# Patient Record
Sex: Female | Born: 1950 | Race: White | Hispanic: No | Smoking: Never smoker
Health system: Southern US, Community
[De-identification: ages and names within clinical notes are randomized; demographics above are authoritative.]

## PROBLEM LIST (undated history)

## (undated) DIAGNOSIS — G4730 Sleep apnea, unspecified: Secondary | ICD-10-CM

## (undated) DIAGNOSIS — F329 Major depressive disorder, single episode, unspecified: Secondary | ICD-10-CM

## (undated) DIAGNOSIS — K5900 Constipation, unspecified: Secondary | ICD-10-CM

## (undated) DIAGNOSIS — I10 Essential (primary) hypertension: Secondary | ICD-10-CM

## (undated) DIAGNOSIS — M1990 Unspecified osteoarthritis, unspecified site: Secondary | ICD-10-CM

## (undated) DIAGNOSIS — Z860101 Personal history of adenomatous and serrated colon polyps: Secondary | ICD-10-CM

## (undated) DIAGNOSIS — F32A Depression, unspecified: Secondary | ICD-10-CM

## (undated) DIAGNOSIS — F419 Anxiety disorder, unspecified: Secondary | ICD-10-CM

## (undated) DIAGNOSIS — R569 Unspecified convulsions: Secondary | ICD-10-CM

## (undated) DIAGNOSIS — E7800 Pure hypercholesterolemia, unspecified: Secondary | ICD-10-CM

## (undated) DIAGNOSIS — Z86010 Personal history of colonic polyps: Secondary | ICD-10-CM

## (undated) DIAGNOSIS — T7840XA Allergy, unspecified, initial encounter: Secondary | ICD-10-CM

## (undated) DIAGNOSIS — I499 Cardiac arrhythmia, unspecified: Secondary | ICD-10-CM

## (undated) DIAGNOSIS — E039 Hypothyroidism, unspecified: Secondary | ICD-10-CM

## (undated) DIAGNOSIS — K829 Disease of gallbladder, unspecified: Secondary | ICD-10-CM

## (undated) DIAGNOSIS — M2550 Pain in unspecified joint: Secondary | ICD-10-CM

## (undated) DIAGNOSIS — K219 Gastro-esophageal reflux disease without esophagitis: Secondary | ICD-10-CM

## (undated) DIAGNOSIS — G4761 Periodic limb movement disorder: Secondary | ICD-10-CM

## (undated) DIAGNOSIS — R7303 Prediabetes: Secondary | ICD-10-CM

## (undated) DIAGNOSIS — E559 Vitamin D deficiency, unspecified: Secondary | ICD-10-CM

## (undated) DIAGNOSIS — E785 Hyperlipidemia, unspecified: Secondary | ICD-10-CM

## (undated) HISTORY — DX: Pain in unspecified joint: M25.50

## (undated) HISTORY — DX: Gastro-esophageal reflux disease without esophagitis: K21.9

## (undated) HISTORY — DX: Hypothyroidism, unspecified: E03.9

## (undated) HISTORY — DX: Periodic limb movement disorder: G47.61

## (undated) HISTORY — DX: Sleep apnea, unspecified: G47.30

## (undated) HISTORY — DX: Major depressive disorder, single episode, unspecified: F32.9

## (undated) HISTORY — DX: Unspecified osteoarthritis, unspecified site: M19.90

## (undated) HISTORY — DX: Personal history of colonic polyps: Z86.010

## (undated) HISTORY — DX: Disease of gallbladder, unspecified: K82.9

## (undated) HISTORY — PX: DILATION AND CURETTAGE OF UTERUS: SHX78

## (undated) HISTORY — DX: Personal history of adenomatous and serrated colon polyps: Z86.0101

## (undated) HISTORY — DX: Prediabetes: R73.03

## (undated) HISTORY — DX: Allergy, unspecified, initial encounter: T78.40XA

## (undated) HISTORY — DX: Constipation, unspecified: K59.00

## (undated) HISTORY — DX: Vitamin D deficiency, unspecified: E55.9

## (undated) HISTORY — PX: JOINT REPLACEMENT: SHX530

## (undated) HISTORY — DX: Hyperlipidemia, unspecified: E78.5

## (undated) HISTORY — PX: APPENDECTOMY: SHX54

## (undated) HISTORY — DX: Unspecified convulsions: R56.9

## (undated) HISTORY — PX: COLONOSCOPY: SHX174

## (undated) HISTORY — DX: Depression, unspecified: F32.A

## (undated) HISTORY — DX: Anxiety disorder, unspecified: F41.9

## (undated) HISTORY — PX: OSTEOTOMY PROXIMAL FEMORAL: SUR986

---

## 1976-01-08 HISTORY — PX: CHOLECYSTECTOMY: SHX55

## 1996-01-08 HISTORY — PX: JOINT REPLACEMENT: SHX530

## 1997-07-04 ENCOUNTER — Ambulatory Visit (HOSPITAL_COMMUNITY): Admission: RE | Admit: 1997-07-04 | Discharge: 1997-07-04 | Payer: Self-pay | Admitting: Orthopaedic Surgery

## 1997-12-28 ENCOUNTER — Ambulatory Visit (HOSPITAL_COMMUNITY): Admission: RE | Admit: 1997-12-28 | Discharge: 1997-12-28 | Payer: Self-pay | Admitting: Orthopedic Surgery

## 2002-08-02 ENCOUNTER — Encounter (INDEPENDENT_AMBULATORY_CARE_PROVIDER_SITE_OTHER): Payer: Self-pay

## 2002-08-02 ENCOUNTER — Ambulatory Visit (HOSPITAL_COMMUNITY): Admission: RE | Admit: 2002-08-02 | Discharge: 2002-08-02 | Payer: Self-pay | Admitting: *Deleted

## 2005-12-27 ENCOUNTER — Observation Stay (HOSPITAL_COMMUNITY): Admission: AD | Admit: 2005-12-27 | Discharge: 2005-12-28 | Payer: Self-pay | Admitting: Family Medicine

## 2005-12-27 ENCOUNTER — Ambulatory Visit: Payer: Self-pay | Admitting: Family Medicine

## 2006-07-21 ENCOUNTER — Encounter (INDEPENDENT_AMBULATORY_CARE_PROVIDER_SITE_OTHER): Payer: Self-pay | Admitting: *Deleted

## 2006-07-21 ENCOUNTER — Ambulatory Visit (HOSPITAL_COMMUNITY): Admission: RE | Admit: 2006-07-21 | Discharge: 2006-07-21 | Payer: Self-pay | Admitting: *Deleted

## 2008-05-16 ENCOUNTER — Encounter: Payer: Self-pay | Admitting: Cardiology

## 2008-06-04 ENCOUNTER — Encounter: Payer: Self-pay | Admitting: Cardiology

## 2008-07-04 ENCOUNTER — Ambulatory Visit: Payer: Self-pay | Admitting: Cardiology

## 2008-07-04 DIAGNOSIS — M7120 Synovial cyst of popliteal space [Baker], unspecified knee: Secondary | ICD-10-CM | POA: Insufficient documentation

## 2008-07-04 DIAGNOSIS — E785 Hyperlipidemia, unspecified: Secondary | ICD-10-CM | POA: Insufficient documentation

## 2008-07-04 DIAGNOSIS — I10 Essential (primary) hypertension: Secondary | ICD-10-CM | POA: Insufficient documentation

## 2008-07-04 DIAGNOSIS — R9431 Abnormal electrocardiogram [ECG] [EKG]: Secondary | ICD-10-CM | POA: Insufficient documentation

## 2009-02-14 ENCOUNTER — Encounter: Admission: RE | Admit: 2009-02-14 | Discharge: 2009-02-14 | Payer: Self-pay | Admitting: Emergency Medicine

## 2010-05-22 NOTE — Op Note (Signed)
Jennifer [name], Jennifer [name]         ACCOUNT NO.:  [id number]   MEDICAL RECORD NO.:  [id number]          PATIENT TYPE:  AMB   LOCATION:  ENDO                         FACILITY:  [facility]   PHYSICIAN:  Georgiana Spinner, M.D.    DATE OF BIRTH:  01-Nov-1950   DATE OF PROCEDURE:  07/21/2006  DATE OF DISCHARGE:                               OPERATIVE REPORT   PROCEDURE:  Colonoscopy.   ANESTHESIA:  Fentanyl 100 mcg, Versed 8 mg.   DESCRIPTION OF PROCEDURE:  With the patient mildly sedated in the left  lateral decubitus position,  the Pentax videoscopic colonoscope was  inserted in the rectum and passed under direct vision to the cecum,  identified by ileocecal valve and appendiceal orifice, both of which  were photographed from this point.  Colonoscope was slowly withdrawn  taking circumferential views of the colonic mucosa, stopping only first  at the ascending colon where a small polyp was seen, photographed and  removed using snare cautery technique setting of 20/150 blended current.  Tissue was retrieved by suctioning into the endoscope through the tissue  trap.  We next stopped the hepatic flexure and transverse colon side  where two small polyps were seen.  One was removed using snare cautery  technique.  The other with hot biopsy forceps technique with the same  previous setting.  All tissue was retrieved for pathology.  We next  stopped at 40 cm from anal verge at which point two more polyps were  seen and one was removed using snare cautery technique setting with hot  biopsy forceps technique, again with the same setting.  All tissue was  again retrieved and placed into the third specimen container.  Then we  withdrew all the way to the rectum which appeared normal.  Showed  hemorrhoids on retroflexed view.  The endoscope was straightened and  withdrawn.  The patient's vital signs, pulse oximeter remained stable.  The patient tolerated the procedure well without apparent  complications.   FINDINGS:  Two polyps at 40 cm from anal verge, two polyps at the  hepatic flexure and transverse colon side and a polyp of the ascending  colon, all removed.  Await biopsy report.  The patient will call me for  results and follow-up with me as an outpatient.           ______________________________  Georgiana Spinner, M.D.     GMO/MEDQ  D:  07/21/2006  T:  07/21/2006  Job:  [id number]   cc:   Brett Canales A. Cleta Alberts, M.D.  Fax: [phone]

## 2010-05-25 NOTE — H&P (Signed)
NAMEMACKENSI, MAHADEO         ACCOUNT NO.:  [id number]   MEDICAL RECORD NO.:  [id number]          PATIENT TYPE:  INP   LOCATION:  5708                         FACILITY:  [facility]   PHYSICIAN:  Rolm Gala, M.D.    DATE OF BIRTH:  [birth year]   DATE OF ADMISSION:  12/27/2005  DATE OF DISCHARGE:                              HISTORY & PHYSICAL   No dictation for this report.      Rolm Gala, M.D.     Bennetta Laos  D:  12/27/2005  T:  12/28/2005  Job:  [id number]

## 2010-05-25 NOTE — Op Note (Signed)
   NAME:  Jennifer [name], Jennifer [name]                   ACCOUNT NO.:  [id number]   MEDICAL RECORD NO.:  [id number]                   PATIENT TYPE:  AMB   LOCATION:  ENDO                                 FACILITY:  [facility]   PHYSICIAN:  Georgiana Spinner, M.D.                 DATE OF BIRTH:  07-Nov-1950   DATE OF PROCEDURE:  DATE OF DISCHARGE:                                 OPERATIVE REPORT   PROCEDURE:  Colonoscopy with polypectomy.   INDICATION:  Polyp.   ANESTHESIA:  Demerol 75 mg, Versed 8 mg.   DESCRIPTION OF PROCEDURE:  With the patient mildly sedated in the left  lateral decubitus position, the Olympus videoscopic colonoscope was inserted  in the rectum and passed under direct vision to the cecum identified by the  ileocecal valve and appendiceal orifice, both of which were photographed.  From this point, the colonoscope was slowly withdrawn, taking  circumferential views of the colonic mucosa, stopping to photograph an AVM  that was small in two to three folds removed from the ileocecal valve and a  second AVM was seen in the sigmoid colon and this was photographed as well.  Also, in the descending colon, there was a polyp seen, it was photographed  and it was removed using the Argon photocoagulator.  Tissue was slowly  retrieved for pathology and hemostasis was noted.  There was no bleeding  seen and the endoscope was pulled all the way back to the rectum which  appeared normal except for internal hemorrhoids.  On retroflex view, the  endoscope was straightened and withdrawn.  The patient's vital signs and  pulse oximetry remained stable and the patient tolerated the procedure well  without apparent complications.   FINDINGS:  1. 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  2. Polyp of the descending colon, removed.  3. Internal hemorrhoids.   PLAN:  1. Await pathology report and the patient will call me for the results and    follow up with me as an outpatient.  2. Avoid aspirin for two weeks.                                               Georgiana Spinner, M.D.    GMO/MEDQ  D:  08/02/2002  T:  08/02/2002  Job:  [id number]   cc:   Brett Canales A. Cleta Alberts, M.D.  [address]  [place]  [place] [address]  Fax: [phone]

## 2010-05-25 NOTE — Discharge Summary (Signed)
Jennifer [name], Jennifer [name]         ACCOUNT NO.:  [id number]   MEDICAL RECORD NO.:  [id number]          PATIENT TYPE:  INP   LOCATION:  5708                         FACILITY:  [facility]   PHYSICIAN:  Norton Blizzard, M.D.    DATE OF BIRTH:  1950-10-23   DATE OF ADMISSION:  12/27/2005  DATE OF DISCHARGE:  12/28/2005                               DISCHARGE SUMMARY   DISCHARGE DIAGNOSES:  1. Gastroenteritis.  2. Dehydration.  3. Hypocalcemia.  4. Hypercholesterolemia.  5. Depression.  6. Hypokalemia.  7. Mild anemia.   CONSULTATIONS:  None.   PROCEDURES:  None.   DISCHARGE LABS:  Rotavirus negative.  Lipase 18, potassium 3.2, sodium  137, chloride 109, bicarb 23, glucose 89, BUN 16, creatinine 0.6,  albumin 2.3, calcium 7.4, phosphorous 2.7.  White blood cell count 8,  hemoglobin 11.8, platelets 208.  MCV was 89.   HOSPITAL COURSE:  1. Nausea and vomiting likely secondary to gastroenteritis from the      chicken salad that she had eaten the night before.  She had      persistent nausea and vomiting and diarrhea on day of admit, and      was then brought to the hospital.  She did not have any bloody      stool or emesis.  She did have abdominal pain but was afebrile,      with a T-max of 100.2.  She was made n.p.o.  We advanced her diet      as tolerated to clears on discharge, and she was tolerating this      without nausea.  She was given IV fluids, 3 liters at outside      hospital, and 4 liters here and we continued IV fluids here.  She      was given Zofran and Phenergan p.r.n..  2. Dehydration secondary to #1.  She is status post 4 liters of      lactated Ringer and then was started on 200 cc per hour of this      fluid.  She was tachycardic on discharge but was tolerating p.o.      without any difficulty.  3. Hypocalcemia.  This is correct for albumin, so do not feel there is      any further workup necessary.  Would consider working up her low      albumin, however.  4.  Dyslipidemia and depression.  We held her Vytorin and Lexapro while      in the hospital due to emesis and she was able to start this on      discharge.   DISCHARGE MEDICATIONS AND INSTRUCTIONS:  1. Phenergan 25 mg p.o. q.6h. p.r.n. nausea.  2. Lexapro 40 mg p.o. daily.  3. Vytorin 40 mg p.o. daily.   Patient is to return if she cannot keep any food or liquids down at all,  if she develops new abdominal pain, fevers, or any other concerns.   DISCHARGE CONDITION:  Good, stable.   FOLLOWUP:  Patient will follow up with Dr. Juline Patch, her primary care  physician, as needed.   ISSUES FOR FOLLOWUP:  1. Hypokalemia  likely secondary to emesis but would recheck a BMP to      evaluate.  2. Anemia.  This is slightly low with a normal MCV.  Consider whether      this needs to be followed up as well.  3. Consider why her albumin is low.           ______________________________  Norton Blizzard, M.D.     SH/MEDQ  D:  12/29/2005  T:  12/30/2005  Job:  [id number]   cc:   Dr. Juline Patch.

## 2010-11-26 ENCOUNTER — Encounter (HOSPITAL_COMMUNITY): Payer: Self-pay | Admitting: *Deleted

## 2010-12-04 ENCOUNTER — Encounter (HOSPITAL_COMMUNITY): Payer: Self-pay | Admitting: Pharmacist

## 2010-12-10 ENCOUNTER — Other Ambulatory Visit: Payer: Self-pay | Admitting: Obstetrics & Gynecology

## 2010-12-17 MED ORDER — GENTAMICIN SULFATE 40 MG/ML IJ SOLN
INTRAVENOUS | Status: DC
  Filled 2010-12-17: qty 2.75

## 2010-12-17 MED ORDER — GENTAMICIN SULFATE 40 MG/ML IJ SOLN: INTRAVENOUS | Status: DC

## 2010-12-17 NOTE — H&P (Addendum)
Jennifer [name] is an 60 y.o. female. G1P1. Menopausalx 2 yrs, now with irreg bleeding and sono noted large submucosal myoma besides a few intramural myoma.  Plan to proceed with more conservative surgery with hysteroscopic myomectomy rather than hysterectomy. Endometrial bx is benign. Pap in 2010 nl, all nl in past.  Mammogram in 2010 nl, plans to repeat after current problem resolved.   Medical Hx:  Depression Dyslipidemia Obesity  Past Surgical History  Procedure Date  . Joint replacement 1998    right knee  . Cholecystectomy 1978  . Cesarean section   Appendicectomy  Family Hx:  Stomach Ca, lung ca, HTN, DM, CAD.  Social History:  reports that she has never smoked. She does not have any smokeless tobacco history on file. She reports that she does not drink alcohol or use illicit drugs.  Allergies:  Allergies  Allergen Reactions  . Triple Antibiotic Swelling    Ophthalmic only(eye swelling)  . Codeine Rash  . Penicillins Rash  . Piroxicam Rash  . Sulfonamide Derivatives Rash    No prescriptions prior to admission    Review of Systems  Constitutional: Negative for fever.  Respiratory: Negative for cough.   Cardiovascular: Negative for chest pain.  Gastrointestinal: Negative for constipation.  Neurological: Negative for headaches.  Psychiatric/Behavioral: Negative for depression.    Height 5\' 3"  (1.6 m), weight 162 lb (73.483 kg). Physical Exam   A&O x 3, no acute distress. Pleasant HEENT neg, no thyromegaly Lungs CTA bilat CV RRR, S1S2 normal Abdo soft, non tender, non acute Extr no edema/ tenderness Pelvic in office - fibroid uterus. Normal ovaries on sono.   No results found for this or any previous visit (from the past 24 hour(s)).  No results found.  Assessment/Plan: Postmenopausal bleeding, submucosal myoma. Here for hysteroscopic resection. Risks/complications including infection/ bleeding/ uterine perforation/ incomplete resection/ fluid  retention etc reviewed, patient understands and agrees.   Danila Eddie R 12/17/2010, 5:04 PM  Update on H&P-->  Pt evaluated in pre-op, H&P reviewed, no changes. Surgery plan discussed, understands and agrees.  V.Lavender Stanke, MD

## 2010-12-18 ENCOUNTER — Encounter (HOSPITAL_COMMUNITY)
Admission: RE | Disposition: A | Discharge status: Home / Self Care | Payer: Self-pay | Source: Ambulatory Visit | Attending: Obstetrics & Gynecology

## 2010-12-18 ENCOUNTER — Encounter (HOSPITAL_COMMUNITY): Payer: Self-pay | Admitting: *Deleted

## 2010-12-18 ENCOUNTER — Ambulatory Visit (HOSPITAL_COMMUNITY)
Admission: RE | Admit: 2010-12-18 | Discharge: 2010-12-18 | Disposition: A | Discharge status: Home / Self Care | Payer: BC Managed Care – PPO | Source: Ambulatory Visit | Attending: Obstetrics & Gynecology | Admitting: Obstetrics & Gynecology

## 2010-12-18 ENCOUNTER — Other Ambulatory Visit: Payer: Self-pay | Admitting: Obstetrics & Gynecology

## 2010-12-18 ENCOUNTER — Encounter (HOSPITAL_COMMUNITY): Payer: Self-pay | Admitting: Certified Registered Nurse Anesthetist

## 2010-12-18 ENCOUNTER — Ambulatory Visit (HOSPITAL_COMMUNITY): Payer: BC Managed Care – PPO | Admitting: Certified Registered Nurse Anesthetist

## 2010-12-18 DIAGNOSIS — D250 Submucous leiomyoma of uterus: Secondary | ICD-10-CM | POA: Diagnosis present

## 2010-12-18 DIAGNOSIS — N950 Postmenopausal bleeding: Secondary | ICD-10-CM | POA: Insufficient documentation

## 2010-12-18 LAB — BASIC METABOLIC PANEL
BUN: 14 mg/dL (ref 6–23)
GFR calc Af Amer: >90 mL/min (ref >90)
GFR calc non Af Amer: >90 mL/min (ref >90)
Sodium: 136 mEq/L (ref 135–145)

## 2010-12-18 LAB — CBC
HCT: 38.7 % (ref 36.0–46.0)
MCV: 88.8 fL (ref 78.0–100.0)
RBC: 4.36 MIL/uL (ref 3.87–5.11)

## 2010-12-18 SURGERY — DILATATION & CURETTAGE/HYSTEROSCOPY WITH VERSAPOINT RESECTION
Anesthesia: General | Site: Uterus | Wound class: Clean Contaminated

## 2010-12-18 MED ORDER — DEXAMETHASONE SODIUM PHOSPHATE 10 MG/ML IJ SOLN
INTRAMUSCULAR | Status: DC | PRN
  Administered 2010-12-18: 10 mg via INTRAVENOUS

## 2010-12-18 MED ORDER — PROPOFOL 10 MG/ML IV EMUL
INTRAVENOUS | Status: DC | PRN
  Administered 2010-12-18: 200 mg via INTRAVENOUS

## 2010-12-18 MED ORDER — ONDANSETRON HCL 4 MG/2ML IJ SOLN
INTRAMUSCULAR | Status: DC | PRN
  Administered 2010-12-18: 4 mg via INTRAVENOUS

## 2010-12-18 MED ORDER — EPHEDRINE SULFATE 50 MG/ML IJ SOLN
INTRAMUSCULAR | Status: DC | PRN
  Administered 2010-12-18 (×3): 10 mg via INTRAVENOUS

## 2010-12-18 MED ORDER — SODIUM CHLORIDE 0.9 % IR SOLN
Status: DC | PRN
  Administered 2010-12-18 (×4): 3000 mL

## 2010-12-18 MED ORDER — CLINDAMYCIN PHOSPHATE 600 MG/50ML IV SOLN: INTRAVENOUS | Status: DC | PRN

## 2010-12-18 MED ORDER — TRAMADOL HCL 50 MG PO TABS: ORAL_TABLET | ORAL | Status: DC

## 2010-12-18 MED ORDER — LIDOCAINE HCL 1 % IJ SOLN
INTRAMUSCULAR | Status: DC | PRN
  Administered 2010-12-18: 20 mL

## 2010-12-18 MED ORDER — KETOROLAC TROMETHAMINE 30 MG/ML IJ SOLN
INTRAMUSCULAR | Status: DC | PRN
  Administered 2010-12-18: 30 mg via INTRAVENOUS

## 2010-12-18 MED ORDER — FENTANYL CITRATE 0.05 MG/ML IJ SOLN
INTRAMUSCULAR | Status: DC | PRN
  Administered 2010-12-18: 50 ug via INTRAVENOUS

## 2010-12-18 MED ORDER — LIDOCAINE HCL (CARDIAC) 20 MG/ML IV SOLN
INTRAVENOUS | Status: DC | PRN
  Administered 2010-12-18: 100 mg via INTRAVENOUS

## 2010-12-18 MED ORDER — GENTAMICIN SULFATE 40 MG/ML IJ SOLN
INTRAVENOUS | Status: DC | PRN
  Administered 2010-12-18: 100 mL via INTRAVENOUS

## 2010-12-18 MED ORDER — LACTATED RINGERS IV SOLN
INTRAVENOUS | Status: DC
  Administered 2010-12-18 (×2): via INTRAVENOUS

## 2010-12-18 SURGICAL SUPPLY — 16 items
CANISTER SUCTION 2500CC (MISCELLANEOUS) ×11 IMPLANT
CATH ROBINSON RED A/P 16FR (CATHETERS) ×3 IMPLANT
CLOTH BEACON ORANGE TIMEOUT ST (SAFETY) ×3 IMPLANT
CONTAINER PREFILL 10% NBF 60ML (FORM) ×4 IMPLANT
ELECT REM PT RETURN 9FT ADLT (ELECTROSURGICAL) ×3
ELECTRODE REM PT RTRN 9FT ADLT (ELECTROSURGICAL) ×2 IMPLANT
ELECTRODE ROLLER VERSAPOINT (ELECTRODE) ×2 IMPLANT
ELECTRODE RT ANGLE VERSAPOINT (CUTTING LOOP) IMPLANT
GLOVE BIO SURGEON STRL SZ7 (GLOVE) ×6 IMPLANT
GLOVE BIOGEL PI IND STRL 7.0 (GLOVE) ×2 IMPLANT
GLOVE BIOGEL PI INDICATOR 7.0 (GLOVE) ×1
GOWN PREVENTION PLUS LG XLONG (DISPOSABLE) ×4 IMPLANT
GOWN STRL REIN XL XLG (GOWN DISPOSABLE) ×3 IMPLANT
PACK HYSTEROSCOPY LF (CUSTOM PROCEDURE TRAY) ×3 IMPLANT
TOWEL OR 17X24 6PK STRL BLUE (TOWEL DISPOSABLE) ×6 IMPLANT
WATER STERILE IRR 1000ML POUR (IV SOLUTION) ×3 IMPLANT

## 2010-12-18 NOTE — Transfer of Care (Signed)
Immediate Anesthesia Transfer of Care Note  Patient: Jennifer [name]  Procedure(s) Performed:  DILATATION & CURETTAGE/HYSTEROSCOPY WITH VERSAPOINT RESECTION  Patient Location: PACU  Anesthesia Type: General  Level of Consciousness: awake, alert  and oriented  Airway & Oxygen Therapy: Patient Spontanous Breathing  Post-op Assessment: Report given to PACU RN  Post vital signs: Reviewed and stable  Complications: No apparent anesthesia complications

## 2010-12-18 NOTE — Anesthesia Postprocedure Evaluation (Signed)
  Anesthesia Post-op Note  Patient: Jennifer [name]  Procedure(s) Performed:  DILATATION & CURETTAGE/HYSTEROSCOPY WITH VERSAPOINT RESECTION  Patient Location: PACU  Anesthesia Type: General  Level of Consciousness: awake, alert  and oriented  Airway and Oxygen Therapy: Patient Spontanous Breathing  Post-op Pain: none  Post-op Assessment: Post-op Vital signs reviewed, Patient's Cardiovascular Status Stable, Respiratory Function Stable, Patent Airway, No signs of Nausea or vomiting, Adequate PO intake and Pain level controlled  Post-op Vital Signs: Reviewed and stable  Complications: No apparent anesthesia complications

## 2010-12-18 NOTE — Op Note (Signed)
Preoperative diagnosis: Submucosal myoma, postmenopausal bleeding.  Postop diagnosis: as above.  Procedure: Hysteroscopic resection of submucosal myoma with Versapoint vaporizer.  Anesthesia: General Surgeon: Shea Evans, MD  Assistant:  none IV fluids: LR 1500 cc  Estimated blood loss 50 cc Saline (hysteroscopic irrigation) deficit : 515 cc   Urine output: straight catheter preop   Complications none  Condition stable  Disposition PACU  Specimen: few chips of vaporized myoma.   Procedure  Indication: Postmenopausal bleeding, fibroid uterus with large 3.5 cm submucosal myoma. Endometrial biopsy was benign in office.  Patient was counseled on risks/ complications including infection, bleeding, damage to internal organs, fluid deficit with intravascular absorption and need to terminate procedure with incomplete resection. she understood and agrees, gave informed written consent.  Patient was brought to the operating room with IV running. Time out was carried out. She received preop Gentamicin and Clindamycin. She underwent general anesthesia without complications. She was given dorsolithotomy position. Parts were prepped and draped in standard fashion. Bladder was catheterized once. Bimanual exam revealed uterus to be retroverted and 10 wks, irregular from fibroids. Speculum was placed and cervix was grasped with single-tooth tenaculum. Cervical block with 20 cc 1% plain Xylocaine given. The uterus was sounded to 10 cm. Cervical os was dilated to 27 [place] dilator. Versapoint Hysteroscope was introduced in the uterine cavity under vision, using Saline for irrigation.  Findings: Large 3.5 cm submucosal myoma noted filling the entire uterine cavity arising from right lateral/fundal aspect. Endometrium appeared normal.  Hysteroscopic vaporization of myoma was performed with Versapoint using vaporising loop in stepwise fashion. Few chips of myoma were removed and sent to pathology. Hysteroscopy at  the end revealed normal endometrial cavity and normal tubal ostii. Bleeding vessels at myoma base were cauterized with excellent hemostasis noted under low pressure.  Fluid deficit 515 cc of normal saline. All instruments were removed and cavity was emptied of chips with gentle curettage.   All counts are correct x2. No complications. Patient was made supine, reversed from anesthesia and brought to the recovery room in stable condition.  Patient will be discharged home today. Discharge meds Tramadol. Follow up in 2 weeks in office. Warning signs of infection and excessive bleeding reviewed.   V.Sharvi Mooneyhan, MD.

## 2010-12-18 NOTE — Anesthesia Preprocedure Evaluation (Signed)
Anesthesia Evaluation  Patient identified by MRN, date of birth, ID band Patient awake    Reviewed: Allergy & Precautions, H&P , NPO status , Patient's Chart, lab work & pertinent test results  Airway Mallampati: III TM Distance: >3 FB Neck ROM: full    Dental No notable dental hx. (+) Teeth Intact   Pulmonary neg pulmonary ROS,  clear to auscultation  Pulmonary exam normal       Cardiovascular hypertension, neg cardio ROS regular Normal    Neuro/Psych Negative Neurological ROS  Negative Psych ROS   GI/Hepatic negative GI ROS, Neg liver ROS,   Endo/Other  Negative Endocrine ROS  Renal/GU negative Renal ROS  Genitourinary negative   Musculoskeletal negative musculoskeletal ROS (+)   Abdominal Normal abdominal exam  (+)   Peds  Hematology negative hematology ROS (+)   Anesthesia Other Findings   Reproductive/Obstetrics negative OB ROS                           Anesthesia Physical Anesthesia Plan  ASA: II  Anesthesia Plan: General LMA and General   Post-op Pain Management:    Induction:   Airway Management Planned:   Additional Equipment:   Intra-op Plan:   Post-operative Plan:   Informed Consent: I have reviewed the patients History and Physical, chart, labs and discussed the procedure including the risks, benefits and alternatives for the proposed anesthesia with the patient or authorized representative who has indicated his/her understanding and acceptance.   Dental Advisory Given  Plan Discussed with: Anesthesiologist, CRNA and Surgeon  Anesthesia Plan Comments:         Anesthesia Quick Evaluation

## 2010-12-18 NOTE — Anesthesia Procedure Notes (Addendum)
Performed by: [employer], Akylah Hascall MARIE   Procedure Name: LMA Insertion Date/Time: 12/18/2010 10:07 AM Performed by: Roni Friberg MARIE Pre-anesthesia Checklist: Patient identified, Patient being monitored, Emergency Drugs available and Suction available Patient Re-evaluated:Patient Re-evaluated prior to inductionOxygen Delivery Method: Circle System Utilized Preoxygenation: Pre-oxygenation with 100% oxygen Intubation Type: IV induction LMA: LMA inserted LMA Size: 4.0 Number of attempts: 1 Placement Confirmation: positive ETCO2 and breath sounds checked- equal and bilateral

## 2011-06-18 ENCOUNTER — Encounter: Payer: Self-pay | Admitting: Emergency Medicine

## 2011-06-18 ENCOUNTER — Ambulatory Visit (INDEPENDENT_AMBULATORY_CARE_PROVIDER_SITE_OTHER): Payer: BC Managed Care – PPO | Admitting: Emergency Medicine

## 2011-06-18 VITALS — BP 142/85 | HR 72 | Temp 97.0°F | Resp 16 | Ht 64.0 in | Wt 172.0 lb

## 2011-06-18 DIAGNOSIS — K130 Diseases of lips: Secondary | ICD-10-CM

## 2011-06-18 DIAGNOSIS — F329 Major depressive disorder, single episode, unspecified: Secondary | ICD-10-CM

## 2011-06-18 DIAGNOSIS — F32A Depression, unspecified: Secondary | ICD-10-CM

## 2011-06-18 DIAGNOSIS — Z23 Encounter for immunization: Secondary | ICD-10-CM

## 2011-06-18 DIAGNOSIS — E785 Hyperlipidemia, unspecified: Secondary | ICD-10-CM

## 2011-06-18 DIAGNOSIS — E782 Mixed hyperlipidemia: Secondary | ICD-10-CM

## 2011-06-18 DIAGNOSIS — Z0000 Encounter for general adult medical examination without abnormal findings: Secondary | ICD-10-CM

## 2011-06-18 DIAGNOSIS — E559 Vitamin D deficiency, unspecified: Secondary | ICD-10-CM

## 2011-06-18 LAB — CBC WITH DIFFERENTIAL/PLATELET
Basophils Absolute: 0 10*3/uL (ref 0.0–0.1)
Basophils Relative: 1 % (ref 0–1)
Eosinophils Absolute: 0.1 10*3/uL (ref 0.0–0.7)
MCH: 30.5 pg (ref 26.0–34.0)
MCHC: 34.5 g/dL (ref 30.0–36.0)
Neutrophils Relative %: 69 % (ref 43–77)
Platelets: 295 10*3/uL (ref 150–400)
RDW: 13.1 % (ref 11.5–15.5)

## 2011-06-18 LAB — POCT UA - MICROSCOPIC ONLY: Crystals, Ur, HPF, POC: NEGATIVE

## 2011-06-18 LAB — COMPREHENSIVE METABOLIC PANEL
ALT: 17 U/L (ref 0–35)
Alkaline Phosphatase: 82 U/L (ref 39–117)
Glucose, Bld: 81 mg/dL (ref 70–99)
Sodium: 141 mEq/L (ref 135–145)
Total Bilirubin: 0.7 mg/dL (ref 0.3–1.2)
Total Protein: 7.3 g/dL (ref 6.0–8.3)

## 2011-06-18 LAB — LIPID PANEL: HDL: 40 mg/dL (ref >39)

## 2011-06-18 LAB — TSH: TSH: 2.894 u[IU]/mL (ref 0.350–4.500)

## 2011-06-18 LAB — POCT URINALYSIS DIPSTICK
Ketones, UA: NEGATIVE
Leukocytes, UA: NEGATIVE
Nitrite, UA: NEGATIVE
Protein, UA: NEGATIVE

## 2011-06-18 MED ORDER — BETAMETHASONE DIPROPIONATE 0.05 % EX CREA: TOPICAL_CREAM | Freq: Two times a day (BID) | CUTANEOUS | Status: DC

## 2011-06-18 MED ORDER — TETANUS-DIPHTH-ACELL PERTUSSIS 5-2.5-18.5 LF-MCG/0.5 IM SUSP: 0.5000 mL | Freq: Once | INTRAMUSCULAR | Status: DC

## 2011-06-18 NOTE — Progress Notes (Signed)
@[email or web address] @  Patient ID: Jennifer [name] MRN: [id number], DOB: Mar 14, 1950, 61 y.o. Date of Encounter: 06/18/2011, 9:53 AM  Primary Physician: No primary provider on file.  Chief Complaint: Physical (CPE)  HPI: 61 y.o. y/o female with history of noted below here for CPE.  Doing well. No issues/complaints.  LMP:  Pap: MMG: Review of Systems:  Consitutional: No fever, chills, fatigue, night sweats, lymphadenopathy, or weight changes. Eyes: No visual changes, eye redness, or discharge. ENT/Mouth: Ears: No otalgia, tinnitus, hearing loss, discharge. Nose: No congestion, rhinorrhea, sinus pain, or epistaxis. Throat: No sore throat, post nasal drip, or teeth pain. Cardiovascular: No CP, palpitations, diaphoresis, DOE, edema, orthopnea, PND. Respiratory: She has a history of exercise-induced asthma she has Dulera to take but rarely takes it. As a pro-air inhaler to use as  Gastrointestinal: No anorexia, dysphagia, reflux, pain, nausea, vomiting, hematemesis, diarrhea, constipation, BRBPR, or melena. Breast: No discharge, pain, swelling, or mass. Genitourinary: No dysuria, frequency, urgency, hematuria, incontinence, nocturia, amenorrhea, vaginal discharge, pruritis, burning, abnormal bleeding, or pain. Musculoskeletal: No decreased ROM, myalgias, stiffness, joint swelling, or weakness. Skin: Patient has a sore area on her lower lip as well as a rash beneath the panniculus.  Neurological: No headache, dizziness, syncope, seizures, tremors, memory loss, coordination problems, or paresthesias. Psychological: No anxiety, depression, hallucinations, SI/HI. Endocrine: No fatigue, polydipsia, polyphagia, polyuria, or known diabetes. All other systems were reviewed and are otherwise negative.  No past medical history on file.   Past Surgical History  Procedure Date  . Joint replacement 1998    right knee  . Cholecystectomy 1978  . Cesarean section     Home Meds:  Prior to Admission  medications   Medication Sig Start Date End Date Taking? Authorizing Provider  b complex vitamins tablet Take 1 tablet by mouth daily.     Yes Historical Provider, MD  cetirizine (ZYRTEC) 10 MG tablet Take 10 mg by mouth at bedtime.     Yes Historical Provider, MD  citalopram (CELEXA) 40 MG tablet Take 40 mg by mouth daily.     Yes Historical Provider, MD  Multiple Vitamins-Minerals (MULTIVITAMINS THER. W/MINERALS) TABS Take 1 tablet by mouth daily.     Yes Historical Provider, MD  pravastatin (PRAVACHOL) 40 MG tablet Take 80 mg by mouth at bedtime.     Yes Historical Provider, MD  cholecalciferol (VITAMIN D) 1000 UNITS tablet Take 2,000 Units by mouth daily after breakfast.      Historical Provider, MD  co-enzyme Q-10 30 MG capsule Take 30 mg by mouth at bedtime.     Historical Provider, MD  traMADol (ULTRAM) 50 MG tablet Every 6 hrs as needed. Maximum dose= 8 tablets per day 12/18/10   Robley Fries, MD    Allergies:  Allergies  Allergen Reactions  . Crestor (Rosuvastatin)   . Neomycin-Bacitracin Zn-Polymyx Swelling    Ophthalmic only(eye swelling)  . Codeine Rash  . Penicillins Rash  . Piroxicam Rash  . Sulfonamide Derivatives Rash    History   Social History  . Marital Status: Divorced    Spouse Name: N/A    Number of Children: N/A  . Years of Education: N/A   Occupational History  . Not on file.   Social History Main Topics  . Smoking status: Never Smoker   . Smokeless tobacco: Not on file  . Alcohol Use: No  . Drug Use: No  . Sexually Active: No   Other Topics Concern  . Not on file   Social History  Narrative  . No narrative on file    No family history on file.  Physical Exam  Blood pressure 142/85, pulse 72, temperature 97 F (36.1 C), temperature source Oral, resp. rate 16, height 5\' 4"  (1.626 m), weight 172 lb (78.019 kg)., Body mass index is 29.52 kg/(m^2). General: Well developed, well nourished, in no acute distress. HEENT: Normocephalic,  atraumatic. Conjunctiva pink, sclera non-icteric. Pupils 2 mm constricting to 1 mm, round, regular, and equally reactive to light and accomodation. EOMI. Internal auditory canal clear. TMs with good cone of light and without pathology. Nasal mucosa pink. Nares are without discharge. No sinus tenderness. Oral mucosa pink. Dentition normal . Pharynx without exudate.   Neck: Supple. Trachea midline. No thyromegaly. Full ROM. No lymphadenopathy. Lungs: Clear to auscultation bilaterally without wheezes, rales, or rhonchi. Breathing is of normal effort and unlabored. Cardiovascular: RRR with S1 S2. No murmurs, rubs, or gallops appreciated. Distal pulses 2+ symmetrically. No carotid or abdominal bruits.  Breast: Breast examination is normal without mass  Abdomen: Soft, non-tender, non-distended with normoactive bowel sounds. No hepatosplenomegaly or masses. No rebound/guarding. No CVA tenderness. Without hernias.  Genitourinary:     Musculoskeletal: Full range of motion and 5/5 strength throughout. Without swelling, atrophy, tenderness, crepitus, or warmth. Extremities without clubbing, cyanosis, or edema. Calves supple. Skin: Warm and moist without erythema, ecchymosis, wounds, There is a red raw area beneath the panniculus. There is also a red scaly area of right lip at the border of the mucous membrane and skin. Neuro: A+Ox3. CN II-XII grossly intact. Moves all extremities spontaneously. Full sensation throughout. Normal gait. DTR 2+ throughout upper and lower extremities. Finger to nose intact. Psych:  Responds to questions appropriately with a normal affect.        Assessment/Plan:  61 y.o. y/o female here for CPE. She's doing very well plans are to make a referral for evaluation of a lesion on her lip . We'll update her tetanus and also given a prescription for shingles vaccine.  -  Signed, Earl Lites, MD 06/18/2011 9:53 AM

## 2011-06-18 NOTE — Patient Instructions (Signed)

## 2011-06-19 ENCOUNTER — Encounter: Payer: Self-pay | Admitting: Emergency Medicine

## 2011-07-17 ENCOUNTER — Ambulatory Visit (INDEPENDENT_AMBULATORY_CARE_PROVIDER_SITE_OTHER): Payer: BC Managed Care – PPO | Admitting: Cardiology

## 2011-07-17 ENCOUNTER — Encounter: Payer: Self-pay | Admitting: Cardiology

## 2011-07-17 VITALS — BP 120/80 | HR 60 | Ht 65.0 in | Wt 174.8 lb

## 2011-07-17 DIAGNOSIS — R9431 Abnormal electrocardiogram [ECG] [EKG]: Secondary | ICD-10-CM

## 2011-07-17 DIAGNOSIS — E785 Hyperlipidemia, unspecified: Secondary | ICD-10-CM

## 2011-07-17 DIAGNOSIS — I10 Essential (primary) hypertension: Secondary | ICD-10-CM

## 2011-07-17 NOTE — Assessment & Plan Note (Signed)
She has been intolerant of multiple medications is doing well. No change in therapy is indicated.

## 2011-07-17 NOTE — Progress Notes (Signed)
   HPI The patient presents for evaluation of multiple cardiovascular risk factors.  I saw her several years ago for evaluation of atypical chest pain. She has not required cardiovascular testing in the past. She's never had stress testing catheterization with ultrasound. She does have risk factors include dyslipidemia. She was sent here for screening purposes and she does do an active exercise would like some guidance.  The patient denies any new symptoms such as chest discomfort, neck or arm discomfort. There has been no new shortness of breath, PND or orthopnea. There have been no reported palpitations, presyncope or syncope.  Allergies  Allergen Reactions  . Crestor (Rosuvastatin)   . Neomycin-Bacitracin Zn-Polymyx Swelling    Ophthalmic only(eye swelling)  . Codeine Rash  . Penicillins Rash  . Piroxicam Rash  . Sulfonamide Derivatives Rash    Current Outpatient Prescriptions  Medication Sig Dispense Refill  . b complex vitamins tablet Take 1 tablet by mouth daily.        . cetirizine (ZYRTEC) 10 MG tablet Take 10 mg by mouth at bedtime.        . cholecalciferol (VITAMIN D) 1000 UNITS tablet Take 2,000 Units by mouth daily after breakfast.        . citalopram (CELEXA) 40 MG tablet Take 40 mg by mouth daily.        Marland Kitchen co-enzyme Q-10 30 MG capsule Take 30 mg by mouth at bedtime.       . Multiple Vitamins-Minerals (MULTIVITAMINS THER. W/MINERALS) TABS Take 1 tablet by mouth daily.        . pravastatin (PRAVACHOL) 40 MG tablet Take 80 mg by mouth at bedtime.         Current Facility-Administered Medications  Medication Dose Route Frequency Provider Last Rate Last Dose  . TDaP (BOOSTRIX) injection 0.5 mL  0.5 mL Intramuscular Once Collene Gobble, MD        Past Medical History  Diagnosis Date  . Hyperlipidemia   . Osteoarthritis     Past Surgical History  Procedure Date  . Joint replacement 1998    right knee  . Cholecystectomy 1978  . Cesarean section     ROS:  As stated in  the HPI and negative for all other systems.  PHYSICAL EXAM BP 120/80  Pulse 60  Ht 5\' 5"  (1.651 m)  Wt 174 lb 12.8 oz (79.289 kg)  BMI 29.09 kg/m2 GENERAL:  Well appearing HEENT:  Pupils equal round and reactive, fundi not visualized, oral mucosa unremarkable NECK:  No jugular venous distention, waveform within normal limits, carotid upstroke brisk and symmetric, no bruits, no thyromegaly LYMPHATICS:  No cervical, inguinal adenopathy LUNGS:  Clear to auscultation bilaterally BACK:  No CVA tenderness CHEST:  Unremarkable HEART:  PMI not displaced or sustained,S1 and S2 within normal limits, no S3, no S4, no clicks, no rubs, no murmurs ABD:  Flat, positive bowel sounds normal in frequency in pitch, no bruits, no rebound, no guarding, no midline pulsatile mass, no hepatomegaly, no splenomegaly EXT:  2 plus pulses throughout, no edema, no cyanosis no clubbing SKIN:  No rashes no nodules NEURO:  Cranial nerves II through XII grossly intact, motor grossly intact throughout PSYCH:  Cognitively intact, oriented to person place and time  EKG:  Sinus rhythm, rate 60, axis within normal limits, intervals within normal limits, no acute ST-T wave changes.  ASSESSMENT AND PLAN

## 2011-07-17 NOTE — Patient Instructions (Addendum)
The current medical regimen is effective;  continue present plan and medications.  Your physician has requested that you have an exercise tolerance test. For further information please visit [email or web address]. Please also follow instruction sheet, as given.   

## 2011-07-17 NOTE — Assessment & Plan Note (Signed)
The patient's blood pressure is controlled. With her risk factors and her desire to exercise but would like to screen her with an exercise treadmill test. I will bring the patient back for a POET (Plain Old Exercise Test). This will allow me to screen for obstructive coronary disease, risk stratify and very importantly provide a prescription for exercise.

## 2011-07-25 ENCOUNTER — Ambulatory Visit (INDEPENDENT_AMBULATORY_CARE_PROVIDER_SITE_OTHER): Payer: BC Managed Care – PPO | Admitting: Cardiology

## 2011-07-25 ENCOUNTER — Encounter: Payer: Self-pay | Admitting: Cardiology

## 2011-07-25 DIAGNOSIS — R9431 Abnormal electrocardiogram [ECG] [EKG]: Secondary | ICD-10-CM

## 2011-07-25 NOTE — Progress Notes (Signed)
Exercise Treadmill Test  Pre-Exercise Testing Evaluation Rhythm: NSR with Occasional PVC's  Rate: 76   PR:  .11 QRS:  .08  QT:  .39 QTc: .44           Test  Exercise Tolerance Test Ordering MD: Angelina Sheriff, MD  Interpreting MD: Angelina Sheriff, MD  Unique Test No: 1  Treadmill:  1  Indication for ETT: Abnormal EKG  Contraindication to ETT: No   Stress Modality: exercise - treadmill  Cardiac Imaging Performed: non   Protocol: standard Bruce - maximal  Max BP:  174/64  Max MPHR (bpm):  146 85% MPR (bpm):  136  MPHR obtained (bpm):  100 % MPHR obtained:  100  Reached 85% MPHR (min:sec):  6:30 Total Exercise Time (min-sec):  7:30  Workload in METS:  9.3 Borg Scale: 15  Reason ETT Terminated:  desired heart rate attained    ST Segment Analysis At Rest: normal ST segments - no evidence of significant ST depression With Exercise: no evidence of significant ST depression  Other Information Arrhythmia:  No Angina during ETT:  absent (0) Quality of ETT:  diagnostic  ETT Interpretation:  normal - no evidence of ischemia by ST analysis  Comments: The patient had an moderate exercise tolerance.  There was no chest pain.  There was an appropriate level of dyspnea.  There were no arrhythmias, a normal heart rate response and normal BP response.  There were no ischemic ST T wave changes and a normal heart rate recovery.  Recommendations: Negative adequate ETT.  No further testing is indicated.  Based on the above I gave the patient a prescription for exercise.

## 2011-07-25 NOTE — Procedures (Signed)
Done

## 2011-07-26 ENCOUNTER — Other Ambulatory Visit: Payer: Self-pay | Admitting: Physician Assistant

## 2011-08-24 ENCOUNTER — Other Ambulatory Visit: Payer: Self-pay | Admitting: Internal Medicine

## 2011-09-18 ENCOUNTER — Encounter: Payer: Self-pay | Admitting: Emergency Medicine

## 2011-10-29 ENCOUNTER — Other Ambulatory Visit: Payer: Self-pay | Admitting: Radiology

## 2011-10-29 ENCOUNTER — Other Ambulatory Visit: Payer: Self-pay

## 2011-10-29 MED ORDER — PRAVASTATIN SODIUM 40 MG PO TABS: 40.0000 mg | ORAL_TABLET | Freq: Every day | ORAL | Status: DC

## 2011-10-29 NOTE — Telephone Encounter (Signed)
Patient requests 3 mo supply with refill, please advise,send to [facility].

## 2011-12-08 ENCOUNTER — Telehealth: Payer: Self-pay | Admitting: Physician Assistant

## 2011-12-08 NOTE — Telephone Encounter (Signed)
Pt would like a note stating that she should be going to [facility] for weight loss and cholesterol.  Did letter and gave to patient.

## 2012-02-08 ENCOUNTER — Other Ambulatory Visit: Payer: Self-pay | Admitting: Physician Assistant

## 2012-03-16 ENCOUNTER — Encounter: Payer: Self-pay | Admitting: Emergency Medicine

## 2012-03-16 ENCOUNTER — Ambulatory Visit (INDEPENDENT_AMBULATORY_CARE_PROVIDER_SITE_OTHER): Payer: BC Managed Care – PPO | Admitting: Emergency Medicine

## 2012-03-16 VITALS — BP 149/77 | HR 66 | Temp 97.3°F | Resp 16 | Ht 64.0 in | Wt 191.0 lb

## 2012-03-16 DIAGNOSIS — E785 Hyperlipidemia, unspecified: Secondary | ICD-10-CM

## 2012-03-16 DIAGNOSIS — R635 Abnormal weight gain: Secondary | ICD-10-CM

## 2012-03-16 DIAGNOSIS — R5381 Other malaise: Secondary | ICD-10-CM

## 2012-03-16 MED ORDER — ATORVASTATIN CALCIUM 20 MG PO TABS: 20.0000 mg | ORAL_TABLET | Freq: Every day | ORAL | Status: DC

## 2012-03-16 NOTE — Progress Notes (Signed)
  Subjective:    Patient ID: Jennifer [name], female    DOB: March 24, 1950, 62 y.o.   MRN: [id number]  HPI patient enters because she's been significantly fatigued at the end of the day. She is extremely tired and just wants to sit on the couch and not do anything. She did exercise for a while but has since stopped and now her weight is going back up. She also has had an intermittent twitch in her left hand none for the last 2 weeks    Review of Systems     Objective:   Physical Exam her neck is supple. Chest is clear to auscultation and percussion. Cardiac exam reveals regular rate without murmurs rubs or gallops. Extremities without edema        Assessment & Plan:  Routine labs to screen for fatigue were done today her pressure is elevated today at 160/100. She was going to take her blood pressure twice a week for the next 2 weeks and then let me know what that is. Has stopped the pravastatin and switch her to Lipitor 20 one a day. You may need to consider changing her antidepressant to something like Wellbutrin which would have less sedating effects.

## 2012-03-17 LAB — CBC WITH DIFFERENTIAL/PLATELET
HCT: 37.7 % (ref 36.0–46.0)
Hemoglobin: 12.9 g/dL (ref 12.0–15.0)
Lymphs Abs: 1 10*3/uL (ref 0.7–4.0)
Monocytes Relative: 10 % (ref 3–12)
Neutro Abs: 2.6 10*3/uL (ref 1.7–7.7)
Neutrophils Relative %: 62 % (ref 43–77)
RBC: 4.43 MIL/uL (ref 3.87–5.11)

## 2012-03-17 LAB — VITAMIN D 25 HYDROXY (VIT D DEFICIENCY, FRACTURES): Vit D, 25-Hydroxy: 35 ng/mL (ref 30–89)

## 2012-03-17 LAB — COMPREHENSIVE METABOLIC PANEL
Albumin: 4.4 g/dL (ref 3.5–5.2)
CO2: 26 mEq/L (ref 19–32)
Calcium: 9.2 mg/dL (ref 8.4–10.5)
Chloride: 105 mEq/L (ref 96–112)
Glucose, Bld: 89 mg/dL (ref 70–99)
Potassium: 4.3 mEq/L (ref 3.5–5.3)
Sodium: 140 mEq/L (ref 135–145)
Total Protein: 6.7 g/dL (ref 6.0–8.3)

## 2012-03-17 LAB — LIPID PANEL
Cholesterol: 198 mg/dL (ref 0–200)
LDL Cholesterol: 113 mg/dL — ABNORMAL HIGH (ref 0–99)
Triglycerides: 176 mg/dL — ABNORMAL HIGH (ref <150)

## 2012-03-28 ENCOUNTER — Ambulatory Visit (INDEPENDENT_AMBULATORY_CARE_PROVIDER_SITE_OTHER): Payer: BC Managed Care – PPO

## 2012-03-28 DIAGNOSIS — G4733 Obstructive sleep apnea (adult) (pediatric): Secondary | ICD-10-CM

## 2012-03-28 DIAGNOSIS — G4710 Hypersomnia, unspecified: Secondary | ICD-10-CM

## 2012-03-28 DIAGNOSIS — R0683 Snoring: Secondary | ICD-10-CM

## 2012-04-01 ENCOUNTER — Telehealth: Payer: Self-pay | Admitting: *Deleted

## 2012-04-01 NOTE — Telephone Encounter (Signed)
Called regarding referral from Dr. Cleta Alberts, asked pt to call us back in order to schedule a sleep consult appt. -sh

## 2012-04-10 ENCOUNTER — Telehealth: Payer: Self-pay | Admitting: Neurology

## 2012-04-10 NOTE — Progress Notes (Signed)
Please call pt to notify her that her sleep study has been read and that there is evidence of obstructive sleep apnea. pls schedule a new appt with me so we can go over test results and treatment plan. thx Pls copy report to Dr. Cleta Alberts. thx  s

## 2012-04-24 ENCOUNTER — Ambulatory Visit (INDEPENDENT_AMBULATORY_CARE_PROVIDER_SITE_OTHER): Payer: BC Managed Care – PPO | Admitting: Neurology

## 2012-04-24 ENCOUNTER — Encounter: Payer: Self-pay | Admitting: Neurology

## 2012-04-24 VITALS — BP 133/73 | HR 73 | Temp 98.2°F | Wt 192.0 lb

## 2012-04-24 DIAGNOSIS — G4733 Obstructive sleep apnea (adult) (pediatric): Secondary | ICD-10-CM | POA: Insufficient documentation

## 2012-04-24 DIAGNOSIS — G4761 Periodic limb movement disorder: Secondary | ICD-10-CM

## 2012-04-24 HISTORY — DX: Periodic limb movement disorder: G47.61

## 2012-04-24 NOTE — Progress Notes (Signed)
Subjective:    Patient ID: Jennifer [name] is a 62 y.o. female.  HPI Interim history:  Jennifer [name] is a very pleasant 62 year old right-handed woman with an underlying medical history of hypertension, hyperlipidemia, arthritis and depression who presents for followup consultation of her obstructive sleep apnea. She is unaccompanied today and I first met her on 03/23/2012 at the request of Dr. Cleta Alberts. She has in the interim undergone a split-night study on 03/28/2012 and I read her sleep study. I went over her test results with her in detail today:  The sleep efficiency in the baseline portion of the study was reduced at 81.3% with a latency to sleep of 12 minutes and wake after sleep onset of 20.5 minutes with severe sleep fragmentation noted. She had an elevated arousal index of 31 per hour, primarily do to apneas and hypopneas. She had a significantly increased stage I sleep, reduced percentage of stage II sleep, increased percentage of slow-wave sleep at 26.9% and absence of REM sleep prior to CPAP initiation. She had severe periodic leg movements at 53.4 per hour with an associated arousal index low at 4.2 per hour. She had intermittent mild snoring. She had a total of 1 obstructive apneas and 75 obstructive hypopneas rendering an elevated AHI of 32.2 events per hour. Her baseline oxygen saturation was noted to be only 90% and her nadir was 84%. She was then started on CPAP and titrated from 5-9 cm water pressure and had difficulty tolerating CPAP and was also noted to have CPAP emergent central apneas above the pressure of 7 cm. At that pressure her AHI was only reduced to 9.1 events per hour. She again was noted to have periodic leg movements with  an index of 20 per hour and an associated with arousal index of 1.9 per hour. There were no EKG changes. She Reports no changes in her sleep related complaints which are snoring, nonrestorative sleep and daytime somnolence. She also reports no  changes in her medical history or medications. She denies any recent fevers, chills, cough or cold symptoms.         Her Past Medical History Is Significant For: Past Medical History  Diagnosis Date  . Hyperlipidemia   . Osteoarthritis   . OSA (obstructive sleep apnea) 04/24/2012  . PLMD (periodic limb movement disorder) 04/24/2012   Her Past Surgical History Is Significant For: Past Surgical History  Procedure Laterality Date  . Joint replacement  1998    right knee  . Cholecystectomy  1978  . Cesarean section     Her Family History Is Significant For: Family History  Problem Relation Age of Onset  . Cancer Father 9  . Heart disease Mother 50    Aortic valve disease  . Atrial fibrillation Brother   . Sleep apnea Brother   . Sleep apnea Sister   . Sleep apnea Sister    Her Social History Is Significant For: History   Social History  . Marital Status: Divorced    Spouse Name: N/A    Number of Children: 1  . Years of Education: N/A   Occupational History  . [employer]    Social History Main Topics  . Smoking status: Never Smoker   . Smokeless tobacco: None  . Alcohol Use: No  . Drug Use: No  . Sexually Active: No   Other Topics Concern  . None   Social History Narrative   Lives with cats.     Her Allergies Are:  Allergies  Allergen Reactions  . Crestor (Rosuvastatin)   . Neomycin-Bacitracin Zn-Polymyx Swelling    Ophthalmic only(eye swelling)  . Codeine Rash  . Penicillins Rash  . Piroxicam Rash  . Sulfonamide Derivatives Rash  :  Her Current Medications Are:  Outpatient Encounter Prescriptions as of 04/24/2012  Medication Sig Dispense Refill  . atorvastatin (LIPITOR) 20 MG tablet Take 1 tablet (20 mg total) by mouth daily.  90 tablet  3  . b complex vitamins tablet Take 1 tablet by mouth daily.        . cetirizine (ZYRTEC) 10 MG tablet Take 10 mg by mouth at bedtime.        . citalopram (CELEXA) 40 MG tablet TAKE ONE TABLET BY MOUTH EVERY  DAY  90 tablet  3  . co-enzyme Q-10 30 MG capsule Take 30 mg by mouth at bedtime.       . Multiple Vitamins-Minerals (MULTIVITAMINS THER. W/MINERALS) TABS Take 1 tablet by mouth daily.        . cholecalciferol (VITAMIN D) 1000 UNITS tablet Take 2,000 Units by mouth daily after breakfast.        . pravastatin (PRAVACHOL) 40 MG tablet        Facility-Administered Encounter Medications as of 04/24/2012  Medication Dose Route Frequency Provider Last Rate Last Dose  . TDaP (BOOSTRIX) injection 0.5 mL  0.5 mL Intramuscular Once Collene Gobble, MD      : Review of Systems  Constitutional: Positive for fatigue.  Psychiatric/Behavioral:       Sleepiness   Objective:  Neurologic Exam  Physical Exam Physical Examination:   Filed Vitals:   04/24/12 0839  BP: 133/73  Pulse: 73  Temp: 98.2 F (36.8 C)   General Examination: The patient is a very pleasant 62 y.o. female in no acute distress.   HEENT exam: Normocephalic, atraumatic, pupils are equal, round and reactive to light and accommodation. Funduscopic exam is normal with sharp disc margins noted. Extraocular tracking is good without nystagmus noted. Normal smooth pursuit is noted. Hearing is grossly intact. Tympanic membranes are clear bilaterally. Face is symmetric with normal facial animation and normal facial sensation. Speech is clear with no dysarthria noted. There is no lip, neck or jaw tremor. Neck is supple with full range of motion. Oropharynx exam reveals  a mildly narrow airway. Uvula is normal in size. Tongue is normal in size. Mallampati is class II. Tongue protrudes centrally and palate elevates symmetrically. Tonsils are 1+. Chest is clear to auscultation without wheezing, rhonchi or crackles noted. Heart sounds are normal without murmurs, rubs or gallops noted. Abdomen is soft, nontender with normal bowel sounds appreciated. There is no pitting edema in the distal lower extremities bilaterally. Skin is warm and dry with no  trophic changes noted. Musculoskeletal exam reveals no obvious joint deformities or joint swelling. Neurologically: Mental status: The patient is awake, alert and oriented in all 4 spheres. Her Memory, attention, language and knowledge are appropriate. There is no aphasia, agnosia, apraxia or anomia. Cranial nerves are as described above under HEENT exam. In addition, shoulder shrug is normal. Motor exam: Normal bulk, strength and tone is noted. There is no drift, tremor or rebound. Romberg is negative. Reflexes are 2+ throughout. Toes are downgoing bilaterally. Fine motor skills are intact with normal finger taps, normal hand movements, normal rapid alternating patting, normal foot taps and normal foot agility. Cerebellar testing shows no dysmetria or intention tremor on finger to nose testing. There is no truncal or gait ataxia.  Sensory exam is intact to light touch, pinprick, vibration, temperature sense in the upper and lower extremities. Gait, station and balance are unremarkable. Posture is age-appropriate and stance is narrow based. No problems turning are noted. Tandem walk is unremarkable.   she is able to pull herself up on her toes and heels.     Assessment and Plan:   In summary, Jennifer [name] is a very pleasant 62 y.o.-year old female with a history of hypertension with recent confirmation of OSA. I talked to her at length about her sleep study report and explained the findings to her and I suggested a trial of auto Pap at home with his pressure range of 6-10 cm using the same mask she used during the sleep study. Overall I do believe she had done better with treatment, for example her baseline oxygen saturation was only 90% during the baseline study but improved to 92% with treatment. She has no underlying lung disease or congestive heart failure history of asthma or smoking and I am not so sure why her baseline oxygen saturation was only 90% at the time. At this moment I suggested a  followup in about 6 or 7 weeks after she has had a chance to use AutoPAP at home and I will request a CPAP compliance download after 30 day and based on that report I may be able to adjust her CPAP pressure level to her final pressure. She was in agreement with the plan and I asked her to call me with any interim questions or concerns.

## 2012-04-24 NOTE — Patient Instructions (Signed)
I would like for you to get treated with CPAP for your obstructive sleep apnea. Since he did not do that well during your recent sleep study on CPAP I would like to try you on a machine called AutoPap, which delivers a range of pressure and can give [place] a sense of blood pressure may be better for you and let's to get used to CPAP a little easier. I do recommend that you use it nightly after I prescribed it and after it has been set up for you. After you have used AutoPap for about 6 weeks, I would like for you to come back and we will look at your downloaded report from your machine and based on that I could potentially prescribe the final pressure for your treatment. Please call in the interim if you have any questions or concerns.

## 2012-06-02 ENCOUNTER — Telehealth: Payer: Self-pay | Admitting: Neurology

## 2012-06-16 ENCOUNTER — Telehealth: Payer: Self-pay | Admitting: *Deleted

## 2012-06-16 NOTE — Telephone Encounter (Signed)
Pt has a CPE coming up on 06/23/12 and would like for you to put in some future orders for bloodwork.  Thanks.

## 2012-06-17 ENCOUNTER — Other Ambulatory Visit: Payer: Self-pay

## 2012-06-17 DIAGNOSIS — Z9989 Dependence on other enabling machines and devices: Secondary | ICD-10-CM

## 2012-06-17 DIAGNOSIS — G4733 Obstructive sleep apnea (adult) (pediatric): Secondary | ICD-10-CM

## 2012-06-17 DIAGNOSIS — M1990 Unspecified osteoarthritis, unspecified site: Secondary | ICD-10-CM

## 2012-06-17 DIAGNOSIS — IMO0001 Reserved for inherently not codable concepts without codable children: Secondary | ICD-10-CM

## 2012-06-17 DIAGNOSIS — R739 Hyperglycemia, unspecified: Secondary | ICD-10-CM

## 2012-06-17 NOTE — Telephone Encounter (Signed)
LMOM that orders are in system.

## 2012-06-17 NOTE — Telephone Encounter (Signed)
Call and let her know or orders have been put in the system.

## 2012-06-21 ENCOUNTER — Other Ambulatory Visit (INDEPENDENT_AMBULATORY_CARE_PROVIDER_SITE_OTHER): Payer: BC Managed Care – PPO

## 2012-06-21 DIAGNOSIS — R739 Hyperglycemia, unspecified: Secondary | ICD-10-CM

## 2012-06-21 DIAGNOSIS — IMO0001 Reserved for inherently not codable concepts without codable children: Secondary | ICD-10-CM

## 2012-06-21 DIAGNOSIS — I10 Essential (primary) hypertension: Secondary | ICD-10-CM

## 2012-06-21 DIAGNOSIS — E785 Hyperlipidemia, unspecified: Secondary | ICD-10-CM

## 2012-06-21 DIAGNOSIS — G4733 Obstructive sleep apnea (adult) (pediatric): Secondary | ICD-10-CM

## 2012-06-21 DIAGNOSIS — Z9989 Dependence on other enabling machines and devices: Secondary | ICD-10-CM

## 2012-06-21 DIAGNOSIS — M1990 Unspecified osteoarthritis, unspecified site: Secondary | ICD-10-CM

## 2012-06-21 LAB — POCT CBC
HCT, POC: 41.7 % (ref 37.7–47.9)
Lymph, poc: 1.1 (ref 0.6–3.4)
MCHC: 31.7 g/dL — AB (ref 31.8–35.4)
MCV: 93.4 fL (ref 80–97)
MID (cbc): 0.4 (ref 0–0.9)
POC LYMPH PERCENT: 20 %L (ref 10–50)
RDW, POC: 13.2 %

## 2012-06-21 LAB — COMPREHENSIVE METABOLIC PANEL
Alkaline Phosphatase: 106 U/L (ref 39–117)
BUN: 11 mg/dL (ref 6–23)
Creat: 0.68 mg/dL (ref 0.50–1.10)
Glucose, Bld: 89 mg/dL (ref 70–99)
Total Bilirubin: 0.6 mg/dL (ref 0.3–1.2)

## 2012-06-21 NOTE — Addendum Note (Signed)
Addended by: Johnnette Litter on: 06/21/2012 08:42 AM   Modules accepted: Orders

## 2012-06-23 ENCOUNTER — Encounter: Payer: Self-pay | Admitting: Emergency Medicine

## 2012-06-23 ENCOUNTER — Encounter: Payer: Self-pay | Admitting: Physician Assistant

## 2012-06-23 ENCOUNTER — Ambulatory Visit (INDEPENDENT_AMBULATORY_CARE_PROVIDER_SITE_OTHER): Payer: BC Managed Care – PPO | Admitting: Emergency Medicine

## 2012-06-23 VITALS — BP 136/74 | HR 81 | Temp 98.6°F | Resp 18 | Ht 64.25 in | Wt 200.0 lb

## 2012-06-23 DIAGNOSIS — R21 Rash and other nonspecific skin eruption: Secondary | ICD-10-CM

## 2012-06-23 DIAGNOSIS — Z0000 Encounter for general adult medical examination without abnormal findings: Secondary | ICD-10-CM

## 2012-06-23 LAB — LIPID PANEL
Cholesterol: 215 mg/dL — ABNORMAL HIGH (ref 0–200)
HDL: 54 mg/dL (ref >39)
Total CHOL/HDL Ratio: 4 Ratio
Triglycerides: 210 mg/dL — ABNORMAL HIGH (ref <150)
VLDL: 42 mg/dL — ABNORMAL HIGH (ref 0–40)

## 2012-06-23 MED ORDER — CLOTRIMAZOLE-BETAMETHASONE 1-0.05 % EX CREA: TOPICAL_CREAM | Freq: Two times a day (BID) | CUTANEOUS | Status: DC

## 2012-06-23 NOTE — Progress Notes (Deleted)
  Subjective:    Patient ID: Jennifer [name], female    DOB: Dec 18, 1950, 62 y.o.   MRN: [id number]  HPI    Review of Systems  Musculoskeletal: Positive for joint swelling.  Psychiatric/Behavioral: Positive for sleep disturbance.       Objective:   Physical Exam        Assessment & Plan:

## 2012-06-23 NOTE — Progress Notes (Signed)
@[email or web address] @  Patient ID: Jennifer [name] MRN: [id number], DOB: Apr 29, 1950, 62 y.o. Date of Encounter: 06/23/2012, 12:10 PM  Primary Physician: Lucilla Edin, MD  Chief Complaint: Physical (CPE)  HPI: 62 y.o. y/o female with history of noted below here for CPE.  Doing well. No issues/complaints.  LMP:  Pap: MMG: Review of Systems:  Consitutional: No fever, chills, fatigue, night sweats, lymphadenopathy, or weight changes. Eyes: No visual changes, eye redness, or discharge. ENT/Mouth: Ears: No otalgia, tinnitus, hearing loss, discharge. Nose: No congestion, rhinorrhea, sinus pain, or epistaxis. Throat: No sore throat, post nasal drip, or teeth pain. Cardiovascular: No CP, palpitations, diaphoresis, DOE, edema, orthopnea, PND. Respiratory: No shortness of breath she recently started CPAP for obstructive sleep apnea. She still has some daytime to see but feels better Gastrointestinal: No anorexia, dysphagia, reflux, pain, nausea, vomiting, hematemesis, diarrhea, constipation, BRBPR, or melena. Breast: No discharge, pain, swelling, or mass. Genitourinary: No dysuria, frequency, urgency, hematuria, incontinence, nocturia, amenorrhea, vaginal discharge, pruritis, burning, abnormal bleeding, or pain. Musculoskeletal: No decreased ROM, myalgias, stiffness, joint swelling, or weakness she has some pain in her surgically replaced knee appear. Skin: No rash, erythema, lesion changes, pain, warmth, jaundice, or pruritis. Neurological: No headache, dizziness, syncope, seizures, tremors, memory loss, coordination problems, or paresthesias. Psychological: No anxiety, depression, hallucinations, SI/HI. Endocrine: No fatigue, polydipsia, polyphagia, polyuria, or known diabetes. All other systems were reviewed and are otherwise negative.  Past Medical History  Diagnosis Date  . Hyperlipidemia   . Osteoarthritis   . OSA (obstructive sleep apnea) 04/24/2012  . PLMD (periodic limb movement disorder)  04/24/2012     Past Surgical History  Procedure Laterality Date  . Joint replacement  1998    right knee  . Cholecystectomy  1978  . Cesarean section      Home Meds:  Prior to Admission medications   Medication Sig Start Date End Date Taking? Authorizing Provider  atorvastatin (LIPITOR) 20 MG tablet Take 1 tablet (20 mg total) by mouth daily. 03/16/12  Yes Collene Gobble, MD  citalopram (CELEXA) 40 MG tablet TAKE ONE TABLET BY MOUTH EVERY DAY 08/24/11  Yes Anders Simmonds, PA-C  Multiple Vitamins-Minerals (MULTIVITAMINS THER. W/MINERALS) TABS Take 1 tablet by mouth daily.     Yes Historical Provider, MD  b complex vitamins tablet Take 1 tablet by mouth daily.      Historical Provider, MD  cetirizine (ZYRTEC) 10 MG tablet Take 10 mg by mouth at bedtime.      Historical Provider, MD  cholecalciferol (VITAMIN D) 1000 UNITS tablet Take 2,000 Units by mouth daily after breakfast.      Historical Provider, MD  co-enzyme Q-10 30 MG capsule Take 30 mg by mouth at bedtime.     Historical Provider, MD  pravastatin (PRAVACHOL) 40 MG tablet  02/08/12   Historical Provider, MD    Allergies:  Allergies  Allergen Reactions  . Crestor (Rosuvastatin)   . Neomycin-Bacitracin Zn-Polymyx Swelling    Ophthalmic only(eye swelling)  . Codeine Rash  . Penicillins Rash  . Piroxicam Rash  . Sulfonamide Derivatives Rash    History   Social History  . Marital Status: Divorced    Spouse Name: N/A    Number of Children: 1  . Years of Education: N/A   Occupational History  . [employer]    Social History Main Topics  . Smoking status: Never Smoker   . Smokeless tobacco: Not on file  . Alcohol Use: No  . Drug Use: No  .  Sexually Active: No   Other Topics Concern  . Not on file   Social History Narrative   Lives with cats.      Family History  Problem Relation Age of Onset  . Cancer Father 61  . Heart disease Mother 75    Aortic valve disease  . Atrial fibrillation Brother   .  Sleep apnea Brother   . Sleep apnea Sister   . Sleep apnea Sister     Physical Exam  Blood pressure 136/74, pulse 81, temperature 98.6 F (37 C), temperature source Oral, resp. rate 18, height 5' 4.25" (1.632 m), weight 200 lb (90.719 kg), SpO2 96.00%., Body mass index is 34.06 kg/(m^2). General: Well developed, well nourished, in no acute distress. HEENT: Normocephalic, atraumatic. Conjunctiva pink, sclera non-icteric. Pupils 2 mm constricting to 1 mm, round, regular, and equally reactive to light and accomodation. EOMI. Internal auditory canal clear. TMs with good cone of light and without pathology. Nasal mucosa pink. Nares are without discharge. No sinus tenderness. Oral mucosa pink. Dentition. Pharynx without exudate.   Neck: Supple. Trachea midline. No thyromegaly. Full ROM. No lymphadenopathy. Lungs: Clear to auscultation bilaterally without wheezes, rales, or rhonchi. Breathing is of normal effort and unlabored. Cardiovascular: RRR with S1 S2. No murmurs, rubs, or gallops appreciated. Distal pulses 2+ symmetrically. No carotid or abdominal bruits.    Breast: No tenderness and no masses felt.   Abdomen: Soft, non-tender, non-distended with normoactive bowel sounds. No hepatosplenomegaly or masses. No rebound/guarding. No CVA tenderness. Without hernias.  Genitourinary: There is some breakdown of the skin between her labia and the creases of her leg. Vagina normal cervix normal there's no uterine enlargement no adnexal masses are felt. Rectal exam confirmed above and hemosure  was     Musculoskeletal: Full range of motion and 5/5 strength throughout. Without swelling, atrophy, tenderness, crepitus, or warmth. Extremities without clubbing, cyanosis, or edema. Calves supple. Skin: Warm and moist without erythema, ecchymosis, wounds, or rash. Neuro: A+Ox3. CN II-XII grossly intact. Moves all extremities spontaneously. Full sensation throughout. Normal gait. DTR 2+ throughout upper and  lower extremities. Finger to nose intact. Psych:  Responds to questions appropriately with a normal affect.   Results for orders placed in visit on 06/23/12  IFOBT (OCCULT BLOOD)      Result Value Range   IFOBT Negative          Assessment/Plan:  62 y.o. y/o female here for CPE will use some Lotrisone cream for the skin breakdown in her labial area. Other meds stay the same.  -  Signed, Earl Lites, MD 06/23/2012 12:10 PM

## 2012-06-24 ENCOUNTER — Ambulatory Visit: Payer: BC Managed Care – PPO | Admitting: Neurology

## 2012-06-24 LAB — PAP IG (IMAGE GUIDED)

## 2012-07-02 ENCOUNTER — Encounter: Payer: Self-pay | Admitting: Emergency Medicine

## 2012-07-28 ENCOUNTER — Encounter: Payer: Self-pay | Admitting: Neurology

## 2012-07-28 ENCOUNTER — Ambulatory Visit (INDEPENDENT_AMBULATORY_CARE_PROVIDER_SITE_OTHER): Payer: BC Managed Care – PPO | Admitting: Neurology

## 2012-07-28 VITALS — BP 134/76 | HR 67 | Ht 65.0 in | Wt 198.0 lb

## 2012-07-28 DIAGNOSIS — G4733 Obstructive sleep apnea (adult) (pediatric): Secondary | ICD-10-CM

## 2012-07-28 NOTE — Progress Notes (Signed)
Subjective:    Patient ID: Jennifer [name] is a 62 y.o. female.  HPI  Interim history:   Jennifer [name] is a very pleasant 63 year old right-handed woman who presents for followup consultation of her obstructive sleep apnea. I first met her on 03/23/2012 at the request of Dr. Cleta Alberts and she had a split-night sleep study on 03/28/2012 which I explained to her during our followup visit on 04/24/2012. The sleep study showed an AHI of 32.2 per hour with a baseline oxygen saturation of 90% and a nadir of 84%. She was started on CPAP and titrated from 5-9 cm of water pressure and above 7 cm she started having central apneas. At that pressure her AHI was reduced to 9.1 per hour. She had significant period leg movements of sleep but a low arousal index. At the time of our visit in April I suggested a trial of AutoPap at home with pressure ranging from 6-10 cm of water pressure. She is using CPAP and reports better sleep, feeling more rested and has adjusted well to it. She feels better with CPAP. She started using CPAP on 6/614 and turned in the compliance chip on 07/14/12 before her vacation, but did take the machine w her to [address]. She is using nasal pillows, tolerating them well. She has no new complaints. She uses it nightly and estimates she sleeps 6-8 hours with it on.   We've requested compliance data from her DME office and I reviewed her 30 day compliance data with her. This was from 06/12/2012 through 07/13/2012, which is a total of 32 days during which time she uses CPAP every day. Her present use it above 4 hours was 94%, indicating excellent compliance. Her average usage was 6 hours and 9 minutes. Her leak was rather low. Her residual AHI was 2.7 per hour. Her median pressure was 7.7 on her 95th percentile was 9.8. EPR was at 2.  Her Past Medical History Is Significant For: Past Medical History  Diagnosis Date  . Hyperlipidemia   . Osteoarthritis   . OSA (obstructive sleep apnea)  04/24/2012  . PLMD (periodic limb movement disorder) 04/24/2012    Her Past Surgical History Is Significant For: Past Surgical History  Procedure Laterality Date  . Joint replacement  1998    right knee  . Cholecystectomy  1978  . Cesarean section      Her Family History Is Significant For: Family History  Problem Relation Age of Onset  . Cancer Father 63  . Heart disease Mother 30    Aortic valve disease  . Atrial fibrillation Brother   . Sleep apnea Brother   . Sleep apnea Sister   . Sleep apnea Sister     Her Social History Is Significant For: History   Social History  . Marital Status: Divorced    Spouse Name: N/A    Number of Children: 1  . Years of Education: N/A   Occupational History  . [employer]    Social History Main Topics  . Smoking status: Never Smoker   . Smokeless tobacco: None  . Alcohol Use: No  . Drug Use: No  . Sexually Active: No   Other Topics Concern  . None   Social History Narrative   Lives with cats.      Her Allergies Are:  Allergies  Allergen Reactions  . Crestor (Rosuvastatin)   . Neomycin-Bacitracin Zn-Polymyx Swelling    Ophthalmic only(eye swelling)  . Codeine Rash  . Penicillins Rash  .  Piroxicam Rash  . Sulfonamide Derivatives Rash  :   Her Current Medications Are:  Outpatient Encounter Prescriptions as of 07/28/2012  Medication Sig Dispense Refill  . atorvastatin (LIPITOR) 20 MG tablet Take 1 tablet (20 mg total) by mouth daily.  90 tablet  3  . b complex vitamins tablet Take 1 tablet by mouth daily.        . cholecalciferol (VITAMIN D) 1000 UNITS tablet Take 2,000 Units by mouth daily after breakfast.        . citalopram (CELEXA) 40 MG tablet TAKE ONE TABLET BY MOUTH EVERY DAY  90 tablet  3  . clotrimazole-betamethasone (LOTRISONE) cream Apply topically 2 (two) times daily.  30 g  0  . co-enzyme Q-10 30 MG capsule Take 30 mg by mouth at bedtime.       . Multiple Vitamins-Minerals (MULTIVITAMINS THER.  W/MINERALS) TABS Take 1 tablet by mouth daily.        . [DISCONTINUED] cetirizine (ZYRTEC) 10 MG tablet Take 10 mg by mouth at bedtime.        . [DISCONTINUED] pravastatin (PRAVACHOL) 40 MG tablet        Facility-Administered Encounter Medications as of 07/28/2012  Medication Dose Route Frequency Provider Last Rate Last Dose  . TDaP (BOOSTRIX) injection 0.5 mL  0.5 mL Intramuscular Once Jennifer Gobble, MD      : Review of Systems  Musculoskeletal: Positive for myalgias.  Psychiatric/Behavioral:       Sleepiness     Objective:  Neurologic Exam  Physical Exam Physical Examination:   Filed Vitals:   07/28/12 1547  BP: 134/76  Pulse: 67    General Examination: The patient is a very pleasant 62 y.o. female in no acute distress. She appears well-developed and well-nourished and well groomed.   HEENT exam: Normocephalic, atraumatic, pupils are equal, round and reactive to light and accommodation. Funduscopic exam is normal with sharp disc margins noted. Extraocular tracking is good without nystagmus noted. Normal smooth pursuit is noted. Hearing is grossly intact. Tympanic membranes are clear bilaterally. Face is symmetric with normal facial animation and normal facial sensation. Speech is clear with no dysarthria noted. There is no lip, neck or jaw tremor. Neck is supple with full range of motion. Oropharynx exam reveals  a mildly narrow airway. Uvula is normal in size. Tongue is normal in size. Mallampati is class II. Tongue protrudes centrally and palate elevates symmetrically. Tonsils are 1+. Chest is clear to auscultation without wheezing, rhonchi or crackles noted. Heart sounds are normal without murmurs, rubs or gallops noted. Abdomen is soft, nontender with normal bowel sounds appreciated. There is no pitting edema in the distal lower extremities bilaterally. Skin is warm and dry with no trophic changes noted. Musculoskeletal exam reveals no obvious joint deformities or joint  swelling. Neurologically: Mental status: The patient is awake, alert and oriented in all 4 spheres. Her Memory, attention, language and knowledge are appropriate. There is no aphasia, agnosia, apraxia or anomia. Cranial nerves are as described above under HEENT exam. In addition, shoulder shrug is normal. Motor exam: Normal bulk, strength and tone is noted. There is no drift, tremor or rebound. Romberg is negative. Reflexes are 2+ throughout. Toes are downgoing bilaterally. Fine motor skills are intact with normal finger taps, normal hand movements, normal rapid alternating patting, normal foot taps and normal foot agility. Cerebellar testing shows no dysmetria or intention tremor on finger to nose testing. There is no truncal or gait ataxia. Sensory exam is intact  to light touch, pinprick, vibration, temperature sense in the upper and lower extremities. Gait, station and balance are unremarkable. Posture is age-appropriate and stance is narrow based. No problems turning are noted. Tandem walk is unremarkable.   she is able to pull herself up on her toes and heels.        Assessment and Plan:   In summary, Jennifer [name] is a very pleasant 62 y.o.-year old female with a history of OSA, on CPAP treatment at a pressure of 6-10 cmp. Her physical exam is stable and She indicates good results with the use of CPAP, and good tolerance of the pressure and mask. I reviewed the compliance data with the patient and encouraged her to continue to use CPAP regularly to help reduce cardiovascular risk. I congratulated her on her good compliance and based on the compliance report I would like to set her pressure at 10 cm.  We also talked about trying to maintaining a healthy lifestyle in general. I encouraged the patient to eat healthy, exercise daily and keep well hydrated, to keep a scheduled bedtime and wake time routine, to not skip any meals and eat healthy snacks in between meals and to have protein with every  meal. I stressed the importance of regular exercise and she indicates that she has joined [employer] and will be scaling back on her work schedule. I answered all her questions today and the patient was advised to come back to see me in 6 months since she is stable. I will be sending and updated CPAP order to her DME company. She was in agreement with the plan.

## 2012-07-28 NOTE — Patient Instructions (Addendum)
Please continue using your CPAP regularly. While your insurance requires that you use CPAP at least 4 hours each night on 70% of the nights, I recommend, that you not skip any nights and use it throughout the night if you can. Getting used to CPAP does take time and patience and discipline. Untreated obstructive sleep apnea when it is moderate to severe can have an adverse impact on cardiovascular health and raise her risk for heart disease, arrhythmias, hypertension, congestive heart failure, stroke and diabetes. Untreated obstructive sleep apnea causes sleep disruption, nonrestorative sleep, and sleep deprivation. This can have an impact on your day to day functioning and cause daytime sleepiness and impairment of cognitive function, memory loss, mood disturbance, and problems focussing. Using CPAP regularly can improve these symptoms.  After I review your compliance data, I will have your DME company set your CPAP pressure for long term use. I would like to see your back in 6 months, sooner if we need to.

## 2012-08-08 ENCOUNTER — Encounter: Payer: Self-pay | Admitting: Neurology

## 2012-08-17 NOTE — Progress Notes (Signed)
Quick Note:  I reviewed the patient's auto-PAP compliance data from 06/12/2012 to 07/13/2012, which is a total of 32 days, during which time the patient used CPAP every day. The average usage for all days was 6 hours and 9 minutes. The percent used days greater than 4 hours was 94 %, indicating very good compliance. The residual AHI was 2.7 per hour. 95th percentile of the pressure which was auto set at 6-10 cm was 9.8, which indicates, that a pressure of 10 cm would be adequate. I reviewed this data with the patient at the last visit, provided feedback and set her pressure to 10 cm.   Huston Foley, MD, PhD [facility] ([facility]   ______

## 2012-08-19 ENCOUNTER — Other Ambulatory Visit: Payer: Self-pay | Admitting: Physician Assistant

## 2012-08-19 ENCOUNTER — Encounter: Payer: Self-pay | Admitting: Neurology

## 2012-08-19 NOTE — Progress Notes (Signed)
Quick Note:  I reviewed the patient's CPAP compliance data from 08/06/12 to 08/13/12, which is a total of 8 days, during which time the patient used CPAP every day except for 1 day. The average usage for all days was 6 hours and 3 minutes. The percent used days greater than 4 hours was 88%, indicating good compliance. The residual AHI was 2.3 per hour, indicating an appropriate treatment pressure of 10 cwp. I will review this data with the patient at the next visit, provide feedback and additional trouble shooting if need be. I would also await a longer download period, such as 30 days or longer to get a better feel for how treatment is going.  Huston Foley, MD, PhD [facility] ([facility]   ______

## 2012-08-26 ENCOUNTER — Encounter: Payer: Self-pay | Admitting: Neurology

## 2012-09-17 ENCOUNTER — Encounter: Payer: Self-pay | Admitting: Neurology

## 2012-09-23 NOTE — Progress Notes (Signed)
Quick Note:  I reviewed the patient's CPAP compliance data from 08/15/2012 to 09/13/2012, which is a total of 30 days, during which time the patient used CPAP every day. The average usage for all days was 5 hours and 59 minutes. The percent used days greater than 4 hours was 90 %, indicating very good compliance. The residual AHI was 2.1 per hour, indicating a good treatment pressure of 10 cwp. I will review this data with the patient at the next visit, provide feedback and additional trouble shooting if need be.  Huston Foley, MD, PhD [facility] ([facility]   ______

## 2012-10-02 ENCOUNTER — Encounter: Payer: Self-pay | Admitting: Neurology

## 2012-11-09 ENCOUNTER — Encounter: Payer: Self-pay | Admitting: Neurology

## 2012-11-09 NOTE — Progress Notes (Signed)
Quick Note:  I reviewed the patient's CPAP compliance data from 08/06/2012 to 11/01/2012, which is a total of 88 days, during which time the patient used CPAP every day. The average usage for all days was 5 hours and 35 minutes. The percent used days greater than 4 hours was 82%, indicating good compliance. The residual AHI was 2.1 per hour, indicating an appropriate treatment pressure of 10 cwp with EPR of 2. I will review this data with the patient at the next office visit, provide feedback and additional troubleshooting if need be.  Huston Foley, MD, PhD [facility] ([facility]   ______

## 2012-11-10 ENCOUNTER — Encounter: Payer: Self-pay | Admitting: Neurology

## 2012-11-12 ENCOUNTER — Other Ambulatory Visit: Payer: Self-pay

## 2013-01-19 ENCOUNTER — Encounter: Payer: Self-pay | Admitting: Neurology

## 2013-01-20 NOTE — Progress Notes (Signed)
Quick Note:  I reviewed the patient's CPAP compliance data from 10/20/2012 to 01/17/2013, which is a total of 90 days, during which time the patient used CPAP every day except for 9 days. The average usage for all days was 4 hours and 3 minutes. The percent used days greater than 4 hours was 58 %, indicating suboptimal compliance. The residual AHI was 1.5 per hour, indicating an appropriate treatment pressure of 10 cwp with EPR of 2. I will review this data with the patient at the next office visit, which is scheduled for 01/28/2013 at 3:30 PM, provide feedback and additional troubleshooting if need be. Her leak was typically not very high. She needs reminding that she needs to adhere to therapy fully and not skip any nights.  Star Age, MD, PhD [facility]   ______

## 2013-01-28 ENCOUNTER — Encounter: Payer: Self-pay | Admitting: Neurology

## 2013-01-28 ENCOUNTER — Ambulatory Visit (INDEPENDENT_AMBULATORY_CARE_PROVIDER_SITE_OTHER): Payer: BC Managed Care – PPO | Admitting: Neurology

## 2013-01-28 VITALS — BP 131/69 | HR 76 | Temp 97.4°F | Ht 65.0 in

## 2013-01-28 DIAGNOSIS — G4761 Periodic limb movement disorder: Secondary | ICD-10-CM

## 2013-01-28 DIAGNOSIS — G4733 Obstructive sleep apnea (adult) (pediatric): Secondary | ICD-10-CM

## 2013-01-28 DIAGNOSIS — Z9989 Dependence on other enabling machines and devices: Principal | ICD-10-CM

## 2013-01-28 NOTE — Patient Instructions (Addendum)
Please continue using your CPAP regularly. While your insurance requires that you use CPAP at least 4 hours each night on 70% of the nights, I recommend, that you not skip any nights and use it throughout the night if you can. Getting used to CPAP does take time and patience and discipline. Untreated obstructive sleep apnea when it is moderate to severe can have an adverse impact on cardiovascular health and raise her risk for heart disease, arrhythmias, hypertension, congestive heart failure, stroke and diabetes. Untreated obstructive sleep apnea causes sleep disruption, nonrestorative sleep, and sleep deprivation. This can have an impact on your day to day functioning and cause daytime sleepiness and impairment of cognitive function, memory loss, mood disturbance, and problems focussing. Using CPAP regularly can improve these symptoms. You can try Melatonin at night for sleep: take 3 to 5 mg one hour before your bedtime to see if it helps you stay asleep better and prevent you from taking your CPAP mask off.

## 2013-01-28 NOTE — Progress Notes (Signed)
Subjective:    Patient ID: Jennifer [name] is a 63 y.o. female.  HPI  Interim history:   Jennifer [name] is a very pleasant 63 year old right-handed woman who presents for followup consultation of her obstructive sleep apnea. She is unaccompanied today. I last saw her on 07/28/2012, at which time I felt that her exam was stable and talked her about her sleep apnea and good compliance. I changed her from AutoPap to a set CPAP pressure of 10. I reviewed her compliance data from 10/20/2012 through 01/17/2013, a total of 90 days during which time she is CPAP every night except for 9 days. Percent used days greater than 4 hours was only 58%, indicating fair compliance. Average usage for all days was 4 hours and 3 minutes. Her residual AHI was 1.5 per hour with an acceptable leak. Her pressure was 10 cm with EPR of 2.   Today, she reports feeling fine, she is trying to use CPAP regularly, but had a cold and took off the nasal pillow inadvertently, and therefore had some erratic hours on CPAP. Generally speaking she has felt better since being on CPAP. She was compliant from the beginning. She has recently restarted pravastatin. Her LDL was 117, and she had a low normal vitamin D recently. She will be restarting her over-the-counter vitamin D supplement. She has been taking Allegra generic every day. She has been on Ambien before but not recently. She also did some traveling around the holidays but did take her machine with her. Nevertheless, she was not as compliant with treatment around holiday time. She fell in November while walking on a walking track. She got distracted by a phone call and fell and bumped her right elbow and right knee. She had no serious injuries. She is working on weight loss. She continues to work full-time.   I first met her on 03/23/2012 at the request of Dr. Everlene Farrier. She had a split-night sleep study on 03/28/2012 and I explained the results to her during our followup visit on  04/24/2012. Her baseline AHI was 32.2 per hour with a baseline oxygen saturation of 90% and a nadir of 84%. She was started on CPAP and titrated from 5-9 cm of water pressure, but above 7 cm she started having central apneas. At a pressure of 7 cm, her AHI was reduced to 9.1 per hour. She had significant period leg movements of sleep but a low associated arousal index. At the time of our visit in April 2014, I suggested a trial of AutoPap at home with pressures ranging from 6-10 cm of water pressure. She was using CPAP and reported better sleep, feeling more rested and adjusted well to it. She felt better with CPAP overall. She started using CPAP on 06/12/12 and turned in the compliance chip on 07/14/12 before her vacation, but did take the machine with her to [place]. She is using nasal pillows, tolerating them well. I reviewed her 30 day compliance data from 06/12/2012 through 07/13/2012, total of 32 days, during which time she used CPAP every day. Her percent used days above 4 hours was 94%, indicating excellent compliance. Her average usage was 6 hours and 9 minutes. Her leak was rather low. Her residual AHI was 2.7 per hour. Her median pressure was 7.7 on her 95th percentile was 9.8 cm, with EPR at 2 cm.  Her Past Medical History Is Significant For: Past Medical History  Diagnosis Date  . Hyperlipidemia   . Osteoarthritis   . OSA (  obstructive sleep apnea) 04/24/2012  . PLMD (periodic limb movement disorder) 04/24/2012    Her Past Surgical History Is Significant For: Past Surgical History  Procedure Laterality Date  . Joint replacement  1998    right knee  . Cholecystectomy  1978  . Cesarean section      Her Family History Is Significant For: Family History  Problem Relation Age of Onset  . Cancer Father 32  . Heart disease Mother 27    Aortic valve disease  . Atrial fibrillation Brother   . Sleep apnea Brother   . Sleep apnea Sister   . Sleep apnea Sister     Her Social History Is  Significant For: History   Social History  . Marital Status: Divorced    Spouse Name: N/A    Number of Children: 1  . Years of Education: N/A   Occupational History  . [employer]    Social History Main Topics  . Smoking status: Never Smoker   . Smokeless tobacco: None  . Alcohol Use: No  . Drug Use: No  . Sexual Activity: No   Other Topics Concern  . None   Social History Narrative   Lives with cats.      Her Allergies Are:  Allergies  Allergen Reactions  . Crestor [Rosuvastatin]   . Neomycin-Bacitracin Zn-Polymyx Swelling    Ophthalmic only(eye swelling)  . Codeine Rash  . Penicillins Rash  . Piroxicam Rash  . Sulfonamide Derivatives Rash  :   Her Current Medications Are:  Outpatient Encounter Prescriptions as of 01/28/2013  Medication Sig  . b complex vitamins tablet Take 1 tablet by mouth daily.    . citalopram (CELEXA) 40 MG tablet TAKE ONE TABLET BY MOUTH DAILY  . co-enzyme Q-10 30 MG capsule Take 30 mg by mouth at bedtime.   . Multiple Vitamins-Minerals (MULTIVITAMINS THER. W/MINERALS) TABS Take 1 tablet by mouth daily.    . Omega-3 Fatty Acids (FISH OIL) 1000 MG CAPS Take 1 capsule by mouth daily.  . pravastatin (PRAVACHOL) 40 MG tablet Take 80 mg by mouth daily.  . [DISCONTINUED] atorvastatin (LIPITOR) 20 MG tablet Take 1 tablet (20 mg total) by mouth daily.  . [DISCONTINUED] cholecalciferol (VITAMIN D) 1000 UNITS tablet Take 2,000 Units by mouth daily after breakfast.    . [DISCONTINUED] clotrimazole-betamethasone (LOTRISONE) cream Apply topically 2 (two) times daily.  :  Review of Systems:  Out of a complete 14 point review of systems, all are reviewed and negative with the exception of these symptoms as listed below:   Review of Systems  Constitutional: Negative.   HENT: Negative.   Eyes: Negative.   Respiratory: Negative.   Cardiovascular: Negative.   Gastrointestinal: Negative.   Endocrine: Negative.   Genitourinary: Negative.    Musculoskeletal: Negative.   Skin: Negative.   Allergic/Immunologic: Negative.   Neurological: Negative.   Hematological: Negative.   Psychiatric/Behavioral: Negative.   All other systems reviewed and are negative.   Objective:  Neurologic Exam  Physical Exam Physical Examination:   Filed Vitals:   01/28/13 1525  BP: 131/69  Pulse: 76  Temp: 97.4 F (36.3 C)   General Examination: The patient is a very pleasant 63 y.o. female in no acute distress. She appears well-developed and well-nourished and well groomed. She is in good spirits today.  HEENT exam: Normocephalic, atraumatic, pupils are equal, round and reactive to light and accommodation. Funduscopic exam is normal with sharp disc margins noted. Extraocular tracking is good without nystagmus noted.  Normal smooth pursuit is noted. Hearing is grossly intact. Face is symmetric with normal facial animation and normal facial sensation. Speech is clear with no dysarthria noted. There is no lip, neck or jaw tremor. Neck is supple with full range of motion. Oropharynx exam reveals  a mildly narrow airway and mild mouth dryness. Uvula is normal in size. Tongue is normal in size. Mallampati is class II. Tongue protrudes centrally and palate elevates symmetrically. Tonsils are 1+. Chest is clear to auscultation without wheezing, rhonchi or crackles noted. Heart sounds are normal without murmurs, rubs or gallops noted. Abdomen is soft, nontender with normal bowel sounds appreciated. There is no pitting edema in the distal lower extremities bilaterally. Skin is warm and dry with no trophic changes noted. Musculoskeletal exam reveals no obvious joint deformities or joint swelling. Neurologically: Mental status: The patient is awake, alert and oriented in all 4 spheres. Her Memory, attention, language and knowledge are appropriate. There is no aphasia, agnosia, apraxia or anomia. Cranial nerves are as described above under HEENT exam. In  addition, shoulder shrug is normal. Motor exam: Normal bulk, strength and tone is noted. There is no drift, tremor or rebound. Romberg is negative. Reflexes are 2+ throughout. Toes are downgoing bilaterally. Fine motor skills are intact with normal finger taps, normal hand movements, normal rapid alternating patting, normal foot taps and normal foot agility. Cerebellar testing shows no dysmetria or intention tremor. There is no truncal or gait ataxia. Sensory exam is intact to light touch in the upper and lower extremities. Gait, station and balance are unremarkable. Posture is age-appropriate and stance is narrow based. No problems turning are noted. Tandem walk is unremarkable. She is able to pull herself up on her toes and heels.        Assessment and Plan:   In summary, Jennifer [name] is a delightful 63 year old female with a history of OSA, on CPAP treatment at a pressure of 10 cmp with EPR of 2. Her physical exam is stable and she indicates good results with the use of CPAP, and good tolerance of the pressure and mask. I reviewed the compliance data with the patient and encouraged her to continue to use CPAP regularly to help reduce cardiovascular risk. I encouraged her to be fully compliant with therapy.  to help her with her nighttime sleep and perhaps prevent her from pulling off her mask and it worked only at night I suggested a trial of over-the-counter melatonin, 3-5 mg, one hour before bedtime.  We also talked about trying to maintaining a healthy lifestyle in general. I encouraged the patient to eat healthy, exercise daily and keep well hydrated, to keep a scheduled bedtime and wake time routine, to not skip any meals and eat healthy snacks in between meals and to have protein with every meal. I stressed the importance of regular exercise. I answered all her questions today and the patient was advised to come back to see me in 12 months since she is stable. I  would like to see you  compliance download in June or July of this year. She indicates that there is a compliance data chip in the machine at this time and she can take it into her DME company. She was in agreement with the plan.

## 2013-02-27 ENCOUNTER — Other Ambulatory Visit: Payer: Self-pay | Admitting: Physician Assistant

## 2013-03-02 ENCOUNTER — Ambulatory Visit (INDEPENDENT_AMBULATORY_CARE_PROVIDER_SITE_OTHER): Payer: BC Managed Care – PPO | Admitting: Family Medicine

## 2013-03-02 DIAGNOSIS — Z713 Dietary counseling and surveillance: Secondary | ICD-10-CM

## 2013-03-04 ENCOUNTER — Telehealth: Payer: Self-pay

## 2013-03-04 ENCOUNTER — Other Ambulatory Visit: Payer: Self-pay | Admitting: Physician Assistant

## 2013-03-04 MED ORDER — CITALOPRAM HYDROBROMIDE 40 MG PO TABS: 40.0000 mg | ORAL_TABLET | Freq: Every day | ORAL | Status: DC

## 2013-03-04 NOTE — Telephone Encounter (Signed)
Pt reported that the pharm did not receive the citalopram RF we sent last week. Checked w/Dr Everlene Farrier who gave the OK to RF through the remaining of the year since CPE. Resent RFs.

## 2013-06-19 ENCOUNTER — Other Ambulatory Visit: Payer: Self-pay | Admitting: Family Medicine

## 2013-06-19 DIAGNOSIS — E785 Hyperlipidemia, unspecified: Secondary | ICD-10-CM

## 2013-06-19 DIAGNOSIS — E559 Vitamin D deficiency, unspecified: Secondary | ICD-10-CM

## 2013-06-19 DIAGNOSIS — Z79899 Other long term (current) drug therapy: Secondary | ICD-10-CM

## 2013-06-19 DIAGNOSIS — R634 Abnormal weight loss: Secondary | ICD-10-CM

## 2013-06-21 ENCOUNTER — Telehealth: Payer: Self-pay | Admitting: Internal Medicine

## 2013-06-24 ENCOUNTER — Encounter: Payer: BC Managed Care – PPO | Admitting: Emergency Medicine

## 2013-06-25 ENCOUNTER — Encounter: Payer: Self-pay | Admitting: Internal Medicine

## 2013-06-25 ENCOUNTER — Other Ambulatory Visit (INDEPENDENT_AMBULATORY_CARE_PROVIDER_SITE_OTHER): Payer: BC Managed Care – PPO | Admitting: *Deleted

## 2013-06-25 DIAGNOSIS — E559 Vitamin D deficiency, unspecified: Secondary | ICD-10-CM

## 2013-06-25 DIAGNOSIS — Z79899 Other long term (current) drug therapy: Secondary | ICD-10-CM

## 2013-06-25 DIAGNOSIS — R634 Abnormal weight loss: Secondary | ICD-10-CM

## 2013-06-25 DIAGNOSIS — E785 Hyperlipidemia, unspecified: Secondary | ICD-10-CM

## 2013-06-25 LAB — LIPID PANEL
CHOL/HDL RATIO: 5.3 ratio
Cholesterol: 226 mg/dL — ABNORMAL HIGH (ref 0–200)
HDL: 43 mg/dL (ref >39)
LDL CALC: 147 mg/dL — AB (ref 0–99)
Triglycerides: 181 mg/dL — ABNORMAL HIGH (ref <150)
VLDL: 36 mg/dL (ref 0–40)

## 2013-06-25 LAB — CBC WITH DIFFERENTIAL/PLATELET
BASOS ABS: 0 10*3/uL (ref 0.0–0.1)
Basophils Relative: 1 % (ref 0–1)
EOS ABS: 0.1 10*3/uL (ref 0.0–0.7)
EOS PCT: 2 % (ref 0–5)
HCT: 38 % (ref 36.0–46.0)
Hemoglobin: 13.4 g/dL (ref 12.0–15.0)
Lymphocytes Relative: 26 % (ref 12–46)
Lymphs Abs: 1 10*3/uL (ref 0.7–4.0)
MCH: 30.2 pg (ref 26.0–34.0)
MCHC: 35.3 g/dL (ref 30.0–36.0)
MCV: 85.6 fL (ref 78.0–100.0)
MONO ABS: 0.4 10*3/uL (ref 0.1–1.0)
Monocytes Relative: 10 % (ref 3–12)
NEUTROS ABS: 2.4 10*3/uL (ref 1.7–7.7)
Neutrophils Relative %: 61 % (ref 43–77)
Platelets: 316 10*3/uL (ref 150–400)
RBC: 4.44 MIL/uL (ref 3.87–5.11)
RDW: 13.2 % (ref 11.5–15.5)
WBC: 3.9 10*3/uL — ABNORMAL LOW (ref 4.0–10.5)

## 2013-06-25 LAB — COMPREHENSIVE METABOLIC PANEL
ALK PHOS: 96 U/L (ref 39–117)
ALT: 15 U/L (ref 0–35)
AST: 20 U/L (ref 0–37)
Albumin: 4.7 g/dL (ref 3.5–5.2)
BILIRUBIN TOTAL: 0.6 mg/dL (ref 0.2–1.2)
BUN: 10 mg/dL (ref 6–23)
CO2: 25 mEq/L (ref 19–32)
CREATININE: 0.69 mg/dL (ref 0.50–1.10)
Calcium: 9.8 mg/dL (ref 8.4–10.5)
Chloride: 99 mEq/L (ref 96–112)
Glucose, Bld: 85 mg/dL (ref 70–99)
Potassium: 4.3 mEq/L (ref 3.5–5.3)
Sodium: 139 mEq/L (ref 135–145)
Total Protein: 7.5 g/dL (ref 6.0–8.3)

## 2013-06-25 LAB — TSH: TSH: 5.293 u[IU]/mL — ABNORMAL HIGH (ref 0.350–4.500)

## 2013-06-26 LAB — VITAMIN D 25 HYDROXY (VIT D DEFICIENCY, FRACTURES): VIT D 25 HYDROXY: 35 ng/mL (ref 30–89)

## 2013-06-28 ENCOUNTER — Other Ambulatory Visit: Payer: Self-pay | Admitting: Physician Assistant

## 2013-07-02 NOTE — Telephone Encounter (Signed)
appt is scheduled  

## 2013-07-08 ENCOUNTER — Ambulatory Visit (INDEPENDENT_AMBULATORY_CARE_PROVIDER_SITE_OTHER): Payer: BC Managed Care – PPO | Admitting: Emergency Medicine

## 2013-07-08 ENCOUNTER — Encounter: Payer: Self-pay | Admitting: Emergency Medicine

## 2013-07-08 VITALS — BP 124/74 | HR 68 | Temp 98.1°F | Resp 16 | Ht 64.0 in | Wt 180.8 lb

## 2013-07-08 DIAGNOSIS — R9431 Abnormal electrocardiogram [ECG] [EKG]: Secondary | ICD-10-CM

## 2013-07-08 DIAGNOSIS — Z0000 Encounter for general adult medical examination without abnormal findings: Secondary | ICD-10-CM

## 2013-07-08 LAB — POCT URINALYSIS DIPSTICK
Bilirubin, UA: NEGATIVE
Glucose, UA: NEGATIVE
Ketones, UA: NEGATIVE
LEUKOCYTES UA: NEGATIVE
NITRITE UA: NEGATIVE
PH UA: 7
PROTEIN UA: NEGATIVE
Spec Grav, UA: <=1.005
Urobilinogen, UA: 0.2

## 2013-07-08 NOTE — Progress Notes (Signed)
Subjective:  This chart was scribed for Darlyne Russian, MD by Ladene Artist, ED Scribe. The patient was seen in room 22. Patient's care was started at 11:10 AM.   Patient ID: Jennifer [name], female    DOB: 04/10/1950, 63 y.o.   MRN: [id number]  Chief Complaint  Patient presents with  . Annual Exam   HPI HPI Comments: Jennifer [name] is a 63 y.o. female who presents to the [facility] for an annual exam. Pt reports constipation that she attributes to Weight Watchers. Pt states that she drinks a 12 pack of Diet Pepsi per day. She has not tried anything for constipation. Next colonoscopy is 09/10/13. She denies associated abdominal pain and arthralgias at this time. Pt has tried ibuprofen for arthralgia with relief.  Pt reports a few dry patches on her legs, but does not express concern about the areas.  Pt uses a cpap nightly since being diagnosed with sleep apnea. Pt states that she feels well rested upon waking in the mornings.  Last mammogram was this morning. Next eye appointment is today at 1 PM. Pt does not see OB/GYN annually. Shingles vaccine 2 years ago. Tdap is unknown.   Neurologist: Dr. Rexene Alberts at [facility] Neurology Orthopedist: Dr. Mayer Camel  Optometrist: Dr. Kenton Kingfisher at [facility] in [place], [place] Gastrologist: Dr. Carlean Purl  Pt plans to continue working for another 4 years. She plans to move to [place] following retirement.   Past Medical History  Diagnosis Date  . Hyperlipidemia   . Osteoarthritis   . OSA (obstructive sleep apnea) 04/24/2012  . PLMD (periodic limb movement disorder) 04/24/2012   Current Outpatient Prescriptions on File Prior to Visit  Medication Sig Dispense Refill  . citalopram (CELEXA) 40 MG tablet Take 1 tablet (40 mg total) by mouth daily.  30 tablet  3  . Multiple Vitamins-Minerals (MULTIVITAMINS THER. W/MINERALS) TABS Take 1 tablet by mouth daily.        . pravastatin (PRAVACHOL) 40 MG tablet Take 80 mg by mouth  daily.      Marland Kitchen b complex vitamins tablet Take 1 tablet by mouth daily.        Marland Kitchen co-enzyme Q-10 30 MG capsule Take 30 mg by mouth at bedtime.       . Omega-3 Fatty Acids (FISH OIL) 1000 MG CAPS Take 1 capsule by mouth daily.      . pravastatin (PRAVACHOL) 40 MG tablet TAKE TWO TABLETS BY MOUTH EVERY DAY  180 tablet  0   Current Facility-Administered Medications on File Prior to Visit  Medication Dose Route Frequency Provider Last Rate Last Dose  . TDaP (BOOSTRIX) injection 0.5 mL  0.5 mL Intramuscular Once Darlyne Russian, MD       Allergies  Allergen Reactions  . Crestor [Rosuvastatin]   . Neomycin-Bacitracin Zn-Polymyx Swelling    Ophthalmic only(eye swelling)  . Codeine Rash  . Penicillins Rash  . Piroxicam Rash  . Sulfonamide Derivatives Rash   Review of Systems  Gastrointestinal: Positive for constipation. Negative for abdominal pain.  Musculoskeletal: Negative for arthralgias.      Objective:   Physical Exam CONSTITUTIONAL: Well developed/well nourished HEAD: Normocephalic/atraumatic EYES: EOMI/PERRL ENMT: Mucous membranes moist NECK: supple no meningeal signs SPINE:entire spine nontender CV: S1/S2 noted, no murmurs/rubs/gallops noted LUNGS: Lungs are clear to auscultation bilaterally, no apparent distress ABDOMEN: soft, nontender, no rebound or guarding GU:no cva tenderness NEURO: Pt is awake/alert, moves all extremitiesx4 EXTREMITIES: pulses normal, full ROM SKIN: warm,  color normal, scar over R knee, multiple benign appearing moles on trunk and extremity PSYCH: no abnormalities of mood noted     Assessment & Plan:  She will continue Weight Watchers. Her white count was slightly low elevated TSH and cholesterol not under control. She has recent her statin. We'll recheck in 4 months repeat thyroid studies CBC and lipid panel at that time. She is scheduled for a colonoscopy in September. She will use [employer] for her constipation problem I personally performed the  services described in this documentation, which was scribed in my presence. The recorded information has been reviewed and is accurate.

## 2013-07-08 NOTE — Patient Instructions (Signed)

## 2013-07-16 ENCOUNTER — Encounter: Payer: Self-pay | Admitting: Emergency Medicine

## 2013-07-28 ENCOUNTER — Other Ambulatory Visit: Payer: Self-pay | Admitting: Emergency Medicine

## 2013-07-28 NOTE — Telephone Encounter (Signed)
Dr Everlene Farrier, pt just had CPE but don't see this med discussed. Can we RF?

## 2013-08-26 ENCOUNTER — Ambulatory Visit (AMBULATORY_SURGERY_CENTER): Payer: BC Managed Care – PPO | Admitting: *Deleted

## 2013-08-26 VITALS — Ht 63.0 in | Wt 179.4 lb

## 2013-08-26 DIAGNOSIS — Z86010 Personal history of colonic polyps: Secondary | ICD-10-CM

## 2013-08-26 MED ORDER — NA SULFATE-K SULFATE-MG SULF 17.5-3.13-1.6 GM/177ML PO SOLN: ORAL | Status: DC

## 2013-08-26 NOTE — Progress Notes (Signed)
No egg or soy allergy  No intubation problems per pt  No diet medications taken  Registered in [place]  Pt has artificial right knee   Pt states she has had occasions where she had trouble waking up after anesthesia

## 2013-08-27 ENCOUNTER — Encounter: Payer: BC Managed Care – PPO | Admitting: Internal Medicine

## 2013-08-29 ENCOUNTER — Emergency Department (HOSPITAL_COMMUNITY): Payer: BC Managed Care – PPO

## 2013-08-29 ENCOUNTER — Encounter (HOSPITAL_COMMUNITY): Payer: Self-pay | Admitting: Radiology

## 2013-08-29 ENCOUNTER — Inpatient Hospital Stay (HOSPITAL_COMMUNITY): Payer: BC Managed Care – PPO

## 2013-08-29 ENCOUNTER — Inpatient Hospital Stay (HOSPITAL_COMMUNITY)
Admission: EM | Admit: 2013-08-29 | Discharge: 2013-09-01 | DRG: 101 | Disposition: A | Discharge status: Home Health | Payer: BC Managed Care – PPO | Attending: General Surgery | Admitting: General Surgery

## 2013-08-29 DIAGNOSIS — F329 Major depressive disorder, single episode, unspecified: Secondary | ICD-10-CM | POA: Diagnosis present

## 2013-08-29 DIAGNOSIS — E871 Hypo-osmolality and hyponatremia: Secondary | ICD-10-CM | POA: Diagnosis present

## 2013-08-29 DIAGNOSIS — R561 Post traumatic seizures: Secondary | ICD-10-CM | POA: Diagnosis present

## 2013-08-29 DIAGNOSIS — R569 Unspecified convulsions: Secondary | ICD-10-CM

## 2013-08-29 DIAGNOSIS — E7800 Pure hypercholesterolemia, unspecified: Secondary | ICD-10-CM | POA: Diagnosis present

## 2013-08-29 DIAGNOSIS — G4730 Sleep apnea, unspecified: Secondary | ICD-10-CM | POA: Diagnosis present

## 2013-08-29 DIAGNOSIS — F3289 Other specified depressive episodes: Secondary | ICD-10-CM | POA: Diagnosis present

## 2013-08-29 HISTORY — DX: Unspecified convulsions: R56.9

## 2013-08-29 HISTORY — DX: Pure hypercholesterolemia, unspecified: E78.00

## 2013-08-29 LAB — URINALYSIS, ROUTINE W REFLEX MICROSCOPIC
BILIRUBIN URINE: NEGATIVE
GLUCOSE, UA: NEGATIVE mg/dL
Ketones, ur: NEGATIVE mg/dL
Leukocytes, UA: NEGATIVE
Nitrite: NEGATIVE
Protein, ur: NEGATIVE mg/dL
Specific Gravity, Urine: 1.015 (ref 1.005–1.030)
Urobilinogen, UA: 0.2 mg/dL (ref 0.0–1.0)
pH: 7.5 (ref 5.0–8.0)

## 2013-08-29 LAB — COMPREHENSIVE METABOLIC PANEL
ALT: 17 U/L (ref 0–35)
AST: 22 U/L (ref 0–37)
Albumin: 4.1 g/dL (ref 3.5–5.2)
Alkaline Phosphatase: 95 U/L (ref 39–117)
Anion gap: 18 — ABNORMAL HIGH (ref 5–15)
BILIRUBIN TOTAL: 0.5 mg/dL (ref 0.3–1.2)
BUN: 11 mg/dL (ref 6–23)
CALCIUM: 9.3 mg/dL (ref 8.4–10.5)
CHLORIDE: 88 meq/L — AB (ref 96–112)
CO2: 20 meq/L (ref 19–32)
CREATININE: 0.53 mg/dL (ref 0.50–1.10)
GLUCOSE: 115 mg/dL — AB (ref 70–99)
Potassium: 3.3 mEq/L — ABNORMAL LOW (ref 3.7–5.3)
Sodium: 126 mEq/L — ABNORMAL LOW (ref 137–147)
Total Protein: 7.7 g/dL (ref 6.0–8.3)

## 2013-08-29 LAB — CBC
HCT: 34.3 % — ABNORMAL LOW (ref 36.0–46.0)
Hemoglobin: 11.8 g/dL — ABNORMAL LOW (ref 12.0–15.0)
MCH: 29.6 pg (ref 26.0–34.0)
MCHC: 34.4 g/dL (ref 30.0–36.0)
MCV: 86 fL (ref 78.0–100.0)
PLATELETS: 259 10*3/uL (ref 150–400)
RBC: 3.99 MIL/uL (ref 3.87–5.11)
RDW: 12.1 % (ref 11.5–15.5)
WBC: 7.3 10*3/uL (ref 4.0–10.5)

## 2013-08-29 LAB — PROTIME-INR
INR: 1 (ref 0.00–1.49)
Prothrombin Time: 13.2 seconds (ref 11.6–15.2)

## 2013-08-29 LAB — SAMPLE TO BLOOD BANK

## 2013-08-29 LAB — URINE MICROSCOPIC-ADD ON

## 2013-08-29 LAB — ETHANOL

## 2013-08-29 LAB — TROPONIN I: Troponin I: <0.3 ng/mL (ref <0.30)

## 2013-08-29 MED ORDER — SODIUM CHLORIDE 0.9 % IV SOLN
INTRAVENOUS | Status: AC | PRN
  Administered 2013-08-29: 10 mL/h via INTRAVENOUS

## 2013-08-29 MED ORDER — IOHEXOL 300 MG/ML  SOLN
100.0000 mL | Freq: Once | INTRAMUSCULAR | Status: AC | PRN
  Administered 2013-08-29: 100 mL via INTRAVENOUS

## 2013-08-29 MED ORDER — PROMETHAZINE HCL 25 MG/ML IJ SOLN
12.5000 mg | Freq: Three times a day (TID) | INTRAMUSCULAR | Status: DC | PRN
  Filled 2013-08-29: qty 1

## 2013-08-29 MED ORDER — LORAZEPAM 2 MG/ML IJ SOLN
2.0000 mg | Freq: Once | INTRAMUSCULAR | Status: AC
  Administered 2013-08-29: 2 mg via INTRAVENOUS
  Filled 2013-08-29: qty 1

## 2013-08-29 MED ORDER — LEVETIRACETAM IN NACL 1000 MG/100ML IV SOLN
1000.0000 mg | Freq: Once | INTRAVENOUS | Status: AC
  Administered 2013-08-30: 1000 mg via INTRAVENOUS
  Filled 2013-08-29: qty 100

## 2013-08-29 MED ORDER — PROMETHAZINE HCL 25 MG/ML IJ SOLN
12.5000 mg | Freq: Once | INTRAMUSCULAR | Status: AC
  Administered 2013-08-29: 12.5 mg via INTRAVENOUS
  Filled 2013-08-29: qty 1

## 2013-08-29 NOTE — ED Notes (Signed)
Family updated as to patient's status.
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
## 2013-08-29 NOTE — ED Notes (Signed)
Patient transported to CT with RN and Dr. Marlou Starks
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
## 2013-08-29 NOTE — ED Notes (Signed)
Family at beside. Family given emotional support. 
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
## 2013-08-30 ENCOUNTER — Inpatient Hospital Stay (HOSPITAL_COMMUNITY): Payer: BC Managed Care – PPO

## 2013-08-30 ENCOUNTER — Encounter (HOSPITAL_COMMUNITY): Payer: Self-pay | Admitting: *Deleted

## 2013-08-30 DIAGNOSIS — E871 Hypo-osmolality and hyponatremia: Secondary | ICD-10-CM

## 2013-08-30 DIAGNOSIS — R561 Post traumatic seizures: Secondary | ICD-10-CM

## 2013-08-30 LAB — TROPONIN I

## 2013-08-30 LAB — PREPARE FRESH FROZEN PLASMA
UNIT DIVISION: 0
Unit division: 0

## 2013-08-30 LAB — BASIC METABOLIC PANEL
Anion gap: 13 (ref 5–15)
BUN: 8 mg/dL (ref 6–23)
CO2: 25 mEq/L (ref 19–32)
Calcium: 9 mg/dL (ref 8.4–10.5)
Chloride: 92 mEq/L — ABNORMAL LOW (ref 96–112)
Creatinine, Ser: 0.46 mg/dL — ABNORMAL LOW (ref 0.50–1.10)
Glucose, Bld: 108 mg/dL — ABNORMAL HIGH (ref 70–99)
Potassium: 4.3 mEq/L (ref 3.7–5.3)
SODIUM: 130 meq/L — AB (ref 137–147)

## 2013-08-30 LAB — CDS SEROLOGY

## 2013-08-30 LAB — MRSA PCR SCREENING: MRSA BY PCR: NEGATIVE

## 2013-08-30 LAB — CBC
HCT: 31.9 % — ABNORMAL LOW (ref 36.0–46.0)
HEMOGLOBIN: 11.3 g/dL — AB (ref 12.0–15.0)
MCH: 29.7 pg (ref 26.0–34.0)
MCHC: 35.4 g/dL (ref 30.0–36.0)
MCV: 83.9 fL (ref 78.0–100.0)
Platelets: 269 10*3/uL (ref 150–400)
RBC: 3.8 MIL/uL — ABNORMAL LOW (ref 3.87–5.11)
RDW: 12 % (ref 11.5–15.5)
WBC: 12.6 10*3/uL — ABNORMAL HIGH (ref 4.0–10.5)

## 2013-08-30 MED ORDER — LEVETIRACETAM IN NACL 500 MG/100ML IV SOLN
500.0000 mg | Freq: Two times a day (BID) | INTRAVENOUS | Status: DC
  Administered 2013-08-30 (×2): 500 mg via INTRAVENOUS
  Filled 2013-08-30 (×5): qty 100

## 2013-08-30 MED ORDER — MORPHINE SULFATE 2 MG/ML IJ SOLN: 2.0000 mg | INTRAMUSCULAR | Status: DC | PRN

## 2013-08-30 MED ORDER — KCL IN DEXTROSE-NACL 20-5-0.9 MEQ/L-%-% IV SOLN: INTRAVENOUS | Status: DC

## 2013-08-30 MED ORDER — ONDANSETRON HCL 4 MG/2ML IJ SOLN: 4.0000 mg | Freq: Four times a day (QID) | INTRAMUSCULAR | Status: DC | PRN

## 2013-08-30 MED ORDER — SODIUM CHLORIDE 0.9 % IV SOLN: INTRAVENOUS | Status: DC

## 2013-08-30 MED ORDER — PANTOPRAZOLE SODIUM 40 MG PO TBEC
40.0000 mg | DELAYED_RELEASE_TABLET | Freq: Every day | ORAL | Status: DC
  Administered 2013-08-31 – 2013-09-01 (×2): 40 mg via ORAL
  Filled 2013-08-30 (×2): qty 1

## 2013-08-30 MED ORDER — ONDANSETRON HCL 4 MG PO TABS: 4.0000 mg | ORAL_TABLET | Freq: Four times a day (QID) | ORAL | Status: DC | PRN

## 2013-08-30 MED ORDER — PANTOPRAZOLE SODIUM 40 MG IV SOLR
40.0000 mg | Freq: Every day | INTRAVENOUS | Status: DC
  Administered 2013-08-30: 40 mg via INTRAVENOUS
  Filled 2013-08-30 (×2): qty 40

## 2013-08-30 MED ORDER — SODIUM CHLORIDE 0.9 % IV SOLN
INTRAVENOUS | Status: DC
  Administered 2013-08-30: 08:00:00 via INTRAVENOUS
  Administered 2013-08-31: 50 mL via INTRAVENOUS

## 2013-08-30 MED ORDER — POTASSIUM CHLORIDE IN NACL 20-0.9 MEQ/L-% IV SOLN
INTRAVENOUS | Status: DC
  Administered 2013-08-30: 03:00:00 via INTRAVENOUS
  Filled 2013-08-30: qty 1000

## 2013-08-30 NOTE — Progress Notes (Signed)
UR completed.  Grigor Lipschutz, RN BSN MHA CCM Trauma/Neuro ICU Case Manager [phone]  
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
## 2013-08-30 NOTE — ED Notes (Signed)
All personal belongings given to family to take home.
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
## 2013-08-30 NOTE — ED Notes (Signed)
Pt reports having "electrical currents" traveling through her chest x2 weeks, pt states they are intermittent, occur at different times of the day, and
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
## 2013-08-30 NOTE — ED Notes (Signed)
Patients son Jennifer [name] [phone] has been notified of patients status. Larene Beach [phone] alternate contact for son.
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
## 2013-08-30 NOTE — ED Notes (Signed)
Seizure pads in place

## 2013-08-30 NOTE — ED Notes (Signed)
Pt reaching for NRB; pt attempting to open eyes when asked.

## 2013-08-30 NOTE — ED Notes (Signed)
Report given to Tim,RN.

## 2013-08-30 NOTE — Progress Notes (Signed)
EEG completed; results pending.    

## 2013-08-31 MED ORDER — LEVETIRACETAM 500 MG PO TABS
500.0000 mg | ORAL_TABLET | Freq: Two times a day (BID) | ORAL | Status: DC
  Administered 2013-08-31 – 2013-09-01 (×3): 500 mg via ORAL
  Filled 2013-08-31 (×4): qty 1

## 2013-08-31 MED ORDER — MAGIC MOUTHWASH
15.0000 mL | Freq: Four times a day (QID) | ORAL | Status: DC | PRN
  Administered 2013-08-31: 15 mL via ORAL
  Filled 2013-08-31: qty 15

## 2013-08-31 MED ORDER — DOCUSATE SODIUM 100 MG PO CAPS
100.0000 mg | ORAL_CAPSULE | Freq: Two times a day (BID) | ORAL | Status: DC
  Administered 2013-08-31 – 2013-09-01 (×3): 100 mg via ORAL
  Filled 2013-08-31 (×3): qty 1
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
## 2013-08-31 NOTE — Discharge Instructions (Signed)
Recommend:  1) Continue Keppra 500 mg by mouth twice a day 2) Follow up with Neurology as outpatient  3) No driving, operating heavy machinery, perform activities at heights, swimming or participation in water activities (no more than1inch depth of water) until release by outpatient physician (usually 6 months). This has been discussed with patient.    Seizure, Adult A seizure means there is unusual activity in the brain. A seizure can cause changes in attention or behavior. Seizures often cause shaking (convulsions). Seizures often last from 30 seconds to 2 minutes. HOME CARE   If you are given medicines, take them exactly as told by your doctor.  Keep all doctor visits as told.  Do not swim or drive until your doctor says it is okay.  Teach others what to do if you have a seizure. They should:  Lay you on the ground.  Put a cushion under your head.  Loosen any tight clothing around your neck.  Turn you on your side.  Stay with you until you get better. GET HELP RIGHT AWAY IF:   The seizure lasts longer than 2 to 5 minutes.  The seizure is very bad.  The person does not wake up after the seizure.  The person's attention or behavior changes. Drive the person to the emergency room or call your local emergency services (911 in [place] MAKE SURE YOU:   Understand these instructions.  Will watch your condition.  Will get help right away if you are not doing well or get worse. Document Released: 06/12/2007 Document Revised: 03/18/2011 Document Reviewed: 12/12/2010 [facility] Patient Information 2015 [place], [place]. This information is not intended to replace advice given to you by your health care provider. Make sure you discuss any questions you have with your health care provider.   Seizure, Adult A seizure is abnormal electrical activity in the brain. Seizures usually last from 30 seconds to 2 minutes. There are various types of seizures. Before a seizure, you may have a  warning sensation (aura) that a seizure is about to occur. An aura may include the following symptoms:   Fear or anxiety.  Nausea.  Feeling like the room is spinning (vertigo).  Vision changes, such as seeing flashing lights or spots. Common symptoms during a seizure include:  A change in attention or behavior (altered mental status).  Convulsions with rhythmic jerking movements.  Drooling.  Rapid eye movements.  Grunting.  Loss of bladder and bowel control.  Bitter taste in the mouth.  Tongue biting. After a seizure, you may feel confused and sleepy. You may also have an injury resulting from convulsions during the seizure. HOME CARE INSTRUCTIONS   If you are given medicines, take them exactly as prescribed by your health care provider.  Keep all follow-up appointments as directed by your health care provider.  Do not swim or drive or engage in risky activity during which a seizure could cause further injury to you or others until your health care provider says it is OK.  Get adequate rest.  Teach friends and family what to do if you have a seizure. They should:  Lay you on the ground to prevent a fall.  Put a cushion under your head.  Loosen any tight clothing around your neck.  Turn you on your side. If vomiting occurs, this helps keep your airway clear.  Stay with you until you recover.  Know whether or not you need emergency care. SEEK IMMEDIATE MEDICAL CARE IF:  The seizure lasts longer  than 5 minutes.  The seizure is severe or you do not wake up immediately after the seizure.  You have an altered mental status after the seizure.  You are having more frequent or worsening seizures. Someone should drive you to the emergency department or call local emergency services (911 in [place] MAKE SURE YOU:  Understand these instructions.  Will watch your condition.  Will get help right away if you are not doing well or get worse. Document Released:  12/22/1999 Document Revised: 10/14/2012 Document Reviewed: 08/05/2012 [facility] Patient Information 2015 [place], [place]. This information is not intended to replace advice given to you by your health care provider. Make sure you discuss any questions you have with your health care provider.

## 2013-08-31 NOTE — Progress Notes (Signed)
Looks great and much more alert today.  Still not ready for discharge today according to PT.  Was thinking about getting a Rehab screen, but will wait until tomorrow if necessary.  This patient has been seen and I agree with the findings and treatment plan.  Kathryne Eriksson. Dahlia Bailiff, MD, [place] [phone] (pager) [phone] (direct pager) Trauma Surgeon

## 2013-08-31 NOTE — Progress Notes (Signed)
Rehab Admissions Coordinator Note:  Patient was screened by Cleatrice Burke for appropriateness for an Inpatient Acute Rehab Consult per Trauma PA request.   At this time, we are recommending HH. With 24/7 support of family. P.T. Recommends HH and pt min assist 140 feet without AD using hall rails. [employer]BS will not approve inpt rehab at this current functional level. Please call me with any questions.   Cleatrice Burke 08/31/2013, 12:35 PM  I can be reached at [phone].
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
## 2013-09-01 LAB — BASIC METABOLIC PANEL
ANION GAP: 14 (ref 5–15)
BUN: 11 mg/dL (ref 6–23)
CHLORIDE: 100 meq/L (ref 96–112)
CO2: 24 meq/L (ref 19–32)
CREATININE: 0.51 mg/dL (ref 0.50–1.10)
Calcium: 8.7 mg/dL (ref 8.4–10.5)
GFR calc Af Amer: >90 mL/min (ref >90)
GFR calc non Af Amer: >90 mL/min (ref >90)
GLUCOSE: 88 mg/dL (ref 70–99)
Potassium: 4.4 mEq/L (ref 3.7–5.3)
Sodium: 138 mEq/L (ref 137–147)

## 2013-09-01 MED ORDER — OXYCODONE HCL 5 MG PO TABS: 5.0000 mg | ORAL_TABLET | Freq: Four times a day (QID) | ORAL | Status: DC | PRN

## 2013-09-01 MED ORDER — LEVETIRACETAM 500 MG PO TABS: 500.0000 mg | ORAL_TABLET | Freq: Two times a day (BID) | ORAL | Status: DC

## 2013-09-01 MED ORDER — DSS 100 MG PO CAPS: 100.0000 mg | ORAL_CAPSULE | Freq: Two times a day (BID) | ORAL | Status: DC | PRN

## 2013-09-01 NOTE — Progress Notes (Signed)
Pt requested pain medicine. The only medicine available at this time is morphine. Pt doesn't want a medication that strong; she normally takes Motrin at home for pain and requests that. Trauma PA contacted, states that he will look at the pt's chart. No new orders received at this time.
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
## 2013-09-01 NOTE — Progress Notes (Signed)
Discharge orders received. Pt for discharge home today. IV d/c'd. Pt given discharge instructions and prescriptions with verbalized understanding. Friend in room to assist with discharge. Staff brought pt downstairs via wheelchair.
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
## 2013-09-01 NOTE — Care Management Note (Signed)
  Page 1 of 1   09/01/2013     3:31:31 PM CARE MANAGEMENT NOTE 09/01/2013  Patient:  Jennifer [name], Jennifer [name]   Account Number:  [id number]  Date Initiated:  09/01/2013  Documentation initiated by:  Magdalen Spatz  Subjective/Objective Assessment:     Action/Plan:   Anticipated DC Date:     Anticipated DC Plan:           Choice offered to / List presented to:  C-1 Patient           Status of service:   Medicare Important Message given?   (If response is "NO", the following Medicare IM given date fields will be blank) Date Medicare IM given:   Medicare IM given by:   Date Additional Medicare IM given:   Additional Medicare IM given by:    Discharge Disposition:    Per UR Regulation:    If discussed at Long Length of Stay Meetings, dates discussed:    Comments:  09-01-13 Due to insurance reasons , cannot have home health and outpatient PT at same time . Outpatient PT also set up at  [facility] on [address]. Magdalen Spatz RN BSN [phone]   09-01-13 Discussed with patient and family at bedside set up with [facility] on [address]. Magdalen Spatz RN BSN
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
## 2013-09-01 NOTE — Progress Notes (Signed)
Trauma wanted to check lab values before letting pt go home. Labs have resulted; Trauma paged. Waiting for response from the PA.
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
## 2013-09-02 ENCOUNTER — Other Ambulatory Visit: Payer: Self-pay | Admitting: Family Medicine

## 2013-09-02 DIAGNOSIS — R569 Unspecified convulsions: Secondary | ICD-10-CM

## 2013-09-03 ENCOUNTER — Encounter: Payer: BC Managed Care – PPO | Admitting: Internal Medicine

## 2013-09-07 ENCOUNTER — Encounter: Payer: Self-pay | Admitting: Neurology

## 2013-09-07 ENCOUNTER — Ambulatory Visit (INDEPENDENT_AMBULATORY_CARE_PROVIDER_SITE_OTHER): Payer: BC Managed Care – PPO | Admitting: Neurology

## 2013-09-07 VITALS — BP 148/88 | HR 71 | Resp 16 | Ht 64.5 in | Wt 175.0 lb

## 2013-09-07 DIAGNOSIS — R569 Unspecified convulsions: Secondary | ICD-10-CM

## 2013-09-07 MED ORDER — DIAZEPAM 5 MG PO TABS: ORAL_TABLET | ORAL | Status: DC

## 2013-09-07 MED ORDER — LEVETIRACETAM 500 MG PO TABS: 500.0000 mg | ORAL_TABLET | Freq: Two times a day (BID) | ORAL | Status: DC

## 2013-09-07 NOTE — Patient Instructions (Addendum)
1. MRI brain with and without contrast 2. 24-hour EEG 3. May take Valium 5mg  x 1 dose prior to MRI, may take second dose if needed 4. Continue Keppra 500mg  5. Check Keppra level 6. Follow-up in 3 months
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
## 2013-09-09 ENCOUNTER — Encounter: Payer: Self-pay | Admitting: Emergency Medicine

## 2013-09-09 ENCOUNTER — Other Ambulatory Visit: Payer: Self-pay | Admitting: Neurology

## 2013-09-09 ENCOUNTER — Ambulatory Visit (INDEPENDENT_AMBULATORY_CARE_PROVIDER_SITE_OTHER): Payer: BC Managed Care – PPO | Admitting: Emergency Medicine

## 2013-09-09 VITALS — BP 130/80 | HR 68 | Temp 98.0°F | Resp 16 | Ht 64.5 in | Wt 181.4 lb

## 2013-09-09 DIAGNOSIS — E871 Hypo-osmolality and hyponatremia: Secondary | ICD-10-CM

## 2013-09-09 DIAGNOSIS — R569 Unspecified convulsions: Secondary | ICD-10-CM

## 2013-09-09 LAB — CBC WITH DIFFERENTIAL/PLATELET
BASOS PCT: 0 % (ref 0–1)
Basophils Absolute: 0 10*3/uL (ref 0.0–0.1)
Eosinophils Absolute: 0.1 10*3/uL (ref 0.0–0.7)
Eosinophils Relative: 2 % (ref 0–5)
HEMATOCRIT: 34.5 % — AB (ref 36.0–46.0)
Hemoglobin: 12 g/dL (ref 12.0–15.0)
Lymphocytes Relative: 18 % (ref 12–46)
Lymphs Abs: 1.3 10*3/uL (ref 0.7–4.0)
MCH: 30 pg (ref 26.0–34.0)
MCHC: 34.8 g/dL (ref 30.0–36.0)
MCV: 86.3 fL (ref 78.0–100.0)
MONO ABS: 0.6 10*3/uL (ref 0.1–1.0)
Monocytes Relative: 9 % (ref 3–12)
NEUTROS ABS: 5.1 10*3/uL (ref 1.7–7.7)
Neutrophils Relative %: 71 % (ref 43–77)
PLATELETS: 331 10*3/uL (ref 150–400)
RBC: 4 MIL/uL (ref 3.87–5.11)
RDW: 12.9 % (ref 11.5–15.5)
WBC: 7.2 10*3/uL (ref 4.0–10.5)

## 2013-09-09 NOTE — Progress Notes (Signed)
Subjective:  This chart was scribed for Darlyne Russian, MD by Ladene Artist, ED Scribe. The patient was seen in room 22. Patient's care was started at 12:53 PM.   Patient ID: Jennifer [name], female    DOB: 06/30/1950, 63 y.o.   MRN: [id number]  Chief Complaint  Patient presents with  . Follow-up    hospital vs   HPI HPI Comments: Jennifer [name] is a 63 y.o. female, with a h/o hyperlipidemia, who presents to the [facility] for follow-up for hospital visit. Pt states that she is feeling well. She saw Dr. Delice Lesch yesterday who suggested that pt can return to work. Pt states that she does not know what caused seizure but she cut back on sodas prior to seizure. Pt states that it is difficult to accept help since she is very independent. She also reports difficulty with not being able to drive for 6 months. Pt denies fall, injury or bad car accident in the past.   Pt sees Dr. Rexene Alberts for sleep apnea.   Pt postponed colonoscopy which was scheduled for tomorrow, 09/10/13.   Pt has been walking her dog daily for exercise.   Past Medical History  Diagnosis Date  . Hyperlipidemia   . Osteoarthritis   . OSA (obstructive sleep apnea) 04/24/2012  . PLMD (periodic limb movement disorder) 04/24/2012  . Allergy   . Anxiety   . Sleep apnea     wears CPAP  . Sleep apnea   . Depression   . High cholesterol    Current Outpatient Prescriptions on File Prior to Visit  Medication Sig Dispense Refill  . cetirizine (ZYRTEC) 10 MG tablet Take 10 mg by mouth daily.      . citalopram (CELEXA) 40 MG tablet TAKE ONE TABLET BY MOUTH ONCE DAILY  30 tablet  5  . diazepam (VALIUM) 5 MG tablet Take 1 tablet by mouth prior to MRI, may repeat if needed.  2 tablet  0  . docusate sodium 100 MG CAPS Take 100 mg by mouth 2 (two) times daily as needed for mild constipation.    0  . levETIRAcetam (KEPPRA) 500 MG tablet Take 1 tablet (500 mg total) by mouth 2 (two) times daily.  60 tablet  6    . MELATONIN PO Take 5 mg by mouth daily.      . Multiple Vitamin (MULTIVITAMIN WITH MINERALS) TABS tablet Take 1 tablet by mouth daily.      . Multiple Vitamins-Minerals (MULTIVITAMIN PO) Take by mouth daily.      . pravastatin (PRAVACHOL) 40 MG tablet Take 80 mg by mouth daily.      . Na Sulfate-K Sulfate-Mg Sulf (SUPREP BOWEL PREP) SOLN Suprep as directed, no substitutions  354 mL  0   No current facility-administered medications on file prior to visit.   Allergies  Allergen Reactions  . Crestor [Rosuvastatin]   . Crestor [Rosuvastatin] Other (See Comments)    Muscle aches  . Neomycin-Bacitracin Zn-Polymyx Swelling    Ophthalmic only(eye swelling)  . Neomycin-Bacitracin-Polymyxin [Bacitracin-Neomycin-Polymyxin] Swelling    Reaction to eye drops  . Codeine Rash  . Codeine Rash  . Feldene [Piroxicam] Rash  . Penicillins Rash  . Penicillins Rash  . Piroxicam Rash  . Sulfa Antibiotics Rash  . Sulfonamide Derivatives Rash   Review of Systems     Objective:   Physical Exam CONSTITUTIONAL: Well developed/well nourished HEAD: Normocephalic/atraumatic EYES: EOMI/PERRL ENMT: Mucous membranes moist NECK: supple no meningeal signs  SPINE:entire spine nontender CV: S1/S2 noted, no murmurs/rubs/gallops noted LUNGS: Lungs are clear to auscultation bilaterally, no apparent distress ABDOMEN: soft, nontender, no rebound or guarding GU:no cva tenderness NEURO: Pt is awake/alert, moves all extremitiesx4 EXTREMITIES: pulses normal, full ROM SKIN: warm, color normal PSYCH: no abnormalities of mood noted    Assessment & Plan:  Basic metabolic panel drawn today. She will return to work next week if they are days. I held off on her flu shot and we'll give her that next visit. She is scheduled for testing with her neurologist I personally performed the services described in this documentation, which was scribed in my presence. The recorded information has been reviewed and is accurate.

## 2013-09-10 ENCOUNTER — Encounter: Payer: BC Managed Care – PPO | Admitting: Internal Medicine

## 2013-09-10 LAB — BASIC METABOLIC PANEL WITH GFR
BUN: 13 mg/dL (ref 6–23)
CO2: 24 mEq/L (ref 19–32)
Calcium: 9.2 mg/dL (ref 8.4–10.5)
Chloride: 98 mEq/L (ref 96–112)
Creat: 0.6 mg/dL (ref 0.50–1.10)
GFR, Est African American: >89 mL/min
GFR, Est Non African American: >89 mL/min
Glucose, Bld: 88 mg/dL (ref 70–99)
Potassium: 4.2 mEq/L (ref 3.5–5.3)
Sodium: 134 mEq/L — ABNORMAL LOW (ref 135–145)

## 2013-09-10 LAB — SEDIMENTATION RATE: Sed Rate: 13 mm/hr (ref 0–22)

## 2013-09-10 LAB — C-REACTIVE PROTEIN: CRP: <0.5 mg/dL (ref <0.60)

## 2013-09-11 LAB — LEVETIRACETAM LEVEL: Keppra (Levetiracetam): 7.8 ug/mL

## 2013-09-12 ENCOUNTER — Ambulatory Visit
Admission: RE | Admit: 2013-09-12 | Discharge: 2013-09-12 | Disposition: A | Discharge status: Home / Self Care | Payer: BC Managed Care – PPO | Source: Ambulatory Visit | Attending: Neurology | Admitting: Neurology

## 2013-09-12 ENCOUNTER — Other Ambulatory Visit: Payer: Self-pay | Admitting: Neurology

## 2013-09-12 MED ORDER — GADOBENATE DIMEGLUMINE 529 MG/ML IV SOLN: 16.0000 mL | Freq: Once | INTRAVENOUS | Status: AC | PRN

## 2013-09-14 ENCOUNTER — Encounter: Payer: Self-pay | Admitting: Neurology

## 2013-09-16 ENCOUNTER — Telehealth: Payer: Self-pay

## 2013-09-16 NOTE — Telephone Encounter (Signed)
Dr Everlene Farrier,  Jennifer [name] needs a note to allow her to take the CPR Refresher course after her allotted four hours of work, on Wednesday Sept 16th.  [facility].  [phone]

## 2013-09-17 ENCOUNTER — Telehealth: Payer: Self-pay | Admitting: Neurology

## 2013-09-17 NOTE — Telephone Encounter (Signed)
Letter printed and copy made for pt pick up.  Lm for pt to pick up.

## 2013-09-17 NOTE — Telephone Encounter (Signed)
Pls let her know I reviewed MRI brain with and without contrast, better quality, no evidence of new stroke, tumor, or inflammation. 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 Her keppra level is good, within range.  Thanks

## 2013-09-17 NOTE — Telephone Encounter (Signed)
Please print a note and send it to her so she can do her CPR training

## 2013-09-17 NOTE — Telephone Encounter (Signed)
Pt called f/u on the result for her MRI. Please call pt # [phone]

## 2013-09-17 NOTE — Telephone Encounter (Signed)
[phone] Patient requesting labs and MRI results

## 2013-09-17 NOTE — Telephone Encounter (Signed)
Pls advise.  

## 2013-09-17 NOTE — Telephone Encounter (Signed)
Patient was notified of results.  

## 2013-09-23 ENCOUNTER — Telehealth: Payer: Self-pay

## 2013-09-23 ENCOUNTER — Ambulatory Visit (INDEPENDENT_AMBULATORY_CARE_PROVIDER_SITE_OTHER): Payer: BC Managed Care – PPO | Admitting: Emergency Medicine

## 2013-09-23 ENCOUNTER — Telehealth: Payer: Self-pay | Admitting: Cardiology

## 2013-09-23 ENCOUNTER — Encounter: Payer: Self-pay | Admitting: Emergency Medicine

## 2013-09-23 VITALS — BP 124/70 | HR 64 | Resp 16 | Ht 64.5 in | Wt 181.0 lb

## 2013-09-23 DIAGNOSIS — F3289 Other specified depressive episodes: Secondary | ICD-10-CM

## 2013-09-23 DIAGNOSIS — F32A Depression, unspecified: Secondary | ICD-10-CM

## 2013-09-23 DIAGNOSIS — G4733 Obstructive sleep apnea (adult) (pediatric): Secondary | ICD-10-CM

## 2013-09-23 DIAGNOSIS — F329 Major depressive disorder, single episode, unspecified: Secondary | ICD-10-CM

## 2013-09-23 DIAGNOSIS — R569 Unspecified convulsions: Secondary | ICD-10-CM

## 2013-09-23 DIAGNOSIS — E871 Hypo-osmolality and hyponatremia: Secondary | ICD-10-CM

## 2013-09-23 LAB — CBC WITH DIFFERENTIAL/PLATELET
BASOS ABS: 0 10*3/uL (ref 0.0–0.1)
BASOS PCT: 0 % (ref 0–1)
Eosinophils Absolute: 0.1 10*3/uL (ref 0.0–0.7)
Eosinophils Relative: 1 % (ref 0–5)
HCT: 36.1 % (ref 36.0–46.0)
Hemoglobin: 12.2 g/dL (ref 12.0–15.0)
Lymphocytes Relative: 21 % (ref 12–46)
Lymphs Abs: 1.2 10*3/uL (ref 0.7–4.0)
MCH: 30.1 pg (ref 26.0–34.0)
MCHC: 33.8 g/dL (ref 30.0–36.0)
MCV: 89.1 fL (ref 78.0–100.0)
Monocytes Absolute: 0.4 10*3/uL (ref 0.1–1.0)
Monocytes Relative: 7 % (ref 3–12)
NEUTROS ABS: 4 10*3/uL (ref 1.7–7.7)
Neutrophils Relative %: 71 % (ref 43–77)
Platelets: 325 10*3/uL (ref 150–400)
RBC: 4.05 MIL/uL (ref 3.87–5.11)
RDW: 12.9 % (ref 11.5–15.5)
WBC: 5.6 10*3/uL (ref 4.0–10.5)

## 2013-09-23 LAB — BASIC METABOLIC PANEL
BUN: 12 mg/dL (ref 6–23)
CALCIUM: 9.2 mg/dL (ref 8.4–10.5)
CO2: 28 mEq/L (ref 19–32)
Chloride: 101 mEq/L (ref 96–112)
Creat: 0.68 mg/dL (ref 0.50–1.10)
GLUCOSE: 115 mg/dL — AB (ref 70–99)
Potassium: 4.5 mEq/L (ref 3.5–5.3)
Sodium: 136 mEq/L (ref 135–145)

## 2013-09-23 NOTE — Telephone Encounter (Signed)
Spoke with pt and notified her of her sodium levels that was drawn today

## 2013-09-23 NOTE — Telephone Encounter (Signed)
Dr. Everlene Farrier has called and is talking to Dr. Debara Pickett

## 2013-09-23 NOTE — Addendum Note (Signed)
Addended byCandice Camp on: 09/23/2013 01:48 PM   Modules accepted: Orders

## 2013-09-23 NOTE — Progress Notes (Addendum)
Subjective:  This chart was scribed for Jennifer Queen, MD by Jennifer [name], Medical Scribe. This patient was seen in Room 23 and the patient's care was started at 11:36 AM.   Patient ID: Jennifer [name], female    DOB: [birth year], 63 y.o.   MRN: [id number]  HPI HPI Comments: Jennifer [name] is a 63 y.o. female who presents to the [facility] for a follow-up appointment regarding a seizure she had two weeks ago.  She has been complaining of intermittent twitching in her left eyeball that she attributes to stress due to work.  She denies seeing double.  She uses her CPAP every night.     Past Medical History  Diagnosis Date  . Hyperlipidemia   . Osteoarthritis   . OSA (obstructive sleep apnea) 04/24/2012  . PLMD (periodic limb movement disorder) 04/24/2012  . Allergy   . Anxiety   . Sleep apnea     wears CPAP  . Sleep apnea   . Depression   . High cholesterol    Past Surgical History  Procedure Laterality Date  . Joint replacement  1998    right knee  . Cholecystectomy  1978  . Cesarean section    . Osteotomy proximal femoral    . Replacement total knee      Right   Family History  Problem Relation Age of Onset  . Cancer Father 78  . Esophageal cancer Father   . Heart disease Mother 66    Aortic valve disease  . Atrial fibrillation Brother   . Sleep apnea Brother   . Sleep apnea Sister   . Sleep apnea Sister   . Stomach cancer Maternal Grandmother   . Colon cancer Neg Hx   . Rectal cancer Neg Hx    History   Social History  . Marital Status: Divorced    Spouse Name: N/A    Number of Children: 1  . Years of Education: N/A   Occupational History  . [employer]    Social History Main Topics  . Smoking status: Never Smoker   . Smokeless tobacco: Never Used  . Alcohol Use: No  . Drug Use: No  . Sexual Activity: No   Other Topics Concern  . Not on file   Social History Narrative   ** Merged History Encounter **       Lives with cats.     Allergies  Allergen Reactions  . Crestor [Rosuvastatin]   . Crestor [Rosuvastatin] Other (See Comments)    Muscle aches  . Neomycin-Bacitracin Zn-Polymyx Swelling    Ophthalmic only(eye swelling)  . Neomycin-Bacitracin-Polymyxin [Bacitracin-Neomycin-Polymyxin] Swelling    Reaction to eye drops  . Codeine Rash  . Codeine Rash  . Feldene [Piroxicam] Rash  . Penicillins Rash  . Penicillins Rash  . Piroxicam Rash  . Sulfa Antibiotics Rash  . Sulfonamide Derivatives Rash    Review of Systems  Eyes: Negative for visual disturbance.  Psychiatric/Behavioral: Negative for suicidal ideas.     Objective:  Physical Exam  Nursing note and vitals reviewed. Constitutional: She is oriented to person, place, and time. She appears well-developed and well-nourished.  HENT:  Head: Normocephalic and atraumatic.  Right Ear: External ear normal.  Left Ear: External ear normal.  Nose: Nose normal.  Mouth/Throat: Oropharynx is clear and moist. No oropharyngeal exudate.  Eyes: EOM are normal. Pupils are equal, round, and reactive to light.  Neck: Normal range of motion.  Cardiovascular: Normal rate, regular rhythm  and normal heart sounds.   No murmur heard. Pulmonary/Chest: Effort normal and breath sounds normal. No respiratory distress. She has no wheezes. She has no rales.  Musculoskeletal: Normal range of motion.  Neurological: She is alert and oriented to person, place, and time. She has normal reflexes.  Skin: Skin is warm and dry.  Psychiatric: She has a normal mood and affect. Her behavior is normal.   EKG shows a borderline short PR interval of 0.18 QTC is normal at 412 QT 404.  BP 124/70  Pulse 64  Resp 16  Ht 5' 4.5" (1.638 m)  Wt 181 lb (82.101 kg)  BMI 30.60 kg/m2  SpO2 97% Assessment & Plan:  I personally performed the services described in this documentation, which was scribed in my presence. The recorded information has been reviewed and is accurate.   Patient still recovering from recent seizure.  She has had some twitching around her left eye.  Will take out of work at the present time and reevaluate in 10-12 days.  I feel the stress at work is detrimental to her health and predisposing her to have another seizure.  She will continue her followups with Jennifer [name]. I will see her in about 10 days and decide on the next step at that time. I did place a call to her son Jennifer [name]. Will hold citalopram.. . She did have a low sodium while in the hospital but last sodium was 134 we'll recheck this again today.Stop pravachol for now. QT in hospital borderline prolonged. Celexa stopped. I discussed this issue with Jennifer [name]. He feels this QT interval is acceptable as long as the patient does not take any other drugs that would potentiate QT prolongation

## 2013-09-23 NOTE — Addendum Note (Signed)
Addended byCandice Camp on: 09/23/2013 05:07 PM   Modules accepted: Orders

## 2013-09-23 NOTE — Telephone Encounter (Signed)
Dr Everlene Farrier wants you to call him about this pt.

## 2013-09-27 ENCOUNTER — Ambulatory Visit: Payer: BC Managed Care – PPO | Admitting: Neurology

## 2013-09-28 ENCOUNTER — Encounter: Payer: Self-pay | Admitting: Emergency Medicine

## 2013-10-04 ENCOUNTER — Encounter: Payer: Self-pay | Admitting: Emergency Medicine

## 2013-10-04 ENCOUNTER — Ambulatory Visit (INDEPENDENT_AMBULATORY_CARE_PROVIDER_SITE_OTHER): Payer: BC Managed Care – PPO | Admitting: Emergency Medicine

## 2013-10-04 VITALS — BP 122/73 | HR 68 | Temp 98.2°F | Resp 16 | Ht 64.0 in | Wt 184.8 lb

## 2013-10-04 DIAGNOSIS — Z733 Stress, not elsewhere classified: Secondary | ICD-10-CM

## 2013-10-04 DIAGNOSIS — F439 Reaction to severe stress, unspecified: Secondary | ICD-10-CM

## 2013-10-04 DIAGNOSIS — R561 Post traumatic seizures: Secondary | ICD-10-CM

## 2013-10-04 NOTE — Progress Notes (Signed)
   Subjective:    Patient ID: Jennifer [name], female    DOB: 02-Sep-1950, 63 y.o.   MRN: [id number]  HPI  Patient here for follow up.  Patient is feeling better.   Feels like the break did her a lot of good.  She did some reflection.  Taking 1/2 of celexa, going to stop taking melatonin.  Feels like she is not having side effects from dropping dose of celexa.  Eye is still twitching from time to time.      Review of Systems     Objective:   Physical Exam  Constitutional: She is oriented to person, place, and time. She appears well-developed and well-nourished.  HENT:  Head: Normocephalic.  Eyes: Pupils are equal, round, and reactive to light.  Neck: No tracheal deviation present. No thyromegaly present.  Cardiovascular: Normal rate, regular rhythm and normal heart sounds.  Exam reveals no gallop and no friction rub.   No murmur heard. Pulmonary/Chest: Effort normal and breath sounds normal. She has no wheezes.  Abdominal: Soft. There is no tenderness. There is no rebound.  Neurological: She is alert and oriented to person, place, and time. She has normal reflexes. No cranial nerve deficit. Coordination normal.  Psychiatric: She has a normal mood and affect. Her behavior is normal. Judgment and thought content normal.          Assessment & Plan:  RTW 4 hour days in one week.Recheck one month

## 2013-10-05 ENCOUNTER — Telehealth: Payer: Self-pay | Admitting: Neurology

## 2013-10-05 NOTE — Telephone Encounter (Signed)
Agree with reduced workload. Thanks

## 2013-10-05 NOTE — Telephone Encounter (Signed)
Patient called she wanted to give you an update. She tried going back to work 1/2 days & started having twitching in her eyes, but no other sxs so Dr. Everlene Farrier took her out of work. His notes are in epic for your review. She states she has been doing well overall & still doing good on the Keppra. She is scheduled for 24 hr EEG on 10/26. She states that she is going to try working 1/2 days again this upcoming Monday. I told her I would call her back if you had any other advisement.

## 2013-10-05 NOTE — Telephone Encounter (Signed)
Pt called requesting to speak to a nurse regarding her current status. C/B [phone]

## 2013-10-07 ENCOUNTER — Telehealth: Payer: Self-pay | Admitting: Emergency Medicine

## 2013-10-07 NOTE — Telephone Encounter (Signed)
Attending Physician's Statement placed in Dr. Perfecto Kingdom box on 10/07/2013. Please return to disabilities department when completed.

## 2013-10-11 NOTE — Telephone Encounter (Signed)
Form completed, is being sent back to you inter office mail. Please send form with records.

## 2013-10-13 ENCOUNTER — Telehealth: Payer: Self-pay | Admitting: Neurology

## 2013-10-13 NOTE — Telephone Encounter (Signed)
Form faxed

## 2013-10-13 NOTE — Telephone Encounter (Signed)
Pt called/returning your call at 12:54PM  C/B [phone]

## 2013-10-13 NOTE — Telephone Encounter (Signed)
Returned call. Needed to clarify out of work dates for disability form.

## 2013-10-25 ENCOUNTER — Other Ambulatory Visit: Payer: Self-pay | Admitting: Emergency Medicine

## 2013-11-01 ENCOUNTER — Ambulatory Visit (INDEPENDENT_AMBULATORY_CARE_PROVIDER_SITE_OTHER): Payer: BC Managed Care – PPO | Admitting: Neurology

## 2013-11-01 DIAGNOSIS — R569 Unspecified convulsions: Secondary | ICD-10-CM

## 2013-11-02 ENCOUNTER — Telehealth: Payer: Self-pay | Admitting: Family Medicine

## 2013-11-02 NOTE — Telephone Encounter (Signed)
Called patient. Left msg on voicemail that Disability form was faxed to [facility]. Asked to call back if any further questions.

## 2013-11-04 ENCOUNTER — Ambulatory Visit: Payer: BC Managed Care – PPO | Admitting: Emergency Medicine
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
## 2013-11-08 ENCOUNTER — Encounter: Payer: Self-pay | Admitting: Emergency Medicine

## 2013-11-08 ENCOUNTER — Ambulatory Visit (INDEPENDENT_AMBULATORY_CARE_PROVIDER_SITE_OTHER): Payer: BC Managed Care – PPO | Admitting: Emergency Medicine

## 2013-11-08 VITALS — BP 135/70 | HR 77 | Temp 97.8°F | Resp 16 | Ht 64.5 in | Wt 192.0 lb

## 2013-11-08 DIAGNOSIS — R561 Post traumatic seizures: Secondary | ICD-10-CM

## 2013-11-08 DIAGNOSIS — G4733 Obstructive sleep apnea (adult) (pediatric): Secondary | ICD-10-CM

## 2013-11-08 DIAGNOSIS — F32A Depression, unspecified: Secondary | ICD-10-CM

## 2013-11-08 DIAGNOSIS — F329 Major depressive disorder, single episode, unspecified: Secondary | ICD-10-CM

## 2013-11-08 NOTE — Progress Notes (Signed)
   Subjective:    Patient ID: Jennifer [name], female    DOB: Jun 15, 1950, 63 y.o.   MRN: [id number]  HPIpatient in for follow-up of her seizure. She feels great and has had no further seizure activity. She is not able to drive until April. She has had a outpatient EEG which was read as normal. She continues on her Keppra.    Review of Systems     Objective:   Physical Exam  Constitutional: She is oriented to person, place, and time. She appears well-developed and well-nourished.  Neck: Normal range of motion.  Cardiovascular: Normal rate and regular rhythm.   Pulmonary/Chest: Effort normal and breath sounds normal.  Neurological: She is alert and oriented to person, place, and time. No cranial nerve deficit. Coordination normal.          Assessment & Plan:  Jennifer [name] looks great. Her flu shot has are to been given. She will continue to work a 25 week.

## 2013-12-08 ENCOUNTER — Ambulatory Visit (INDEPENDENT_AMBULATORY_CARE_PROVIDER_SITE_OTHER): Payer: BC Managed Care – PPO | Admitting: Neurology

## 2013-12-08 ENCOUNTER — Encounter: Payer: Self-pay | Admitting: Neurology

## 2013-12-08 VITALS — BP 130/88 | HR 73 | Resp 16 | Ht 64.5 in | Wt 200.0 lb

## 2013-12-08 DIAGNOSIS — G40009 Localization-related (focal) (partial) idiopathic epilepsy and epileptic syndromes with seizures of localized onset, not intractable, without status epilepticus: Secondary | ICD-10-CM
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
## 2013-12-09 ENCOUNTER — Other Ambulatory Visit (HOSPITAL_COMMUNITY): Payer: Self-pay | Admitting: Emergency Medicine

## 2013-12-09 ENCOUNTER — Encounter: Payer: Self-pay | Admitting: Neurology

## 2013-12-09 ENCOUNTER — Ambulatory Visit (INDEPENDENT_AMBULATORY_CARE_PROVIDER_SITE_OTHER): Payer: BC Managed Care – PPO | Admitting: Emergency Medicine

## 2013-12-09 ENCOUNTER — Encounter: Payer: Self-pay | Admitting: Emergency Medicine

## 2013-12-09 ENCOUNTER — Ambulatory Visit (HOSPITAL_COMMUNITY)
Admission: RE | Admit: 2013-12-09 | Discharge: 2013-12-09 | Disposition: A | Discharge status: Home / Self Care | Payer: BC Managed Care – PPO | Source: Ambulatory Visit | Attending: Emergency Medicine | Admitting: Emergency Medicine

## 2013-12-09 VITALS — BP 151/82 | HR 71 | Temp 97.8°F | Resp 16 | Ht 64.5 in | Wt 198.2 lb

## 2013-12-09 DIAGNOSIS — R609 Edema, unspecified: Secondary | ICD-10-CM

## 2013-12-09 DIAGNOSIS — M7989 Other specified soft tissue disorders: Secondary | ICD-10-CM

## 2013-12-09 DIAGNOSIS — F329 Major depressive disorder, single episode, unspecified: Secondary | ICD-10-CM

## 2013-12-09 DIAGNOSIS — I10 Essential (primary) hypertension: Secondary | ICD-10-CM

## 2013-12-09 DIAGNOSIS — R561 Post traumatic seizures: Secondary | ICD-10-CM

## 2013-12-09 DIAGNOSIS — F32A Depression, unspecified: Secondary | ICD-10-CM
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
## 2013-12-09 NOTE — Progress Notes (Signed)
*  PRELIMINARY RESULTS* Vascular Ultrasound Left lower extremity venous duplex has been completed.  Preliminary findings: No evidence of DVT. Baker's cyst noted.  Called results to Dr. Everlene Farrier.    Landry Mellow, RDMS, RVT  12/09/2013, 2:50 PM

## 2014-01-13 ENCOUNTER — Encounter: Payer: Self-pay | Admitting: Internal Medicine

## 2014-01-25 ENCOUNTER — Ambulatory Visit (INDEPENDENT_AMBULATORY_CARE_PROVIDER_SITE_OTHER): Payer: BLUE CROSS/BLUE SHIELD | Admitting: Emergency Medicine

## 2014-01-25 ENCOUNTER — Encounter: Payer: Self-pay | Admitting: Emergency Medicine

## 2014-01-25 VITALS — BP 138/78 | HR 83 | Temp 97.8°F | Resp 16 | Ht 64.0 in | Wt 200.0 lb

## 2014-01-25 DIAGNOSIS — Z658 Other specified problems related to psychosocial circumstances: Secondary | ICD-10-CM

## 2014-01-25 DIAGNOSIS — I10 Essential (primary) hypertension: Secondary | ICD-10-CM

## 2014-01-25 DIAGNOSIS — F32A Depression, unspecified: Secondary | ICD-10-CM

## 2014-01-25 DIAGNOSIS — F439 Reaction to severe stress, unspecified: Secondary | ICD-10-CM

## 2014-01-25 DIAGNOSIS — R561 Post traumatic seizures: Secondary | ICD-10-CM

## 2014-01-25 DIAGNOSIS — F329 Major depressive disorder, single episode, unspecified: Secondary | ICD-10-CM

## 2014-01-25 NOTE — Progress Notes (Signed)
Subjective:  This chart was scribed for Jennifer Queen, MD by Donato Schultz, Medical Scribe. This patient was seen in Room 22 and the patient's care was started at 4:03 PM.   Patient ID: Jennifer [name], female    DOB: 04/09/1950, 64 y.o.   MRN: [id number]  HPI HPI Comments: Jennifer [name] is a 63 y.o. female who presents to the [facility] for a follow-up appointment.  She has no complaints at this time and her ankle is no longer swollen.  She has a sleep study scheduled next week and she has an appointment to see her neurologist in March.  She has 7 more weeks left before she can resume driving and she would like to return to work once her 7 weeks are up.  She has started walking regularly and has started [employer].     Patient Active Problem List   Diagnosis Date Noted  . 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 OSA (obstructive sleep apnea) 04/24/2012  . PLMD (periodic limb movement disorder) 04/24/2012  . Fibroids, submucosal 12/18/2010  . HYPERLIPIDEMIA 07/04/2008  . ESSENTIAL HYPERTENSION, BENIGN 07/04/2008  . BAKER'S CYST, LEFT KNEE 07/04/2008  . ELECTROCARDIOGRAM, ABNORMAL 07/04/2008   Past Medical History  Diagnosis Date  . Hyperlipidemia   . Osteoarthritis   . OSA (obstructive sleep apnea) 04/24/2012  . PLMD (periodic limb movement disorder) 04/24/2012  . Allergy   . Anxiety   . Sleep apnea     wears CPAP  . Sleep apnea   . Depression   . High cholesterol    Past Surgical History  Procedure Laterality Date  . Joint replacement  1998    right knee  . Cholecystectomy  1978  . Cesarean section    . Osteotomy proximal femoral    . Replacement total knee      Right   Allergies  Allergen Reactions  . Crestor [Rosuvastatin]   . Crestor [Rosuvastatin] Other (See Comments)    Muscle aches  . Neomycin-Bacitracin Zn-Polymyx Swelling    Ophthalmic only(eye swelling)  . Neomycin-Bacitracin-Polymyxin [Bacitracin-Neomycin-Polymyxin]  Swelling    Reaction to eye drops  . Codeine Rash  . Codeine Rash  . Feldene [Piroxicam] Rash  . Penicillins Rash  . Penicillins Rash  . Piroxicam Rash  . Sulfa Antibiotics Rash  . Sulfonamide Derivatives Rash   Prior to Admission medications   Medication Sig Start Date End Date Taking? Authorizing Provider  aspirin EC 81 MG tablet Take 81 mg by mouth daily.   Yes Historical Provider, MD  b complex vitamins tablet Take 1 tablet by mouth daily.   Yes Historical Provider, MD  cetirizine (ZYRTEC) 10 MG tablet Take 10 mg by mouth daily.   Yes Historical Provider, MD  citalopram (CELEXA) 40 MG tablet TAKE ONE TABLET BY MOUTH ONCE DAILY Patient taking differently: one half tablet daily   Yes Darlyne Russian, MD  levETIRAcetam (KEPPRA) 500 MG tablet Take 1 tablet (500 mg total) by mouth 2 (two) times daily. 09/07/13  Yes Cameron Sprang, MD  MELATONIN PO Take 5 mg by mouth daily.   Yes Historical Provider, MD  Multiple Vitamin (MULTIVITAMIN WITH MINERALS) TABS tablet Take 1 tablet by mouth daily.   Yes Historical Provider, MD  pravastatin (PRAVACHOL) 40 MG tablet TAKE TWO TABLETS BY MOUTH ONCE DAILY Patient taking differently: take one tablet daily 10/26/13  Yes Darlyne Russian, MD   History   Social History  . Marital Status: Divorced  Spouse Name: N/A    Number of Children: 1  . Years of Education: N/A   Occupational History  . [employer]    Social History Main Topics  . Smoking status: Never Smoker   . Smokeless tobacco: Never Used  . Alcohol Use: No  . Drug Use: No  . Sexual Activity: No   Other Topics Concern  . Not on file   Social History Narrative   ** Merged History Encounter **       Lives with cats.       Review of Systems    Objective:  Physical Exam  Constitutional: She is oriented to person, place, and time. She appears well-developed and well-nourished.  HENT:  Head: Normocephalic and atraumatic.  Right Ear: External ear normal.  Left Ear:  External ear normal.  Eyes: EOM are normal.  Neck: Normal range of motion.  Cardiovascular: Normal rate, regular rhythm and normal heart sounds.  Exam reveals no gallop and no friction rub.   No murmur heard. Pulmonary/Chest: Effort normal and breath sounds normal. No respiratory distress. She has no wheezes. She has no rales.  Musculoskeletal: Normal range of motion.  Neurological: She is alert and oriented to person, place, and time.  Skin: Skin is warm and dry.  Psychiatric: She has a normal mood and affect. Her behavior is normal.  Nursing note and vitals reviewed.  BP in exam room - left arm 134/78  BP 138/78 mmHg  Pulse 83  Temp(Src) 97.8 F (36.6 C) (Oral)  Resp 16  Ht 5\' 4"  (1.626 m)  Wt 200 lb (90.719 kg)  BMI 34.31 kg/m2  SpO2 96% Assessment & Plan:  Patient doing well post seizure.  She is actively treating her OSA.  Blood pressure is better today.  She is working full time and doing well.  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 I personally performed the services described in this documentation, which was scribed in my presence. The recorded information has been reviewed and is accurate.

## 2014-01-26 ENCOUNTER — Other Ambulatory Visit: Payer: Self-pay | Admitting: Emergency Medicine

## 2014-01-27 ENCOUNTER — Telehealth: Payer: Self-pay | Admitting: Neurology

## 2014-01-27 ENCOUNTER — Encounter: Payer: Self-pay | Admitting: Neurology

## 2014-01-27 NOTE — Telephone Encounter (Signed)
Dr Everlene Farrier, you just saw pt but haven't discussed/tested lipids since June. OK to give RFs?

## 2014-01-27 NOTE — Telephone Encounter (Signed)
Called and requested a recent download from [facility], and they will fax over the most recent

## 2014-01-28 ENCOUNTER — Ambulatory Visit: Payer: BC Managed Care – PPO | Admitting: Neurology

## 2014-01-31 ENCOUNTER — Encounter: Payer: Self-pay | Admitting: Neurology

## 2014-01-31 ENCOUNTER — Telehealth: Payer: Self-pay | Admitting: Neurology

## 2014-01-31 ENCOUNTER — Ambulatory Visit (INDEPENDENT_AMBULATORY_CARE_PROVIDER_SITE_OTHER): Payer: BLUE CROSS/BLUE SHIELD | Admitting: Neurology

## 2014-01-31 VITALS — BP 126/64 | HR 73 | Temp 98.5°F | Wt 203.0 lb

## 2014-01-31 DIAGNOSIS — G4733 Obstructive sleep apnea (adult) (pediatric): Secondary | ICD-10-CM

## 2014-01-31 DIAGNOSIS — Z9989 Dependence on other enabling machines and devices: Principal | ICD-10-CM

## 2014-01-31 DIAGNOSIS — G40009 Localization-related (focal) (partial) idiopathic epilepsy and epileptic syndromes with seizures of localized onset, not intractable, without status epilepticus: Secondary | ICD-10-CM
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
## 2014-01-31 NOTE — Telephone Encounter (Signed)
Called and requested from [facility](April) a recent download for patient, said that they will fax report to our office. Called patient and left VM message for her to bring machine in for today's visit.

## 2014-01-31 NOTE — Patient Instructions (Signed)
Please continue using your CPAP regularly. While your insurance requires that you use CPAP at least 4 hours each night on 70% of the nights, I recommend, that you not skip any nights and use it throughout the night if you can. Getting used to CPAP and staying with the treatment long term does take time and patience and discipline. Untreated obstructive sleep apnea when it is moderate to severe can have an adverse impact on cardiovascular health and raise her risk for heart disease, arrhythmias, hypertension, congestive heart failure, stroke and diabetes. Untreated obstructive sleep apnea causes sleep disruption, nonrestorative sleep, and sleep deprivation. This can have an impact on your day to day functioning and cause daytime sleepiness and impairment of cognitive function, memory loss, mood disturbance, and problems focussing. Using CPAP regularly can improve these symptoms.  Please do not skip any days. You have been diagnosed with partial complex seizures. Please take your medication always on time, do not skip.    Please remember, common seizure triggers are: Sleep deprivation, dehydration, overheating, stress, hypoglycemia or skipping meals, certain medications or excessive alcohol use, especially stopping alcohol abruptly if you have had heavy alcohol use before (aka alcohol withdrawal seizure). If you have a prolonged seizure over 2-5 minutes or back to back seizures, call or have someone call 911 or take you to the nearest emergency room. You cannot drive a car or operate any other machinery or vehicle within 6 months of a seizure. Please do not swim alone or take a tub bath for safety. Do not cook with large quantities of boiling water or oil for safety. Take your medicine for seizure prevention regularly and do not skip doses or stop medication abruptly and tone are told to do so by your healthcare provider. Try to get a refill on your antiepileptic medication ahead of time, so you are not at risk  of running out. If you run out of the seizure medication and do not have a refill at hand she may run into medication withdrawal seizures. Avoid taking Wellbutrin, narcotic pain medications and tramadol, as they can lower seizure threshold.   Follow up with Dr. Delice Lesch.

## 2014-02-15 ENCOUNTER — Telehealth: Payer: Self-pay | Admitting: Neurology

## 2014-02-15 NOTE — Telephone Encounter (Signed)
Pt needs to talk to [place] about the [facility] paper work and one other thing please call her at [phone] pt left message on the voice mail

## 2014-02-15 NOTE — Telephone Encounter (Signed)
(  1) She wanted to let you  know that she is going to fax over paperwork from [facility] for her driving restrictions. (2) Her pcp wanted her to ask you if she would meet criteria for being taken out of work for seizure activity, she is questioning this for her 2nd job which she hasn't been back to since she had the seizure. She is aware you are out of the office & that she won't get an answer until next week.

## 2014-02-23 NOTE — Telephone Encounter (Signed)
Pls let her know that since she had the one seizure, it would be hard to justify being taken out of work due to seizure. Thanks

## 2014-02-24 NOTE — Telephone Encounter (Signed)
Lmovm to return my call. 

## 2014-02-28 NOTE — Telephone Encounter (Signed)
Tried calling patient again, was able to speak with her. I did notify her Dr. Amparo Bristol response.

## 2014-03-09 ENCOUNTER — Encounter: Payer: Self-pay | Admitting: Neurology

## 2014-03-09 ENCOUNTER — Ambulatory Visit (INDEPENDENT_AMBULATORY_CARE_PROVIDER_SITE_OTHER): Payer: BLUE CROSS/BLUE SHIELD | Admitting: Neurology

## 2014-03-09 VITALS — BP 128/84 | HR 69 | Resp 16 | Ht 64.5 in | Wt 201.0 lb

## 2014-03-09 DIAGNOSIS — R569 Unspecified convulsions: Secondary | ICD-10-CM

## 2014-03-09 DIAGNOSIS — G4733 Obstructive sleep apnea (adult) (pediatric): Secondary | ICD-10-CM

## 2014-03-09 DIAGNOSIS — Z9989 Dependence on other enabling machines and devices: Secondary | ICD-10-CM

## 2014-03-09 DIAGNOSIS — G40009 Localization-related (focal) (partial) idiopathic epilepsy and epileptic syndromes with seizures of localized onset, not intractable, without status epilepticus: Secondary | ICD-10-CM

## 2014-03-09 MED ORDER — LEVETIRACETAM 500 MG PO TABS: 500.0000 mg | ORAL_TABLET | Freq: Two times a day (BID) | ORAL | Status: DC
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
## 2014-03-15 ENCOUNTER — Encounter: Payer: Self-pay | Admitting: Neurology

## 2014-03-15 ENCOUNTER — Ambulatory Visit (AMBULATORY_SURGERY_CENTER): Payer: Self-pay

## 2014-03-15 VITALS — Ht 64.0 in | Wt 201.0 lb

## 2014-03-15 DIAGNOSIS — Z860100 Personal history of colon polyps, unspecified: Secondary | ICD-10-CM

## 2014-03-15 NOTE — Progress Notes (Signed)
No allergies to eggs or soy No diet/weight loss meds No home oxygen No past problems with anesthesia (except had something mixed with propofol and had difficulty waking up)  Has email  Emmi instructions given for colonoscopy

## 2014-03-29 ENCOUNTER — Ambulatory Visit (AMBULATORY_SURGERY_CENTER): Payer: Self-pay | Admitting: Internal Medicine

## 2014-03-29 ENCOUNTER — Encounter: Payer: Self-pay | Admitting: Internal Medicine

## 2014-03-29 VITALS — BP 128/88 | HR 59 | Temp 97.7°F | Resp 16 | Ht 64.0 in | Wt 201.0 lb

## 2014-03-29 DIAGNOSIS — Z860101 Personal history of adenomatous and serrated colon polyps: Secondary | ICD-10-CM | POA: Insufficient documentation

## 2014-03-29 DIAGNOSIS — Z86010 Personal history of colonic polyps: Secondary | ICD-10-CM

## 2014-03-29 MED ORDER — SODIUM CHLORIDE 0.9 % IV SOLN: 500.0000 mL | INTRAVENOUS | Status: DC

## 2014-03-29 NOTE — Patient Instructions (Addendum)
Unfortunately the prep did not work well so I could not complete the exam. This was reported on your other colonoscopies as well.  YOU HAD AN ENDOSCOPIC PROCEDURE TODAY AT [facility]:   Refer to the procedure report that was given to you for any specific questions about what was found during the examination.  If the procedure report does not answer your questions, please call your gastroenterologist to clarify.  If you requested that your care partner not be given the details of your procedure findings, then the procedure report has been included in a sealed envelope for you to review at your convenience later.  YOU SHOULD EXPECT: Some feelings of bloating in the abdomen. Passage of more gas than usual.  Walking can help get rid of the air that was put into your GI tract during the procedure and reduce the bloating. If you had a lower endoscopy (such as a colonoscopy or flexible sigmoidoscopy) you may notice spotting of blood in your stool or on the toilet paper. If you underwent a bowel prep for your procedure, you may not have a normal bowel movement for a few days.  Please Note:  You might notice some irritation and congestion in your nose or some drainage.  This is from the oxygen used during your procedure.  There is no need for concern and it should clear up in a day or so.  SYMPTOMS TO REPORT IMMEDIATELY:   Following lower endoscopy (colonoscopy or flexible sigmoidoscopy):  Excessive amounts of blood in the stool  Significant tenderness or worsening of abdominal pains  Swelling of the abdomen that is new, acute  Fever of 100F or higher  For urgent or emergent issues, a gastroenterologist can be reached at any hour by calling [phone].   DIET: Your first meal following the procedure should be a small meal and then it is ok to progress to your normal diet. Heavy or fried foods are harder to digest and may make you feel nauseous or bloated.  Likewise, meals  heavy in dairy and vegetables can increase bloating.  Drink plenty of fluids but you should avoid alcoholic beverages for 24 hours.  ACTIVITY:  You should plan to take it easy for the rest of today and you should NOT DRIVE or use heavy machinery until tomorrow (because of the sedation medicines used during the test).    FOLLOW UP: Our staff will call the number listed on your records the next business day following your procedure to check on you and address any questions or concerns that you may have regarding the information given to you following your procedure. If we do not reach you, we will leave a message.  However, if you are feeling well and you are not experiencing any problems, there is no need to return our call.  We will assume that you have returned to your regular daily activities without incident.  If any biopsies were taken you will be contacted by phone or by letter within the next 1-3 weeks.  Please call us at [phone] if you have not heard about the biopsies in 3 weeks.    SIGNATURES/CONFIDENTIALITY: You and/or your care partner have signed paperwork which will be entered into your electronic medical record.  These signatures attest to the fact that that the information above on your After Visit Summary has been reviewed and is understood.  Full responsibility of the confidentiality of this discharge information lies with you and/or your care-partner.

## 2014-03-29 NOTE — Op Note (Signed)
[place]  [address] [place], [address]   COLONOSCOPY PROCEDURE REPORT  PATIENT: Jennifer, [name]  MR#: [id number] BIRTHDATE: 30-Nov-1950 , 65  yrs. old GENDER: female ENDOSCOPIST: Gatha Mayer, MD, [facility] PROCEDURE DATE:  03/29/2014 PROCEDURE:   Colonoscopy, surveillance - incomplete First Screening Colonoscopy - Avg.  risk and is 50 yrs.  old or older - No.  Prior Negative Screening - Now for repeat screening. N/A  History of Adenoma - Now for follow-up colonoscopy & has been > or = to 3 yrs.  Yes hx of adenoma.  Has been 3 or more years since last colonoscopy. ASA CLASS:   Class II INDICATIONS:Surveillance due to prior colonic neoplasia and PH Colon Adenoma. MEDICATIONS: Propofol 200 mg IV and Monitored anesthesia care  DESCRIPTION OF PROCEDURE:   After the risks benefits and alternatives of the procedure were thoroughly explained, informed consent was obtained.  The digital rectal exam revealed no abnormalities of the rectum.   The LB [id number]  endoscope was introduced through the anus and advanced to the descending colon. No adverse events experienced.   Limited by poor preparation.   The quality of the prep was (MiraLax was used) poor. suboptimal  The instrument was then slowly withdrawn as the colon was fully examined.      COLON FINDINGS: Limited views of rectum, sigmoid and descending colon normal.  Solid stool pieces in proximal sigmoid and descending colon.  Retroflexed views revealed no abnormalities. The time to cecum =    Withdrawal time =      The scope was withdrawn and the procedure completed. COMPLICATIONS: There were no immediate complications.  ENDOSCOPIC IMPRESSION: Limited views of rectum, sigmoid and descending colon normal.  Solid stool pieces in proximal sigmoid and descending colon  RECOMMENDATIONS: Need to reschedule with double prep please.  eSigned:  Gatha Mayer, MD, [facility] 03/29/2014 11:17 AM   cc:  [name], MD and The Patient

## 2014-03-29 NOTE — Progress Notes (Signed)
Patient awakening,vss,report to rn 

## 2014-03-30 ENCOUNTER — Telehealth: Payer: Self-pay

## 2014-03-30 NOTE — Telephone Encounter (Signed)
No answer, left message

## 2014-04-27 ENCOUNTER — Ambulatory Visit (AMBULATORY_SURGERY_CENTER): Payer: Self-pay | Admitting: *Deleted

## 2014-04-27 VITALS — Ht 64.0 in | Wt 199.8 lb

## 2014-04-27 DIAGNOSIS — Z86010 Personal history of colonic polyps: Secondary | ICD-10-CM

## 2014-04-27 MED ORDER — MOVIPREP 100 G PO SOLR: 1.0000 | Freq: Once | ORAL | Status: DC

## 2014-04-27 NOTE — Progress Notes (Signed)
Denies allergies to eggs or soy products. Denies complications with sedation or anesthesia. Denies O2 use. Denies use of diet or weight loss medications.  Emmi instructions given for colonoscopy, not given this time as this is repeat colonoscopy from March.

## 2014-05-06 ENCOUNTER — Encounter: Payer: Self-pay | Admitting: Internal Medicine

## 2014-05-09 ENCOUNTER — Encounter: Payer: Self-pay | Admitting: Internal Medicine

## 2014-05-09 ENCOUNTER — Ambulatory Visit (AMBULATORY_SURGERY_CENTER): Payer: BLUE CROSS/BLUE SHIELD | Admitting: Internal Medicine

## 2014-05-09 VITALS — BP 127/64 | HR 71 | Temp 98.7°F | Resp 18 | Ht 64.5 in | Wt 210.0 lb

## 2014-05-09 DIAGNOSIS — Z86010 Personal history of colonic polyps: Secondary | ICD-10-CM

## 2014-05-09 MED ORDER — SODIUM CHLORIDE 0.9 % IV SOLN: 500.0000 mL | INTRAVENOUS | Status: DC

## 2014-05-09 NOTE — Op Note (Signed)
[place]  [address] [place], [address]   COLONOSCOPY PROCEDURE REPORT  PATIENT: Jennifer [name], Jennifer [name]  MR#: [id number] BIRTHDATE: [birth year] , 56  yrs. old GENDER: female ENDOSCOPIST: Gatha Mayer, MD, [facility] PROCEDURE DATE:  05/09/2014 PROCEDURE:   Colonoscopy, surveillance First Screening Colonoscopy - Avg.  risk and is 50 yrs.  old or older - No.  Prior Negative Screening - Now for repeat screening. N/A  History of Adenoma - Now for follow-up colonoscopy & has been > or = to 3 yrs.  Yes hx of adenoma.  Has been 3 or more years since last colonoscopy. ASA CLASS:   Class II INDICATIONS:Surveillance due to prior colonic neoplasia and PH Colon Adenoma. MEDICATIONS: Propofol 300 mg IV, Monitored anesthesia care, and Lidocaine 40 mg IV  DESCRIPTION OF PROCEDURE:   After the risks benefits and alternatives of the procedure were thoroughly explained, informed consent was obtained.  The digital rectal exam revealed no abnormalities of the rectum.   The LB [id number]  endoscope was introduced through the anus and advanced to the cecum, which was identified by both the appendix and ileocecal valve. No adverse events experienced.   The quality of the prep was excellent. (MiraLax was used) (MoviPrep was used)  The instrument was then slowly withdrawn as the colon was fully examined.      COLON FINDINGS: A normal appearing cecum, ileocecal valve, and appendiceal orifice were identified.  The ascending, transverse, descending, sigmoid colon, and rectum appeared unremarkable. Retroflexed views revealed no abnormalities. The time to cecum = 5.2 Withdrawal time = 10.9   The scope was withdrawn and the procedure completed. COMPLICATIONS: There were no immediate complications.  ENDOSCOPIC IMPRESSION: Normal colonoscopy  RECOMMENDATIONS: Repeat Colonoscopy in 5 years. Hx adenomas 2004, 2008 (3) and 2012.  eSigned:  Gatha Mayer, MD, [facility] 05/09/2014  3:54 PM   cc: the Patient and [name], MD

## 2014-05-09 NOTE — Patient Instructions (Addendum)
No polyps today. Repeat colonoscopy again 2021.  I appreciate the opportunity to care for you. Gatha Mayer, MD, FACG  YOU HAD AN ENDOSCOPIC PROCEDURE TODAY AT [facility]:   Refer to the procedure report that was given to you for any specific questions about what was found during the examination.  If the procedure report does not answer your questions, please call your gastroenterologist to clarify.  If you requested that your care partner not be given the details of your procedure findings, then the procedure report has been included in a sealed envelope for you to review at your convenience later.  YOU SHOULD EXPECT: Some feelings of bloating in the abdomen. Passage of more gas than usual.  Walking can help get rid of the air that was put into your GI tract during the procedure and reduce the bloating. If you had a lower endoscopy (such as a colonoscopy or flexible sigmoidoscopy) you may notice spotting of blood in your stool or on the toilet paper. If you underwent a bowel prep for your procedure, you may not have a normal bowel movement for a few days.  Please Note:  You might notice some irritation and congestion in your nose or some drainage.  This is from the oxygen used during your procedure.  There is no need for concern and it should clear up in a day or so.  SYMPTOMS TO REPORT IMMEDIATELY:   Following lower endoscopy (colonoscopy or flexible sigmoidoscopy):  Excessive amounts of blood in the stool  Significant tenderness or worsening of abdominal pains  Swelling of the abdomen that is new, acute  Fever of 100F or higher   For urgent or emergent issues, a gastroenterologist can be reached at any hour by calling [phone].   DIET: Your first meal following the procedure should be a small meal and then it is ok to progress to your normal diet. Heavy or fried foods are harder to digest and may make you feel nauseous or bloated.  Likewise, meals  heavy in dairy and vegetables can increase bloating.  Drink plenty of fluids but you should avoid alcoholic beverages for 24 hours.  ACTIVITY:  You should plan to take it easy for the rest of today and you should NOT DRIVE or use heavy machinery until tomorrow (because of the sedation medicines used during the test).    FOLLOW UP: Our staff will call the number listed on your records the next business day following your procedure to check on you and address any questions or concerns that you may have regarding the information given to you following your procedure. If we do not reach you, we will leave a message.  However, if you are feeling well and you are not experiencing any problems, there is no need to return our call.  We will assume that you have returned to your regular daily activities without incident.  If any biopsies were taken you will be contacted by phone or by letter within the next 1-3 weeks.  Please call us at [phone] if you have not heard about the biopsies in 3 weeks.    SIGNATURES/CONFIDENTIALITY: You and/or your care partner have signed paperwork which will be entered into your electronic medical record.  These signatures attest to the fact that that the information above on your After Visit Summary has been reviewed and is understood.  Full responsibility of the confidentiality of this discharge information lies with you and/or your care-partner.  Discharge  instructions given to patient and/or care partner and handout provided.

## 2014-05-09 NOTE — Progress Notes (Signed)
Stable to RR 

## 2014-05-10 ENCOUNTER — Telehealth: Payer: Self-pay

## 2014-05-10 NOTE — Telephone Encounter (Signed)
  Follow up Call-  Call back number 05/09/2014 03/29/2014  Post procedure Call Back phone  # [phone]  Permission to leave phone message Yes Yes     Patient questions:  Do you have a fever, pain , or abdominal swelling? No. Pain Score  0 *  Have you tolerated food without any problems? Yes.    Have you been able to return to your normal activities? Yes.    Do you have any questions about your discharge instructions: Diet   No. Medications  No. Follow up visit  No.  Do you have questions or concerns about your Care? No.  Actions: * If pain score is 4 or above: No action needed, pain <4.  No problems per the pt. maw

## 2014-05-13 ENCOUNTER — Encounter: Payer: Self-pay | Admitting: Internal Medicine

## 2014-06-08 ENCOUNTER — Other Ambulatory Visit: Payer: Self-pay | Admitting: Emergency Medicine

## 2014-06-17 ENCOUNTER — Other Ambulatory Visit (HOSPITAL_COMMUNITY): Payer: Self-pay | Admitting: Orthopedic Surgery

## 2014-06-17 DIAGNOSIS — T8484XD Pain due to internal orthopedic prosthetic devices, implants and grafts, subsequent encounter: Secondary | ICD-10-CM

## 2014-06-27 ENCOUNTER — Ambulatory Visit (HOSPITAL_COMMUNITY)
Admission: RE | Admit: 2014-06-27 | Discharge: 2014-06-27 | Disposition: A | Discharge status: Home / Self Care | Payer: BLUE CROSS/BLUE SHIELD | Source: Ambulatory Visit | Attending: Orthopedic Surgery | Admitting: Orthopedic Surgery

## 2014-06-27 ENCOUNTER — Encounter (HOSPITAL_COMMUNITY)
Admission: RE | Admit: 2014-06-27 | Discharge: 2014-06-27 | Disposition: A | Discharge status: Home / Self Care | Payer: BLUE CROSS/BLUE SHIELD | Source: Ambulatory Visit | Attending: Orthopedic Surgery | Admitting: Orthopedic Surgery

## 2014-06-27 DIAGNOSIS — M179 Osteoarthritis of knee, unspecified: Secondary | ICD-10-CM | POA: Insufficient documentation

## 2014-06-27 DIAGNOSIS — M25569 Pain in unspecified knee: Secondary | ICD-10-CM | POA: Insufficient documentation

## 2014-06-27 DIAGNOSIS — T8484XD Pain due to internal orthopedic prosthetic devices, implants and grafts, subsequent encounter: Secondary | ICD-10-CM | POA: Insufficient documentation

## 2014-06-27 DIAGNOSIS — X58XXXD Exposure to other specified factors, subsequent encounter: Secondary | ICD-10-CM | POA: Insufficient documentation

## 2014-06-27 DIAGNOSIS — Z96651 Presence of right artificial knee joint: Secondary | ICD-10-CM | POA: Diagnosis not present

## 2014-06-27 MED ORDER — TECHNETIUM TC 99M MEDRONATE IV KIT
26.6000 | PACK | Freq: Once | INTRAVENOUS | Status: AC | PRN
  Administered 2014-06-27: 26.6 via INTRAVENOUS

## 2014-07-14 ENCOUNTER — Telehealth: Payer: Self-pay

## 2014-07-14 NOTE — Telephone Encounter (Signed)
Dr Everlene Farrier    Patient requesting orders for [facility] be put in [facility],  She is coming in this weekend,  CPE next week.

## 2014-07-15 NOTE — Telephone Encounter (Signed)
Pt.notified

## 2014-07-15 NOTE — Telephone Encounter (Signed)
Please place an order for CBC, CMET, KEPPRA, Lipid, and TSH

## 2014-07-16 ENCOUNTER — Other Ambulatory Visit (INDEPENDENT_AMBULATORY_CARE_PROVIDER_SITE_OTHER): Payer: BLUE CROSS/BLUE SHIELD

## 2014-07-16 ENCOUNTER — Other Ambulatory Visit: Payer: Self-pay | Admitting: Emergency Medicine

## 2014-07-16 DIAGNOSIS — E785 Hyperlipidemia, unspecified: Secondary | ICD-10-CM

## 2014-07-16 DIAGNOSIS — M7989 Other specified soft tissue disorders: Secondary | ICD-10-CM | POA: Diagnosis not present

## 2014-07-16 DIAGNOSIS — R569 Unspecified convulsions: Secondary | ICD-10-CM

## 2014-07-16 LAB — CBC WITH DIFFERENTIAL/PLATELET
BASOS ABS: 0 10*3/uL (ref 0.0–0.1)
Basophils Relative: 0 % (ref 0–1)
Eosinophils Absolute: 0.1 10*3/uL (ref 0.0–0.7)
Eosinophils Relative: 2 % (ref 0–5)
HEMATOCRIT: 35.8 % — AB (ref 36.0–46.0)
Hemoglobin: 12.6 g/dL (ref 12.0–15.0)
Lymphocytes Relative: 23 % (ref 12–46)
Lymphs Abs: 1.1 10*3/uL (ref 0.7–4.0)
MCH: 30.5 pg (ref 26.0–34.0)
MCHC: 35.2 g/dL (ref 30.0–36.0)
MCV: 86.7 fL (ref 78.0–100.0)
MONOS PCT: 7 % (ref 3–12)
MPV: 9.5 fL (ref 8.6–12.4)
Monocytes Absolute: 0.3 10*3/uL (ref 0.1–1.0)
NEUTROS ABS: 3.3 10*3/uL (ref 1.7–7.7)
NEUTROS PCT: 68 % (ref 43–77)
Platelets: 295 10*3/uL (ref 150–400)
RBC: 4.13 MIL/uL (ref 3.87–5.11)
RDW: 13.5 % (ref 11.5–15.5)
WBC: 4.8 10*3/uL (ref 4.0–10.5)

## 2014-07-16 LAB — COMPLETE METABOLIC PANEL WITH GFR
ALT: 20 U/L (ref 0–35)
AST: 19 U/L (ref 0–37)
Albumin: 4.2 g/dL (ref 3.5–5.2)
Alkaline Phosphatase: 74 U/L (ref 39–117)
BILIRUBIN TOTAL: 0.5 mg/dL (ref 0.2–1.2)
BUN: 17 mg/dL (ref 6–23)
CO2: 28 mEq/L (ref 19–32)
CREATININE: 0.66 mg/dL (ref 0.50–1.10)
Calcium: 9.3 mg/dL (ref 8.4–10.5)
Chloride: 101 mEq/L (ref 96–112)
GFR, Est Non African American: >89 mL/min
Glucose, Bld: 83 mg/dL (ref 70–99)
Potassium: 4.7 mEq/L (ref 3.5–5.3)
SODIUM: 138 meq/L (ref 135–145)
Total Protein: 6.9 g/dL (ref 6.0–8.3)

## 2014-07-16 LAB — LIPID PANEL
CHOL/HDL RATIO: 5.5 ratio
Cholesterol: 213 mg/dL — ABNORMAL HIGH (ref 0–200)
HDL: 39 mg/dL — ABNORMAL LOW (ref >=46)
LDL Cholesterol: 112 mg/dL — ABNORMAL HIGH (ref 0–99)
Triglycerides: 308 mg/dL — ABNORMAL HIGH (ref <150)
VLDL: 62 mg/dL — ABNORMAL HIGH (ref 0–40)

## 2014-07-16 LAB — TSH: TSH: 3.743 u[IU]/mL (ref 0.350–4.500)

## 2014-07-19 ENCOUNTER — Encounter: Payer: Self-pay | Admitting: Emergency Medicine

## 2014-07-19 ENCOUNTER — Ambulatory Visit (INDEPENDENT_AMBULATORY_CARE_PROVIDER_SITE_OTHER): Payer: BLUE CROSS/BLUE SHIELD | Admitting: Emergency Medicine

## 2014-07-19 VITALS — BP 148/82 | HR 96 | Temp 97.8°F | Resp 16 | Ht 64.5 in | Wt 210.4 lb

## 2014-07-19 DIAGNOSIS — I10 Essential (primary) hypertension: Secondary | ICD-10-CM

## 2014-07-19 DIAGNOSIS — Z0000 Encounter for general adult medical examination without abnormal findings: Secondary | ICD-10-CM

## 2014-07-19 DIAGNOSIS — G4733 Obstructive sleep apnea (adult) (pediatric): Secondary | ICD-10-CM

## 2014-07-19 DIAGNOSIS — F329 Major depressive disorder, single episode, unspecified: Secondary | ICD-10-CM | POA: Diagnosis not present

## 2014-07-19 DIAGNOSIS — R569 Unspecified convulsions: Secondary | ICD-10-CM | POA: Diagnosis not present

## 2014-07-19 DIAGNOSIS — E669 Obesity, unspecified: Secondary | ICD-10-CM | POA: Diagnosis not present

## 2014-07-19 DIAGNOSIS — F418 Other specified anxiety disorders: Secondary | ICD-10-CM | POA: Insufficient documentation

## 2014-07-19 DIAGNOSIS — F32A Depression, unspecified: Secondary | ICD-10-CM | POA: Insufficient documentation

## 2014-07-19 DIAGNOSIS — R5383 Other fatigue: Secondary | ICD-10-CM

## 2014-07-19 NOTE — Patient Instructions (Signed)

## 2014-07-19 NOTE — Progress Notes (Addendum)
Subjective:  This chart was scribed for [name], MD by Leandra Kern, Medical Scribe. This patient was seen in Room 23 and the patient's care was started at 3:58 PM.   Patient ID: Jennifer [name], female    DOB: 05/12/1950, 64 y.o.   MRN: [id number]  HPI HPI Comments: Jennifer [name] is a 64 y.o. female with a PMHx of seizures, HTN, and OSA who presents to [facility] for a complete physical exam.  Pt notes that she had her seizure about a year ago, and she feels that affected her. She reports that she started having depression symptoms, however, never really had any thoughts of harming herself or others. She notes that she has lost her energy, she does not do much physical exercise anymore. She feels fatigued most of the time.  Additionally, pt notes that being on Keppra might have caused an increase in her appetite. She reports that joining a gym might help her condition. Pt indicates that now she is having financial restrains as a result of her leaving her job. Pt realizes that medications will not be able to help her problems, she is aware that being less harsh on herself and trying to make changes in her lifestyle will give her the best results.  Pt indicates that she has been compliant with using her CPAP machine.  Pt is UTD with her Pap smear testing, mammogram, and colonoscopy.  Pt notes that she has 18 months until retirement, and after that she is planning to move to [place].   Review of Systems  Constitutional: Positive for activity change (decreased), appetite change and fatigue.  Psychiatric/Behavioral: Positive for dysphoric mood. The patient is nervous/anxious.       Objective:   Physical Exam  Constitutional: She is oriented to person, place, and time. She appears well-developed and well-nourished. No distress.  HENT:  Head: Normocephalic and atraumatic.  Eyes: EOM are normal. Pupils are equal, round, and reactive to light.  Neck: Neck  supple.  Cardiovascular: Normal rate and regular rhythm.  Exam reveals no gallop and no friction rub.   No murmur heard. Pulmonary/Chest: Effort normal and breath sounds normal. No respiratory distress. She has no wheezes. She has no rales.  Abdominal: Soft.  Musculoskeletal: Normal range of motion. She exhibits no edema.  Neurological: She is alert and oriented to person, place, and time. No cranial nerve deficit.  Skin: Skin is warm and dry.  No breast masses noted  Psychiatric: She has a normal mood and affect. Her behavior is normal.  Nursing note and vitals reviewed.     Assessment & Plan:  1. Annual physical exam She had her mammogram this morning. She is due for Pap smear next year  2. Essential hypertension, benign Blood pressure is borderline elevated hopefully will improve with weight loss and starting her exercise program  3. OSA (obstructive sleep apnea) She is compliant with her CPAP machine.  4. Seizures She is currently on Keppra with no further seizures  5. Obesity She is agreeable to go back to [employer] and start her exercise program  6. Other fatigue I think her fatigue is multifactorial related to her weight gain and seizure medicines as well as her obstructive sleep apnea. 7.Hyperlipidemia Triglycerides are up and HDL are down. She needs to be more aggressive about weight loss. 8.Depression I believe this is multifactorial. She is due to retire next year. Her son is getting married and this is somewhat stressful  from  financial standpoint. She is also very frustrated with her weight gain. I did not change her depression medicine but did caution her about the use of Ultram and Celexa together with serotonin syndrome I personally performed the services described in this documentation, which was scribed in my presence. The recorded information has been reviewed and is accurate.  [name], MD  [facility] [place]  [facility]  07/19/2014 5:31 PM

## 2014-07-20 ENCOUNTER — Telehealth: Payer: Self-pay | Admitting: Emergency Medicine

## 2014-07-20 LAB — LEVETIRACETAM LEVEL: Keppra (Levetiracetam): 17.6 ug/mL

## 2014-07-20 NOTE — Telephone Encounter (Signed)
Call Debbie and let her know I forwarded a copy of her Keppra level to Dr. Delice Lesch

## 2014-07-21 NOTE — Telephone Encounter (Signed)
Results forwarded to Dr. Delice Lesch be sure Jennifer [name] was called regarding her Keppra level

## 2014-07-21 NOTE — Telephone Encounter (Signed)
Spoke with pt, advised message from Dr. Everlene Farrier. Pt wants her levels. I did not know how to interpret the labs.

## 2014-07-21 NOTE — Telephone Encounter (Signed)
Please add a vitamin D level to bloodwork at the lab

## 2014-07-21 NOTE — Telephone Encounter (Signed)
Pt.notified

## 2014-07-21 NOTE — Telephone Encounter (Signed)
Please add a vitamin D level to bloodwork at the lab  Spoke with [facility] added this test on

## 2014-07-22 LAB — VITAMIN D 25 HYDROXY (VIT D DEFICIENCY, FRACTURES): Vit D, 25-Hydroxy: 25 ng/mL — ABNORMAL LOW (ref 30–100)

## 2014-07-27 ENCOUNTER — Encounter: Payer: Self-pay | Admitting: Emergency Medicine

## 2014-08-01 ENCOUNTER — Ambulatory Visit (INDEPENDENT_AMBULATORY_CARE_PROVIDER_SITE_OTHER): Payer: BLUE CROSS/BLUE SHIELD | Admitting: Neurology

## 2014-08-01 ENCOUNTER — Encounter: Payer: Self-pay | Admitting: Neurology

## 2014-08-01 VITALS — BP 158/82 | HR 72 | Resp 16 | Ht 64.5 in | Wt 207.0 lb

## 2014-08-01 DIAGNOSIS — E669 Obesity, unspecified: Secondary | ICD-10-CM | POA: Diagnosis not present

## 2014-08-01 DIAGNOSIS — Z9989 Dependence on other enabling machines and devices: Principal | ICD-10-CM

## 2014-08-01 DIAGNOSIS — G4733 Obstructive sleep apnea (adult) (pediatric): Secondary | ICD-10-CM | POA: Diagnosis not present

## 2014-08-01 NOTE — Patient Instructions (Signed)
Please continue using your CPAP regularly. While your insurance requires that you use CPAP at least 4 hours each night on 70% of the nights, I recommend, that you not skip any nights and use it throughout the night if you can. Getting used to CPAP and staying with the treatment long term does take time and patience and discipline. Untreated obstructive sleep apnea when it is moderate to severe can have an adverse impact on cardiovascular health and raise her risk for heart disease, arrhythmias, hypertension, congestive heart failure, stroke and diabetes. Untreated obstructive sleep apnea causes sleep disruption, nonrestorative sleep, and sleep deprivation. This can have an impact on your day to day functioning and cause daytime sleepiness and impairment of cognitive function, memory loss, mood disturbance, and problems focussing. Using CPAP regularly can improve these symptoms. Keep up the good work! I will see you back in 12 months.    
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
## 2014-09-09 ENCOUNTER — Encounter: Payer: Self-pay | Admitting: Neurology

## 2014-09-09 ENCOUNTER — Ambulatory Visit (INDEPENDENT_AMBULATORY_CARE_PROVIDER_SITE_OTHER): Payer: BLUE CROSS/BLUE SHIELD | Admitting: Neurology

## 2014-09-09 VITALS — BP 140/88 | HR 65 | Wt 206.0 lb

## 2014-09-09 DIAGNOSIS — R569 Unspecified convulsions: Secondary | ICD-10-CM

## 2014-09-09 DIAGNOSIS — G40009 Localization-related (focal) (partial) idiopathic epilepsy and epileptic syndromes with seizures of localized onset, not intractable, without status epilepticus: Secondary | ICD-10-CM

## 2014-09-09 MED ORDER — LEVETIRACETAM 500 MG PO TABS: 500.0000 mg | ORAL_TABLET | Freq: Two times a day (BID) | ORAL | Status: DC
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
## 2014-09-12 ENCOUNTER — Encounter: Payer: Self-pay | Admitting: Neurology

## 2014-10-11 ENCOUNTER — Encounter: Payer: Self-pay | Admitting: Emergency Medicine

## 2014-10-17 ENCOUNTER — Ambulatory Visit (INDEPENDENT_AMBULATORY_CARE_PROVIDER_SITE_OTHER): Payer: BLUE CROSS/BLUE SHIELD | Admitting: Family Medicine

## 2014-10-17 VITALS — BP 144/90 | HR 71 | Temp 98.4°F | Resp 18 | Ht 64.5 in | Wt 215.1 lb

## 2014-10-17 DIAGNOSIS — K047 Periapical abscess without sinus: Secondary | ICD-10-CM

## 2014-10-17 MED ORDER — CLINDAMYCIN HCL 150 MG PO CAPS: 150.0000 mg | ORAL_CAPSULE | Freq: Three times a day (TID) | ORAL | Status: DC

## 2014-10-17 NOTE — Patient Instructions (Signed)
Call me. Not getting better at [phone]

## 2014-10-17 NOTE — Progress Notes (Signed)
° °  Subjective:    Patient ID: Jennifer [name], female    DOB: [birth year], 64 y.o.   MRN: [id number] This chart was scribed for Robyn Haber, MD by Marti Sleigh, Medical Scribe. This patient was seen in Room 10 and the patient's care was started a 6:48 PM.  Chief Complaint  Patient presents with   Facial Pain    left side & radiates up to eye-started this morning    HPI HPI Comments: Jennifer [name] is a 64 y.o. female who presents to [facility] complaining of left sided tooth pain that radiates up into her face and eye. She also has associated swelling in her left face.   Review of Systems  Constitutional: Negative for fever and chills.  HENT: Positive for dental problem and facial swelling.   Neurological: Negative for weakness and headaches.       Objective:   Physical Exam  Constitutional: She is oriented to person, place, and time. She appears well-developed and well-nourished. No distress.  HENT:  Head: Normocephalic and atraumatic.  Eyes: Pupils are equal, round, and reactive to light.  Neck: Neck supple.  Cardiovascular: Normal rate.   Pulmonary/Chest: Effort normal. No respiratory distress.  Musculoskeletal: Normal range of motion.  Neurological: She is alert and oriented to person, place, and time. Coordination normal.  Skin: Skin is warm and dry. She is not diaphoretic.  Psychiatric: She has a normal mood and affect. Her behavior is normal.  Nursing note and vitals reviewed.  swollen left cheek Mild gingival erythema near tooth #15  Filed Vitals:   10/17/14 1845  BP: 144/90  Pulse: 71  Temp: 98.4 F (36.9 C)  TempSrc: Oral  Resp: 18  Height: 5' 4.5" (1.638 m)  Weight: 215 lb 2 oz (97.58 kg)  SpO2: 98%      Assessment & Plan:   This chart was scribed in my presence and reviewed by me personally.    ICD-9-CM ICD-10-CM   1. Apical abscess 522.5 K04.7 clindamycin (CLEOCIN) 150 MG capsule     Signed, Robyn Haber, MD    By signing my  name below, I, [name], attest that this documentation has been prepared under the direction and in the presence of Robyn Haber, MD. Electronically Signed: [name], ER Scribe. 10/17/2014. 6:48 PM.

## 2014-10-24 ENCOUNTER — Telehealth: Payer: Self-pay

## 2014-10-24 NOTE — Telephone Encounter (Signed)
PA completed on covermymeds for pravastatin. Pending.

## 2014-10-31 NOTE — Telephone Encounter (Signed)
PA denied because the "same dose can be given by taking one 80 mg pill a day". Do you want to send in new Rx for the 80 mg?

## 2014-11-01 NOTE — Telephone Encounter (Signed)
I see no notes inst pt to take 80 mg, but almost all of the Rxs in EPIC are for 40 mg, two tabs daily. LMOM for pt to CB to verify current dose.

## 2014-11-01 NOTE — Telephone Encounter (Signed)
I see no documentation advising her to take 80 mg daily.  Philis Fendt, MS, PA-C   12:23 PM, 11/01/2014

## 2014-11-03 MED ORDER — PRAVASTATIN SODIUM 40 MG PO TABS: 40.0000 mg | ORAL_TABLET | Freq: Every day | ORAL | Status: DC

## 2014-11-03 NOTE — Telephone Encounter (Signed)
Pt called back and advised that we can send in a new Rx for just 40 mg QD, because Dr Everlene Farrier had decreased the dose to that anyway. Done.

## 2015-01-30 ENCOUNTER — Other Ambulatory Visit: Payer: Self-pay | Admitting: Emergency Medicine

## 2015-01-31 ENCOUNTER — Other Ambulatory Visit: Payer: Self-pay | Admitting: Emergency Medicine

## 2015-02-03 ENCOUNTER — Other Ambulatory Visit: Payer: Self-pay | Admitting: Emergency Medicine

## 2015-02-04 ENCOUNTER — Other Ambulatory Visit: Payer: Self-pay | Admitting: Emergency Medicine

## 2015-02-07 ENCOUNTER — Other Ambulatory Visit: Payer: Self-pay | Admitting: Emergency Medicine

## 2015-02-07 DIAGNOSIS — F32A Depression, unspecified: Secondary | ICD-10-CM

## 2015-02-07 DIAGNOSIS — F329 Major depressive disorder, single episode, unspecified: Secondary | ICD-10-CM

## 2015-02-07 MED ORDER — CITALOPRAM HYDROBROMIDE 40 MG PO TABS: ORAL_TABLET | ORAL | Status: DC

## 2015-03-13 ENCOUNTER — Ambulatory Visit (INDEPENDENT_AMBULATORY_CARE_PROVIDER_SITE_OTHER): Payer: BLUE CROSS/BLUE SHIELD | Admitting: Neurology

## 2015-03-13 ENCOUNTER — Encounter: Payer: Self-pay | Admitting: Neurology

## 2015-03-13 VITALS — BP 128/78 | HR 67 | Resp 16 | Wt 191.0 lb

## 2015-03-13 DIAGNOSIS — G40009 Localization-related (focal) (partial) idiopathic epilepsy and epileptic syndromes with seizures of localized onset, not intractable, without status epilepticus: Secondary | ICD-10-CM | POA: Diagnosis not present

## 2015-03-13 DIAGNOSIS — R569 Unspecified convulsions: Secondary | ICD-10-CM | POA: Diagnosis not present

## 2015-03-13 MED ORDER — LEVETIRACETAM 500 MG PO TABS: 500.0000 mg | ORAL_TABLET | Freq: Two times a day (BID) | ORAL | Status: DC
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
## 2015-06-14 ENCOUNTER — Telehealth: Payer: Self-pay | Admitting: Emergency Medicine

## 2015-06-14 NOTE — Telephone Encounter (Signed)
Patient request to schedule her complete physical with Dr. Everlene Farrier. I stated to the patient Dr. Everlene Farrier is booked for appointments. He will see patients at the walk in after July. Please call patient to schedule physical.

## 2015-07-05 NOTE — Telephone Encounter (Signed)
LVM for patient to call me back to schedule the annual physical.   Patient has not returned my calls.

## 2015-07-30 ENCOUNTER — Other Ambulatory Visit: Payer: Self-pay | Admitting: Emergency Medicine

## 2015-07-31 ENCOUNTER — Encounter (HOSPITAL_COMMUNITY): Admission: RE | Payer: Self-pay | Source: Ambulatory Visit

## 2015-07-31 ENCOUNTER — Inpatient Hospital Stay (HOSPITAL_COMMUNITY)
Admission: RE | Admit: 2015-07-31 | Payer: BLUE CROSS/BLUE SHIELD | Source: Ambulatory Visit | Admitting: Orthopedic Surgery

## 2015-07-31 SURGERY — ARTHROPLASTY, HIP, TOTAL, ANTERIOR APPROACH
Anesthesia: Spinal | Laterality: Right

## 2015-08-01 ENCOUNTER — Ambulatory Visit (INDEPENDENT_AMBULATORY_CARE_PROVIDER_SITE_OTHER): Payer: BLUE CROSS/BLUE SHIELD | Admitting: Neurology

## 2015-08-01 ENCOUNTER — Encounter: Payer: Self-pay | Admitting: Neurology

## 2015-08-01 ENCOUNTER — Encounter: Payer: Self-pay | Admitting: Emergency Medicine

## 2015-08-01 VITALS — BP 150/88 | HR 72 | Resp 16 | Ht 64.5 in | Wt 192.0 lb

## 2015-08-01 DIAGNOSIS — Z9989 Dependence on other enabling machines and devices: Principal | ICD-10-CM

## 2015-08-01 DIAGNOSIS — G4733 Obstructive sleep apnea (adult) (pediatric): Secondary | ICD-10-CM | POA: Diagnosis not present
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
## 2015-08-01 NOTE — Patient Instructions (Signed)
We will request a mask refit.  You are great with your CPAP usage! Hope, your hip and knee do well.

## 2015-08-01 NOTE — Progress Notes (Signed)
Jennifer [name] and let her know I would like to see her before I retire. Tell her to give me about a week's notice and I can put her in the system as a scheduled appointment.

## 2015-08-16 ENCOUNTER — Other Ambulatory Visit: Payer: Self-pay | Admitting: Orthopedic Surgery

## 2015-08-29 ENCOUNTER — Telehealth: Payer: Self-pay | Admitting: *Deleted

## 2015-08-29 NOTE — Telephone Encounter (Signed)
Lmom that patient has to be seen for medical clearance per Dr. Everlene Farrier.  Form is in nurses box.

## 2015-09-02 ENCOUNTER — Ambulatory Visit (INDEPENDENT_AMBULATORY_CARE_PROVIDER_SITE_OTHER): Payer: BLUE CROSS/BLUE SHIELD | Admitting: Emergency Medicine

## 2015-09-02 VITALS — BP 162/108 | HR 71 | Temp 98.3°F | Ht 64.0 in | Wt 192.2 lb

## 2015-09-02 DIAGNOSIS — R569 Unspecified convulsions: Secondary | ICD-10-CM

## 2015-09-02 DIAGNOSIS — Z1159 Encounter for screening for other viral diseases: Secondary | ICD-10-CM

## 2015-09-02 DIAGNOSIS — Z23 Encounter for immunization: Secondary | ICD-10-CM | POA: Diagnosis not present

## 2015-09-02 DIAGNOSIS — R3129 Other microscopic hematuria: Secondary | ICD-10-CM

## 2015-09-02 DIAGNOSIS — R21 Rash and other nonspecific skin eruption: Secondary | ICD-10-CM

## 2015-09-02 DIAGNOSIS — Z0000 Encounter for general adult medical examination without abnormal findings: Secondary | ICD-10-CM

## 2015-09-02 DIAGNOSIS — G4733 Obstructive sleep apnea (adult) (pediatric): Secondary | ICD-10-CM | POA: Diagnosis not present

## 2015-09-02 LAB — LIPID PANEL
CHOL/HDL RATIO: 5.4 ratio — AB (ref <=5.0)
CHOLESTEROL: 322 mg/dL — AB (ref 125–200)
HDL: 60 mg/dL (ref >=46)
LDL Cholesterol: 219 mg/dL — ABNORMAL HIGH (ref <130)
TRIGLYCERIDES: 216 mg/dL — AB (ref <150)
VLDL: 43 mg/dL — AB (ref <30)

## 2015-09-02 LAB — COMPLETE METABOLIC PANEL WITH GFR
ALBUMIN: 4.4 g/dL (ref 3.6–5.1)
ALK PHOS: 85 U/L (ref 33–130)
ALT: 13 U/L (ref 6–29)
AST: 15 U/L (ref 10–35)
BILIRUBIN TOTAL: 0.5 mg/dL (ref 0.2–1.2)
BUN: 22 mg/dL (ref 7–25)
CALCIUM: 9.5 mg/dL (ref 8.6–10.4)
CO2: 27 mmol/L (ref 20–31)
CREATININE: 0.72 mg/dL (ref 0.50–0.99)
Chloride: 102 mmol/L (ref 98–110)
GFR, EST NON AFRICAN AMERICAN: 89 mL/min (ref >=60)
Glucose, Bld: 81 mg/dL (ref 65–99)
Potassium: 4.3 mmol/L (ref 3.5–5.3)
Sodium: 136 mmol/L (ref 135–146)
TOTAL PROTEIN: 7.5 g/dL (ref 6.1–8.1)

## 2015-09-02 LAB — POCT URINALYSIS DIP (MANUAL ENTRY)
BILIRUBIN UA: NEGATIVE
BILIRUBIN UA: NEGATIVE
Glucose, UA: NEGATIVE
Nitrite, UA: NEGATIVE
PROTEIN UA: NEGATIVE
SPEC GRAV UA: 1.015
Urobilinogen, UA: 0.2
pH, UA: 6

## 2015-09-02 LAB — POCT SKIN KOH: Skin KOH, POC: NEGATIVE

## 2015-09-02 LAB — POCT CBC
GRANULOCYTE PERCENT: 69.9 % (ref 37–80)
HEMATOCRIT: 34.1 % — AB (ref 37.7–47.9)
HEMOGLOBIN: 12 g/dL — AB (ref 12.2–16.2)
LYMPH, POC: 1.2 (ref 0.6–3.4)
MCH, POC: 30.9 pg (ref 27–31.2)
MCHC: 35.3 g/dL (ref 31.8–35.4)
MCV: 87.4 fL (ref 80–97)
MID (cbc): 0.3 (ref 0–0.9)
MPV: 7 fL (ref 0–99.8)
POC GRANULOCYTE: 3.5 (ref 2–6.9)
POC LYMPH %: 23.6 % (ref 10–50)
POC MID %: 6.5 % (ref 0–12)
Platelet Count, POC: 290 10*3/uL (ref 142–424)
RBC: 3.9 M/uL — AB (ref 4.04–5.48)
RDW, POC: 13.4 %
WBC: 5 10*3/uL (ref 4.6–10.2)

## 2015-09-02 LAB — HEPATITIS C ANTIBODY: HCV Ab: NEGATIVE

## 2015-09-02 MED ORDER — LOSARTAN POTASSIUM 50 MG PO TABS: 50.0000 mg | ORAL_TABLET | Freq: Every day | ORAL | 3 refills | Status: DC

## 2015-09-02 NOTE — Progress Notes (Signed)
By signing my name below, I, Jennifer [name], attest that this documentation has been prepared under the direction and in the presence of Jennifer Jordan, MD. Electronically Signed: Judithe [name], ER Scribe. 09/02/2015. 11:57 AM.  Chief Complaint:  Chief Complaint  Patient presents with  . Annual Exam    HPI: Jennifer [name] is a 65 y.o. female who reports to [facility] reporting today for a full physical.  complaining of right hip pain for the last six months. She had a recent hip x-ray which showed severe deterioration. She is using a walker. She has a past surgical hx of right knee replacement. She has not had a seizure in two years. She has been cleared for surgery on her hip which is scheduled for 09/25/15. She is planning on working for two more years. She is planning on retiring to [place]. She denies CP, abdominal pain SOB. She is also complaining of a mildly odd sensation in the bottoms of her feet that is not painful. She is UTD on her mammogram and colonoscopy.    Past Medical History:  Diagnosis Date  . Allergy   . Anxiety   . Depression   . High cholesterol   . Hx of adenomatous polyp of colon    2012 - [facility]  . Hyperlipidemia   . OSA (obstructive sleep apnea) 04/24/2012  . Osteoarthritis   . PLMD (periodic limb movement disorder) 04/24/2012  . Seizures ([place])    08/29/13  . Sleep apnea    wears CPAP  . Sleep apnea    Past Surgical History:  Procedure Laterality Date  . APPENDECTOMY    . CESAREAN SECTION    . CHOLECYSTECTOMY  1978  . COLONOSCOPY    . JOINT REPLACEMENT  1998   right knee  . OSTEOTOMY PROXIMAL FEMORAL    . REPLACEMENT TOTAL KNEE     Right   Social History   Social History  . Marital status: Divorced    Spouse name: N/A  . Number of children: 1  . Years of education: N/A   Occupational History  . [employer]   Social History Main Topics  . Smoking status: Never Smoker  . Smokeless tobacco: Never Used  . Alcohol  use No  . Drug use: No  . Sexual activity: No   Other Topics Concern  . None   Social History Narrative   ** Merged History Encounter **       Lives with cats.     Family History  Problem Relation Age of Onset  . Cancer Father 34  . Esophageal cancer Father   . Heart disease Mother 72    Aortic valve disease  . Atrial fibrillation Brother   . Sleep apnea Brother   . Sleep apnea Sister   . Sleep apnea Sister   . Stomach cancer Maternal Grandmother   . Colon cancer Neg Hx   . Rectal cancer Neg Hx    Allergies  Allergen Reactions  . Crestor [Rosuvastatin]   . Crestor [Rosuvastatin] Other (See Comments)    Muscle aches  . Neomycin-Bacitracin Zn-Polymyx Swelling    Ophthalmic only(eye swelling)  . Neomycin-Bacitracin-Polymyxin [Bacitracin-Neomycin-Polymyxin] Swelling    Reaction to eye drops  . Codeine Rash  . Codeine Rash  . Feldene [Piroxicam] Rash  . Penicillins Rash  . Penicillins Rash  . Piroxicam Rash  . Sulfa Antibiotics Rash  . Sulfonamide Derivatives Rash   Prior to Admission medications   Medication Sig Start  Date End Date Taking? Authorizing Provider  acetaminophen (TYLENOL) 325 MG tablet Take 650 mg by mouth as needed.    Historical Provider, MD  aspirin EC 81 MG tablet Take 81 mg by mouth daily.    Historical Provider, MD  cetirizine (ZYRTEC) 10 MG tablet Take 10 mg by mouth daily.    Historical Provider, MD  citalopram (CELEXA) 40 MG tablet TAKE half TABLET BY MOUTH ONCE DAILY  20mg  02/07/15   Darlyne Russian, MD  HYDROcodone-acetaminophen (NORCO/VICODIN) 5-325 MG tablet  07/11/15   Historical Provider, MD  ibuprofen (ADVIL,MOTRIN) 200 MG tablet Take 200 mg by mouth every 6 (six) hours as needed.    Historical Provider, MD  levETIRAcetam (KEPPRA) 500 MG tablet Take 1 tablet (500 mg total) by mouth 2 (two) times daily. 03/13/15   Cameron Sprang, MD  MELATONIN PO Take 5 mg by mouth daily.    Historical Provider, MD  meloxicam (MOBIC) 15 MG tablet Take 15 mg by  mouth daily.    Historical Provider, MD  Multiple Vitamin (MULTIVITAMIN WITH MINERALS) TABS tablet Take 1 tablet by mouth daily.    Historical Provider, MD  pravastatin (PRAVACHOL) 40 MG tablet TAKE ONE TABLET BY MOUTH ONCE DAILY 01/31/15   Darlyne Russian, MD  traMADol (ULTRAM) 50 MG tablet Take by mouth 1 day or 1 dose.    Historical Provider, MD     ROS: The patient denies fevers, chills, night sweats, unintentional weight loss, chest pain, palpitations, wheezing, dyspnea on exertion, nausea, vomiting, abdominal pain, dysuria, hematuria, melena, numbness, weakness, or tingling.  All other systems have been reviewed and were otherwise negative with the exception of those mentioned in the HPI and as above.    PHYSICAL EXAM: Vitals:   09/02/15 1117  BP: (!) 162/108  Pulse: 71  Temp: 98.3 F (36.8 C)   Body mass index is 33 kg/m.   General: Alert, no acute distress HEENT:  Normocephalic, atraumatic, oropharynx patent. Eye: Juliette Mangle [facility] Cardiovascular:  Regular rate and rhythm, no rubs murmurs or gallops.  No Carotid bruits, radial pulse intact. No pedal edema.  Respiratory: Clear to auscultation bilaterally.  No wheezes, rales, or rhonchi.  No cyanosis, no use of accessory musculature Abdominal: No organomegaly, abdomen is soft and non-tender, positive bowel sounds.  No masses. Musculoskeletal: Gait intact. No edema, tenderness Skin: 1/1.5cm scaly indurated area between her buttocks on the left side. Surgical scar over the right knee. Pain with movement of the right hip. Neurologic: Facial musculature symmetric. Psychiatric: Patient acts appropriately throughout our interaction. Lymphatic: No cervical or submandibular lymphadenopathy    LABS: Results for orders placed or performed in visit on 09/02/15  POCT CBC  Result Value Ref Range   WBC 5.0 4.6 - 10.2 K/uL   Lymph, poc 1.2 0.6 - 3.4   POC LYMPH PERCENT 23.6 10 - 50 %L   MID (cbc) 0.3 0 - 0.9   POC MID % 6.5 0 - 12 %M    POC Granulocyte 3.5 2 - 6.9   Granulocyte percent 69.9 37 - 80 %G   RBC 3.90 (A) 4.04 - 5.48 M/uL   Hemoglobin 12.0 (A) 12.2 - 16.2 g/dL   HCT, POC 34.1 (A) 37.7 - 47.9 %   MCV 87.4 80 - 97 fL   MCH, POC 30.9 27 - 31.2 pg   MCHC 35.3 31.8 - 35.4 g/dL   RDW, POC 13.4 %   Platelet Count, POC 290 142 - 424 K/uL   MPV  7.0 0 - 99.8 fL  POCT urinalysis dipstick  Result Value Ref Range   Color, UA yellow yellow   Clarity, UA clear clear   Glucose, UA negative negative   Bilirubin, UA negative negative   Ketones, POC UA negative negative   Spec Grav, UA 1.015    Blood, UA moderate (A) negative   pH, UA 6.0    Protein Ur, POC negative negative   Urobilinogen, UA 0.2    Nitrite, UA Negative Negative   Leukocytes, UA Trace (A) Negative  POCT Skin KOH  Result Value Ref Range   Skin KOH, POC Negative    Meds ordered this encounter  Medications  . losartan (COZAAR) 50 MG tablet    Sig: Take 1 tablet (50 mg total) by mouth daily.    Dispense:  90 tablet    Refill:  3   EKG/XRAY:   Primary read interpreted by Dr. Everlene Farrier at [facility]. Sinus bradycardia PR interval is short at  0.118  ASSESSMENT/PLAN: Patient cleared for surgery. She has been cleared by her sleep apnea doctor and also her neurologist who sees her for seizures. She has a short PR interval but no symptoms of palpitations or rapid heartbeat. She needs to be monitored because of her seizure disorder and sleep apnea and postop for any arrhythmias related to her short PR interval.Patient had moderate blood on her urine dipstick.Patient just had her Pap smear and was spotting and this may not obtain a clean-catch will repeat next week. I personally performed the services described in this documentation, which was scribed i, n my presence. The recorded information has been reviewed and is accurate.She has a rash in her buttocks referral made to dermatology.   Gross sideeffects, risk and benefits, and alternatives of medications d/w  patient. Patient is aware that all medications have potential sideeffects and we are unable to predict every sideeffect or drug-drug interaction that may occur.  [name] MD 09/02/2015 11:57 AM

## 2015-09-02 NOTE — Patient Instructions (Addendum)
IF you received an x-ray today, you will receive an invoice from [facility]. Please contact [facility] at [phone] with questions or concerns regarding your invoice.   IF you received labwork today, you will receive an invoice from [employer]. Please contact [facility] at [phone] with questions or concerns regarding your invoice.   Our billing staff will not be able to assist you with questions regarding bills from these companies.  You will be contacted with the lab results as soon as they are available. The fastest way to get your results is to activate your My Chart account. Instructions are located on the last page of this paperwork. If you have not heard from [place] regarding the results in 2 weeks, please contact this office.     Flu shotInfluenza (Flu) Vaccine (Inactivated or Recombinant):  1. Why get vaccinated? Influenza ("flu") is a contagious disease that spreads around the [place] every year, usually between October and May. Flu is caused by influenza viruses, and is spread mainly by coughing, sneezing, and close contact. Anyone can get flu. Flu strikes suddenly and can last several days. Symptoms vary by age, but can include:  fever/chills  sore throat  muscle aches  fatigue  cough  headache  runny or stuffy nose Flu can also lead to pneumonia and blood infections, and cause diarrhea and seizures in children. If you have a medical condition, such as heart or lung disease, flu can make it worse. Flu is more dangerous for some people. Infants and young children, people 20 years of age and older, pregnant women, and people with certain health conditions or a weakened immune system are at greatest risk. Each year thousands of people in the [place] die from flu, and many more are hospitalized. Flu vaccine can:  keep you from getting flu,  make flu less severe if you do get it, and  keep you from  spreading flu to your family and other people. 2. Inactivated and recombinant flu vaccines A dose of flu vaccine is recommended every flu season. Children 6 months through 90 years of age may need two doses during the same flu season. Everyone else needs only one dose each flu season. Some inactivated flu vaccines contain a very small amount of a mercury-based preservative called thimerosal. Studies have not shown thimerosal in vaccines to be harmful, but flu vaccines that do not contain thimerosal are available. There is no live flu virus in flu shots. They cannot cause the flu. There are many flu viruses, and they are always changing. Each year a new flu vaccine is made to protect against three or four viruses that are likely to cause disease in the upcoming flu season. But even when the vaccine doesn't exactly match these viruses, it may still provide some protection. Flu vaccine cannot prevent:  flu that is caused by a virus not covered by the vaccine, or  illnesses that look like flu but are not. It takes about 2 weeks for protection to develop after vaccination, and protection lasts through the flu season. 3. Some people should not get this vaccine Tell the person who is giving you the vaccine:  If you have any severe, life-threatening allergies. If you ever had a life-threatening allergic reaction after a dose of flu vaccine, or have a severe allergy to any part of this vaccine, you may be advised not to get vaccinated. Most, but not all, types of flu vaccine contain a small amount of  egg protein.  If you ever had Guillain-Barre Syndrome (also called GBS). Some people with a history of GBS should not get this vaccine. This should be discussed with your doctor.  If you are not feeling well. It is usually okay to get flu vaccine when you have a mild illness, but you might be asked to come back when you feel better. 4. Risks of a vaccine reaction With any medicine, including vaccines, there  is a chance of reactions. These are usually mild and go away on their own, but serious reactions are also possible. Most people who get a flu shot do not have any problems with it. Minor problems following a flu shot include:  soreness, redness, or swelling where the shot was given  hoarseness  sore, red or itchy eyes  cough  fever  aches  headache  itching  fatigue If these problems occur, they usually begin soon after the shot and last 1 or 2 days. More serious problems following a flu shot can include the following:  There may be a small increased risk of Guillain-Barre Syndrome (GBS) after inactivated flu vaccine. This risk has been estimated at 1 or 2 additional cases per million people vaccinated. This is much lower than the risk of severe complications from flu, which can be prevented by flu vaccine.  Young children who get the flu shot along with pneumococcal vaccine (PCV13) and/or DTaP vaccine at the same time might be slightly more likely to have a seizure caused by fever. Ask your doctor for more information. Tell your doctor if a child who is getting flu vaccine has ever had a seizure. Problems that could happen after any injected vaccine:  People sometimes faint after a medical procedure, including vaccination. Sitting or lying down for about 15 minutes can help prevent fainting, and injuries caused by a fall. Tell your doctor if you feel dizzy, or have vision changes or ringing in the ears.  Some people get severe pain in the shoulder and have difficulty moving the arm where a shot was given. This happens very rarely.  Any medication can cause a severe allergic reaction. Such reactions from a vaccine are very rare, estimated at about 1 in a million doses, and would happen within a few minutes to a few hours after the vaccination. As with any medicine, there is a very remote chance of a vaccine causing a serious injury or death. The safety of vaccines is always being  monitored. For more information, visit: http://www.aguilar.org/ 5. What if there is a serious reaction? What should I look for?  Look for anything that concerns you, such as signs of a severe allergic reaction, very high fever, or unusual behavior. Signs of a severe allergic reaction can include hives, swelling of the face and throat, difficulty breathing, a fast heartbeat, dizziness, and weakness. These would start a few minutes to a few hours after the vaccination. What should I do?  If you think it is a severe allergic reaction or other emergency that can't wait, call 9-1-1 and get the person to the nearest hospital. Otherwise, call your doctor.  Reactions should be reported to the Vaccine Adverse Event Reporting System (VAERS). Your doctor should file this report, or you can do it yourself through the VAERS web site at www.vaers.SamedayNews.es, or by calling 216-286-9831. VAERS does not give medical advice. 6. The National Vaccine Injury Compensation Program The National Vaccine Injury Compensation Program (VICP) is a federal program that was created to compensate people who  may have been injured by certain vaccines. Persons who believe they may have been injured by a vaccine can learn about the program and about filing a claim by calling (765)090-1192 or visiting the [place] website at GoldCloset.com.ee. There is a time limit to file a claim for compensation. 7. How can I learn more?  Ask your healthcare provider. He or she can give you the vaccine package insert or suggest other sources of information.  Call your local or state health department.  Contact the Centers for Disease Control and Prevention (CDC):  Call 779-285-6880 (1-800-CDC-INFO) or  Visit CDC's website at https://gibson.com/ Vaccine Information Statement Inactivated Influenza Vaccine (08/13/2013)   This information is not intended to replace advice given to you by your health care provider. Make sure you  discuss any questions you have with your health care provider.   Document Released: 10/18/2005 Document Revised: 01/14/2014 Document Reviewed: 08/16/2013 Elsevier Interactive Patient Education [employer].

## 2015-09-04 ENCOUNTER — Other Ambulatory Visit: Payer: Self-pay | Admitting: *Deleted

## 2015-09-04 LAB — PAP IG (IMAGE GUIDED)

## 2015-09-04 MED ORDER — PRAVASTATIN SODIUM 40 MG PO TABS: 40.0000 mg | ORAL_TABLET | Freq: Every day | ORAL | 3 refills | Status: DC

## 2015-09-05 ENCOUNTER — Telehealth: Payer: Self-pay

## 2015-09-05 NOTE — Telephone Encounter (Signed)
Pt is needing to talk to dr Everlene Farrier about what he was talking about what he wants her today before surgery   Best number [phone]

## 2015-09-06 ENCOUNTER — Other Ambulatory Visit (INDEPENDENT_AMBULATORY_CARE_PROVIDER_SITE_OTHER): Payer: BLUE CROSS/BLUE SHIELD | Admitting: Emergency Medicine

## 2015-09-06 DIAGNOSIS — R3129 Other microscopic hematuria: Secondary | ICD-10-CM

## 2015-09-06 LAB — POCT URINALYSIS DIP (MANUAL ENTRY)
BILIRUBIN UA: NEGATIVE
GLUCOSE UA: NEGATIVE
Ketones, POC UA: NEGATIVE
Leukocytes, UA: NEGATIVE
NITRITE UA: NEGATIVE
Protein Ur, POC: NEGATIVE
Spec Grav, UA: <=1.005
Urobilinogen, UA: 0.2
pH, UA: 6

## 2015-09-06 LAB — POC MICROSCOPIC URINALYSIS (UMFC): Mucus: ABSENT

## 2015-09-06 NOTE — Telephone Encounter (Signed)
Will you please put a call into Dr. Percival Spanish  and ask him to call me on my cell phone Re: Jennifer [name].

## 2015-09-07 ENCOUNTER — Telehealth: Payer: Self-pay | Admitting: Emergency Medicine

## 2015-09-07 ENCOUNTER — Other Ambulatory Visit: Payer: Self-pay | Admitting: Emergency Medicine

## 2015-09-07 DIAGNOSIS — R319 Hematuria, unspecified: Secondary | ICD-10-CM

## 2015-09-07 NOTE — Telephone Encounter (Signed)
Sheketia, did you do this?

## 2015-09-07 NOTE — Telephone Encounter (Signed)
Patient given scheduled appointment with Dr. Alba Cory on 09/15/15 @ 730am New address given-  [address]/ [address]

## 2015-09-12 ENCOUNTER — Encounter: Payer: Self-pay | Admitting: Cardiology

## 2015-09-12 NOTE — Progress Notes (Signed)
Cardiology Office Note   Date:  09/15/2015   ID:  Jennifer [name], DOB 07-10-1950, MRN [id number]  PCP:  Jennifer Reichmann, MD  Cardiologist:   Jennifer Breeding, MD   Chief Complaint  Patient presents with  . Pre-op Exam      History of Present Illness: Jennifer [name] is a 65 y.o. female who presents for preop evaluation prior to [facility].   I last saw the patient in 2013 for evaluation of atypical chest pain.   She had a negative POET (Plain Old Exercise Treadmill).  She is now here preop for hip replacement.  She has a slightly abnormal EKG.  However, she has no cardiac complaints.  She walks with a walker.  The patient denies any new symptoms such as chest discomfort, neck or arm discomfort. There has been no new shortness of breath, PND or orthopnea. There have been no reported palpitations, presyncope or syncope.  She does some mild household chores.     Past Medical History:  Diagnosis Date  . Anxiety   . Depression   . High cholesterol   . Hx of adenomatous polyp of colon    2012 - [place]  . Hyperlipidemia   . Osteoarthritis   . PLMD (periodic limb movement disorder) 04/24/2012  . Seizures ([place])    08/29/13  . Sleep apnea    wears CPAP    Past Surgical History:  Procedure Laterality Date  . APPENDECTOMY    . CESAREAN SECTION    . CHOLECYSTECTOMY  1978  . COLONOSCOPY    . JOINT REPLACEMENT  1998   right knee  . OSTEOTOMY PROXIMAL FEMORAL       Current Outpatient Prescriptions  Medication Sig Dispense Refill  . acetaminophen (TYLENOL) 325 MG tablet Take 650 mg by mouth as needed.    Marland Kitchen aspirin EC 81 MG tablet Take 81 mg by mouth daily.    . Biotin 1000 MCG tablet Take 1,000 mcg by mouth daily.    . cetirizine (ZYRTEC) 10 MG tablet Take 10 mg by mouth daily.    . citalopram (CELEXA) 40 MG tablet TAKE half TABLET BY MOUTH ONCE DAILY  20mg  30 tablet 11  . HYDROcodone-acetaminophen (NORCO/VICODIN) 5-325 MG tablet   0  . ibuprofen (ADVIL,MOTRIN) 200 MG tablet  Take 200 mg by mouth every 6 (six) hours as needed.    . levETIRAcetam (KEPPRA) 500 MG tablet Take 1 tablet (500 mg total) by mouth 2 (two) times daily. 180 tablet 3  . losartan (COZAAR) 50 MG tablet Take 1 tablet (50 mg total) by mouth daily. 90 tablet 3  . MELATONIN PO Take 5 mg by mouth daily.    . Multiple Vitamin (MULTIVITAMIN WITH MINERALS) TABS tablet Take 1 tablet by mouth daily.    . pravastatin (PRAVACHOL) 40 MG tablet Take 1 tablet (40 mg total) by mouth daily. 90 tablet 3  . traMADol (ULTRAM) 50 MG tablet Take by mouth 1 day or 1 dose.     No current facility-administered medications for this visit.     Allergies:   Crestor [rosuvastatin]; Crestor [rosuvastatin]; Neomycin-bacitracin zn-polymyx; Neomycin-bacitracin-polymyxin [bacitracin-neomycin-polymyxin]; Codeine; Codeine; Feldene [piroxicam]; Penicillins; Penicillins; Piroxicam; Sulfa antibiotics; and Sulfonamide derivatives    Social History:  The patient  reports that she has never smoked. She has never used smokeless tobacco. She reports that she does not drink alcohol or use drugs.   Family History:  The patient's family history includes Atrial fibrillation in her brother; Cancer (age  of onset: 101) in her father; Esophageal cancer in her father; Heart disease (age of onset: 36) in her mother; Sleep apnea in her brother, sister, and sister; Stomach cancer in her maternal grandmother.    ROS:  Please see the history of present illness.   Otherwise, review of systems are positive for none.   All other systems are reviewed and negative.    PHYSICAL EXAM: VS:  BP (!) 152/72   Pulse 85   Ht 5\' 4"  (1.626 m)   Wt 193 lb 3.2 oz (87.6 kg)   SpO2 96%   BMI 33.16 kg/m  , BMI Body mass index is 33.16 kg/m. GENERAL:  Well appearing HEENT:  Pupils equal round and reactive, fundi not visualized, oral mucosa unremarkable NECK:  No jugular venous distention, waveform within normal limits, carotid upstroke brisk and symmetric, no  bruits, no thyromegaly LYMPHATICS:  No cervical, inguinal adenopathy LUNGS:  Clear to auscultation bilaterally BACK:  No CVA tenderness CHEST:  Unremarkable HEART:  PMI not displaced or sustained,S1 and S2 within normal limits, no S3, no S4, no clicks, no rubs, no murmurs ABD:  Flat, positive bowel sounds normal in frequency in pitch, no bruits, no rebound, no guarding, no midline pulsatile mass, no hepatomegaly, no splenomegaly EXT:  2 plus pulses throughout, no edema, no cyanosis no clubbing   EKG:  EKG is ordered today. The ekg ordered 8/26/17demonstrates sinus rhythm, rate 59, axis within normal limits, short PR interval, no acute ST-T wave changes.   Recent Labs: 09/02/2015: ALT 13; BUN 22; Creat 0.72; Hemoglobin 12.0; Potassium 4.3; Sodium 136    Lipid Panel    Component Value Date/Time   CHOL 322 (H) 09/02/2015 1207   TRIG 216 (H) 09/02/2015 1207   HDL 60 09/02/2015 1207   CHOLHDL 5.4 (H) 09/02/2015 1207   VLDL 43 (H) 09/02/2015 1207   LDLCALC 219 (H) 09/02/2015 1207      Wt Readings from Last 3 Encounters:  09/15/15 193 lb 3.2 oz (87.6 kg)  09/13/15 197 lb 7 oz (89.6 kg)  09/02/15 192 lb 4 oz (87.2 kg)      Other studies Reviewed: Additional studies/ records that were reviewed today include: POET (Plain Old Exercise Treadmill) .2013. Review of the above records demonstrates:  Please see elsewhere in the note.     ASSESSMENT AND PLAN:  PREOP:  The patient has no high-risk findings or symptoms. She is not going for a high risk surgical procedure. She had a negative stress test in 2013. Therefore, based on ACC/AHA guidelines, the patient would be at acceptable risk for the planned procedure without further cardiovascular testing.  HTN:  This is slightly elevated. However, she just was started on Cozaar. This can be dose adjusted by  DAUB, STEVE A, MD.  She also understands that as she gets her hip fixed and becomes more active weight loss will help with blood  pressure control.     Current medicines are reviewed at length with the patient today.  The patient does not have concerns regarding medicines.  The following changes have been made:  no change  Labs/ tests ordered today include: None No orders of the defined types were placed in this encounter.    Disposition:   FU with me as needed.      Signed, Jennifer Breeding, MD  09/15/2015 7:37 AM    [facility]

## 2015-09-13 ENCOUNTER — Ambulatory Visit (INDEPENDENT_AMBULATORY_CARE_PROVIDER_SITE_OTHER): Payer: BLUE CROSS/BLUE SHIELD | Admitting: Neurology

## 2015-09-13 ENCOUNTER — Encounter: Payer: Self-pay | Admitting: Cardiology

## 2015-09-13 VITALS — BP 156/82 | HR 81 | Temp 97.9°F | Ht 64.0 in | Wt 197.4 lb

## 2015-09-13 DIAGNOSIS — R569 Unspecified convulsions: Secondary | ICD-10-CM | POA: Diagnosis not present

## 2015-09-13 DIAGNOSIS — G40009 Localization-related (focal) (partial) idiopathic epilepsy and epileptic syndromes with seizures of localized onset, not intractable, without status epilepticus: Secondary | ICD-10-CM | POA: Diagnosis not present

## 2015-09-13 MED ORDER — LEVETIRACETAM 500 MG PO TABS: 500.0000 mg | ORAL_TABLET | Freq: Two times a day (BID) | ORAL | 3 refills | Status: DC
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
## 2015-09-15 ENCOUNTER — Ambulatory Visit (INDEPENDENT_AMBULATORY_CARE_PROVIDER_SITE_OTHER): Payer: BLUE CROSS/BLUE SHIELD | Admitting: Cardiology

## 2015-09-15 ENCOUNTER — Inpatient Hospital Stay (HOSPITAL_COMMUNITY): Admission: RE | Admit: 2015-09-15 | Payer: Self-pay | Source: Ambulatory Visit

## 2015-09-15 ENCOUNTER — Encounter: Payer: Self-pay | Admitting: Cardiology

## 2015-09-15 VITALS — BP 152/72 | HR 85 | Ht 64.0 in | Wt 193.2 lb

## 2015-09-15 DIAGNOSIS — Z01810 Encounter for preprocedural cardiovascular examination: Secondary | ICD-10-CM

## 2015-09-15 DIAGNOSIS — I10 Essential (primary) hypertension: Secondary | ICD-10-CM

## 2015-09-15 DIAGNOSIS — R9431 Abnormal electrocardiogram [ECG] [EKG]: Secondary | ICD-10-CM

## 2015-09-15 NOTE — Patient Instructions (Signed)
Medication Instructions:  Continue current medications  Labwork: None Ordered  Testing/Procedures: None Ordered  Follow-Up: Your physician recommends that you schedule a follow-up appointment in: As Needed   Any Other Special Instructions Will Be Listed Below (If Applicable).   If you need a refill on your cardiac medications before your next appointment, please call your pharmacy.   

## 2015-10-02 ENCOUNTER — Ambulatory Visit
Admission: RE | Admit: 2015-10-02 | Discharge: 2015-10-02 | Disposition: A | Discharge status: Home / Self Care | Payer: BLUE CROSS/BLUE SHIELD | Source: Ambulatory Visit | Attending: Emergency Medicine | Admitting: Emergency Medicine

## 2015-10-02 DIAGNOSIS — R319 Hematuria, unspecified: Secondary | ICD-10-CM

## 2015-10-03 ENCOUNTER — Telehealth: Payer: Self-pay | Admitting: Emergency Medicine

## 2015-10-03 NOTE — Telephone Encounter (Signed)
-----   Message from Darlyne Russian, MD sent at 10/03/2015  7:35 AM EDT ----- Call us normal. Follow up with Urologist.

## 2015-10-04 ENCOUNTER — Other Ambulatory Visit: Payer: Self-pay | Admitting: Orthopedic Surgery

## 2015-10-09 ENCOUNTER — Ambulatory Visit (HOSPITAL_COMMUNITY)
Admission: RE | Admit: 2015-10-09 | Discharge: 2015-10-09 | Disposition: A | Discharge status: Home / Self Care | Payer: BLUE CROSS/BLUE SHIELD | Source: Ambulatory Visit | Attending: Orthopedic Surgery | Admitting: Orthopedic Surgery

## 2015-10-09 ENCOUNTER — Encounter (HOSPITAL_COMMUNITY)
Admission: RE | Admit: 2015-10-09 | Discharge: 2015-10-09 | Disposition: A | Discharge status: Home / Self Care | Payer: BLUE CROSS/BLUE SHIELD | Source: Ambulatory Visit | Attending: Orthopedic Surgery | Admitting: Orthopedic Surgery

## 2015-10-09 ENCOUNTER — Encounter (HOSPITAL_COMMUNITY): Payer: Self-pay

## 2015-10-09 DIAGNOSIS — Z01818 Encounter for other preprocedural examination: Secondary | ICD-10-CM | POA: Diagnosis present

## 2015-10-09 DIAGNOSIS — Z01812 Encounter for preprocedural laboratory examination: Secondary | ICD-10-CM | POA: Diagnosis present

## 2015-10-09 HISTORY — DX: Cardiac arrhythmia, unspecified: I49.9

## 2015-10-09 HISTORY — DX: Essential (primary) hypertension: I10

## 2015-10-09 LAB — TYPE AND SCREEN
ABO/RH(D): A POS
Antibody Screen: NEGATIVE

## 2015-10-09 LAB — CBC WITH DIFFERENTIAL/PLATELET
Basophils Absolute: 0 10*3/uL (ref 0.0–0.1)
Basophils Relative: 1 %
Eosinophils Absolute: 0.1 10*3/uL (ref 0.0–0.7)
Eosinophils Relative: 2 %
HEMATOCRIT: 38.9 % (ref 36.0–46.0)
Hemoglobin: 12.8 g/dL (ref 12.0–15.0)
LYMPHS PCT: 20 %
Lymphs Abs: 1.2 10*3/uL (ref 0.7–4.0)
MCH: 29.8 pg (ref 26.0–34.0)
MCHC: 32.9 g/dL (ref 30.0–36.0)
MCV: 90.7 fL (ref 78.0–100.0)
MONO ABS: 0.5 10*3/uL (ref 0.1–1.0)
MONOS PCT: 8 %
NEUTROS ABS: 4.1 10*3/uL (ref 1.7–7.7)
Neutrophils Relative %: 71 %
Platelets: 296 10*3/uL (ref 150–400)
RBC: 4.29 MIL/uL (ref 3.87–5.11)
RDW: 12.7 % (ref 11.5–15.5)
WBC: 5.9 10*3/uL (ref 4.0–10.5)

## 2015-10-09 LAB — BASIC METABOLIC PANEL
ANION GAP: 9 (ref 5–15)
BUN: 8 mg/dL (ref 6–20)
CALCIUM: 9.8 mg/dL (ref 8.9–10.3)
CO2: 26 mmol/L (ref 22–32)
Chloride: 102 mmol/L (ref 101–111)
Creatinine, Ser: 0.68 mg/dL (ref 0.44–1.00)
GFR calc Af Amer: >60 mL/min (ref >60)
GFR calc non Af Amer: >60 mL/min (ref >60)
GLUCOSE: 87 mg/dL (ref 65–99)
POTASSIUM: 3.9 mmol/L (ref 3.5–5.1)
Sodium: 137 mmol/L (ref 135–145)

## 2015-10-09 LAB — URINALYSIS, ROUTINE W REFLEX MICROSCOPIC
Bilirubin Urine: NEGATIVE
Glucose, UA: NEGATIVE mg/dL
Ketones, ur: NEGATIVE mg/dL
NITRITE: NEGATIVE
PROTEIN: NEGATIVE mg/dL
Specific Gravity, Urine: 1.01 (ref 1.005–1.030)
pH: 7 (ref 5.0–8.0)

## 2015-10-09 LAB — URINE MICROSCOPIC-ADD ON: BACTERIA UA: NONE SEEN

## 2015-10-09 LAB — SURGICAL PCR SCREEN
MRSA, PCR: NEGATIVE
Staphylococcus aureus: NEGATIVE

## 2015-10-09 LAB — PROTIME-INR
INR: 0.99
Prothrombin Time: 13.1 seconds (ref 11.4–15.2)

## 2015-10-09 LAB — ABO/RH: ABO/RH(D): A POS

## 2015-10-09 LAB — APTT: aPTT: 34 seconds (ref 24–36)

## 2015-10-09 NOTE — Progress Notes (Addendum)
Cardio is Dr. Percival Spanish,  Cassell Clement 09/2015. Clearance note in Epic. Initially was sent to him for having "funny little beat" on EKG at PCP's office.  Denies any chest pain, sob, and doesn't 'feel' the FLB.  EKG just performed in 08/2015 ([facility])  PCP is Dr. Mertie Moores  07/2015 Neurologist is Dr. Delice Lesch  LOV 07/2015. Had Sleep study done approx 3-4 yrs ago.  Dr. Maureen Chatters 07/2015.  Was done at [facility]  Uses CPAP.

## 2015-10-09 NOTE — Pre-Procedure Instructions (Signed)
    Jennifer [name]  10/09/2015      [facility] [address], [place] [address] Phone: [phone] Fax: [phone]    Your procedure is scheduled on Friday, Oct. 13th   Report to [facility] Admitting at 7:30 A.M.   Call this number if you have problems the morning of surgery:  [phone]   Remember:  Do not eat food or drink liquids after midnight Thursday.  Take these medicines the morning of surgery with A SIP OF WATER :Zyrtec, Celexa, Norco   Do not wear jewelry, make-up or nail polish.  Do not wear lotions, powders, or perfumes, or deoderant.   Do not shave underarms & legs 48 hours prior to surgery.    Do not bring valuables to the hospital.  [facility] is not responsible for any belongings or valuables.  Contacts, dentures or bridgework may not be worn into surgery.  Leave your suitcase in the car.  After surgery it may be brought to your room. For patients admitted to the hospital, discharge time will be determined by your treatment team.  Name and phone number of your driver:     Please read over the following fact sheets that you were given. Pain Booklet, MRSA Information and Surgical Site Infection Prevention

## 2015-10-10 NOTE — Progress Notes (Signed)
Anesthesia Chart Review:  Pt is a 65 year old female scheduled for R total hip arthroplasty anterior approach on 10/20/2015 with Frederik Pear, MD.   - PCP is [name], MD  PMH includes:  HTN, PACs, hyperlipidemia, OSA, seizures. Never smoker. BMI 33.5  Medications include: ASA, keppra, losartan, pravastatin  Preoperative labs reviewed.    CXR 10/09/15: Mild left base subsegmental atelectasis and/or scarring.  EKG 09/02/15: Sinus  Bradycardia (59 bpm). Short PR syndrome   Exercise treadmill test 07/25/11: Negative adequate ETT.  No further testing is indicated.   Pt saw Minus Breeding, MD with cardiology on 09/15/15 for pre-op eval and was cleared for surgery.   If no changes, I anticipate pt can proceed with surgery as scheduled.   Willeen Cass, FNP-BC [facility] Short Stay Surgical Center/Anesthesiology Phone: [phone] 10/10/2015 3:19 PM

## 2015-10-12 ENCOUNTER — Encounter: Payer: Self-pay | Admitting: Neurology

## 2015-10-17 NOTE — H&P (Signed)
TOTAL HIP ADMISSION H&P  Patient is admitted for right total hip arthroplasty.  Subjective:  Chief Complaint: right hip pain  HPI: Jennifer [name], 65 y.o. female, has a history of pain and functional disability in the right hip(s) due to arthritis and patient has failed non-surgical conservative treatments for greater than 12 weeks to include NSAID's and/or analgesics, corticosteriod injections, flexibility and strengthening excercises, use of assistive devices, weight reduction as appropriate and activity modification.  Onset of symptoms was gradual starting 1 years ago with rapidlly worsening course since that time.The patient noted no past surgery on the right hip(s).  Patient currently rates pain in the right hip at 10 out of 10 with activity. Patient has night pain, worsening of pain with activity and weight bearing, trendelenberg gait, pain that interfers with activities of daily living and pain with passive range of motion. Patient has evidence of subchondral cysts, subchondral sclerosis, periarticular osteophytes and joint space narrowing by imaging studies. This condition presents safety issues increasing the risk of falls.   There is no current active infection.  Patient Active Problem List   Diagnosis Date Noted  . Localization-related idiopathic epilepsy and epileptic syndromes with seizures of localized onset, not intractable, without status epilepticus ([place]) 09/09/2014  . Depression 07/19/2014  . Hx of adenomatous polyp of colon 03/29/2014  . Seizure after head injury ([place]) 08/29/2013  . OSA (obstructive sleep apnea) 04/24/2012  . PLMD (periodic limb movement disorder) 04/24/2012  . Fibroids, submucosal 12/18/2010  . HYPERLIPIDEMIA 07/04/2008  . ESSENTIAL HYPERTENSION, BENIGN 07/04/2008  . BAKER'S CYST, LEFT KNEE 07/04/2008  . Shortened PR interval 07/04/2008   Past Medical History:  Diagnosis Date  . Anxiety   . Depression   . Dysrhythmia    FLB - ? PAC'S COMES AND  GOES  . High cholesterol   . Hx of adenomatous polyp of colon    2012 - [place]  . Hyperlipidemia   . Hypertension   . Osteoarthritis   . PLMD (periodic limb movement disorder) 04/24/2012  . Seizures ([place])    08/29/13  . Sleep apnea    wears CPAP    Past Surgical History:  Procedure Laterality Date  . APPENDECTOMY    . CESAREAN SECTION    . CHOLECYSTECTOMY  1978  . COLONOSCOPY    . DILATION AND CURETTAGE OF UTERUS    . JOINT REPLACEMENT  1998   right knee  . OSTEOTOMY PROXIMAL FEMORAL      No prescriptions prior to admission.   Allergies  Allergen Reactions  . Crestor [Rosuvastatin] Other (See Comments)    Muscle aches  . Neomycin-Bacitracin Zn-Polymyx Swelling    Ophthalmic only(eye swelling)  . Neomycin-Bacitracin-Polymyxin [Bacitracin-Neomycin-Polymyxin] Swelling    Reaction to eye drops  . Codeine Rash  . Codeine Rash  . Feldene [Piroxicam] Rash  . Penicillins Rash  . Penicillins Rash    Has patient had a PCN reaction causing immediate rash, facial/tongue/throat swelling, SOB or lightheadedness with hypotension:Yes Has patient had a PCN reaction causing severe rash involving mucus membranes or skin necrosis: No Has patient had a PCN reaction that required hospitalization:No Has patient had a PCN reaction occurring within the last 10 years:No If all of the above answers are "NO", then may proceed with Cephalosporin use.   . Piroxicam Rash  . Sulfa Antibiotics Rash  . Sulfonamide Derivatives Rash    Social History  Substance Use Topics  . Smoking status: Never Smoker  . Smokeless tobacco: Never Used  . Alcohol  use No    Family History  Problem Relation Age of Onset  . Cancer Father 69  . Esophageal cancer Father   . Heart disease Mother 9    Aortic valve disease  . Atrial fibrillation Brother   . Sleep apnea Brother   . Sleep apnea Sister   . Sleep apnea Sister   . Stomach cancer Maternal Grandmother   . Colon cancer Neg Hx   . Rectal cancer Neg Hx       Review of Systems  Constitutional: Negative.   HENT: Negative.   Eyes: Negative.   Respiratory:       Sleep apnea  Cardiovascular: Negative.   Gastrointestinal: Negative.   Genitourinary: Negative.   Musculoskeletal: Positive for joint pain and myalgias.  Skin: Positive for rash.  Neurological:       Seizures  Endo/Heme/Allergies: Negative.   Psychiatric/Behavioral: Positive for depression.    Objective:  Physical Exam  Constitutional: She is oriented to person, place, and time. She appears well-developed and well-nourished.  HENT:  Head: Normocephalic and atraumatic.  Eyes: Pupils are equal, round, and reactive to light.  Neck: Normal range of motion. Neck supple.  Cardiovascular: Intact distal pulses.   Respiratory: Effort normal.  Musculoskeletal: She exhibits tenderness.  Examination of the right hip reveals she has limited hip flexion.  Positive impingement sign.  She has 4-/5 strength with resistant right hip flexion.  She has slight antalgic gait favoring the right side.  She's neurovascularly intact otherwise.  Neurological: She is alert and oriented to person, place, and time.  Skin: Skin is warm and dry.  Psychiatric: She has a normal mood and affect. Her behavior is normal. Judgment and thought content normal.    Vital signs in last 24 hours:    Labs:   Estimated body mass index is 33.44 kg/m as calculated from the following:   Height as of 10/09/15: 5\' 4"  (1.626 m).   Weight as of 10/09/15: 88.4 kg (194 lb 12.8 oz).   Imaging Review Plain radiographs demonstrate end-stage DJD of the right hip with moderate subchondral cystic formation.     Assessment/Plan:  End stage arthritis, right hip(s)  The patient history, physical examination, clinical judgement of the provider and imaging studies are consistent with end stage degenerative joint disease of the right hip(s) and total hip arthroplasty is deemed medically necessary. The treatment options  including medical management, injection therapy, arthroscopy and arthroplasty were discussed at length. The risks and benefits of total hip arthroplasty were presented and reviewed. The risks due to aseptic loosening, infection, stiffness, dislocation/subluxation,  thromboembolic complications and other imponderables were discussed.  The patient acknowledged the explanation, agreed to proceed with the plan and consent was signed. Patient is being admitted for inpatient treatment for surgery, pain control, PT, OT, prophylactic antibiotics, VTE prophylaxis, progressive ambulation and ADL's and discharge planning.The patient is planning to be discharged home with home health services

## 2015-10-20 ENCOUNTER — Inpatient Hospital Stay (HOSPITAL_COMMUNITY): Payer: BLUE CROSS/BLUE SHIELD | Admitting: Emergency Medicine

## 2015-10-20 ENCOUNTER — Encounter (HOSPITAL_COMMUNITY)
Admission: RE | Disposition: A | Discharge status: Home / Self Care | Payer: Self-pay | Source: Ambulatory Visit | Attending: Orthopedic Surgery

## 2015-10-20 ENCOUNTER — Inpatient Hospital Stay (HOSPITAL_COMMUNITY): Payer: BLUE CROSS/BLUE SHIELD | Admitting: Certified Registered Nurse Anesthetist

## 2015-10-20 ENCOUNTER — Inpatient Hospital Stay (HOSPITAL_COMMUNITY)
Admission: RE | Admit: 2015-10-20 | Discharge: 2015-10-22 | DRG: 470 | Disposition: A | Discharge status: Home / Self Care | Payer: BLUE CROSS/BLUE SHIELD | Source: Ambulatory Visit | Attending: Orthopedic Surgery | Admitting: Orthopedic Surgery

## 2015-10-20 ENCOUNTER — Inpatient Hospital Stay (HOSPITAL_COMMUNITY): Payer: BLUE CROSS/BLUE SHIELD

## 2015-10-20 ENCOUNTER — Encounter (HOSPITAL_COMMUNITY): Payer: Self-pay | Admitting: Certified Registered Nurse Anesthetist

## 2015-10-20 DIAGNOSIS — E785 Hyperlipidemia, unspecified: Secondary | ICD-10-CM | POA: Diagnosis present

## 2015-10-20 DIAGNOSIS — Z888 Allergy status to other drugs, medicaments and biological substances status: Secondary | ICD-10-CM | POA: Diagnosis not present

## 2015-10-20 DIAGNOSIS — Z86010 Personal history of colonic polyps: Secondary | ICD-10-CM | POA: Diagnosis not present

## 2015-10-20 DIAGNOSIS — G40009 Localization-related (focal) (partial) idiopathic epilepsy and epileptic syndromes with seizures of localized onset, not intractable, without status epilepticus: Secondary | ICD-10-CM | POA: Diagnosis present

## 2015-10-20 DIAGNOSIS — Z880 Allergy status to penicillin: Secondary | ICD-10-CM | POA: Diagnosis not present

## 2015-10-20 DIAGNOSIS — I10 Essential (primary) hypertension: Secondary | ICD-10-CM | POA: Diagnosis present

## 2015-10-20 DIAGNOSIS — M1611 Unilateral primary osteoarthritis, right hip: Secondary | ICD-10-CM | POA: Diagnosis present

## 2015-10-20 DIAGNOSIS — Z9049 Acquired absence of other specified parts of digestive tract: Secondary | ICD-10-CM | POA: Diagnosis not present

## 2015-10-20 DIAGNOSIS — Z96651 Presence of right artificial knee joint: Secondary | ICD-10-CM | POA: Diagnosis present

## 2015-10-20 DIAGNOSIS — D62 Acute posthemorrhagic anemia: Secondary | ICD-10-CM | POA: Diagnosis not present

## 2015-10-20 DIAGNOSIS — E7800 Pure hypercholesterolemia, unspecified: Secondary | ICD-10-CM | POA: Diagnosis present

## 2015-10-20 DIAGNOSIS — Z882 Allergy status to sulfonamides status: Secondary | ICD-10-CM | POA: Diagnosis not present

## 2015-10-20 DIAGNOSIS — Z419 Encounter for procedure for purposes other than remedying health state, unspecified: Secondary | ICD-10-CM

## 2015-10-20 DIAGNOSIS — G4733 Obstructive sleep apnea (adult) (pediatric): Secondary | ICD-10-CM | POA: Diagnosis present

## 2015-10-20 DIAGNOSIS — F329 Major depressive disorder, single episode, unspecified: Secondary | ICD-10-CM | POA: Diagnosis present

## 2015-10-20 DIAGNOSIS — R2681 Unsteadiness on feet: Secondary | ICD-10-CM

## 2015-10-20 HISTORY — PX: TOTAL HIP ARTHROPLASTY: SHX124

## 2015-10-20 SURGERY — ARTHROPLASTY, HIP, TOTAL, ANTERIOR APPROACH
Anesthesia: Spinal | Laterality: Right

## 2015-10-20 MED ORDER — PHENOL 1.4 % MT LIQD: 1.0000 | OROMUCOSAL | Status: DC | PRN

## 2015-10-20 MED ORDER — FENTANYL CITRATE (PF) 100 MCG/2ML IJ SOLN
INTRAMUSCULAR | Status: DC | PRN
  Administered 2015-10-20: 50 ug via INTRAVENOUS

## 2015-10-20 MED ORDER — PHENYLEPHRINE HCL 10 MG/ML IJ SOLN
INTRAVENOUS | Status: DC | PRN
  Administered 2015-10-20: 20 ug/min via INTRAVENOUS

## 2015-10-20 MED ORDER — ONDANSETRON HCL 4 MG/2ML IJ SOLN
INTRAMUSCULAR | Status: AC
  Filled 2015-10-20: qty 2

## 2015-10-20 MED ORDER — PROMETHAZINE HCL 25 MG/ML IJ SOLN: 6.2500 mg | INTRAMUSCULAR | Status: DC | PRN

## 2015-10-20 MED ORDER — BUPIVACAINE-EPINEPHRINE 0.25% -1:200000 IJ SOLN
INTRAMUSCULAR | Status: AC
  Filled 2015-10-20: qty 1

## 2015-10-20 MED ORDER — ONDANSETRON HCL 4 MG PO TABS: 4.0000 mg | ORAL_TABLET | Freq: Four times a day (QID) | ORAL | Status: DC | PRN

## 2015-10-20 MED ORDER — LACTATED RINGERS IV SOLN
INTRAVENOUS | Status: DC
  Administered 2015-10-20 (×2): via INTRAVENOUS

## 2015-10-20 MED ORDER — PROPOFOL 10 MG/ML IV BOLUS
INTRAVENOUS | Status: AC
  Filled 2015-10-20: qty 20

## 2015-10-20 MED ORDER — BIOTIN 1000 MCG PO TABS: 1000.0000 ug | ORAL_TABLET | Freq: Every evening | ORAL | Status: DC

## 2015-10-20 MED ORDER — DOCUSATE SODIUM 100 MG PO CAPS
100.0000 mg | ORAL_CAPSULE | Freq: Two times a day (BID) | ORAL | Status: DC
  Administered 2015-10-20 – 2015-10-22 (×4): 100 mg via ORAL
  Filled 2015-10-20 (×4): qty 1

## 2015-10-20 MED ORDER — LORATADINE 10 MG PO TABS
10.0000 mg | ORAL_TABLET | Freq: Every day | ORAL | Status: DC
  Administered 2015-10-21 – 2015-10-22 (×2): 10 mg via ORAL
  Filled 2015-10-20 (×2): qty 1

## 2015-10-20 MED ORDER — DEXAMETHASONE SODIUM PHOSPHATE 10 MG/ML IJ SOLN
10.0000 mg | Freq: Once | INTRAMUSCULAR | Status: AC
  Administered 2015-10-21: 10 mg via INTRAVENOUS
  Filled 2015-10-20: qty 1

## 2015-10-20 MED ORDER — METHOCARBAMOL 500 MG PO TABS
500.0000 mg | ORAL_TABLET | Freq: Four times a day (QID) | ORAL | Status: DC | PRN
  Administered 2015-10-20 – 2015-10-21 (×3): 500 mg via ORAL
  Filled 2015-10-20 (×4): qty 1

## 2015-10-20 MED ORDER — DIPHENHYDRAMINE HCL 12.5 MG/5ML PO ELIX: 12.5000 mg | ORAL_SOLUTION | ORAL | Status: DC | PRN

## 2015-10-20 MED ORDER — LEVETIRACETAM 500 MG PO TABS
500.0000 mg | ORAL_TABLET | Freq: Two times a day (BID) | ORAL | Status: DC
  Administered 2015-10-20 – 2015-10-22 (×4): 500 mg via ORAL
  Filled 2015-10-20 (×4): qty 1

## 2015-10-20 MED ORDER — HYDROMORPHONE HCL 1 MG/ML IJ SOLN
1.0000 mg | INTRAMUSCULAR | Status: DC | PRN
  Administered 2015-10-20 (×2): 1 mg via INTRAVENOUS
  Filled 2015-10-20 (×2): qty 1

## 2015-10-20 MED ORDER — SENNOSIDES-DOCUSATE SODIUM 8.6-50 MG PO TABS: 1.0000 | ORAL_TABLET | Freq: Every evening | ORAL | Status: DC | PRN

## 2015-10-20 MED ORDER — METHOCARBAMOL 500 MG PO TABS: 500.0000 mg | ORAL_TABLET | Freq: Two times a day (BID) | ORAL | 0 refills | Status: DC

## 2015-10-20 MED ORDER — FENTANYL CITRATE (PF) 100 MCG/2ML IJ SOLN
INTRAMUSCULAR | Status: AC
  Filled 2015-10-20: qty 2

## 2015-10-20 MED ORDER — ONDANSETRON HCL 4 MG/2ML IJ SOLN
4.0000 mg | Freq: Four times a day (QID) | INTRAMUSCULAR | Status: DC | PRN
  Administered 2015-10-20: 4 mg via INTRAVENOUS
  Filled 2015-10-20: qty 2

## 2015-10-20 MED ORDER — VANCOMYCIN HCL IN DEXTROSE 1-5 GM/200ML-% IV SOLN
INTRAVENOUS | Status: AC
  Filled 2015-10-20: qty 200

## 2015-10-20 MED ORDER — BISACODYL 5 MG PO TBEC: 5.0000 mg | DELAYED_RELEASE_TABLET | Freq: Every day | ORAL | Status: DC | PRN

## 2015-10-20 MED ORDER — OXYCODONE HCL 5 MG PO TABS
5.0000 mg | ORAL_TABLET | ORAL | Status: DC | PRN
  Administered 2015-10-20: 5 mg via ORAL
  Administered 2015-10-20: 10 mg via ORAL
  Administered 2015-10-21 (×2): 5 mg via ORAL
  Filled 2015-10-20: qty 2
  Filled 2015-10-20 (×4): qty 1

## 2015-10-20 MED ORDER — METHOCARBAMOL 1000 MG/10ML IJ SOLN: 500.0000 mg | Freq: Four times a day (QID) | INTRAVENOUS | Status: DC | PRN

## 2015-10-20 MED ORDER — MIDAZOLAM HCL 2 MG/2ML IJ SOLN
INTRAMUSCULAR | Status: AC
  Filled 2015-10-20: qty 2

## 2015-10-20 MED ORDER — MENTHOL 3 MG MT LOZG: 1.0000 | LOZENGE | OROMUCOSAL | Status: DC | PRN

## 2015-10-20 MED ORDER — HYDROMORPHONE HCL 1 MG/ML IJ SOLN: 0.2500 mg | INTRAMUSCULAR | Status: DC | PRN

## 2015-10-20 MED ORDER — OXYCODONE-ACETAMINOPHEN 5-325 MG PO TABS: 1.0000 | ORAL_TABLET | ORAL | 0 refills | Status: DC | PRN

## 2015-10-20 MED ORDER — BUPIVACAINE LIPOSOME 1.3 % IJ SUSP
20.0000 mL | Freq: Once | INTRAMUSCULAR | Status: AC
  Administered 2015-10-20: 20 mL
  Filled 2015-10-20: qty 20

## 2015-10-20 MED ORDER — FLEET ENEMA 7-19 GM/118ML RE ENEM: 1.0000 | ENEMA | Freq: Once | RECTAL | Status: DC | PRN

## 2015-10-20 MED ORDER — METOCLOPRAMIDE HCL 5 MG PO TABS: 5.0000 mg | ORAL_TABLET | Freq: Three times a day (TID) | ORAL | Status: DC | PRN

## 2015-10-20 MED ORDER — METOCLOPRAMIDE HCL 5 MG/ML IJ SOLN: 5.0000 mg | Freq: Three times a day (TID) | INTRAMUSCULAR | Status: DC | PRN

## 2015-10-20 MED ORDER — KCL IN DEXTROSE-NACL 20-5-0.45 MEQ/L-%-% IV SOLN
INTRAVENOUS | Status: DC
  Administered 2015-10-20 (×3): via INTRAVENOUS
  Filled 2015-10-20 (×2): qty 1000

## 2015-10-20 MED ORDER — ASPIRIN EC 325 MG PO TBEC
325.0000 mg | DELAYED_RELEASE_TABLET | Freq: Every day | ORAL | Status: DC
  Administered 2015-10-21 – 2015-10-22 (×2): 325 mg via ORAL
  Filled 2015-10-20 (×2): qty 1

## 2015-10-20 MED ORDER — 0.9 % SODIUM CHLORIDE (POUR BTL) OPTIME
TOPICAL | Status: DC | PRN
  Administered 2015-10-20: 1000 mL

## 2015-10-20 MED ORDER — BUPIVACAINE-EPINEPHRINE 0.25% -1:200000 IJ SOLN
INTRAMUSCULAR | Status: DC | PRN
  Administered 2015-10-20: 50 mL

## 2015-10-20 MED ORDER — MIDAZOLAM HCL 5 MG/5ML IJ SOLN
INTRAMUSCULAR | Status: DC | PRN
  Administered 2015-10-20 (×2): 1 mg via INTRAVENOUS

## 2015-10-20 MED ORDER — ACETAMINOPHEN 325 MG PO TABS
650.0000 mg | ORAL_TABLET | Freq: Four times a day (QID) | ORAL | Status: DC | PRN
  Administered 2015-10-21: 650 mg via ORAL
  Filled 2015-10-20: qty 2

## 2015-10-20 MED ORDER — CHLORHEXIDINE GLUCONATE 4 % EX LIQD: 60.0000 mL | Freq: Once | CUTANEOUS | Status: DC

## 2015-10-20 MED ORDER — KCL IN DEXTROSE-NACL 20-5-0.45 MEQ/L-%-% IV SOLN
INTRAVENOUS | Status: AC
  Filled 2015-10-20: qty 1000

## 2015-10-20 MED ORDER — BUPIVACAINE IN DEXTROSE 0.75-8.25 % IT SOLN
INTRATHECAL | Status: DC | PRN
  Administered 2015-10-20: 2 mL via INTRATHECAL

## 2015-10-20 MED ORDER — CITALOPRAM HYDROBROMIDE 20 MG PO TABS
20.0000 mg | ORAL_TABLET | Freq: Every day | ORAL | Status: DC
  Administered 2015-10-21 – 2015-10-22 (×2): 20 mg via ORAL
  Filled 2015-10-20 (×2): qty 1

## 2015-10-20 MED ORDER — TRANEXAMIC ACID 1000 MG/10ML IV SOLN
1000.0000 mg | INTRAVENOUS | Status: AC
  Administered 2015-10-20: 1000 mg via INTRAVENOUS
  Filled 2015-10-20: qty 10

## 2015-10-20 MED ORDER — PROPOFOL 500 MG/50ML IV EMUL
INTRAVENOUS | Status: DC | PRN
  Administered 2015-10-20: 30 ug/kg/min via INTRAVENOUS

## 2015-10-20 MED ORDER — ASPIRIN EC 325 MG PO TBEC: 325.0000 mg | DELAYED_RELEASE_TABLET | Freq: Two times a day (BID) | ORAL | 0 refills | Status: DC

## 2015-10-20 MED ORDER — LOSARTAN POTASSIUM 50 MG PO TABS
50.0000 mg | ORAL_TABLET | Freq: Every day | ORAL | Status: DC
  Administered 2015-10-20 – 2015-10-22 (×3): 50 mg via ORAL
  Filled 2015-10-20 (×3): qty 1

## 2015-10-20 MED ORDER — DEXTROSE-NACL 5-0.45 % IV SOLN: INTRAVENOUS | Status: DC

## 2015-10-20 MED ORDER — MELATONIN 3 MG PO TABS
6.0000 mg | ORAL_TABLET | Freq: Every day | ORAL | Status: DC
  Administered 2015-10-20 – 2015-10-21 (×2): 6 mg via ORAL
  Filled 2015-10-20 (×2): qty 2

## 2015-10-20 MED ORDER — ACETAMINOPHEN 650 MG RE SUPP: 650.0000 mg | Freq: Four times a day (QID) | RECTAL | Status: DC | PRN

## 2015-10-20 MED ORDER — VANCOMYCIN HCL IN DEXTROSE 1-5 GM/200ML-% IV SOLN
1000.0000 mg | INTRAVENOUS | Status: AC
  Administered 2015-10-20: 1000 mg via INTRAVENOUS

## 2015-10-20 MED ORDER — ALUM & MAG HYDROXIDE-SIMETH 200-200-20 MG/5ML PO SUSP: 30.0000 mL | ORAL | Status: DC | PRN

## 2015-10-20 MED ORDER — TRANEXAMIC ACID 1000 MG/10ML IV SOLN
2000.0000 mg | Freq: Once | INTRAVENOUS | Status: AC
  Administered 2015-10-20: 2000 mg via TOPICAL
  Filled 2015-10-20: qty 20

## 2015-10-20 MED ORDER — ONDANSETRON HCL 4 MG/2ML IJ SOLN
INTRAMUSCULAR | Status: DC | PRN
  Administered 2015-10-20: 4 mg via INTRAVENOUS

## 2015-10-20 SURGICAL SUPPLY — 46 items
BLADE SURG ROTATE 9660 (MISCELLANEOUS) IMPLANT
CAPT HIP TOTAL 2 ×1 IMPLANT
COVER PERINEAL POST (MISCELLANEOUS) ×2 IMPLANT
COVER SURGICAL LIGHT HANDLE (MISCELLANEOUS) ×2 IMPLANT
DRAPE C-ARM 42X72 X-RAY (DRAPES) ×2 IMPLANT
DRAPE STERI IOBAN 125X83 (DRAPES) ×2 IMPLANT
DRAPE U-SHAPE 47X51 STRL (DRAPES) ×4 IMPLANT
DRSG AQUACEL AG ADV 3.5X10 (GAUZE/BANDAGES/DRESSINGS) ×2 IMPLANT
DURAPREP 26ML APPLICATOR (WOUND CARE) ×2 IMPLANT
ELECT BLADE 4.0 EZ CLEAN MEGAD (MISCELLANEOUS) ×2
ELECT REM PT RETURN 9FT ADLT (ELECTROSURGICAL) ×2
ELECTRODE BLDE 4.0 EZ CLN MEGD (MISCELLANEOUS) ×1 IMPLANT
ELECTRODE REM PT RTRN 9FT ADLT (ELECTROSURGICAL) ×1 IMPLANT
FACESHIELD WRAPAROUND (MASK) ×4 IMPLANT
FACESHIELD WRAPAROUND OR TEAM (MASK) ×2 IMPLANT
GLOVE BIO SURGEON STRL SZ7.5 (GLOVE) ×2 IMPLANT
GLOVE BIO SURGEON STRL SZ8.5 (GLOVE) ×2 IMPLANT
GLOVE BIOGEL PI IND STRL 8 (GLOVE) ×1 IMPLANT
GLOVE BIOGEL PI IND STRL 9 (GLOVE) ×1 IMPLANT
GLOVE BIOGEL PI INDICATOR 8 (GLOVE) ×1
GLOVE BIOGEL PI INDICATOR 9 (GLOVE) ×1
GOWN STRL REUS W/ TWL LRG LVL3 (GOWN DISPOSABLE) ×1 IMPLANT
GOWN STRL REUS W/ TWL XL LVL3 (GOWN DISPOSABLE) ×2 IMPLANT
GOWN STRL REUS W/TWL LRG LVL3 (GOWN DISPOSABLE) ×2
GOWN STRL REUS W/TWL XL LVL3 (GOWN DISPOSABLE) ×4
KIT BASIN OR (CUSTOM PROCEDURE TRAY) ×2 IMPLANT
KIT ROOM TURNOVER OR (KITS) ×2 IMPLANT
MANIFOLD NEPTUNE II (INSTRUMENTS) ×2 IMPLANT
NDL 22X1.5 STRL (OR ONLY) (MISCELLANEOUS) ×2 IMPLANT
NEEDLE 22X1 1/2 OR ONLY (MISCELLANEOUS) ×2
NEEDLE 22X1.5 STRL (OR ONLY) (MISCELLANEOUS) ×2 IMPLANT
NS IRRIG 1000ML POUR BTL (IV SOLUTION) ×2 IMPLANT
PACK TOTAL JOINT (CUSTOM PROCEDURE TRAY) ×2 IMPLANT
PAD ARMBOARD 7.5X6 YLW CONV (MISCELLANEOUS) ×4 IMPLANT
SAW OSC TIP CART 19.5X105X1.3 (SAW) ×2 IMPLANT
SUT VIC AB 1 CTX 36 (SUTURE) ×2
SUT VIC AB 1 CTX36XBRD ANBCTR (SUTURE) ×1 IMPLANT
SUT VIC AB 2-0 CT1 27 (SUTURE) ×4
SUT VIC AB 2-0 CT1 TAPERPNT 27 (SUTURE) ×2 IMPLANT
SUT VIC AB 3-0 PS2 18 (SUTURE) ×2
SUT VIC AB 3-0 PS2 18XBRD (SUTURE) ×1 IMPLANT
SYR CONTROL 10ML LL (SYRINGE) ×4 IMPLANT
TOWEL OR 17X24 6PK STRL BLUE (TOWEL DISPOSABLE) ×2 IMPLANT
TOWEL OR 17X26 10 PK STRL BLUE (TOWEL DISPOSABLE) ×2 IMPLANT
TRAY FOLEY CATH 14FR (SET/KITS/TRAYS/PACK) IMPLANT
WATER STERILE IRR 1000ML POUR (IV SOLUTION) ×2 IMPLANT

## 2015-10-20 NOTE — Transfer of Care (Signed)
Immediate Anesthesia Transfer of Care Note  Patient: Jennifer [name]  Procedure(s) Performed: Procedure(s): TOTAL HIP ARTHROPLASTY ANTERIOR APPROACH (Right)  Patient Location: PACU  Anesthesia Type:Spinal  Level of Consciousness: awake, alert  and oriented  Airway & Oxygen Therapy: Patient Spontanous Breathing and Patient connected to face mask oxygen  Post-op Assessment: Report given to RN and Post -op Vital signs reviewed and stable  Post vital signs: Reviewed and stable  Last Vitals:  Vitals:   10/20/15 0826  BP: (!) 162/65  Pulse: 70  Resp: 18  Temp: 36.7 C    Last Pain:  Vitals:   10/20/15 0826  TempSrc: Oral         Complications: No apparent anesthesia complications

## 2015-10-20 NOTE — Progress Notes (Signed)
PHARMACIST - PHYSICIAN ORDER COMMUNICATION  CONCERNING: P&T Medication Policy on Herbal Medications  DESCRIPTION:  This patient's order for:  BIOTIN  has been noted.  This product(s) is classified as an "herbal" or natural product. Due to a lack of definitive safety studies or FDA approval, nonstandard manufacturing practices, plus the potential risk of unknown drug-drug interactions while on inpatient medications, the Pharmacy and Therapeutics Committee does not permit the use of "herbal" or natural products of this type within [facility].   ACTION TAKEN: The pharmacy department is unable to verify this order at this time and your patient has been informed of this safety policy. Please reevaluate patient's clinical condition at discharge and address if the herbal or natural product(s) should be resumed at that time.  Reatha Harps, Pharm.D., BCPS Clinical Pharmacist 10/20/2015 2:56 PM

## 2015-10-20 NOTE — Anesthesia Procedure Notes (Signed)
Date/Time: 10/20/2015 10:22 AM Performed by: Candis Shine Pre-anesthesia Checklist: Patient identified, Emergency Drugs available, Suction available and Patient being monitored Oxygen Delivery Method: Simple face mask Dental Injury: Teeth and Oropharynx as per pre-operative assessment

## 2015-10-20 NOTE — Anesthesia Postprocedure Evaluation (Signed)
Anesthesia Post Note  Patient: Jennifer [name]  Procedure(s) Performed: Procedure(s) (LRB): TOTAL HIP ARTHROPLASTY ANTERIOR APPROACH (Right)  Patient location during evaluation: PACU Anesthesia Type: Spinal Level of consciousness: oriented and awake and alert Pain management: pain level controlled Vital Signs Assessment: post-procedure vital signs reviewed and stable Respiratory status: spontaneous breathing, respiratory function stable and patient connected to nasal cannula oxygen Cardiovascular status: blood pressure returned to baseline and stable Postop Assessment: no headache, no backache, spinal receding and patient able to bend at knees Anesthetic complications: no    Last Vitals:  Vitals:   10/20/15 1400 10/20/15 1425  BP: (!) 132/59 127/62  Pulse: 65 70  Resp: 16 12  Temp:  36.6 C    Last Pain:  Vitals:   10/20/15 1425  TempSrc:   PainSc: 0-No pain    LLE Motor Response: Purposeful movement;Responds to commands (10/20/15 1425) LLE Sensation: Decreased (10/20/15 1425) RLE Motor Response: Purposeful movement;Responds to commands (wiggles toes) (10/20/15 1425) RLE Sensation: Decreased (10/20/15 1425) L Sensory Level: S1-Sole of foot, small toes (10/20/15 1425) R Sensory Level: S1-Sole of foot, small toes (10/20/15 1425)  Oneill Bais J

## 2015-10-20 NOTE — Interval H&P Note (Signed)
History and Physical Interval Note:  10/20/2015 10:08 AM  Jennifer [name]  has presented today for surgery, with the diagnosis of OSTEOARTHRITIS RIGHT HIP  The various methods of treatment have been discussed with the patient and family. After consideration of risks, benefits and other options for treatment, the patient has consented to  Procedure(s): TOTAL HIP ARTHROPLASTY ANTERIOR APPROACH (Right) as a surgical intervention .  The patient's history has been reviewed, patient examined, no change in status, stable for surgery.  I have reviewed the patient's chart and labs.  Questions were answered to the patient's satisfaction.     Kerin Salen

## 2015-10-20 NOTE — Anesthesia Preprocedure Evaluation (Addendum)
Anesthesia Evaluation  Patient identified by MRN, date of birth, ID band Patient awake  General Assessment Comment:Patient Active Problem List  Diagnosis Date Noted . Localization-related idiopathic epilepsy and epileptic syndromes with seizures of localized onset, not intractable, without status epilepticus ([place]) 09/09/2014 . Depression 07/19/2014 . Hx of adenomatous polyp of colon 03/29/2014 . Seizure after head injury ([place]) 08/29/2013 . OSA (obstructive sleep apnea) 04/24/2012 . PLMD (periodic limb movement disorder) 04/24/2012 . Fibroids, submucosal 12/18/2010 . HYPERLIPIDEMIA 07/04/2008 . ESSENTIAL HYPERTENSION, BENIGN 07/04/2008 . BAKER'S CYST, LEFT KNEE 07/04/2008 . 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    Pulmonary sleep apnea and Continuous Positive Airway Pressure Ventilation ,    Pulmonary exam normal breath sounds clear to auscultation       Cardiovascular hypertension, Normal cardiovascular exam+ dysrhythmias  Rhythm:Regular Rate:Normal     Neuro/Psych Seizures -, Well Controlled,  PSYCHIATRIC DISORDERS Anxiety Depression Seizures one day only 2 years ago. None since.    GI/Hepatic negative GI ROS, Neg liver ROS,   Endo/Other  negative endocrine ROS  Renal/GU negative Renal ROS  negative genitourinary   Musculoskeletal  (+) Arthritis ,   Abdominal   Peds negative pediatric ROS (+) ATTENTION DEFICIT DISORDER WITHOUT HYPERACTIVITY Hematology negative hematology ROS (+)   Anesthesia Other Findings   Reproductive/Obstetrics negative OB ROS                          Anesthesia Physical Anesthesia Plan  ASA: II  Anesthesia Plan: Spinal   Post-op Pain Management:    Induction:  Intravenous  Airway Management Planned: Natural Airway  Additional Equipment:   Intra-op Plan:   Post-operative Plan:   Informed Consent: I have reviewed the patients History and Physical, chart, labs and discussed the procedure including the risks, benefits and alternatives for the proposed anesthesia with the patient or authorized representative who has indicated his/her understanding and acceptance.     Plan Discussed with: CRNA  Anesthesia Plan Comments: (Discussed risks and benefits of and differences between spinal and general. Discussed risks of spinal including headache, backache, failure, bleeding and hematoma, infection, and nerve damage. Patient consents to spinal. Questions answered. Coagulation studies and platelet count acceptable.)       Anesthesia Quick Evaluation

## 2015-10-20 NOTE — Anesthesia Procedure Notes (Signed)
Spinal  Patient location during procedure: OR Staffing Anesthesiologist: Franne Grip Preanesthetic Checklist Completed: patient identified, site marked, surgical consent, pre-op evaluation, timeout performed, IV checked, risks and benefits discussed and monitors and equipment checked Spinal Block Patient position: sitting Prep: Betadine Patient monitoring: blood pressure, continuous pulse ox, cardiac monitor and heart rate Approach: midline Location: L3-4 Injection technique: single-shot Needle Needle type: Quincke  Needle gauge: 22 G Needle length: 9 cm Additional Notes CSF clear. No paresthesia noted.

## 2015-10-20 NOTE — Op Note (Signed)
OPERATIVE REPORT    DATE OF PROCEDURE:  10/20/2015       PREOPERATIVE DIAGNOSIS:  OSTEOARTHRITIS RIGHT HIP                                                          POSTOPERATIVE DIAGNOSIS:  OSTEOARTHRITIS RIGHT HIP                                                           PROCEDURE: Anterior R total hip arthroplasty using a 52 mm DePuy Gryption Cup, [employer], 0-degree polyethylene liner, a +1.5 36 mm ceramic head, a 3 Depuy Triloc stem   SURGEON: Nakai Yard J    ASSISTANT:   [employer] K[employer]  (present throughout entire procedure and necessary for timely completion of the procedure)   ANESTHESIA: Spinal BLOOD LOSS: 300 FLUID REPLACEMENT: 1600 crystalloid Antibiotic: 1 gm vancomycin Tranexamic Acid: 1gm iv, 2 gm topical COMPLICATIONS: none    INDICATIONS FOR PROCEDURE: A 65 y.o. year-old With  [facility]   for 2 years, x-rays show bone-on-bone arthritic changes, and osteophytes. Despite conservative measures with observation, anti-inflammatory medicine, narcotics, use of a cane, has severe unremitting pain and can ambulate only a few blocks before resting. Patient desires elective R total hip arthroplasty to decrease pain and increase function. The risks, benefits, and alternatives were discussed at length including but not limited to the risks of infection, bleeding, nerve injury, stiffness, blood clots, the need for revision surgery, cardiopulmonary complications, among others, and they were willing to proceed. Questions answered     PROCEDURE IN DETAIL: The patient was identified by armband,  received preoperative IV antibiotics in the holding area at [facility], taken to the operating room , appropriate anesthetic monitors  were attached and  anesthesia was induced with the patienton the gurney. The HANA boots were applied to the feet and he was then transferred to the HANA table with a peroneal post and support underneath the  non-operative le, which was locked in 5 lb traction. Theoperative lower extremity was then prepped and draped in the usual sterile fashion from just above the iliac crest to the knee. And a timeout procedure was performed. We then made a 12 cm incision along the interval at the leading edge of the tensor fascia lata of starting at 2 cm lateral to and 2 cm distal to the ASIS. Small bleeders in the skin and subcutaneous tissue identified and cauterized we dissected down to the fascia and made an incision in the fascia allowing [place] to elevate the fascia of the tensor muscle and exploited the interval between the rectus and the tensor fascia lata. A Hohmann retractor was then placed along the superior neck of the femur and a Cobra retractor along the inferior neck of the femur we teed the capsule starting out at the superior anterior aspect of the acetabulum going distally and made the T along the neck both leaflets of the T were tagged with #2 Ethibond suture. Cobra retractors were then placed along the inferior and superior neck allowing [place] to perform a standard neck cut and  removed the femoral head with a power corkscrew. We then placed a right angle Hohmann retractor along the anterior aspect of the acetabulum a spiked Cobra in the cotyloid notch and posteriorly a Muelller retractor. We then sequentially reamed up to a 51 mm basket reamer obtaining good coverage in all quadrants, verified by C-arm imaging. Under C-arm control with and hammered into place a 52 mm Gryption cup in 45 of abduction and 15 of anteversion. The cup seated nicely and required no supplemental screws. We then placed a central hole Eliminator and a 0 polyethylene liner. The foot was then externally rotated to 110, the HANA elevator was placed around the flare of the greater trochanter and the limb was extended and abducted delivering the proximal femur up into the wound. A medium Hohmann retractor was placed over the greater trochanter and a  Mueller retractor along the posterior femoral neck completing the exposure. We then performed releases superiorly and and inferiorly of the capsule going back to the pirformis fossa superiorly and to the lesser trochanter inferiorly. We then entered the proximal femur with the box cutting offset chisel followed by, a canal sounder, the chili pepper and broaching up to a 3 broach. This seated nicely and we reamed the calcar. A trial reduction was performed with a 1.5 mm 36 mm head.The limb lengths were excellent the hip was stable in 90 of external rotation. At this point the trial components removed and we hammered into place a # 3 Tri-Lock stem with Gryption coating. This was a hi offset stem and a + 1.5 36 mm ceramic ball was then hammered into place the hip was reduced and final C-arm images obtained. The wound was thoroughly irrigated with normal saline solution. We repaired the ant capsule and the tensor fascia lot a with running 0 vicryl suture. the subcutaneous tissue was closed with 2-0 and 3-0 Vicryl suture followed by an Aquacil dressing. At this point the patient was awaken and transferred to hospital gurney without difficulty. The subcutaneous tissue with 0 and 2-0 undyed Vicryl suture and the skin with running  3-0 vicryl subcuticular suture. Aquacil dressing was applied. The patient was then unclamped, rolled supine, awaken extubated and taken to recovery room without difficulty in stable condition.   Shanti Agresti J 10/20/2015, 11:47 AM

## 2015-10-20 NOTE — Discharge Instructions (Signed)

## 2015-10-21 LAB — CBC
HCT: 27.8 % — ABNORMAL LOW (ref 36.0–46.0)
Hemoglobin: 9.2 g/dL — ABNORMAL LOW (ref 12.0–15.0)
MCH: 30.1 pg (ref 26.0–34.0)
MCHC: 33.1 g/dL (ref 30.0–36.0)
MCV: 90.8 fL (ref 78.0–100.0)
PLATELETS: 246 10*3/uL (ref 150–400)
RBC: 3.06 MIL/uL — AB (ref 3.87–5.11)
RDW: 13.1 % (ref 11.5–15.5)
WBC: 10.5 10*3/uL (ref 4.0–10.5)

## 2015-10-21 NOTE — Progress Notes (Signed)
Physical Therapy Evaluation Patient Details Name: Jennifer [name] MRN: [id number] DOB: 1950/11/08 Today's Date: 10/21/2015   History of Present Illness  R hip THA. PMH: epilepsy, depression, anxiety, CHI, OSA, HTN, OA R TKA 1998.   Clinical Impression  Patient with good effort, is motivated.  MIN assist transfers, MOD assist bed mobility, gait with antalgic pattern and MIN/Supervision, stairs not tested today.  Patient has handouts for anterior hip precautions, will have 24 hour assist at home for 1 week.  Appropriate for return home soon, will continue on PT for stairs and bed mobility training again.    Follow Up Recommendations Home health PT;Supervision/Assistance - 24 hour    Equipment Recommendations  None recommended by PT (Patient has equipment)    Recommendations for Other Services OT consult     Precautions / Restrictions Precautions Precautions: Anterior Hip;Fall Precaution Booklet Issued: Yes (comment) Precaution Comments: Review precautions and exercises. Restrictions Weight Bearing Restrictions: Yes RLE Weight Bearing: Weight bearing as tolerated      Mobility  Bed Mobility Overal bed mobility: Needs Assistance Bed Mobility: Sit to Supine     Supine to sit: Mod assist Sit to supine: Mod assist   General bed mobility comments: Pt required cues for technique.  Transfers Overall transfer level: Needs assistance Equipment used: Rolling walker (2 wheeled) Transfers: Sit to/from Stand Sit to Stand: Min assist Stand pivot transfers: Min assist       General transfer comment: cues to kick out LE and to use proper hand placement.   Ambulation/Gait Ambulation/Gait assistance: Min guard;Supervision Ambulation Distance (Feet): 80 Feet Assistive device: Rolling walker (2 wheeled) Gait Pattern/deviations: Step-to pattern;Decreased stride length;Antalgic   Gait velocity interpretation: Below normal speed for age/gender    Stairs             Wheelchair Mobility    Modified Rankin (Stroke Patients Only)       Balance Overall balance assessment: Needs assistance Sitting-balance support: No upper extremity supported Sitting balance-Leahy Scale: Good     Standing balance support: Single extremity supported Standing balance-Leahy Scale: Fair                               Pertinent Vitals/Pain Pain Assessment: 0-10 Pain Score: 5  Pain Location: R hip Pain Descriptors / Indicators: Aching;Burning Pain Intervention(s): Limited activity within patient's tolerance;Monitored during session;Repositioned;Relaxation    Home Living Family/patient expects to be discharged to:: Private residence Living Arrangements: Other relatives Available Help at Discharge: Family;Available 24 hours/day Type of Home: House Home Access: Stairs to enter   [employer] of Steps: 2 Home Layout: One level Home Equipment: Walker - 2 wheels;Cane - single point Additional Comments: Pt. will have 24/7 for 1 week.     Prior Function Level of Independence: Independent         Comments: Had been using cane/walker due to pain for past several months     Hand Dominance   Dominant Hand: Right    Extremity/Trunk Assessment   Upper Extremity Assessment: Generalized weakness;Defer to OT evaluation           Lower Extremity Assessment: RLE deficits/detail;Generalized weakness RLE Deficits / Details: Pain, anterior hip precautions, weakness due to surgery.       Communication   Communication: No difficulties  Cognition Arousal/Alertness: Awake/alert Behavior During Therapy: WFL for tasks assessed/performed Overall Cognitive Status: Within Functional Limits for tasks assessed  General Comments      Exercises     Assessment/Plan    PT Assessment Patient needs continued PT services  PT Problem List Decreased strength;Decreased activity tolerance;Decreased balance;Decreased  mobility;Decreased knowledge of precautions;Pain          PT Treatment Interventions DME instruction;Gait training;Stair training;Functional mobility training;Therapeutic exercise;Therapeutic activities;Patient/family education    PT Goals (Current goals can be found in the Care Plan section)  Acute Rehab PT Goals Patient Stated Goal: To go home (go home.) PT Goal Formulation: With patient Time For Goal Achievement: 11/04/15 Potential to Achieve Goals: Good    Frequency 7X/week   Barriers to discharge        Co-evaluation               End of Session Equipment Utilized During Treatment: Gait belt Activity Tolerance: Patient tolerated treatment well;Patient limited by fatigue Patient left: in bed;with call bell/phone within reach;with family/visitor present;with SCD's reapplied Nurse Communication: Mobility status         Time: 1425-1505 PT Time Calculation (min) (ACUTE ONLY): 40 min   Charges:   PT Evaluation $PT Eval Moderate Complexity: 1 Procedure PT Treatments $Gait Training: 8-22 mins $Therapeutic Activity: 8-22 mins   PT G Codes:        Kimo Bancroft L 11-04-15, 3:16 PM

## 2015-10-21 NOTE — Progress Notes (Signed)
Pt stated she would place CPAP on herself later tonight. Informed to call RT if she needs help.

## 2015-10-21 NOTE — Evaluation (Signed)
Occupational Therapy Evaluation Patient Details Name: Jennifer [name] MRN: [id number] DOB: Nov 08, 1950 Today's Date: 10/21/2015    History of Present Illness R hip THA. PMH: epilepsy, depression, anxiety, CHI, OSA, HTN, OA R TKA 1998.    Clinical Impression   Pt. Was ed on use of AE for LE ADLs and needs to return demo. Pt. Needs further ed on tub transfer with seat. Pt. Is motivated to increase I with tasks. Pt. Will have 24/48for one week.     Follow Up Recommendations  Home health OT    Equipment Recommendations  3 in 1 bedside comode;Tub/shower seat    Recommendations for Other Services       Precautions / Restrictions Precautions Precautions: Anterior Hip;Fall Restrictions Weight Bearing Restrictions: Yes RLE Weight Bearing: Weight bearing as tolerated      Mobility Bed Mobility Overal bed mobility: Needs Assistance Bed Mobility: Supine to Sit     Supine to sit: Mod assist     General bed mobility comments: Pt required cues for technique.  Transfers Overall transfer level: Needs assistance Equipment used: Rolling walker (2 wheeled) Transfers: Sit to/from [employer] Sit to Stand: Min assist Stand pivot transfers: Min assist       General transfer comment: cues to kick out LE and to use proper hand placement.     Balance                                            ADL Overall ADL's : Needs assistance/impaired Eating/Feeding: Independent   Grooming: Wash/dry hands;Wash/dry face;Supervision/safety   Upper Body Bathing: Supervision/ safety;Set up   Lower Body Bathing: Moderate assistance   Upper Body Dressing : Supervision/safety;Set up   Lower Body Dressing: Moderate assistance;Maximal assistance   Toilet Transfer: Minimal assistance   Toileting- Clothing Manipulation and Hygiene: Minimal assistance       Functional mobility during ADLs: Minimal assistance General ADL Comments: Pt. AMB to bathroom  with walker. Pt. requried cues for proper hand placement and to kick out LE. Pt. ed on use of AE for LE ADLs.      Vision     Perception     Praxis      Pertinent Vitals/Pain Pain Assessment: 0-10 Pain Score: 2  Pain Location:  (R hip ) Pain Descriptors / Indicators: Aching Pain Intervention(s): Limited activity within patient's tolerance;Monitored during session     Hand Dominance Right   Extremity/Trunk Assessment Upper Extremity Assessment Upper Extremity Assessment: Generalized weakness           Communication Communication Communication: No difficulties   Cognition Arousal/Alertness: Awake/alert Behavior During Therapy: WFL for tasks assessed/performed Overall Cognitive Status: Within Functional Limits for tasks assessed                     General Comments       Exercises       Shoulder Instructions      Home Living Family/patient expects to be discharged to:: Private residence Living Arrangements: Other relatives Available Help at Discharge: Family;Available 24 hours/day Type of Home: House Home Access: Stairs to enter [employer] of Steps: 2   Home Layout: One level     Bathroom Shower/Tub: Tub/shower unit Shower/tub characteristics: Door Biochemist, clinical: Standard Bathroom Accessibility: Yes How Accessible: Accessible via walker Home Equipment: Walker - 2 wheels;Cane - single point   Additional  Comments: Pt. will have 24/7 for 1 week.       Prior Functioning/Environment Level of Independence: Independent                 OT Problem List: Decreased activity tolerance;Decreased knowledge of use of DME or AE   OT Treatment/Interventions: Self-care/ADL training;DME and/or AE instruction;Therapeutic activities    OT Goals(Current goals can be found in the care plan section) Acute Rehab OT Goals Patient Stated Goal:  (go home.) OT Goal Formulation: With patient Time For Goal Achievement: 11/04/15 Potential to  Achieve Goals: Good ADL Goals Pt Will Perform Lower Body Bathing: with modified independence;sit to/from stand;with adaptive equipment Pt Will Perform Lower Body Dressing: with modified independence;with adaptive equipment;sit to/from stand Pt Will Transfer to Toilet: with modified independence;bedside commode Pt Will Perform Toileting - Clothing Manipulation and hygiene: with modified independence;sit to/from stand Pt Will Perform Tub/Shower Transfer: Tub transfer;with supervision;shower seat  OT Frequency: Min 2X/week   Barriers to D/C:            Co-evaluation              End of Session Equipment Utilized During Treatment: Gait belt;Rolling walker  Activity Tolerance: Patient tolerated treatment well Patient left: in chair;with call bell/phone within reach;with family/visitor present   Time: 1212-1258 OT Time Calculation (min): 46 min Charges:  OT General Charges $OT Visit: 1 Procedure OT Evaluation $OT Eval Moderate Complexity: 1 Procedure OT Treatments $Self Care/Home Management : 23-37 mins G-Codes:    Angelina Neece 05-Nov-2015, 12:58 PM

## 2015-10-21 NOTE — Progress Notes (Signed)
   PATIENT ID: Jennifer [name]   1 Day Post-Op Procedure(s) (LRB): TOTAL HIP ARTHROPLASTY ANTERIOR APPROACH (Right)  Subjective: Doing well, sore around the incision. Some discomfort sleeping on back.  Objective:  Vitals:   10/21/15 0052 10/21/15 0558  BP: (!) 122/56 (!) 116/43  Pulse:  76  Resp:    Temp: 98.3 F (36.8 C) 98.1 F (36.7 C)     Right hip dressing c/d/i Wiggles toes, distally NVI Calf soft, nontender   Labs:   Recent Labs  10/21/15 0600  HGB 9.2*   Recent Labs  10/21/15 0600  WBC 10.5  RBC 3.06*  HCT 27.8*  PLT 246  No results for input(s): NA, K, CL, CO2, BUN, CREATININE, GLUCOSE, CALCIUM in the last 72 hours.  Assessment and Plan: 1 day s/p right THA Up with PT today Continue current pain mgmt abla- expected and asymptomatic, will continue to monitor D/c when cleared by PT, d/c orders in if she is doing well this afternoon, may need another day   VTE proph: ASA, SCDs

## 2015-10-22 LAB — CBC
HEMATOCRIT: 26.9 % — AB (ref 36.0–46.0)
Hemoglobin: 9 g/dL — ABNORMAL LOW (ref 12.0–15.0)
MCH: 29.8 pg (ref 26.0–34.0)
MCHC: 33.5 g/dL (ref 30.0–36.0)
MCV: 89.1 fL (ref 78.0–100.0)
Platelets: 223 10*3/uL (ref 150–400)
RBC: 3.02 MIL/uL — AB (ref 3.87–5.11)
RDW: 12.9 % (ref 11.5–15.5)
WBC: 12.9 10*3/uL — AB (ref 4.0–10.5)

## 2015-10-22 NOTE — Progress Notes (Signed)
   PATIENT ID: Jennifer [name]   2 Days Post-Op Procedure(s) (LRB): TOTAL HIP ARTHROPLASTY ANTERIOR APPROACH (Right)  Subjective: Walking down the hallways and did stairs this morning. Reports minimal pain and only taking tylenol.   Objective:  Vitals:   10/21/15 1924 10/22/15 0612  BP: (!) 106/42 (!) 121/54  Pulse:  78  Resp:    Temp:  98.2 F (36.8 C)     Right hip dressing c/d/i Wiggles toes, distally NVI Calf soft, nontender  Labs:   Recent Labs  10/21/15 0600 10/22/15 0407  HGB 9.2* 9.0*   Recent Labs  10/21/15 0600 10/22/15 0407  WBC 10.5 12.9*  RBC 3.06* 3.02*  HCT 27.8* 26.9*  PLT 246 223  No results for input(s): NA, K, CL, CO2, BUN, CREATININE, GLUCOSE, CALCIUM in the last 72 hours.  Assessment and Plan: 2 days s/p right THA D/c home today when cleared by PT Scripts in chart if needed ABLA- asymptomatic Fu with Dr. Mayer Camel   VTE proph: ASA, SCDs

## 2015-10-22 NOTE — Progress Notes (Signed)
Physical Therapy Treatment Patient Details Name: Jennifer [name] MRN: [id number] DOB: [birth year] Today's Date: 10/22/2015    History of Present Illness R hip THA. PMH: epilepsy, depression, anxiety, CHI, OSA, HTN, OA R TKA 1998.     PT Comments    Initiated stair training with patient with use of rail and cane and min/guard.  Pt also ambulated to bathroom as well and needed grab bars for transfer. Pt moving well overall and anticipate continued progress. Recommend 3-1 BSC and HHPT.  Follow Up Recommendations  Home health PT;Supervision/Assistance - 24 hour     Equipment Recommendations  3in1 (PT)    Recommendations for Other Services       Precautions / Restrictions Precautions Precautions: Fall Restrictions Weight Bearing Restrictions: Yes RLE Weight Bearing: Weight bearing as tolerated    Mobility  Bed Mobility Overal bed mobility: Modified Independent Bed Mobility: Supine to Sit     Supine to sit: Modified independent (Device/Increase time)     General bed mobility comments: Pt able to to come to bed without any physical A or cues, but required increased time.  Transfers Overall transfer level: Needs assistance Equipment used: Rolling walker (2 wheeled) Transfers: Sit to/from Stand Sit to Stand: Min guard         General transfer comment: cues for hand placement  Ambulation/Gait Ambulation/Gait assistance: Supervision;Min guard Ambulation Distance (Feet): 160 Feet Assistive device: Rolling walker (2 wheeled) Gait Pattern/deviations: Step-through pattern;Antalgic   Gait velocity interpretation: Below normal speed for age/gender General Gait Details: cues for head up   Stairs Stairs: Yes Stairs assistance: Min guard Stair Management: One rail Left;Step to pattern;With cane Number of Stairs: 2 (x 2) General stair comments: Educated on step to pattern with use of cane and rail.  Pt anxious about descent, but able to do with min/guard.  Issued  illustrated handout  Wheelchair Mobility    Modified Rankin (Stroke Patients Only)       Balance Overall balance assessment: Needs assistance Sitting-balance support: Feet supported Sitting balance-Leahy Scale: Good     Standing balance support: During functional activity Standing balance-Leahy Scale: Fair                      Cognition Arousal/Alertness: Awake/alert Behavior During Therapy: WFL for tasks assessed/performed Overall Cognitive Status: Within Functional Limits for tasks assessed                      Exercises      General Comments        Pertinent Vitals/Pain Pain Assessment: 0-10 Pain Score: 1  Pain Location: R hip Pain Descriptors / Indicators: Sore Pain Intervention(s): Limited activity within patient's tolerance;Monitored during session    Home Living                      Prior Function            PT Goals (current goals can now be found in the care plan section) Acute Rehab PT Goals Patient Stated Goal: To go home PT Goal Formulation: With patient Time For Goal Achievement: 11/04/15 Potential to Achieve Goals: Good Progress towards PT goals: Progressing toward goals    Frequency    7X/week      PT Plan Current plan remains appropriate    Co-evaluation             End of Session Equipment Utilized During Treatment: Gait belt Activity Tolerance: Patient tolerated treatment well  Patient left: in chair;with call bell/phone within reach;Other (comment) (with breakfast tray)     Time: [id number] PT Time Calculation (min) (ACUTE ONLY): 25 min  Charges:  $Gait Training: 23-37 mins                    G Codes:      Jennifer [name] 10/22/2015, 9:11 AM

## 2015-10-22 NOTE — Progress Notes (Signed)
Jennifer [name] to be D/C'd Home per MD order.  Discussed with the patient and all questions fully answered.  VSS, Skin clean, dry and intact without evidence of skin break down, no evidence of skin tears noted. IV catheter discontinued intact. Site without signs and symptoms of complications. Dressing and pressure applied.  An After Visit Summary was printed and given to the patient. Patient received prescription.  D/c education completed with patient/family including follow up instructions, medication list, d/c activities limitations if indicated, with other d/c instructions as indicated by MD - patient able to verbalize understanding, all questions fully answered.   Patient instructed to return to ED, call 911, or call MD for any changes in condition.   Patient will be escorted via WC, and D/C home via private auto.  Jerry Caras 10/22/2015 1:00 PM

## 2015-10-22 NOTE — Progress Notes (Signed)
Occupational Therapy Treatment Patient Details Name: Jennifer [name] MRN: [id number] DOB: 07-Jun-1950 Today's Date: 10/22/2015    History of present illness R hip THA. PMH: epilepsy, depression, anxiety, CHI, OSA, HTN, OA R TKA 1998.    OT comments  Reviewed DME for tub along with modifications of tub for safe tub transfer once home. Pt. Reports she will sponge bathe initially until modifications made and she has practiced with [facility] therapies.  Reports no other questions or concerns. Sister will be with her for one week upon d/c home.     Follow Up Recommendations  Home health OT    Equipment Recommendations  3 in 1 bedside comode;Tub/shower seat    Recommendations for Other Services      Precautions / Restrictions Precautions Precautions: Fall Restrictions Weight Bearing Restrictions: Yes RLE Weight Bearing: Weight bearing as tolerated       Mobility Bed Mobility Overal bed mobility: Modified Independent Bed Mobility: Supine to Sit     Supine to sit: Modified independent (Device/Increase time)     General bed mobility comments: Pt able to to come to bed without any physical A or cues, but required increased time.  Transfers Overall transfer level: Needs assistance Equipment used: Rolling walker (2 wheeled) Transfers: Sit to/from Stand Sit to Stand: Min guard         General transfer comment: cues for hand placement    Balance Overall balance assessment: Needs assistance Sitting-balance support: Feet supported Sitting balance-Leahy Scale: Good     Standing balance support: During functional activity Standing balance-Leahy Scale: Fair                     ADL Overall ADL's : Needs assistance/impaired                                 Tub/ Shower Transfer: Tub Product manager Details (indicate cue type and reason): R faucet tub with doors   General ADL Comments: pt. reports she has been amb. and using RW for 2-3 months  now and has had no trouble with tub transfer.  with further discussion of this transfer pt. reveals doors in place, describes turning a full circle in the tub to face the facuet. pt. also reports she will now likely use 3n1 in tub.  educated on need for shower doors removed with addtion of hand held shower head for ease.  discussed side stepping as safer method vs entering backwards and turning in a circle.  pt. states she will sponge bathe initially until her sister can remove shower doors then plans to practice tub transfer in her own environment with [facility] therapies prior to attempting on her own.  i agree with this plan and urged her not to attempt without first practicing with therapy.  states sister will be home with her one week.  had no further questions or concerns.        Vision                     Perception     Praxis      Cognition   Behavior During Therapy: [facility] for tasks assessed/performed Overall Cognitive Status: Within Functional Limits for tasks assessed                       Extremity/Trunk Assessment  Exercises     Shoulder Instructions       General Comments      Pertinent Vitals/ Pain       Pain Assessment: 0-10 Pain Score: 1  Pain Location: R hip Pain Descriptors / Indicators: Sore Pain Intervention(s): Limited activity within patient's tolerance;Monitored during session  Home Living                                          Prior Functioning/Environment              Frequency  Min 2X/week        Progress Toward Goals  OT Goals(current goals can now be found in the care plan section)  Progress towards OT goals: Progressing toward goals  Acute Rehab OT Goals Patient Stated Goal: To go home  Plan Discharge plan remains appropriate    Co-evaluation                 End of Session     Activity Tolerance Patient tolerated treatment well   Patient Left in chair;with call  bell/phone within reach   Nurse Communication          Time: [id number] OT Time Calculation (min): 11 min  Charges: OT General Charges $OT Visit: 1 Procedure OT Treatments $Self Care/Home Management : 8-22 mins  Janice Coffin, COTA/L 10/22/2015, 11:05 AM

## 2015-10-23 ENCOUNTER — Encounter (HOSPITAL_COMMUNITY): Payer: Self-pay | Admitting: Orthopedic Surgery

## 2015-11-06 NOTE — Discharge Summary (Signed)
Patient ID: Jennifer [name] MRN: [id number] DOB/AGE: [birth year] 64 y.o.  Admit date: 10/20/2015 Discharge date: 10/22/2015  Admission Diagnoses:  Principal Problem:   Primary osteoarthritis of right hip Active Problems:   Arthritis of right hip   Discharge Diagnoses:  Same  Past Medical History:  Diagnosis Date  . Anxiety   . Depression   . Dysrhythmia    FLB - ? PAC'S COMES AND GOES  . High cholesterol   . Hx of adenomatous polyp of colon    2012 - [facility]  . Hyperlipidemia   . Hypertension   . Osteoarthritis   . PLMD (periodic limb movement disorder) 04/24/2012  . Seizures ([place])    08/29/13  . Sleep apnea    wears CPAP    Surgeries: Procedure(s): TOTAL HIP ARTHROPLASTY ANTERIOR APPROACH on 10/20/2015   Consultants:   Discharged Condition: Improved  Hospital Course: Jennifer [name] is an 65 y.o. female who was admitted 10/20/2015 for operative treatment ofPrimary osteoarthritis of right hip. Patient has severe unremitting pain that affects sleep, daily activities, and work/hobbies. After pre-op clearance the patient was taken to the operating room on 10/20/2015 and underwent  Procedure(s): TOTAL HIP ARTHROPLASTY ANTERIOR APPROACH.    Patient was given perioperative antibiotics:  Anti-infectives    Start     Dose/Rate Route Frequency Ordered Stop   10/20/15 0912  vancomycin (VANCOCIN) 1-5 GM/200ML-% IVPB    Comments:  Merryl Hacker   : cabinet override      10/20/15 0912 10/20/15 1020   10/20/15 0854  vancomycin (VANCOCIN) IVPB 1000 mg/200 mL premix     1,000 mg 200 mL/hr over 60 Minutes Intravenous On call to O.R. 10/20/15 0854 10/20/15 1020       Patient was given sequential compression devices, early ambulation, and ASA to prevent DVT.  Patient benefited maximally from hospital stay and there were no complications.    Recent vital signs: No data found.    Recent laboratory studies: No results for input(s): WBC, HGB, HCT, PLT, NA, K, CL,  CO2, BUN, CREATININE, GLUCOSE, INR, CALCIUM in the last 72 hours.  Invalid input(s): PT, 2   Discharge Medications:     Medication List    STOP taking these medications   HYDROcodone-acetaminophen 5-325 MG tablet Commonly known as:  NORCO/VICODIN   ibuprofen 200 MG tablet Commonly known as:  ADVIL,MOTRIN     TAKE these medications   acetaminophen 325 MG tablet Commonly known as:  TYLENOL Take 650 mg by mouth every 6 (six) hours as needed (for breakthrough pain.).   aspirin EC 325 MG tablet Take 1 tablet (325 mg total) by mouth 2 (two) times daily. What changed:  medication strength  how much to take  when to take this   Biotin 1000 MCG tablet Take 1,000 mcg by mouth every evening.   cetirizine 10 MG tablet Commonly known as:  ZYRTEC Take 10 mg by mouth every evening.   citalopram 40 MG tablet Commonly known as:  CELEXA TAKE half TABLET BY MOUTH ONCE DAILY  20mg  What changed:  how much to take  how to take this  when to take this  additional instructions   levETIRAcetam 500 MG tablet Commonly known as:  KEPPRA Take 1 tablet (500 mg total) by mouth 2 (two) times daily.   losartan 50 MG tablet Commonly known as:  COZAAR Take 1 tablet (50 mg total) by mouth daily.   MELATONIN PO Take 5 mg by mouth at bedtime.   methocarbamol 500  MG tablet Commonly known as:  ROBAXIN Take 1 tablet (500 mg total) by mouth 2 (two) times daily with a meal.   multivitamin with minerals Tabs tablet Take 1 tablet by mouth every evening.   oxyCODONE-acetaminophen 5-325 MG tablet Commonly known as:  ROXICET Take 1 tablet by mouth every 4 (four) hours as needed.   pravastatin 40 MG tablet Commonly known as:  PRAVACHOL Take 1 tablet (40 mg total) by mouth daily. What changed:  when to take this   traMADol 50 MG tablet Commonly known as:  ULTRAM Take 100 mg by mouth daily.       Diagnostic Studies: Dg Chest 2 View  Result Date: 10/09/2015 CLINICAL DATA:  Joint  replacement . EXAM: CHEST  2 VIEW COMPARISON:  No recent prior. FINDINGS: Mediastinum and hilar structures are normal. Mild left base subsegmental atelectasis and/or scarring. No pleural effusion or pneumothorax IMPRESSION: Mild left base subsegmental atelectasis and/or scarring. Electronically Signed   By: Marcello Moores  Register   On: 10/09/2015 09:41   Dg C-arm 1-60 Min  Result Date: 10/20/2015 CLINICAL DATA:  Right total hip arthroplasty. EXAM: DG C-ARM 61-120 MIN; OPERATIVE RIGHT HIP WITH PELVIS COMPARISON:  None. FINDINGS: Well seated components of a total right hip arthroplasty. No complicating features are demonstrated. IMPRESSION: Total right hip arthroplasty with well seated components. No complicating features. Electronically Signed   By: Marijo Sanes M.D.   On: 10/20/2015 11:55   Dg Hip Operative Unilat W Or W/o Pelvis Right  Result Date: 10/20/2015 CLINICAL DATA:  Right total hip arthroplasty. EXAM: DG C-ARM 61-120 MIN; OPERATIVE RIGHT HIP WITH PELVIS COMPARISON:  None. FINDINGS: Well seated components of a total right hip arthroplasty. No complicating features are demonstrated. IMPRESSION: Total right hip arthroplasty with well seated components. No complicating features. Electronically Signed   By: Marijo Sanes M.D.   On: 10/20/2015 11:55    Disposition: 01-Home or Self Care  Discharge Instructions    Call MD / Call 911    Complete by:  As directed    If you experience chest pain or shortness of breath, CALL 911 and be transported to the hospital emergency room.  If you develope a fever above 101 F, pus (white drainage) or increased drainage or redness at the wound, or calf pain, call your surgeon's office.   Constipation Prevention    Complete by:  As directed    Drink plenty of fluids.  Prune juice may be helpful.  You may use a stool softener, such as Colace (over the counter) 100 mg twice a day.  Use MiraLax (over the counter) for constipation as needed.   Diet - low sodium heart  healthy    Complete by:  As directed    Increase activity slowly as tolerated    Complete by:  As directed       Follow-up Information    Kerin Salen, MD In 2 weeks.   Specialty:  Orthopedic Surgery Contact information: [place] [address] [phone]            Signed: Grier Mitts 11/06/2015, 2:22 PM

## 2016-03-05 ENCOUNTER — Telehealth: Payer: Self-pay | Admitting: Emergency Medicine

## 2016-03-05 NOTE — Telephone Encounter (Signed)
Pre-visit attempted. No answer left voicemail for pt to return call to office.

## 2016-03-08 ENCOUNTER — Encounter: Payer: Self-pay | Admitting: Family Medicine

## 2016-03-08 ENCOUNTER — Ambulatory Visit (INDEPENDENT_AMBULATORY_CARE_PROVIDER_SITE_OTHER): Payer: BLUE CROSS/BLUE SHIELD | Admitting: Family Medicine

## 2016-03-08 VITALS — BP 144/70 | HR 78 | Temp 98.0°F | Ht 64.0 in | Wt 219.8 lb

## 2016-03-08 DIAGNOSIS — F339 Major depressive disorder, recurrent, unspecified: Secondary | ICD-10-CM

## 2016-03-08 DIAGNOSIS — R635 Abnormal weight gain: Secondary | ICD-10-CM | POA: Diagnosis not present

## 2016-03-08 DIAGNOSIS — I10 Essential (primary) hypertension: Secondary | ICD-10-CM

## 2016-03-08 DIAGNOSIS — D649 Anemia, unspecified: Secondary | ICD-10-CM | POA: Diagnosis not present

## 2016-03-08 DIAGNOSIS — E2839 Other primary ovarian failure: Secondary | ICD-10-CM

## 2016-03-08 DIAGNOSIS — R5383 Other fatigue: Secondary | ICD-10-CM

## 2016-03-08 DIAGNOSIS — F3289 Other specified depressive episodes: Secondary | ICD-10-CM | POA: Diagnosis not present

## 2016-03-08 DIAGNOSIS — R319 Hematuria, unspecified: Secondary | ICD-10-CM

## 2016-03-08 DIAGNOSIS — Z23 Encounter for immunization: Secondary | ICD-10-CM

## 2016-03-08 MED ORDER — CITALOPRAM HYDROBROMIDE 40 MG PO TABS: ORAL_TABLET | ORAL | 3 refills | Status: DC
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
## 2016-03-08 NOTE — Progress Notes (Signed)
Pre visit review using our clinic review tool, if applicable. No additional management support is needed unless otherwise documented below in the visit note. 

## 2016-03-08 NOTE — Patient Instructions (Addendum)
Follow in 3 months I will put in a referral for the [facility] program , let me know if you don't hear from [name] by next week  Mediterranean Diet A Mediterranean diet refers to food and lifestyle choices that are based on the traditions of countries located on the [employer]. This way of eating has been shown to help prevent certain conditions and improve outcomes for people who have chronic diseases, like kidney disease and heart disease. What are tips for following this plan? Lifestyle   Cook and eat meals together with your family, when possible.  Drink enough fluid to keep your urine clear or pale yellow.  Be physically active every day. This includes:  Aerobic exercise like running or swimming.  Leisure activities like gardening, walking, or housework.  Get 7-8 hours of sleep each night.  If recommended by your health care provider, drink red wine in moderation. This means 1 glass a day for nonpregnant women and 2 glasses a day for men. A glass of wine equals 5 oz (150 mL). Reading food labels   Check the serving size of packaged foods. For foods such as rice and pasta, the serving size refers to the amount of cooked product, not dry.  Check the total fat in packaged foods. Avoid foods that have saturated fat or trans fats.  Check the ingredients list for added sugars, such as corn syrup. Shopping   At the grocery store, buy most of your food from the areas near the walls of the store. This includes:  Fresh fruits and vegetables (produce).  Grains, beans, nuts, and seeds. Some of these may be available in unpackaged forms or large amounts (in bulk).  Fresh seafood.  Poultry and eggs.  Low-fat dairy products.  Buy whole ingredients instead of prepackaged foods.  Buy fresh fruits and vegetables in-season from local farmers markets.  Buy frozen fruits and vegetables in resealable bags.  If you do not have access to quality fresh seafood, buy precooked  frozen shrimp or canned fish, such as tuna, salmon, or sardines.  Buy small amounts of raw or cooked vegetables, salads, or olives from the deli or salad bar at your store.  Stock your pantry so you always have certain foods on hand, such as olive oil, canned tuna, canned tomatoes, rice, pasta, and beans. Cooking   Cook foods with extra-virgin olive oil instead of using butter or other vegetable oils.  Have meat as a side dish, and have vegetables or grains as your main dish. This means having meat in small portions or adding small amounts of meat to foods like pasta or stew.  Use beans or vegetables instead of meat in common dishes like chili or lasagna.  Experiment with different cooking methods. Try roasting or broiling vegetables instead of steaming or sauteing them.  Add frozen vegetables to soups, stews, pasta, or rice.  Add nuts or seeds for added healthy fat at each meal. You can add these to yogurt, salads, or vegetable dishes.  Marinate fish or vegetables using olive oil, lemon juice, garlic, and fresh herbs. Meal planning   Plan to eat 1 vegetarian meal one day each week. Try to work up to 2 vegetarian meals, if possible.  Eat seafood 2 or more times a week.  Have healthy snacks readily available, such as:  Vegetable sticks with hummus.  Greek yogurt.  Fruit and nut trail mix.  Eat balanced meals throughout the week. This includes:  Fruit: 2-3 servings a day  Vegetables: 4-5 servings a day  Low-fat dairy: 2 servings a day  Fish, poultry, or lean meat: 1 serving a day  Beans and legumes: 2 or more servings a week  Nuts and seeds: 1-2 servings a day  Whole grains: 6-8 servings a day  Extra-virgin olive oil: 3-4 servings a day  Limit red meat and sweets to only a few servings a month What are my food choices?  Mediterranean diet  Recommended  Grains: Whole-grain pasta. Brown rice. Bulgar wheat. Polenta. Couscous. Whole-wheat bread. [place].  Vegetables: Artichokes. Beets. Broccoli. Cabbage. Carrots. Eggplant. Green beans. Chard. Kale. Spinach. Onions. Leeks. Peas. Squash. Tomatoes. Peppers. Radishes.  Fruits: Apples. Apricots. Avocado. Berries. Bananas. Cherries. Dates. Figs. Grapes. Lemons. Melon. Oranges. Peaches. Plums. Pomegranate.  Meats and other protein foods: Beans. Almonds. Sunflower seeds. Pine nuts. Peanuts. [place]. Salmon. Scallops. Shrimp. [place]. Tilapia. Clams. Oysters. Eggs.  Dairy: Low-fat milk. Cheese. Greek yogurt.  Beverages: Water. Red wine. Herbal tea.  Fats and oils: Extra virgin olive oil. Avocado oil. Grape seed oil.  Sweets and desserts: [place] yogurt with honey. Baked apples. Poached pears. Trail mix.  Seasoning and other foods: Basil. Cilantro. Coriander. Cumin. Mint. Parsley. Sage. Rosemary. Tarragon. Garlic. Oregano. Thyme. Pepper. Balsalmic vinegar. Tahini. Hummus. Tomato sauce. Olives. Mushrooms.  Limit these  Grains: Prepackaged pasta or rice dishes. Prepackaged cereal with added sugar.  Vegetables: Deep fried potatoes (french fries).  Fruits: Fruit canned in syrup.  Meats and other protein foods: Beef. Pork. Lamb. Poultry with skin. Hot dogs. Berniece Salines.  Dairy: Ice cream. Sour cream. Whole milk.  Beverages: Juice. Sugar-sweetened soft drinks. Beer. Liquor and spirits.  Fats and oils: Butter. Canola oil. Vegetable oil. Beef fat (tallow). Lard.  Sweets and desserts: Cookies. Cakes. Pies. Candy.  Seasoning and other foods: Mayonnaise. Premade sauces and marinades.  The items listed may not be a complete list. Talk with your dietitian about what dietary choices are right for you. Summary  The Mediterranean diet includes both food and lifestyle choices.  Eat a variety of fresh fruits and vegetables, beans, nuts, seeds, and whole grains.  Limit the amount of red meat and sweets that you eat.  Talk with your health care provider about whether it is safe for you to drink red wine in  moderation. This means 1 glass a day for nonpregnant women and 2 glasses a day for men. A glass of wine equals 5 oz (150 mL). This information is not intended to replace advice given to you by your health care provider. Make sure you discuss any questions you have with your health care provider. Document Released: 08/17/2015 Document Revised: 09/19/2015 Document Reviewed: 08/17/2015 Elsevier Interactive Patient Education  2017 [employer].

## 2016-03-11 ENCOUNTER — Telehealth: Payer: Self-pay

## 2016-03-11 NOTE — Telephone Encounter (Signed)
Received referral for Jennifer [name] for the [employer] from [place], NP at [facility] (thank you).  Called and left a VM for patient to call back to discuss the PREP.

## 2016-03-13 ENCOUNTER — Other Ambulatory Visit (INDEPENDENT_AMBULATORY_CARE_PROVIDER_SITE_OTHER): Payer: BLUE CROSS/BLUE SHIELD

## 2016-03-13 DIAGNOSIS — D649 Anemia, unspecified: Secondary | ICD-10-CM

## 2016-03-13 DIAGNOSIS — E559 Vitamin D deficiency, unspecified: Secondary | ICD-10-CM

## 2016-03-13 DIAGNOSIS — R635 Abnormal weight gain: Secondary | ICD-10-CM | POA: Diagnosis not present

## 2016-03-13 DIAGNOSIS — I10 Essential (primary) hypertension: Secondary | ICD-10-CM | POA: Diagnosis not present

## 2016-03-13 DIAGNOSIS — R5383 Other fatigue: Secondary | ICD-10-CM | POA: Diagnosis not present

## 2016-03-13 DIAGNOSIS — R319 Hematuria, unspecified: Secondary | ICD-10-CM

## 2016-03-13 LAB — COMPREHENSIVE METABOLIC PANEL WITH GFR
ALT: 20 U/L (ref 0–35)
AST: 18 U/L (ref 0–37)
Albumin: 4.2 g/dL (ref 3.5–5.2)
Alkaline Phosphatase: 76 U/L (ref 39–117)
BUN: 14 mg/dL (ref 6–23)
CO2: 25 meq/L (ref 19–32)
Calcium: 9.5 mg/dL (ref 8.4–10.5)
Chloride: 103 meq/L (ref 96–112)
Creatinine, Ser: 0.7 mg/dL (ref 0.40–1.20)
GFR: 89.22 mL/min
Glucose, Bld: 84 mg/dL (ref 70–99)
Potassium: 4.2 meq/L (ref 3.5–5.1)
Sodium: 137 meq/L (ref 135–145)
Total Bilirubin: 0.5 mg/dL (ref 0.2–1.2)
Total Protein: 6.9 g/dL (ref 6.0–8.3)

## 2016-03-13 LAB — CBC WITH DIFFERENTIAL/PLATELET
BASOS PCT: 0.7 % (ref 0.0–3.0)
Basophils Absolute: 0 10*3/uL (ref 0.0–0.1)
EOS PCT: 2.7 % (ref 0.0–5.0)
Eosinophils Absolute: 0.1 10*3/uL (ref 0.0–0.7)
HCT: 35.9 % — ABNORMAL LOW (ref 36.0–46.0)
HEMOGLOBIN: 12.3 g/dL (ref 12.0–15.0)
Lymphocytes Relative: 20 % (ref 12.0–46.0)
Lymphs Abs: 1.1 10*3/uL (ref 0.7–4.0)
MCHC: 34.1 g/dL (ref 30.0–36.0)
MCV: 87.8 fl (ref 78.0–100.0)
MONO ABS: 0.5 10*3/uL (ref 0.1–1.0)
MONOS PCT: 9.7 % (ref 3.0–12.0)
Neutro Abs: 3.6 10*3/uL (ref 1.4–7.7)
Neutrophils Relative %: 66.9 % (ref 43.0–77.0)
Platelets: 278 10*3/uL (ref 150.0–400.0)
RBC: 4.09 Mil/uL (ref 3.87–5.11)
RDW: 14.8 % (ref 11.5–15.5)
WBC: 5.4 10*3/uL (ref 4.0–10.5)

## 2016-03-13 LAB — URINALYSIS, ROUTINE W REFLEX MICROSCOPIC
Bilirubin Urine: NEGATIVE
Ketones, ur: NEGATIVE
Leukocytes, UA: NEGATIVE
Nitrite: NEGATIVE
Specific Gravity, Urine: <=1.005 — AB
Total Protein, Urine: NEGATIVE
Urine Glucose: NEGATIVE
Urobilinogen, UA: 0.2
pH: 6.5 (ref 5.0–8.0)

## 2016-03-13 LAB — HEMOGLOBIN A1C: HEMOGLOBIN A1C: 5.9 % (ref 4.6–6.5)

## 2016-03-13 LAB — VITAMIN B12: VITAMIN B 12: 384 pg/mL (ref 211–911)

## 2016-03-13 LAB — VITAMIN D 25 HYDROXY (VIT D DEFICIENCY, FRACTURES): VITD: 19.39 ng/mL — AB (ref 30.00–100.00)

## 2016-03-13 LAB — TSH: TSH: 4.34 u[IU]/mL (ref 0.35–4.50)

## 2016-03-14 MED ORDER — CHOLECALCIFEROL 1.25 MG (50000 UT) PO TABS: 50000.0000 [IU] | ORAL_TABLET | ORAL | 1 refills | Status: DC

## 2016-03-14 NOTE — Addendum Note (Signed)
Addended by: [name] B on: 03/14/2016 05:41 PM   Modules accepted: Orders

## 2016-03-15 ENCOUNTER — Telehealth: Payer: Self-pay | Admitting: Emergency Medicine

## 2016-03-15 ENCOUNTER — Other Ambulatory Visit: Payer: Self-pay

## 2016-03-15 ENCOUNTER — Other Ambulatory Visit (INDEPENDENT_AMBULATORY_CARE_PROVIDER_SITE_OTHER): Payer: BLUE CROSS/BLUE SHIELD

## 2016-03-15 DIAGNOSIS — E785 Hyperlipidemia, unspecified: Secondary | ICD-10-CM

## 2016-03-15 LAB — LIPID PANEL
CHOLESTEROL: 220 mg/dL — AB (ref 0–200)
HDL: 42.1 mg/dL (ref >39.00)
NonHDL: 177.4
Total CHOL/HDL Ratio: 5
Triglycerides: 245 mg/dL — ABNORMAL HIGH (ref 0.0–149.0)
VLDL: 49 mg/dL — AB (ref 0.0–40.0)

## 2016-03-15 LAB — LDL CHOLESTEROL, DIRECT: LDL DIRECT: 141 mg/dL

## 2016-03-15 NOTE — Telephone Encounter (Signed)
Patient called and states that she noticed that a lipid panel was not included in her blood work. She would like to know if the lipid panel can be added to the blood already taken. Patient also states that she has not heard anything from the [name]. Patient would like a call back on her office phone [phone].

## 2016-03-15 NOTE — Telephone Encounter (Signed)
Patient returned my call. I have added lipid panel to labs drawn two days ago. I have contacted [name] at [facility] regarding referral.

## 2016-03-15 NOTE — Telephone Encounter (Signed)
Called patient. No answer, left her a voice mail with my direct number.

## 2016-03-29 ENCOUNTER — Ambulatory Visit (INDEPENDENT_AMBULATORY_CARE_PROVIDER_SITE_OTHER)
Admission: RE | Admit: 2016-03-29 | Discharge: 2016-03-29 | Disposition: A | Discharge status: Home / Self Care | Payer: BLUE CROSS/BLUE SHIELD | Source: Ambulatory Visit | Attending: Family Medicine | Admitting: Family Medicine

## 2016-03-29 DIAGNOSIS — E2839 Other primary ovarian failure: Secondary | ICD-10-CM

## 2016-04-05 ENCOUNTER — Ambulatory Visit: Payer: Self-pay | Admitting: Neurology

## 2016-04-06 DIAGNOSIS — E2839 Other primary ovarian failure: Secondary | ICD-10-CM | POA: Diagnosis not present

## 2016-04-15 ENCOUNTER — Telehealth: Payer: Self-pay | Admitting: Family Medicine

## 2016-04-15 ENCOUNTER — Ambulatory Visit (INDEPENDENT_AMBULATORY_CARE_PROVIDER_SITE_OTHER): Payer: BLUE CROSS/BLUE SHIELD | Admitting: Family Medicine

## 2016-04-15 ENCOUNTER — Encounter: Payer: Self-pay | Admitting: Family Medicine

## 2016-04-15 VITALS — BP 138/76 | HR 82 | Temp 98.2°F | Wt 218.0 lb

## 2016-04-15 DIAGNOSIS — N3090 Cystitis, unspecified without hematuria: Secondary | ICD-10-CM | POA: Diagnosis not present

## 2016-04-15 DIAGNOSIS — R110 Nausea: Secondary | ICD-10-CM | POA: Diagnosis not present

## 2016-04-15 DIAGNOSIS — R300 Dysuria: Secondary | ICD-10-CM

## 2016-04-15 LAB — POCT URINALYSIS DIPSTICK
Bilirubin, UA: NEGATIVE
Blood, UA: 10
Glucose, UA: NEGATIVE
KETONES UA: NEGATIVE
Nitrite, UA: NEGATIVE
PH UA: 6 (ref 5.0–8.0)
PROTEIN UA: NEGATIVE
Spec Grav, UA: 1.01 (ref 1.030–1.035)
UROBILINOGEN UA: 0.2 (ref ?–2.0)

## 2016-04-15 MED ORDER — NITROFURANTOIN MONOHYD MACRO 100 MG PO CAPS: 100.0000 mg | ORAL_CAPSULE | Freq: Two times a day (BID) | ORAL | 0 refills | Status: DC

## 2016-04-15 MED ORDER — ONDANSETRON 8 MG PO TBDP: 8.0000 mg | ORAL_TABLET | Freq: Three times a day (TID) | ORAL | 0 refills | Status: DC | PRN

## 2016-04-15 NOTE — Patient Instructions (Signed)

## 2016-04-15 NOTE — Progress Notes (Signed)
Subjective:    Patient ID: Jennifer [name], female    DOB: 1950-01-19, 66 y.o.   MRN: [id number]  HPI This is a 66 yo female who presents today with urinary urgency and dysuria. X 3-4 days. Had an episode of diarrhea a couple days prior to urinary symptoms starting. No fever, some low back pain today. No lower abdominal pain or pressure. Nausea today, no vomiting. Has not had UTI in many years.   Past Medical History:  Diagnosis Date  . Allergy   . Anxiety   . Depression   . Dysrhythmia    FLB - ? PAC'S COMES AND GOES  . High cholesterol   . Hx of adenomatous polyp of colon    2012 - [place]  . Hyperlipidemia   . Hypertension   . Osteoarthritis   . PLMD (periodic limb movement disorder) 04/24/2012  . Seizures ([place])    08/29/13  . Sleep apnea    wears CPAP   Past Surgical History:  Procedure Laterality Date  . APPENDECTOMY    . CESAREAN SECTION    . CHOLECYSTECTOMY  1978  . COLONOSCOPY    . DILATION AND CURETTAGE OF UTERUS    . JOINT REPLACEMENT  1998   right knee  . OSTEOTOMY PROXIMAL FEMORAL    . 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   Procedure: TOTAL HIP ARTHROPLASTY ANTERIOR APPROACH;  Surgeon: Frederik Pear, MD;  Location: [place];  Service: Orthopedics;  Laterality: Right;   Family History  Problem Relation Age of Onset  . Cancer Father 68  . Esophageal cancer Father   . Heart disease Mother 74    Aortic valve disease  . Atrial fibrillation Brother   . Sleep apnea Brother   . Sleep apnea Sister   . Sleep apnea Sister   . Stomach cancer Maternal Grandmother   . Colon cancer Neg Hx   . Rectal cancer Neg Hx    Social History  Substance Use Topics  . Smoking status: Never Smoker  . Smokeless tobacco: Never Used  . Alcohol use No      Review of Systems  Constitutional: Negative for chills and fever.  Gastrointestinal: Positive for nausea. Negative for abdominal pain and vomiting.  Genitourinary: Positive for dysuria (this morning, improved as day  has gone on. ), frequency, hematuria (with wiping today) and urgency.  Musculoskeletal: Positive for back pain (across low back. ).       Objective:   Physical Exam  Constitutional: She is oriented to person, place, and time. She appears well-developed and well-nourished. She appears ill. No distress.  Cardiovascular: Normal rate, regular rhythm and normal heart sounds.   Pulmonary/Chest: Effort normal and breath sounds normal.  Abdominal: Soft. Bowel sounds are normal. She exhibits no distension. There is no tenderness. There is no rebound, no guarding and no CVA tenderness.  Neurological: She is alert and oriented to person, place, and time.  Skin: Skin is warm and dry. She is not diaphoretic.  Psychiatric: She has a normal mood and affect. Her behavior is normal. Judgment and thought content normal.  Vitals reviewed.     BP 138/76 (BP Location: Right Arm, Patient Position: Sitting, Cuff Size: Normal)   Pulse 82   Temp 98.2 F (36.8 C) (Oral)   Wt 218 lb (98.9 kg)   SpO2 94%   BMI 37.42 kg/m  Wt Readings from Last 3 Encounters:  04/15/16 218 lb (98.9 kg)  03/08/16 219 lb 12.8 oz (99.7 kg)  10/20/15  194 lb (88 kg)   Results for orders placed or performed in visit on 04/15/16  POCT urinalysis dipstick  Result Value Ref Range   Color, UA Yellow    Clarity, UA Clear    Glucose, UA Negative    Bilirubin, UA Negative    Ketones, UA Negative    Spec Grav, UA 1.010 1.030 - 1.035   Blood, UA 10 Ery/uL    pH, UA 6.0 5.0 - 8.0   Protein, UA Negative    Urobilinogen, UA 0.2 Negative - 2.0   Nitrite, UA Negative    Leukocytes, UA moderate (2+) (A) Negative       Assessment & Plan:  1. Dysuria - POCT urinalysis dipstick - Urine Culture  2. Cystitis - nitrofurantoin, macrocrystal-monohydrate, (MACROBID) 100 MG capsule; Take 1 capsule (100 mg total) by mouth 2 (two) times daily.  Dispense: 14 capsule; Refill: 0 - Urine Culture - Provided written and verbal information  regarding diagnosis and treatment. - RTC precautions reviewed  3. Nausea - ondansetron (ZOFRAN-ODT) 8 MG disintegrating tablet; Take 1 tablet (8 mg total) by mouth every 8 (eight) hours as needed for nausea.  Dispense: 12 tablet; Refill: 0   [name], FNP-BC  [facility] [place], [facility]  04/15/2016 3:44 PM

## 2016-04-15 NOTE — Telephone Encounter (Signed)
Patient called with concerns regarding urination including pressure,burning,urgency, and blood.  Acute appointment scheduled with NP Carlean Purl at 3:15pm.

## 2016-04-15 NOTE — Telephone Encounter (Signed)
FYI

## 2016-04-15 NOTE — Telephone Encounter (Signed)
Noted  

## 2016-04-15 NOTE — Progress Notes (Signed)
Pre visit review using our clinic review tool, if applicable. No additional management support is needed unless otherwise documented below in the visit note. 

## 2016-04-17 LAB — URINE CULTURE

## 2016-04-30 ENCOUNTER — Ambulatory Visit (INDEPENDENT_AMBULATORY_CARE_PROVIDER_SITE_OTHER): Payer: BLUE CROSS/BLUE SHIELD | Admitting: Neurology

## 2016-04-30 ENCOUNTER — Encounter: Payer: Self-pay | Admitting: Neurology

## 2016-04-30 VITALS — BP 124/68 | HR 82 | Wt 214.0 lb

## 2016-04-30 DIAGNOSIS — G40009 Localization-related (focal) (partial) idiopathic epilepsy and epileptic syndromes with seizures of localized onset, not intractable, without status epilepticus: Secondary | ICD-10-CM

## 2016-04-30 DIAGNOSIS — R569 Unspecified convulsions: Secondary | ICD-10-CM

## 2016-04-30 MED ORDER — LEVETIRACETAM 500 MG PO TABS: 500.0000 mg | ORAL_TABLET | Freq: Two times a day (BID) | ORAL | 3 refills | Status: DC
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
## 2016-07-08 ENCOUNTER — Encounter: Payer: Self-pay | Admitting: Family Medicine

## 2016-07-09 ENCOUNTER — Other Ambulatory Visit: Payer: Self-pay | Admitting: Family Medicine

## 2016-07-23 ENCOUNTER — Encounter: Payer: Self-pay | Admitting: Neurology

## 2016-07-31 ENCOUNTER — Encounter: Payer: Self-pay | Admitting: Neurology

## 2016-07-31 ENCOUNTER — Ambulatory Visit (INDEPENDENT_AMBULATORY_CARE_PROVIDER_SITE_OTHER): Payer: BLUE CROSS/BLUE SHIELD | Admitting: Neurology

## 2016-07-31 VITALS — BP 151/74 | HR 60 | Ht 63.0 in | Wt 198.0 lb

## 2016-07-31 DIAGNOSIS — G4733 Obstructive sleep apnea (adult) (pediatric): Secondary | ICD-10-CM

## 2016-07-31 DIAGNOSIS — Z9989 Dependence on other enabling machines and devices: Secondary | ICD-10-CM
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
## 2016-07-31 NOTE — Patient Instructions (Signed)
Please look into using a salt water nasal rinse system, such as the [employer] and follow the directions and my instructions. It may help your nasal congestion and your drainage and help you tolerate your CPAP.  Please continue using your CPAP regularly. While your insurance requires that you use CPAP at least 4 hours each night on 70% of the nights, I recommend, that you not skip any nights and use it throughout the night if you can. Getting used to CPAP and staying with the treatment long term does take time and patience and discipline. Untreated obstructive sleep apnea when it is moderate to severe can have an adverse impact on cardiovascular health and raise her risk for heart disease, arrhythmias, hypertension, congestive heart failure, stroke and diabetes. Untreated obstructive sleep apnea causes sleep disruption, nonrestorative sleep, and sleep deprivation. This can have an impact on your day to day functioning and cause daytime sleepiness and impairment of cognitive function, memory loss, mood disturbance, and problems focussing. Using CPAP regularly can improve these symptoms.

## 2016-08-02 ENCOUNTER — Encounter: Payer: Self-pay | Admitting: Family Medicine

## 2016-08-02 LAB — HM MAMMOGRAPHY

## 2016-08-30 ENCOUNTER — Encounter: Payer: Self-pay | Admitting: Family Medicine

## 2016-08-30 ENCOUNTER — Other Ambulatory Visit: Payer: Self-pay | Admitting: Emergency Medicine

## 2016-08-30 ENCOUNTER — Telehealth: Payer: Self-pay | Admitting: Family Medicine

## 2016-08-30 MED ORDER — LOSARTAN POTASSIUM 50 MG PO TABS: 50.0000 mg | ORAL_TABLET | Freq: Every day | ORAL | 1 refills | Status: DC

## 2016-08-30 NOTE — Telephone Encounter (Signed)
MEDICATION: losartan (COZAAR) 50 MG tablet  PHARMACY:    [facility], [address] (Phone) [phone] (Fax)     IS THIS A 90 DAY SUPPLY : Y  IS PATIENT OUT OF MEDICATION: N  IF NOT; HOW MUCH IS LEFT: Patient has enough to last until 09/04/16  LAST APPOINTMENT DATE: 04/15/16  NEXT APPOINTMENT DATE: 09/23/16  OTHER COMMENTS:    **Let patient know to contact pharmacy at the end of the day to make sure medication is ready. **  ** Please notify patient to allow 48-72 hours to process**  **Encourage patient to contact the pharmacy for refills or they can request refills through [facility]**

## 2016-08-30 NOTE — Telephone Encounter (Signed)
Prescription sent to pharmacy left message on voicemail informing patient.

## 2016-09-04 ENCOUNTER — Encounter: Payer: Self-pay | Admitting: Neurology

## 2016-09-04 ENCOUNTER — Ambulatory Visit (INDEPENDENT_AMBULATORY_CARE_PROVIDER_SITE_OTHER): Payer: BLUE CROSS/BLUE SHIELD | Admitting: Neurology

## 2016-09-04 DIAGNOSIS — G40009 Localization-related (focal) (partial) idiopathic epilepsy and epileptic syndromes with seizures of localized onset, not intractable, without status epilepticus: Secondary | ICD-10-CM

## 2016-09-04 MED ORDER — LEVETIRACETAM 500 MG PO TABS: 500.0000 mg | ORAL_TABLET | Freq: Two times a day (BID) | ORAL | 3 refills | Status: DC
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
## 2016-09-17 ENCOUNTER — Telehealth: Payer: Self-pay | Admitting: Family Medicine

## 2016-09-17 NOTE — Telephone Encounter (Signed)
MEDICATION:   pravastatin (PRAVACHOL) 40 MG tablet     PHARMACY:  [facility], [place] (Phone) [phone] (Fax)      IS THIS A 90 DAY SUPPLY : yes  IS PATIENT OUT OF MEDICATION: yes  IF NOT; HOW MUCH IS LEFT:   LAST APPOINTMENT DATE: @4 /9/18  NEXT APPOINTMENT DATE:@9 /17/2018  OTHER COMMENTS:    **Let patient know to contact pharmacy at the end of the day to make sure medication is ready. **  ** Please notify patient to allow 48-72 hours to process**  **Encourage patient to contact the pharmacy for refills or they can request refills through [facility]**

## 2016-09-20 ENCOUNTER — Other Ambulatory Visit: Payer: Self-pay | Admitting: Emergency Medicine

## 2016-09-20 MED ORDER — PRAVASTATIN SODIUM 40 MG PO TABS: 40.0000 mg | ORAL_TABLET | Freq: Every day | ORAL | 1 refills | Status: DC

## 2016-09-20 NOTE — Telephone Encounter (Signed)
Medication sent to pharmacy  

## 2016-09-23 ENCOUNTER — Ambulatory Visit: Payer: BLUE CROSS/BLUE SHIELD | Admitting: Family Medicine

## 2016-09-25 ENCOUNTER — Ambulatory Visit (INDEPENDENT_AMBULATORY_CARE_PROVIDER_SITE_OTHER): Payer: BLUE CROSS/BLUE SHIELD | Admitting: Family Medicine

## 2016-09-25 ENCOUNTER — Encounter: Payer: Self-pay | Admitting: Family Medicine

## 2016-09-25 VITALS — BP 140/70 | HR 63 | Temp 97.5°F | Wt 198.0 lb

## 2016-09-25 DIAGNOSIS — F339 Major depressive disorder, recurrent, unspecified: Secondary | ICD-10-CM | POA: Diagnosis not present

## 2016-09-25 DIAGNOSIS — E782 Mixed hyperlipidemia: Secondary | ICD-10-CM

## 2016-09-25 DIAGNOSIS — E559 Vitamin D deficiency, unspecified: Secondary | ICD-10-CM | POA: Diagnosis not present

## 2016-09-25 DIAGNOSIS — L989 Disorder of the skin and subcutaneous tissue, unspecified: Secondary | ICD-10-CM | POA: Diagnosis not present

## 2016-09-25 DIAGNOSIS — R7303 Prediabetes: Secondary | ICD-10-CM | POA: Diagnosis not present

## 2016-09-25 DIAGNOSIS — R21 Rash and other nonspecific skin eruption: Secondary | ICD-10-CM | POA: Diagnosis not present

## 2016-09-25 LAB — POCT GLYCOSYLATED HEMOGLOBIN (HGB A1C): Hemoglobin A1C: 5.1

## 2016-09-25 NOTE — Progress Notes (Signed)
Subjective:    Patient ID: Jennifer [name], female    DOB: 1950-01-22, 66 y.o.   MRN: [id number]  HPI This is a 66 yo female who presents today for follow up of depression and prediabetes. Feels well. Thinks Keppra makes her fatigued.   She has been doing Weight Watchers and has lost 21 pounds in last 5 months. Job ok. She is considering retiring in next 1-2 years. Thinks she would continue to work part time.   Concerned about changed skin lesion on left cheek. Has gotten darker in center. Has had rash on scalp at back of her head where CPAP strap goes. Has been using Tea Tree oil and has noticed improvement. Has put some fabric over strap.   Past Medical History:  Diagnosis Date  . Allergy   . Anxiety   . Depression   . Dysrhythmia    FLB - ? PAC'S COMES AND GOES  . High cholesterol   . Hx of adenomatous polyp of colon    2012 - [facility]  . Hyperlipidemia   . Hypertension   . Osteoarthritis   . PLMD (periodic limb movement disorder) 04/24/2012  . Seizures ([place])    08/29/13  . Sleep apnea    wears CPAP   Past Surgical History:  Procedure Laterality Date  . APPENDECTOMY    . CESAREAN SECTION    . CHOLECYSTECTOMY  1978  . COLONOSCOPY    . DILATION AND CURETTAGE OF UTERUS    . JOINT REPLACEMENT  1998   right knee  . OSTEOTOMY PROXIMAL FEMORAL    . 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   Procedure: TOTAL HIP ARTHROPLASTY ANTERIOR APPROACH;  Surgeon: Frederik Pear, MD;  Location: [facility];  Service: Orthopedics;  Laterality: Right;   Family History  Problem Relation Age of Onset  . Cancer Father 85  . Esophageal cancer Father   . Heart disease Mother 71       Aortic valve disease  . Atrial fibrillation Brother   . Sleep apnea Brother   . Sleep apnea Sister   . Sleep apnea Sister   . Stomach cancer Maternal Grandmother   . Colon cancer Neg Hx   . Rectal cancer Neg Hx    Social History  Substance Use Topics  . Smoking status: Never Smoker  . Smokeless tobacco:  Never Used  . Alcohol use No     Review of Systems  Constitutional: Positive for fatigue (chronic, unchanged. ).  Respiratory: Negative for cough, shortness of breath and wheezing.   Cardiovascular: Negative for chest pain and leg swelling.  Gastrointestinal: Negative for abdominal pain.       Objective:   Physical Exam Physical Exam  Constitutional: Oriented to person, place, and time. She appears well-developed and well-nourished.  HENT:  Head: Normocephalic and atraumatic.  Eyes: Conjunctivae are normal.  Neck: Normal range of motion. Neck supple.  Cardiovascular: Normal rate, regular rhythm and normal heart sounds.   Pulmonary/Chest: Effort normal and breath sounds normal.  Musculoskeletal: Normal range of motion.  Neurological: Alert and oriented to person, place, and time.  Skin: Skin is warm and dry. Left upper cheek with 1 cm raised, flesh colored lesion with dark center. Back of head with scattered scaly, non erythematous rash.  Psychiatric: Normal mood and affect. Behavior is normal. Judgment and thought content normal.  Vitals reviewed.     BP 140/70 (BP Location: Right Arm, Patient Position: Sitting, Cuff Size: Normal)   Pulse 63   Temp Marland Kitchen)  97.5 F (36.4 C) (Oral)   Wt 198 lb (89.8 kg)   SpO2 96%   BMI 32.95 kg/m  Wt Readings from Last 3 Encounters:  09/25/16 198 lb (89.8 kg)  09/04/16 197 lb (89.4 kg)  07/31/16 198 lb (89.8 kg)  03/08/16- weight 219 Results for orders placed or performed in visit on 09/25/16  POCT glycosylated hemoglobin (Hb A1C)  Result Value Ref Range   Hemoglobin A1C 5.1    Previous HgbA1C- 5.9  Depression screen [facility] 2/9 09/25/2016 09/02/2015 10/17/2014 07/19/2014 01/25/2014  Decreased Interest 0 0 0 2 0  Down, Depressed, Hopeless 0 - 0 2 0  PHQ - 2 Score 0 0 0 4 0  Altered sleeping 0 - - 0 -  Tired, decreased energy 0 - - 2 -  Change in appetite 0 - - 1 -  Feeling bad or failure about yourself  0 - - 2 -  Trouble concentrating 0  - - 0 -  Moving slowly or fidgety/restless 0 - - 0 -  Suicidal thoughts 0 - - 0 -  PHQ-9 Score 0 - - 9 -  Some encounter information is confidential and restricted. Go to Review Flowsheets activity to see all data.       Assessment & Plan:  1. Skin lesion - Ambulatory referral to Dermatology  2. Vitamin D deficiency - VITAMIN D 25 Hydroxy (Vit-D Deficiency, Fractures)- check in 4-6 months  3. Mixed hyperlipidemia - Lipid panel- check fasting in 4-6 months   4. Prediabetes - significant improvement with weight loss! Encouraged continued healthy food choices.  - POCT glycosylated hemoglobin (Hb A1C)  5. Rash in adult - improving with home treatment, if not better by time she is seen by derm, can have them look at it  6. Depression, recurrent - stable, continue citalopram  - follow up in 6 months  [name], FNP-BC  [facility] [place], [place]  09/27/2016 9:44 AM

## 2016-09-25 NOTE — Patient Instructions (Signed)
It was great to see you today!  Please have your labs done fasting in 4-6 months  Keep up the awesome job with your weight loss

## 2017-02-10 ENCOUNTER — Ambulatory Visit: Payer: Self-pay | Admitting: Family Medicine

## 2017-03-03 ENCOUNTER — Other Ambulatory Visit: Payer: Self-pay | Admitting: Family Medicine

## 2017-03-12 ENCOUNTER — Other Ambulatory Visit: Payer: Self-pay | Admitting: Family Medicine

## 2017-03-12 ENCOUNTER — Encounter: Payer: Self-pay | Admitting: Family Medicine

## 2017-03-12 DIAGNOSIS — E785 Hyperlipidemia, unspecified: Secondary | ICD-10-CM

## 2017-03-12 DIAGNOSIS — E1169 Type 2 diabetes mellitus with other specified complication: Secondary | ICD-10-CM

## 2017-03-12 DIAGNOSIS — I10 Essential (primary) hypertension: Secondary | ICD-10-CM

## 2017-03-12 DIAGNOSIS — E559 Vitamin D deficiency, unspecified: Secondary | ICD-10-CM

## 2017-03-12 DIAGNOSIS — E119 Type 2 diabetes mellitus without complications: Secondary | ICD-10-CM

## 2017-03-20 ENCOUNTER — Other Ambulatory Visit (INDEPENDENT_AMBULATORY_CARE_PROVIDER_SITE_OTHER): Payer: BLUE CROSS/BLUE SHIELD

## 2017-03-20 DIAGNOSIS — E1169 Type 2 diabetes mellitus with other specified complication: Secondary | ICD-10-CM

## 2017-03-20 DIAGNOSIS — E559 Vitamin D deficiency, unspecified: Secondary | ICD-10-CM | POA: Diagnosis not present

## 2017-03-20 DIAGNOSIS — E785 Hyperlipidemia, unspecified: Secondary | ICD-10-CM

## 2017-03-20 DIAGNOSIS — I10 Essential (primary) hypertension: Secondary | ICD-10-CM

## 2017-03-20 DIAGNOSIS — E119 Type 2 diabetes mellitus without complications: Secondary | ICD-10-CM

## 2017-03-20 LAB — HEMOGLOBIN A1C: Hgb A1c MFr Bld: 5.4 % (ref 4.6–6.5)

## 2017-03-20 LAB — CBC WITH DIFFERENTIAL/PLATELET
BASOS PCT: 1.1 % (ref 0.0–3.0)
Basophils Absolute: 0.1 10*3/uL (ref 0.0–0.1)
EOS PCT: 1.9 % (ref 0.0–5.0)
Eosinophils Absolute: 0.1 10*3/uL (ref 0.0–0.7)
HCT: 38.2 % (ref 36.0–46.0)
Hemoglobin: 13.3 g/dL (ref 12.0–15.0)
Lymphocytes Relative: 19.7 % (ref 12.0–46.0)
Lymphs Abs: 1 10*3/uL (ref 0.7–4.0)
MCHC: 34.7 g/dL (ref 30.0–36.0)
MCV: 88.3 fl (ref 78.0–100.0)
MONOS PCT: 10.9 % (ref 3.0–12.0)
Monocytes Absolute: 0.6 10*3/uL (ref 0.1–1.0)
NEUTROS PCT: 66.4 % (ref 43.0–77.0)
Neutro Abs: 3.5 10*3/uL (ref 1.4–7.7)
Platelets: 351 10*3/uL (ref 150.0–400.0)
RBC: 4.33 Mil/uL (ref 3.87–5.11)
RDW: 13 % (ref 11.5–15.5)
WBC: 5.2 10*3/uL (ref 4.0–10.5)

## 2017-03-20 LAB — COMPREHENSIVE METABOLIC PANEL
ALT: 17 U/L (ref 0–35)
AST: 16 U/L (ref 0–37)
Albumin: 4.3 g/dL (ref 3.5–5.2)
Alkaline Phosphatase: 74 U/L (ref 39–117)
BUN: 27 mg/dL — AB (ref 6–23)
CHLORIDE: 98 meq/L (ref 96–112)
CO2: 29 meq/L (ref 19–32)
Calcium: 9.8 mg/dL (ref 8.4–10.5)
Creatinine, Ser: 0.86 mg/dL (ref 0.40–1.20)
GFR: 70.14 mL/min (ref >60.00)
GLUCOSE: 86 mg/dL (ref 70–99)
POTASSIUM: 4.4 meq/L (ref 3.5–5.1)
SODIUM: 133 meq/L — AB (ref 135–145)
Total Bilirubin: 0.4 mg/dL (ref 0.2–1.2)
Total Protein: 7.5 g/dL (ref 6.0–8.3)

## 2017-03-20 LAB — TSH: TSH: 8.96 u[IU]/mL — ABNORMAL HIGH (ref 0.35–4.50)

## 2017-03-20 LAB — LIPID PANEL
Cholesterol: 322 mg/dL — ABNORMAL HIGH (ref 0–200)
HDL: 45.3 mg/dL (ref >39.00)
NonHDL: 276.89
TRIGLYCERIDES: 321 mg/dL — AB (ref 0.0–149.0)
Total CHOL/HDL Ratio: 7
VLDL: 64.2 mg/dL — AB (ref 0.0–40.0)

## 2017-03-20 LAB — LDL CHOLESTEROL, DIRECT: LDL DIRECT: 219 mg/dL

## 2017-03-20 LAB — VITAMIN D 25 HYDROXY (VIT D DEFICIENCY, FRACTURES): VITD: 53.91 ng/mL (ref 30.00–100.00)

## 2017-03-24 ENCOUNTER — Ambulatory Visit: Payer: BLUE CROSS/BLUE SHIELD | Admitting: Family Medicine

## 2017-03-24 ENCOUNTER — Encounter: Payer: Self-pay | Admitting: Family Medicine

## 2017-03-24 VITALS — BP 132/76 | HR 69 | Temp 97.6°F | Wt 190.0 lb

## 2017-03-24 DIAGNOSIS — E039 Hypothyroidism, unspecified: Secondary | ICD-10-CM | POA: Diagnosis not present

## 2017-03-24 DIAGNOSIS — I10 Essential (primary) hypertension: Secondary | ICD-10-CM

## 2017-03-24 DIAGNOSIS — E782 Mixed hyperlipidemia: Secondary | ICD-10-CM

## 2017-03-24 DIAGNOSIS — E119 Type 2 diabetes mellitus without complications: Secondary | ICD-10-CM

## 2017-03-24 MED ORDER — LOSARTAN POTASSIUM 50 MG PO TABS
50.0000 mg | ORAL_TABLET | Freq: Every day | ORAL | 3 refills | Status: DC
Start: 2017-03-24 — End: 2017-10-06

## 2017-03-24 MED ORDER — LEVOTHYROXINE SODIUM 50 MCG PO TABS: 50.0000 ug | ORAL_TABLET | Freq: Every day | ORAL | 3 refills | Status: DC

## 2017-03-24 MED ORDER — PRAVASTATIN SODIUM 80 MG PO TABS
80.0000 mg | ORAL_TABLET | Freq: Every day | ORAL | 3 refills | Status: DC
Start: 2017-03-24 — End: 2017-11-05

## 2017-03-24 NOTE — Patient Instructions (Addendum)
Finish up your every other week vitamin D and then can just take the daily vitamin D/calcium- you need [id number] IU daily of vitamin D  I have sent in a thyroid supplement to your pharmacy- take daily on an empty stomach  Please follow up in 6 months- labs first (i'll put the orders in), enjoy your retirement!

## 2017-03-24 NOTE — Progress Notes (Signed)
Subjective:    Patient ID: Jennifer [name], female    DOB: [birth year], 67 y.o.   MRN: [id number]  HPI This is a 67 yo female who presents today for follow up of HTN, hyperlipidemia. Had labs prior to visit.   She has been doing well with mood and continues [employer] with weight loss. She is going to retire in May. Work environment is too toxic. 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   Hypothyroidism- TSH 8.96, previously 4.34. Has had some recent constipation. BM every 1-2 days. Feels like weight loss efforts stalled.   Hyperlipidemia- Total cholesterol up to 322, LDL 219. Currently on pravastatin 40 mg, has been on 80 in past and tolerated well. Intolerant to Crestor.  HTN- no chest pain, no SOB, no palpitations, no edema  Past Medical History:  Diagnosis Date  . Allergy   . Anxiety   . Depression   . Dysrhythmia    FLB - ? PAC'S COMES AND GOES  . High cholesterol   . Hx of adenomatous polyp of colon    2012 - [facility]  . Hyperlipidemia   . Hypertension   . Osteoarthritis   . PLMD (periodic limb movement disorder) 04/24/2012  . Seizures ([place])    08/29/13  . Sleep apnea    wears CPAP   Past Surgical History:  Procedure Laterality Date  . APPENDECTOMY    . CESAREAN SECTION    . CHOLECYSTECTOMY  1978  . COLONOSCOPY    . DILATION AND CURETTAGE OF UTERUS    . JOINT REPLACEMENT  1998   right knee  . OSTEOTOMY PROXIMAL FEMORAL    . 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   Procedure: TOTAL HIP ARTHROPLASTY ANTERIOR APPROACH;  Surgeon: Frederik Pear, MD;  Location: [facility];  Service: Orthopedics;  Laterality: Right;   Family History  Problem Relation Age of Onset  . Cancer Father 30  . Esophageal cancer Father   . Heart disease Mother 68       Aortic valve disease  . Atrial fibrillation Brother   . Sleep apnea Brother   . Sleep apnea Sister   . Sleep apnea Sister   . Stomach cancer Maternal Grandmother   . Colon cancer Neg Hx   .  Rectal cancer Neg Hx    Social History   Tobacco Use  . Smoking status: Never Smoker  . Smokeless tobacco: Never Used  Substance Use Topics  . Alcohol use: No    Alcohol/week: 0.0 oz  . Drug use: No       Review of Systems Per HPI    Objective:   Physical Exam Physical Exam  Constitutional: Oriented to person, place, and time. She appears well-developed and well-nourished.  HENT:  Head: Normocephalic and atraumatic.  Eyes: Conjunctivae are normal.  Cardiovascular: Normal rate, regular rhythm and normal heart sounds.   Pulmonary/Chest: Effort normal and breath sounds normal.  Musculoskeletal: Normal range of motion.  Neurological: Alert and oriented to person, place, and time.  Skin: Skin is warm and dry.  Psychiatric: Normal mood and affect. Behavior is normal. Judgment and thought content normal.  Vitals reviewed.     BP 132/76   Pulse 69   Temp 97.6 F (36.4 C) (Oral)   Wt 190 lb (86.2 kg)   SpO2 98%   BMI 31.62 kg/m  Wt Readings from Last 3 Encounters:  03/24/17 190 lb (86.2 kg)  09/25/16 198 lb (89.8 kg)  09/04/16 197 lb (89.4 kg)       Assessment & Plan:  1. Acquired hypothyroidism - new diagnosis - Provided written and verbal information regarding diagnosis and treatment. - levothyroxine (SYNTHROID, LEVOTHROID) 50 MCG tablet; Take 1 tablet (50 mcg total) by mouth daily.  Dispense: 90 tablet; Refill: 3  2. Mixed hyperlipidemia - elevation likely related to out of range TSH since she continues to lose weight, will increase pravastatin and check levels in 6 months - pravastatin (PRAVACHOL) 80 MG tablet; Take 1 tablet (80 mg total) by mouth daily.  Dispense: 90 tablet; Refill: 3  3. Essential hypertension - well controlled - losartan (COZAAR) 50 MG tablet; Take 1 tablet (50 mg total) by mouth daily.  Dispense: 90 tablet; Refill: 3  4. Controlled type 2 diabetes mellitus without complication, without long-term current use of insulin (HCC) - last  hemoglobin A1C 5.4 off meds, encouraged continued healthy food choices and increased exercise as time allows with retirement - losartan (COZAAR) 50 MG tablet; Take 1 tablet (50 mg total) by mouth daily.  Dispense: 90 tablet; Refill: 3 - pravastatin (PRAVACHOL) 80 MG tablet; Take 1 tablet (80 mg total) by mouth daily.  Dispense: 90 tablet; Refill: [place], FNP-BC  [facility], [facility] [facility]  03/29/2017 5:53 PM

## 2017-03-31 ENCOUNTER — Encounter: Payer: Self-pay | Admitting: Family Medicine

## 2017-04-03 ENCOUNTER — Other Ambulatory Visit: Payer: Self-pay | Admitting: Family Medicine

## 2017-04-03 DIAGNOSIS — F3289 Other specified depressive episodes: Secondary | ICD-10-CM

## 2017-04-15 IMAGING — US US RENAL
1 series · 14 of 25 positions shown · non-contrast
Comparison: Abdominal and pelvic CT scan dated February 14, 2009

CLINICAL DATA: Microscopic hematuria

EXAM:
RENAL / URINARY TRACT ULTRASOUND COMPLETE

[Series 1: us renal · 0.28mm/px · 14 of 38 slices shown]
[im 1/38]
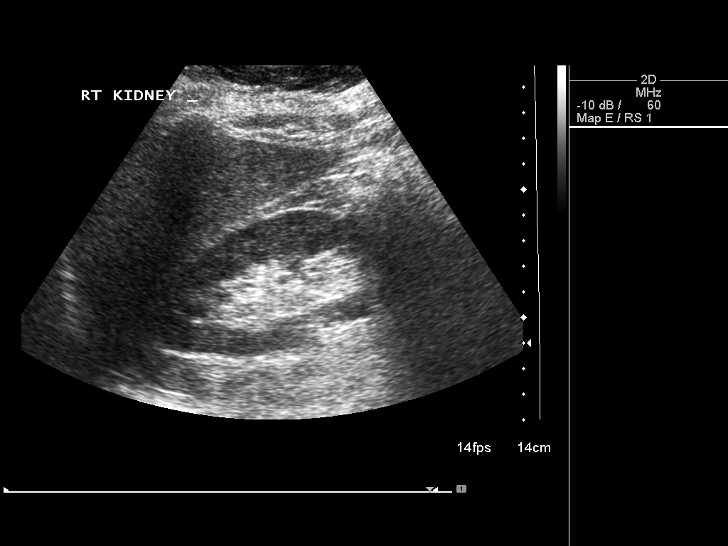
[im 4/38]
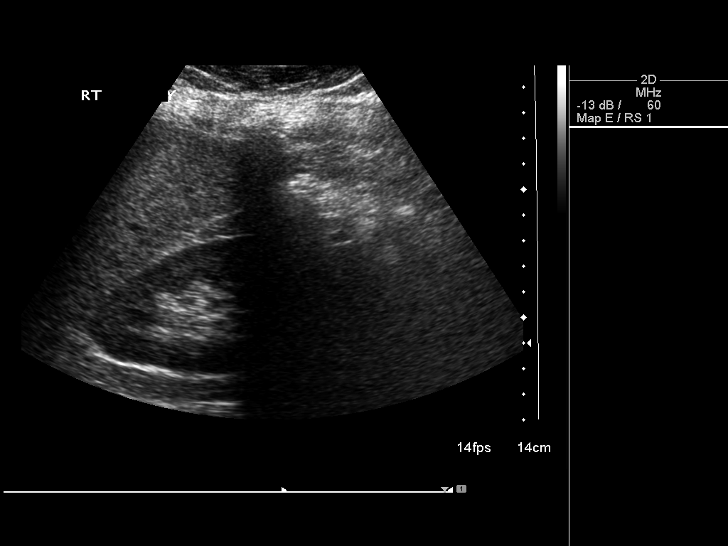
[im 7/38]
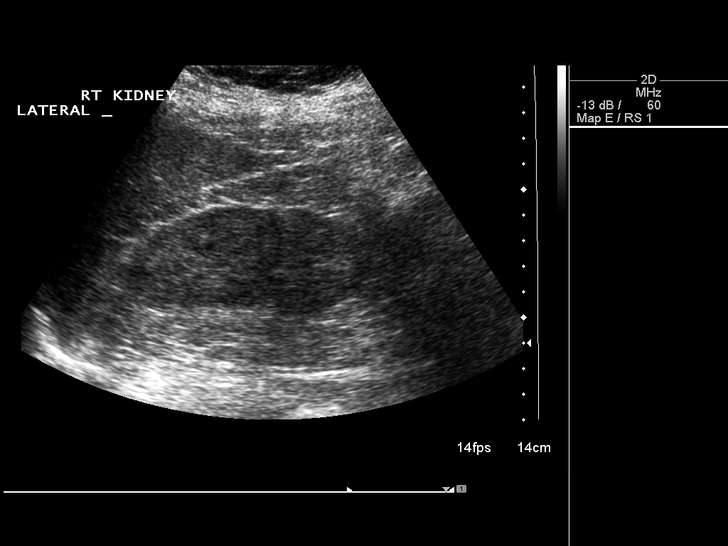
[im 10/38]
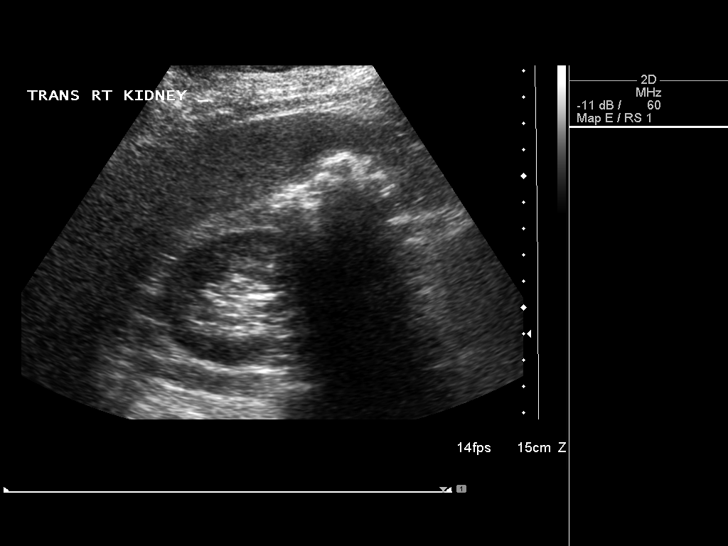
[im 13/38]
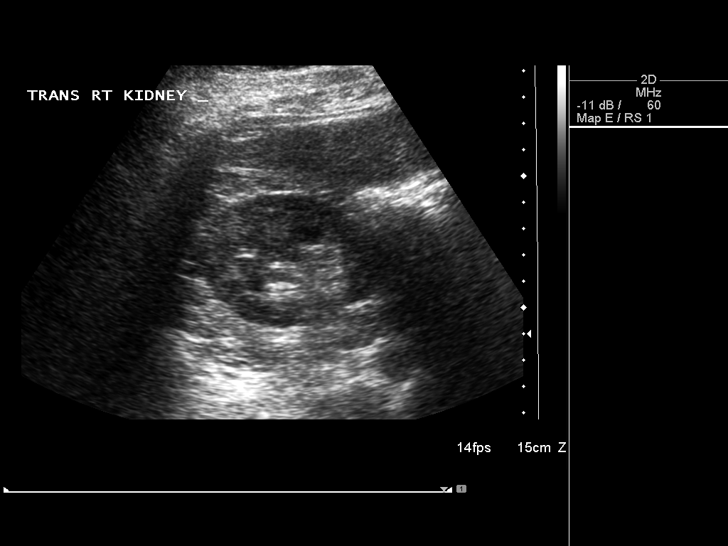
[im 14/38]
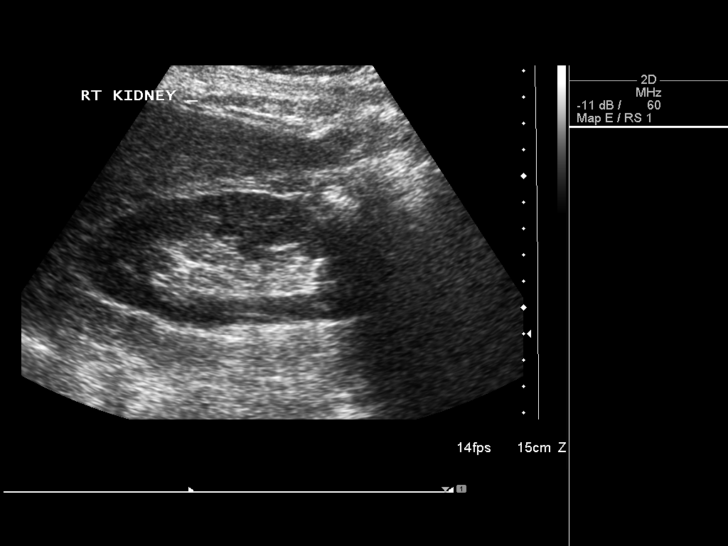
[im 17/38]
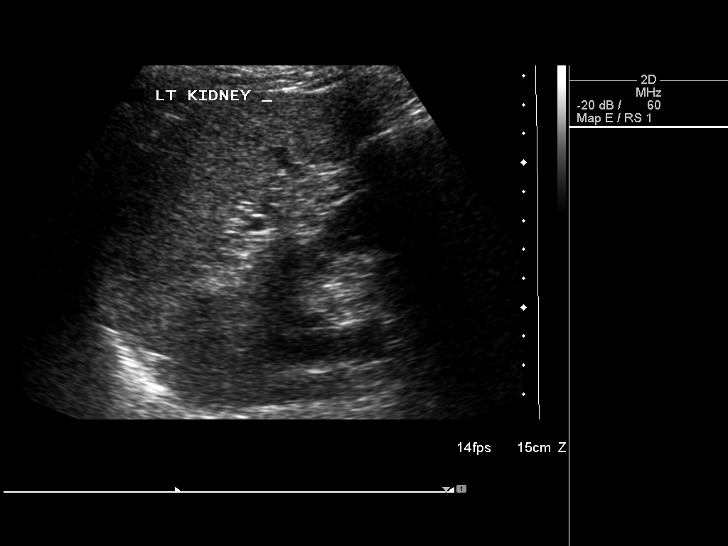
[im 21/38]
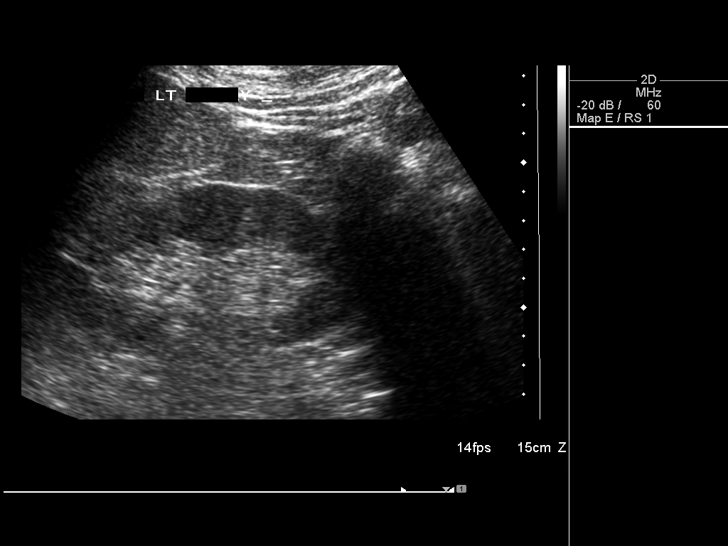
[im 24/38]
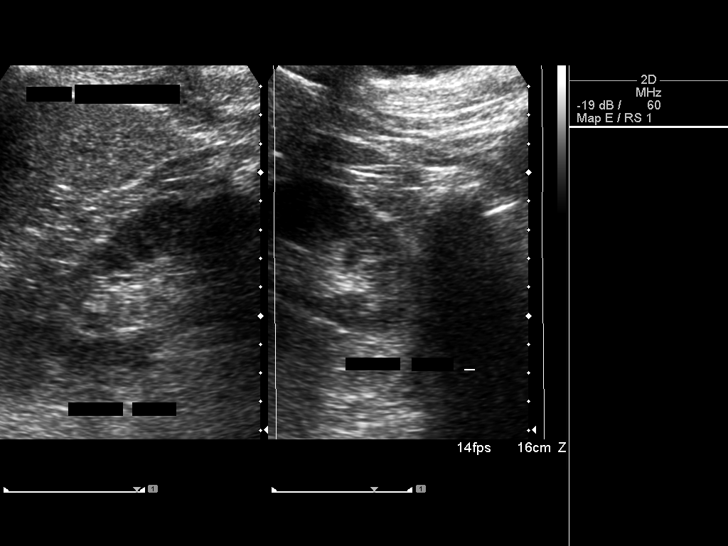
[im 25/38]
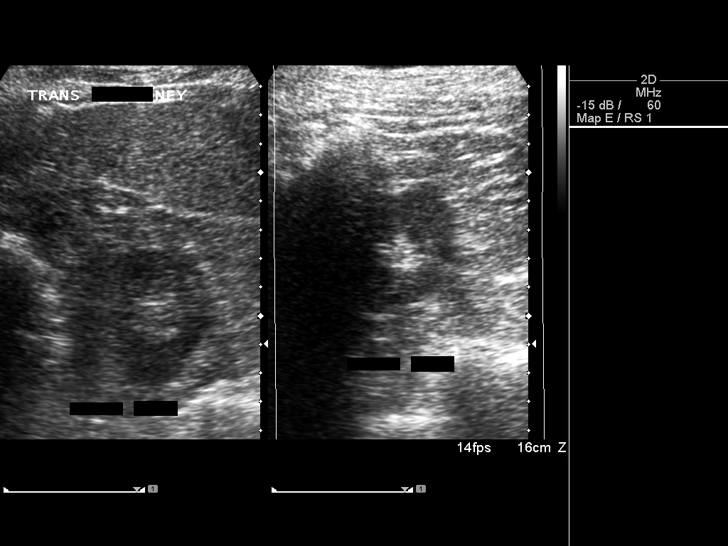
[im 28/38]
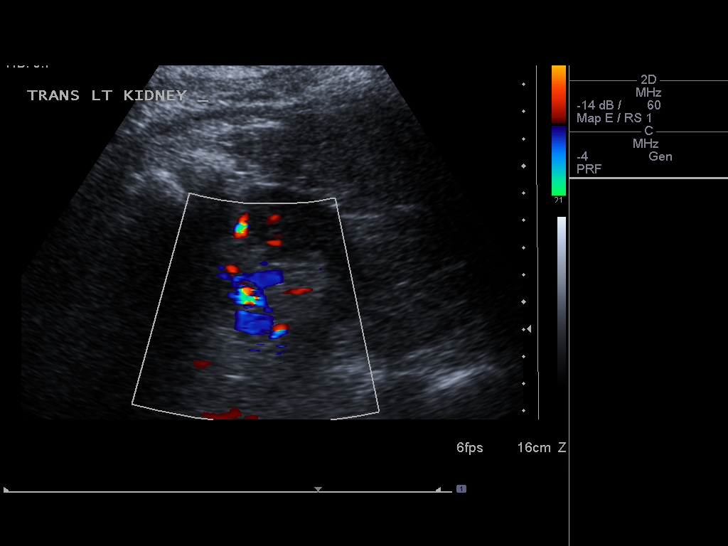
[im 31/38]
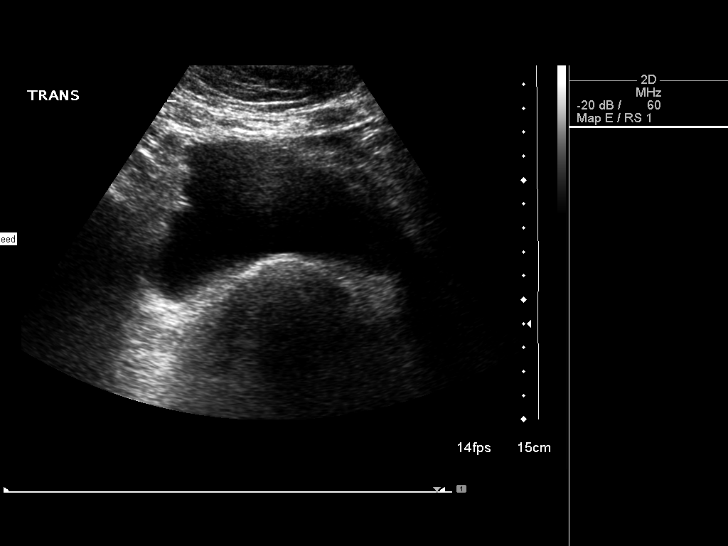
[im 34/38]
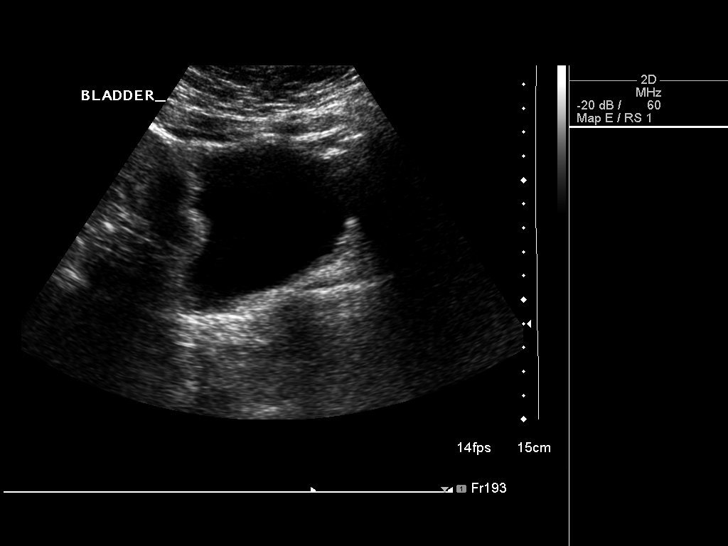
[im 38/38]
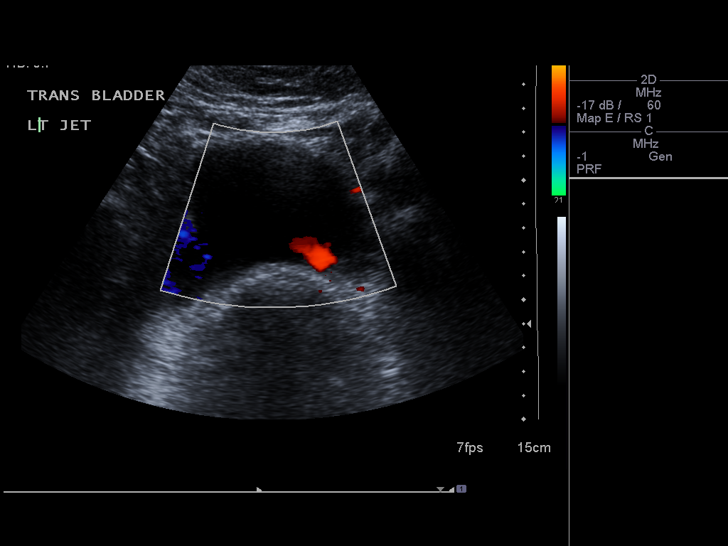

[14 of 25 positions shown; findings below may reference images not displayed]

FINDINGS: Right Kidney:

Length: 12.0 cm. Echogenicity within normal limits. No mass or
hydronephrosis visualized.

Left Kidney:

Length: 10.8 cm. Echogenicity within normal limits. No mass or
hydronephrosis visualized.

Bladder:

The partially distended urinary bladder is normal. Bladder jets were
observed bilaterally.
IMPRESSION: Normal appearance of both kidneys. No evidence of stones or abnormal
masses. A previous angiomyolipoma reported on the right is not
evident on today's study.

Normal appearance of the urinary bladder.

## 2017-05-21 NOTE — Progress Notes (Signed)
[facility] PREP Weekly Session   Patient Details  Name: Jennifer [name] MRN: [id number] Date of Birth: 11-26-1950 Age: 67 y.o. PCP: Elby Beck, FNP  There were no vitals filed for this visit.  [facility] Weekly seesion - 05/21/17 1000      Weekly Session   Topic Discussed  Health habits    Minutes exercised this week  135 minutes 45cardio/90strength   45cardio/90strength      Fun things you've done this week:"watched hockey on TV Things you're grateful for:"my rescue dog & roof over my head" Nutrition celebrations:"threw donut away!! Too sweet" Barriers:" getting hypothyroidism in check"   [name] 05/21/2017, 10:57 AM

## 2017-05-30 NOTE — Progress Notes (Signed)
[facility] PRE[facility] Weekly Session   Patient Details  Name: MARQUITTA PERSICHETTI MRN: [id number] Date of Birth: April 12, 1950 Age: 67 y.o. PCP: Elby Beck, FNP  There were no vitals filed for this visit.  [facility] Weekly seesion - 05/30/17 1300      Weekly Session   Topic Discussed  Restaurant Eating    Minutes exercised this week  60 minutes 15cardio/45strength   15cardio/45strength     Fun things you did since last meeting:"went to grasshoppers game" Things you are grateful for:"my family, God and his provisions" Nutrition celebrations:"lost weight at [facility]" Barriers:" busy at work"   [employer] 05/30/2017, 2:00 PM

## 2017-06-06 NOTE — Progress Notes (Signed)
[facility] PREP Weekly Session   Patient Details  Name: Jennifer [name] MRN: [id number] Date of Birth: 04-20-1950 Age: 68 y.o. PCP: Elby Beck, FNP  Vitals:    [facility] Weekly seesion - 06/06/17 0900      Weekly Session   Topic Discussed  Other    Minutes exercised this week  60 minutes 15cardio/45strength   15cardio/45strength     Fun things you did since last meeting:"Gave my final notice for retirement" Things you are grateful for:"roof over my head" Nutrition celebrations:"Held my weight-good for hypothyroidism" Barriers:"Hypothyroidism"    [name] 06/06/2017, 9:31 AM

## 2017-06-16 NOTE — Progress Notes (Signed)
[facility] PREP Weekly Session   Patient Details  Name: Jennifer [name] MRN: [id number] Date of Birth: [birth year] Age: 67 y.o. PCP: Elby Beck, FNP  Vitals:    [facility] [employer] Weekly seesion - 06/16/17 1200      Weekly Session   Topic Discussed  Stress management and problem solving    Minutes exercised this week  60 minutes 15cardio/45strength   15cardio/45strength     Fun things you did since last meeting:"Made graduation favors" Things you are grateful for:"My family, my dog, my church" Nutrition celebrations:"graduation party retirement luncheon" Barriers:"stiff neck, getting ready to retire"   [name] 06/16/2017, 12:43 PM

## 2017-06-23 NOTE — Progress Notes (Signed)
[facility] PREP Weekly Session   Patient Details  Name: Jennifer [name] MRN: [id number] Date of Birth: [birth year] Age: 67 y.o. PCP: Elby Beck, FNP  Vitals:    [facility] Weekly seesion - 06/23/17 1100      Weekly Session   Topic Discussed  Importance of resistance training    Minutes exercised this week  40 minutes strength   strength     Fun things you did since last meeting:"celebration for retirement" Things you are grateful for"My family, my faith, my dog, my friends" Nutrition celebrations:"Retirement eating celebrations" Barriers:"Lots of food!! Will get back on track soon"  [name] 06/23/2017, 11:39 AM

## 2017-06-30 NOTE — Progress Notes (Signed)
[facility] PREP Weekly Session   Patient Details  Name: Jennifer [name] MRN: [id number] Date of Birth: May 04, 1950 Age: 67 y.o. PCP: Elby Beck, FNP  There were no vitals filed for this visit.  [facility] [employer] Weekly seesion - 06/30/17 1400      Weekly Session   Topic Discussed  Expectations and non-scale victories    Minutes exercised this week  140 minutes 40cardio/80AAF classes   40cardio/80AAF classes     Fun things you did since last meeting:"AAF!!!" Things you are grateful for:"My family, protection from [employer], my dog" Nutrition celebrations:"Back on [employer], tracking, lower BP 117/71" Barriers:"Driving to [place] will be ok? :)"  [name] 06/30/2017, 2:28 PM

## 2017-07-14 NOTE — Progress Notes (Signed)
[facility] PREP Weekly Session   Patient Details  Name: TOYA PALACIOS MRN: [id number] Date of Birth: Aug 31, 1950 Age: 67 y.o. PCP: Elby Beck, FNP  There were no vitals filed for this visit.  [facility] Weekly seesion - 07/14/17 1100      Weekly Session   Topic Discussed  Hitting roadblocks    Minutes exercised this week  -- "90cardio/2x AAF classes"   "90cardio/2x AAF classes"    Fun things you did this past week:"visited [place]" Things you're grateful for:"My friends, church, family" Nutrition celebrations"July is going to be a good month!!" Barriers:"keeping on track"   [name] 07/14/2017, 11:42 AM

## 2017-08-04 ENCOUNTER — Ambulatory Visit (INDEPENDENT_AMBULATORY_CARE_PROVIDER_SITE_OTHER): Payer: Medicare Other | Admitting: Neurology

## 2017-08-04 ENCOUNTER — Encounter: Payer: Self-pay | Admitting: Neurology

## 2017-08-04 VITALS — BP 157/72 | HR 68 | Ht 63.0 in | Wt 194.0 lb

## 2017-08-04 DIAGNOSIS — G4733 Obstructive sleep apnea (adult) (pediatric): Secondary | ICD-10-CM | POA: Diagnosis not present

## 2017-08-04 DIAGNOSIS — Z9989 Dependence on other enabling machines and devices: Secondary | ICD-10-CM | POA: Diagnosis not present

## 2017-08-04 DIAGNOSIS — Z1231 Encounter for screening mammogram for malignant neoplasm of breast: Secondary | ICD-10-CM | POA: Diagnosis not present

## 2017-08-04 LAB — HM MAMMOGRAPHY
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
## 2017-08-04 NOTE — Patient Instructions (Addendum)
Please continue using your CPAP regularly. While your insurance requires that you use CPAP at least 4 hours each night on 70% of the nights, I recommend, that you not skip any nights and use it throughout the night if you can. Getting used to CPAP and staying with the treatment long term does take time and patience and discipline. Untreated obstructive sleep apnea when it is moderate to severe can have an adverse impact on cardiovascular health and raise her risk for heart disease, arrhythmias, hypertension, congestive heart failure, stroke and diabetes. Untreated obstructive sleep apnea causes sleep disruption, nonrestorative sleep, and sleep deprivation. This can have an impact on your day to day functioning and cause daytime sleepiness and impairment of cognitive function, memory loss, mood disturbance, and problems focussing. Using CPAP regularly can improve these symptoms.  Keep up the good work!   We can see you in one year.   Please make an appointment with your DME company, [facility], for a mask refit and explain your mask and headgear issues.

## 2017-08-11 ENCOUNTER — Encounter: Payer: Self-pay | Admitting: Family Medicine

## 2017-08-23 ENCOUNTER — Encounter: Payer: Self-pay | Admitting: Neurology

## 2017-08-23 ENCOUNTER — Encounter: Payer: Self-pay | Admitting: Family Medicine

## 2017-08-25 ENCOUNTER — Other Ambulatory Visit: Payer: Self-pay

## 2017-08-25 DIAGNOSIS — G40009 Localization-related (focal) (partial) idiopathic epilepsy and epileptic syndromes with seizures of localized onset, not intractable, without status epilepticus: Secondary | ICD-10-CM

## 2017-08-25 MED ORDER — LEVETIRACETAM 500 MG PO TABS: 500.0000 mg | ORAL_TABLET | Freq: Two times a day (BID) | ORAL | 0 refills | Status: DC

## 2017-08-26 ENCOUNTER — Telehealth: Payer: Self-pay | Admitting: Neurology

## 2017-08-26 DIAGNOSIS — G4733 Obstructive sleep apnea (adult) (pediatric): Secondary | ICD-10-CM

## 2017-08-26 DIAGNOSIS — Z9989 Dependence on other enabling machines and devices: Principal | ICD-10-CM

## 2017-08-26 NOTE — Telephone Encounter (Signed)
Please send order for new CPAP machine. Pt should be eligible, has been over 5 years.  Please see if she needs FU in 90 days, if so, will need to make appt for compl with NP or myself.

## 2017-08-26 NOTE — Telephone Encounter (Signed)
I have reached out to [facility] to discuss.

## 2017-09-01 NOTE — Telephone Encounter (Signed)
Received this notice from [facility]: "Since she has Medicare insurance they will require a 31-90 day follow up visit to continue to pay for the rental of the cpap and the supplies (even for replacement units)."

## 2017-09-01 NOTE — Telephone Encounter (Signed)
I called pt. I advised her of this information. She is agreeable to a new cpap start follow up on 11/12/17 at 1:00pm, check in at 12:30pm. Pt verbalized understanding of new appt date and time.

## 2017-09-03 ENCOUNTER — Other Ambulatory Visit: Payer: Self-pay

## 2017-09-03 ENCOUNTER — Encounter: Payer: Self-pay | Admitting: Neurology

## 2017-09-03 ENCOUNTER — Ambulatory Visit (INDEPENDENT_AMBULATORY_CARE_PROVIDER_SITE_OTHER): Payer: Medicare Other | Admitting: Neurology

## 2017-09-03 DIAGNOSIS — G40009 Localization-related (focal) (partial) idiopathic epilepsy and epileptic syndromes with seizures of localized onset, not intractable, without status epilepticus: Secondary | ICD-10-CM | POA: Diagnosis not present

## 2017-09-03 MED ORDER — LEVETIRACETAM 500 MG PO TABS: 500.0000 mg | ORAL_TABLET | Freq: Two times a day (BID) | ORAL | 3 refills | Status: DC
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
## 2017-09-15 ENCOUNTER — Other Ambulatory Visit: Payer: Self-pay | Admitting: Family Medicine

## 2017-09-15 ENCOUNTER — Encounter: Payer: Self-pay | Admitting: Family Medicine

## 2017-09-15 DIAGNOSIS — E039 Hypothyroidism, unspecified: Secondary | ICD-10-CM

## 2017-09-16 ENCOUNTER — Other Ambulatory Visit (INDEPENDENT_AMBULATORY_CARE_PROVIDER_SITE_OTHER): Payer: Medicare Other

## 2017-09-16 DIAGNOSIS — E119 Type 2 diabetes mellitus without complications: Secondary | ICD-10-CM

## 2017-09-16 DIAGNOSIS — E039 Hypothyroidism, unspecified: Secondary | ICD-10-CM | POA: Diagnosis not present

## 2017-09-16 DIAGNOSIS — E782 Mixed hyperlipidemia: Secondary | ICD-10-CM

## 2017-09-16 LAB — LIPID PANEL
CHOL/HDL RATIO: 4
CHOLESTEROL: 204 mg/dL — AB (ref 0–200)
HDL: 51.5 mg/dL (ref >39.00)
NonHDL: 152.4
Triglycerides: 214 mg/dL — ABNORMAL HIGH (ref 0.0–149.0)
VLDL: 42.8 mg/dL — AB (ref 0.0–40.0)

## 2017-09-16 LAB — TSH: TSH: 2.85 u[IU]/mL (ref 0.35–4.50)

## 2017-09-16 LAB — LDL CHOLESTEROL, DIRECT: LDL DIRECT: 123 mg/dL

## 2017-09-16 LAB — HEMOGLOBIN A1C: Hgb A1c MFr Bld: 5.2 % (ref 4.6–6.5)

## 2017-09-16 LAB — T4, FREE: Free T4: 0.61 ng/dL (ref 0.60–1.60)

## 2017-09-17 ENCOUNTER — Encounter: Payer: Self-pay | Admitting: Family Medicine

## 2017-09-17 ENCOUNTER — Ambulatory Visit (INDEPENDENT_AMBULATORY_CARE_PROVIDER_SITE_OTHER): Payer: Medicare Other | Admitting: Family Medicine

## 2017-09-17 VITALS — BP 148/78 | HR 69 | Temp 98.2°F | Ht 63.0 in | Wt 197.0 lb

## 2017-09-17 DIAGNOSIS — F4321 Adjustment disorder with depressed mood: Secondary | ICD-10-CM

## 2017-09-17 DIAGNOSIS — Z23 Encounter for immunization: Secondary | ICD-10-CM

## 2017-09-17 DIAGNOSIS — E039 Hypothyroidism, unspecified: Secondary | ICD-10-CM | POA: Diagnosis not present

## 2017-09-17 LAB — T3: T3 TOTAL: 94 ng/dL (ref 76–181)

## 2017-09-17 MED ORDER — LEVOTHYROXINE SODIUM 50 MCG PO TABS: 50.0000 ug | ORAL_TABLET | Freq: Every day | ORAL | 3 refills | Status: DC

## 2017-09-17 NOTE — Patient Instructions (Signed)
Look at the [employer] for [facility]- classes, etc  Look at [email or web address]  Follow up in 3 months

## 2017-09-17 NOTE — Progress Notes (Signed)
Subjective:    Patient ID: Jennifer [name], female    DOB: 1950-11-10, 67 y.o.   MRN: [id number]  HPI This is a 67 yo female who presents today with increased fatigue and weight gain. Started around the time she retired a couple of months ago. Had lost to 180 with weight watchers, has gained back to 197.  Was last here 03/24/17. Retired in June and weight stayed at 188 for a long time. Working out 3 days a week, still working a few hours a week for [employer]. Has traveled some since retirement, not following diet as carefully.  Little schedule to days, usually goes to gym, watches TV, occasional socializing. Goes to church on Sundays, not otherwise active with church. Has been considering helping a dog rescue group with transportation of dogs. Has had a contact number for weeks but hasn't called. Little motivation.   Past Medical History:  Diagnosis Date  . Allergy   . Anxiety   . Depression   . Dysrhythmia    FLB - ? PAC'S COMES AND GOES  . High cholesterol   . Hx of adenomatous polyp of colon    2012 - [facility]  . Hyperlipidemia   . Hypertension   . Osteoarthritis   . PLMD (periodic limb movement disorder) 04/24/2012  . Seizures ([place])    08/29/13  . Sleep apnea    wears CPAP   Past Surgical History:  Procedure Laterality Date  . APPENDECTOMY    . CESAREAN SECTION    . CHOLECYSTECTOMY  1978  . COLONOSCOPY    . DILATION AND CURETTAGE OF UTERUS    . JOINT REPLACEMENT  1998   right knee  . OSTEOTOMY PROXIMAL FEMORAL    . 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   Procedure: TOTAL HIP ARTHROPLASTY ANTERIOR APPROACH;  Surgeon: Frederik Pear, MD;  Location: [facility];  Service: Orthopedics;  Laterality: Right;   Family History  Problem Relation Age of Onset  . Cancer Father 59  . Esophageal cancer Father   . Heart disease Mother 4       Aortic valve disease  . Atrial fibrillation Brother   . Sleep apnea Brother   . Sleep apnea Sister   . Sleep apnea Sister   . Stomach  cancer Maternal Grandmother   . Colon cancer Neg Hx   . Rectal cancer Neg Hx    Social History   Tobacco Use  . Smoking status: Never Smoker  . Smokeless tobacco: Never Used  Substance Use Topics  . Alcohol use: No    Alcohol/week: 0.0 standard drinks  . Drug use: No      Review of Systems Per HPI    Objective:   Physical Exam Physical Exam  Constitutional: Oriented to person, place, and time. She appears well-developed and well-nourished.  HENT:  Head: Normocephalic and atraumatic.  Eyes: Conjunctivae are normal.  Neck: Normal range of motion. Neck supple.  Cardiovascular: Normal rate, regular rhythm and normal heart sounds.   Pulmonary/Chest: Effort normal and breath sounds normal.  Musculoskeletal: Normal range of motion.  Neurological: Alert and oriented to person, place, and time.  Skin: Skin is warm and dry.  Psychiatric: Normal mood and affect. Behavior is normal. Judgment and thought content normal.  Vitals reviewed.     BP (!) 148/78 (BP Location: Right Arm, Patient Position: Sitting, Cuff Size: Normal)   Pulse 69   Temp 98.2 F (36.8 C) (Oral)   Ht 5\' 3"  (1.6 m)  Wt 197 lb (89.4 kg)   SpO2 94%   BMI 34.90 kg/m  Wt Readings from Last 3 Encounters:  09/17/17 197 lb (89.4 kg)  09/03/17 194 lb (88 kg)  08/04/17 194 lb (88 kg)   BP Readings from Last 3 Encounters:  09/17/17 (!) 148/78  09/03/17 (!) 142/76  08/04/17 (!) 157/72       Assessment & Plan:  1. Adjustment disorder with depressed mood - Reviewed recent labs (normal TSH, free T4 and T3) - Discussed recent life change, lack of daily structure, stimulation and socialization - Discussed and encouraged regular sleep/wake times, healthy food choices, increased exercise types, volunteer and social activities - follow up in 3 months, sooner if worsening symptoms  2. Acquired hypothyroidism - continue current dose of levothyroxine - levothyroxine (SYNTHROID, LEVOTHROID) 50 MCG tablet; Take 1  tablet (50 mcg total) by mouth daily.  Dispense: 90 tablet; Refill: 3  3. Need for influenza vaccination - Flu vaccine HIGH DOSE PF  Over 30 minutes were spent face-to-face with the patient during this encounter and >50% of that time was spent on counseling and coordination of care  [name], FNP-BC  [facility], [place]  09/21/2017 10:35 AM

## 2017-09-21 ENCOUNTER — Encounter: Payer: Self-pay | Admitting: Family Medicine

## 2017-09-24 ENCOUNTER — Ambulatory Visit: Payer: BLUE CROSS/BLUE SHIELD | Admitting: Family Medicine

## 2017-09-24 ENCOUNTER — Ambulatory Visit: Payer: PRIVATE HEALTH INSURANCE | Admitting: Family Medicine

## 2017-10-06 ENCOUNTER — Encounter: Payer: Self-pay | Admitting: Family Medicine

## 2017-10-06 ENCOUNTER — Other Ambulatory Visit: Payer: Self-pay | Admitting: Emergency Medicine

## 2017-10-06 DIAGNOSIS — I10 Essential (primary) hypertension: Secondary | ICD-10-CM

## 2017-10-06 DIAGNOSIS — E119 Type 2 diabetes mellitus without complications: Secondary | ICD-10-CM

## 2017-10-06 MED ORDER — LOSARTAN POTASSIUM 50 MG PO TABS: 50.0000 mg | ORAL_TABLET | Freq: Every day | ORAL | 3 refills | Status: DC

## 2017-10-16 ENCOUNTER — Encounter: Payer: Self-pay | Admitting: Family Medicine

## 2017-10-16 ENCOUNTER — Other Ambulatory Visit: Payer: Self-pay | Admitting: Family Medicine

## 2017-10-16 DIAGNOSIS — F3289 Other specified depressive episodes: Secondary | ICD-10-CM

## 2017-10-16 MED ORDER — CITALOPRAM HYDROBROMIDE 40 MG PO TABS: ORAL_TABLET | ORAL | 1 refills | Status: DC

## 2017-11-05 ENCOUNTER — Other Ambulatory Visit: Payer: Self-pay | Admitting: Emergency Medicine

## 2017-11-05 ENCOUNTER — Encounter: Payer: Self-pay | Admitting: Family Medicine

## 2017-11-05 DIAGNOSIS — E782 Mixed hyperlipidemia: Secondary | ICD-10-CM

## 2017-11-05 DIAGNOSIS — E119 Type 2 diabetes mellitus without complications: Secondary | ICD-10-CM

## 2017-11-05 MED ORDER — PRAVASTATIN SODIUM 80 MG PO TABS: 80.0000 mg | ORAL_TABLET | Freq: Every day | ORAL | 1 refills | Status: DC

## 2017-11-10 ENCOUNTER — Encounter: Payer: Self-pay | Admitting: Neurology

## 2017-11-12 ENCOUNTER — Ambulatory Visit (INDEPENDENT_AMBULATORY_CARE_PROVIDER_SITE_OTHER): Payer: Medicare Other | Admitting: Neurology

## 2017-11-12 ENCOUNTER — Encounter: Payer: Self-pay | Admitting: Neurology

## 2017-11-12 VITALS — BP 153/74 | HR 62 | Ht 64.5 in | Wt 202.0 lb

## 2017-11-12 DIAGNOSIS — Z9989 Dependence on other enabling machines and devices: Secondary | ICD-10-CM

## 2017-11-12 DIAGNOSIS — G4733 Obstructive sleep apnea (adult) (pediatric): Secondary | ICD-10-CM | POA: Diagnosis not present

## 2017-11-12 NOTE — Patient Instructions (Addendum)
You can increase the humidity level on your machine and it may help to use the room humdifier too.   I will reduce the pressure to 9 cm, please call or better yet, email [place] through My Chart in about 6 weeks, so we can look at another download to see if the leak is better, and it may improve your mouth dryness.   Keep up the good work! You are fully compliant with your new CPAP!

## 2017-11-12 NOTE — Progress Notes (Signed)
Order for pressure change sent to [facility] via community message in EP[employer]. Confirmation received that the order transmitted was successful.
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
## 2017-12-17 ENCOUNTER — Encounter: Payer: Self-pay | Admitting: Family Medicine

## 2017-12-17 ENCOUNTER — Ambulatory Visit (INDEPENDENT_AMBULATORY_CARE_PROVIDER_SITE_OTHER): Payer: Medicare Other | Admitting: Family Medicine

## 2017-12-17 VITALS — BP 124/64 | HR 100 | Temp 97.4°F | Ht 64.5 in | Wt 205.4 lb

## 2017-12-17 DIAGNOSIS — Z6834 Body mass index (BMI) 34.0-34.9, adult: Secondary | ICD-10-CM

## 2017-12-17 DIAGNOSIS — E6609 Other obesity due to excess calories: Secondary | ICD-10-CM

## 2017-12-17 DIAGNOSIS — F329 Major depressive disorder, single episode, unspecified: Secondary | ICD-10-CM | POA: Diagnosis not present

## 2017-12-17 DIAGNOSIS — K5909 Other constipation: Secondary | ICD-10-CM

## 2017-12-17 NOTE — Patient Instructions (Signed)
Good to see you today  Increase your Celexa to 1 tablet daily- I'll send in a new prescription  Follow up in 3 months

## 2017-12-17 NOTE — Progress Notes (Signed)
Subjective:    Patient ID: Jennifer [name], female    DOB: 1950-12-26, 67 y.o.   MRN: [id number]  HPI This is a 67 yo female who presents today for follow up of adjustment disorder, hypothyroidism, HTN.  She continues to struggle with depression since retirement.  Feels like a failure with her recent weight gain.  Feels like she has lost her purpose and now has more time to contemplate the things that are bothering her.  Has a lot of things that she would like to do but has motivation getting things [name].  Enjoys going to the gym 3 days a week for Silver sneakers and traveling with her family.  But on a day-to-day basis finds it difficult to be motivated to exercise and make healthy food choices.  ROS- she denies chest pain, shortness of breath, lower extremity edema.  Chronic constipation relieved with over-the-counter treatment.  Admits to not drinking enough water or eating enough fiber.  Past Medical History:  Diagnosis Date  . Allergy   . Anxiety   . Depression   . Dysrhythmia    FLB - ? PAC'S COMES AND GOES  . High cholesterol   . Hx of adenomatous polyp of colon    2012 - [facility]  . Hyperlipidemia   . Hypertension   . Osteoarthritis   . PLMD (periodic limb movement disorder) 04/24/2012  . Seizures ([place])    08/29/13  . Sleep apnea    wears CPAP   Past Surgical History:  Procedure Laterality Date  . APPENDECTOMY    . CESAREAN SECTION    . CHOLECYSTECTOMY  1978  . COLONOSCOPY    . DILATION AND CURETTAGE OF UTERUS    . JOINT REPLACEMENT  1998   right knee  . OSTEOTOMY PROXIMAL FEMORAL    . 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   Procedure: TOTAL HIP ARTHROPLASTY ANTERIOR APPROACH;  Surgeon: Frederik Pear, MD;  Location: [facility];  Service: Orthopedics;  Laterality: Right;   Family History  Problem Relation Age of Onset  . Cancer Father 52  . Esophageal cancer Father   . Heart disease Mother 56       Aortic valve disease  . Atrial fibrillation Brother   . Sleep  apnea Brother   . Sleep apnea Sister   . Sleep apnea Sister   . Stomach cancer Maternal Grandmother   . Colon cancer Neg Hx   . Rectal cancer Neg Hx    Social History   Tobacco Use  . Smoking status: Never Smoker  . Smokeless tobacco: Never Used  Substance Use Topics  . Alcohol use: No    Alcohol/week: 0.0 standard drinks  . Drug use: No      Review of Systems Per HPI    Objective:   Physical Exam Physical Exam  Constitutional: Oriented to person, place, and time. She appears well-developed and well-nourished.  HENT:  Head: Normocephalic and atraumatic.  Eyes: Conjunctivae are normal.  Neck: Normal range of motion. Neck supple.  Cardiovascular: Normal rate, regular rhythm and normal heart sounds.   Pulmonary/Chest: Effort normal and breath sounds normal.  Musculoskeletal: No LE edema.  Neurological: Alert and oriented to person, place, and time.  Skin: Skin is warm and dry.  Psychiatric: Normal mood and affect. Behavior is normal. Judgment and thought content normal.  Vitals reviewed.     BP 124/64 (BP Location: Right Arm, Patient Position: Sitting, Cuff Size: Normal)   Pulse 100   Temp (!) 97.4 F (  36.3 C) (Oral)   Ht 5' 4.5" (1.638 m)   Wt 205 lb 6.4 oz (93.2 kg)   SpO2 97%   BMI 34.71 kg/m  Wt Readings from Last 3 Encounters:  12/17/17 205 lb 6.4 oz (93.2 kg)  11/12/17 202 lb (91.6 kg)  09/17/17 197 lb (89.4 kg)       Assessment & Plan:  1. Reactive depression -Wiill increase her citalopram from 20 mg to 40 mg -Encouraged her to schedule appointment for counseling and she is agreeable - citalopram (CELEXA) 40 MG tablet; Take 1 tablet (40 mg total) by mouth daily.  Dispense: 90 tablet; Refill: 1  2. Chronic constipation -Currently well managed on over-the-counter treatments, encouraged her to increase water and fiber  3. Class 1 obesity due to excess calories without serious comorbidity with body mass index (BMI) of 34.0 to 34.9 in  adult -Discussed her barriers to making healthy food choices.  She feels that she knows what to eat but struggles with choosing wisely.  I suspect this is greatly influenced by her mood and we discussed this.  She is going to add water aerobics to her exercise regimen.  - follow up in 3 months, sooner if worsening mood  Over 30 minutes were spent face-to-face with the patient during this encounter and >50% of that time was spent on counseling and coordination of care    [name], FNP-BC  [place] [facility], [place]  12/20/2017 10:22 AM

## 2017-12-20 ENCOUNTER — Encounter: Payer: Self-pay | Admitting: Family Medicine

## 2017-12-20 DIAGNOSIS — K5909 Other constipation: Secondary | ICD-10-CM | POA: Insufficient documentation

## 2017-12-20 DIAGNOSIS — Z6834 Body mass index (BMI) 34.0-34.9, adult: Secondary | ICD-10-CM

## 2017-12-20 DIAGNOSIS — E6609 Other obesity due to excess calories: Secondary | ICD-10-CM | POA: Insufficient documentation

## 2017-12-20 MED ORDER — CITALOPRAM HYDROBROMIDE 40 MG PO TABS: 40.0000 mg | ORAL_TABLET | Freq: Every day | ORAL | 1 refills | Status: DC

## 2017-12-25 ENCOUNTER — Other Ambulatory Visit: Payer: Self-pay | Admitting: Emergency Medicine

## 2017-12-25 ENCOUNTER — Encounter: Payer: Self-pay | Admitting: Family Medicine

## 2017-12-25 DIAGNOSIS — F329 Major depressive disorder, single episode, unspecified: Secondary | ICD-10-CM

## 2017-12-25 MED ORDER — CITALOPRAM HYDROBROMIDE 40 MG PO TABS: 40.0000 mg | ORAL_TABLET | Freq: Every day | ORAL | 1 refills | Status: DC

## 2017-12-30 DIAGNOSIS — N3000 Acute cystitis without hematuria: Secondary | ICD-10-CM | POA: Diagnosis not present

## 2018-01-12 ENCOUNTER — Ambulatory Visit: Payer: Medicare Other | Admitting: Psychology

## 2018-02-03 ENCOUNTER — Ambulatory Visit (INDEPENDENT_AMBULATORY_CARE_PROVIDER_SITE_OTHER): Payer: Medicare Other | Admitting: Psychology

## 2018-02-03 DIAGNOSIS — F320 Major depressive disorder, single episode, mild: Secondary | ICD-10-CM

## 2018-02-17 ENCOUNTER — Ambulatory Visit (INDEPENDENT_AMBULATORY_CARE_PROVIDER_SITE_OTHER): Payer: Medicare Other | Admitting: Psychology

## 2018-02-17 DIAGNOSIS — F320 Major depressive disorder, single episode, mild: Secondary | ICD-10-CM

## 2018-02-25 DIAGNOSIS — L821 Other seborrheic keratosis: Secondary | ICD-10-CM | POA: Diagnosis not present

## 2018-02-25 DIAGNOSIS — L570 Actinic keratosis: Secondary | ICD-10-CM | POA: Diagnosis not present

## 2018-02-25 DIAGNOSIS — L219 Seborrheic dermatitis, unspecified: Secondary | ICD-10-CM | POA: Diagnosis not present

## 2018-02-25 DIAGNOSIS — D2272 Melanocytic nevi of left lower limb, including hip: Secondary | ICD-10-CM | POA: Diagnosis not present

## 2018-02-25 DIAGNOSIS — D2239 Melanocytic nevi of other parts of face: Secondary | ICD-10-CM | POA: Diagnosis not present

## 2018-02-25 DIAGNOSIS — D2271 Melanocytic nevi of right lower limb, including hip: Secondary | ICD-10-CM | POA: Diagnosis not present

## 2018-02-25 DIAGNOSIS — Z23 Encounter for immunization: Secondary | ICD-10-CM | POA: Diagnosis not present

## 2018-02-25 DIAGNOSIS — L814 Other melanin hyperpigmentation: Secondary | ICD-10-CM | POA: Diagnosis not present

## 2018-02-25 DIAGNOSIS — D225 Melanocytic nevi of trunk: Secondary | ICD-10-CM | POA: Diagnosis not present

## 2018-02-25 DIAGNOSIS — D485 Neoplasm of uncertain behavior of skin: Secondary | ICD-10-CM | POA: Diagnosis not present

## 2018-03-03 ENCOUNTER — Ambulatory Visit (INDEPENDENT_AMBULATORY_CARE_PROVIDER_SITE_OTHER): Payer: Medicare Other | Admitting: Psychology

## 2018-03-03 DIAGNOSIS — F320 Major depressive disorder, single episode, mild: Secondary | ICD-10-CM | POA: Diagnosis not present

## 2018-03-18 ENCOUNTER — Ambulatory Visit: Payer: Medicare Other | Admitting: Family Medicine

## 2018-03-23 ENCOUNTER — Encounter: Payer: Self-pay | Admitting: Family Medicine

## 2018-03-23 ENCOUNTER — Telehealth: Payer: Self-pay | Admitting: Family Medicine

## 2018-03-23 ENCOUNTER — Ambulatory Visit: Payer: Medicare Other | Admitting: Family Medicine

## 2018-03-23 NOTE — Telephone Encounter (Signed)
Called and spoke with patient to let her know that she can defer visit today due to COVID 19. She is doing well, no concerns today. Knee is bothering her and she has an appointment with orthopedics tomorrow. She requests information for [facility] weight loss. Will send through mychart.

## 2018-03-26 ENCOUNTER — Other Ambulatory Visit: Payer: Self-pay

## 2018-03-26 ENCOUNTER — Ambulatory Visit (INDEPENDENT_AMBULATORY_CARE_PROVIDER_SITE_OTHER): Payer: Medicare Other | Admitting: Psychology

## 2018-03-26 DIAGNOSIS — F320 Major depressive disorder, single episode, mild: Secondary | ICD-10-CM

## 2018-04-08 ENCOUNTER — Ambulatory Visit: Payer: PRIVATE HEALTH INSURANCE | Admitting: Psychology

## 2018-04-10 ENCOUNTER — Encounter: Payer: Self-pay | Admitting: Family Medicine

## 2018-04-10 ENCOUNTER — Ambulatory Visit: Payer: PRIVATE HEALTH INSURANCE | Admitting: Psychology

## 2018-04-23 ENCOUNTER — Ambulatory Visit (INDEPENDENT_AMBULATORY_CARE_PROVIDER_SITE_OTHER): Payer: Medicare Other | Admitting: Psychology

## 2018-04-23 DIAGNOSIS — F320 Major depressive disorder, single episode, mild: Secondary | ICD-10-CM | POA: Diagnosis not present

## 2018-04-29 ENCOUNTER — Other Ambulatory Visit: Payer: Self-pay | Admitting: Family Medicine

## 2018-04-29 DIAGNOSIS — E119 Type 2 diabetes mellitus without complications: Secondary | ICD-10-CM

## 2018-04-29 DIAGNOSIS — E039 Hypothyroidism, unspecified: Secondary | ICD-10-CM

## 2018-04-29 DIAGNOSIS — E782 Mixed hyperlipidemia: Secondary | ICD-10-CM

## 2018-04-29 NOTE — Telephone Encounter (Signed)
Please see if she can come for fasting labs and have virtual visit.

## 2018-04-29 NOTE — Telephone Encounter (Signed)
Refill request came over for Pravastatin. Before refilling it wanted to see if patient needs to have a virtual visit to follow up?

## 2018-04-29 NOTE — Telephone Encounter (Signed)
Left message for patient to call back to discuss.

## 2018-04-29 NOTE — Telephone Encounter (Signed)
Patient returned Jennifer [name]'s call.  Patient can be reached at [phone].

## 2018-04-29 NOTE — Telephone Encounter (Signed)
Orders placed for labs. OK to have drawn at [facility].

## 2018-04-29 NOTE — Telephone Encounter (Signed)
Patient advised. Appointments made. Patient is getting labs done at [facility] office because it is closer for her. I am not sure if for that office you would need to put orders ahead of time. Thank you.

## 2018-05-04 ENCOUNTER — Other Ambulatory Visit: Payer: Self-pay

## 2018-05-04 ENCOUNTER — Other Ambulatory Visit (INDEPENDENT_AMBULATORY_CARE_PROVIDER_SITE_OTHER): Payer: Medicare Other

## 2018-05-04 DIAGNOSIS — E782 Mixed hyperlipidemia: Secondary | ICD-10-CM

## 2018-05-04 DIAGNOSIS — E039 Hypothyroidism, unspecified: Secondary | ICD-10-CM | POA: Diagnosis not present

## 2018-05-04 LAB — COMPREHENSIVE METABOLIC PANEL
ALT: 21 U/L (ref 0–35)
AST: 18 U/L (ref 0–37)
Albumin: 4.3 g/dL (ref 3.5–5.2)
Alkaline Phosphatase: 77 U/L (ref 39–117)
BUN: 23 mg/dL (ref 6–23)
CO2: 29 mEq/L (ref 19–32)
Calcium: 9.8 mg/dL (ref 8.4–10.5)
Chloride: 101 mEq/L (ref 96–112)
Creatinine, Ser: 0.73 mg/dL (ref 0.40–1.20)
GFR: 79.46 mL/min (ref >60.00)
Glucose, Bld: 86 mg/dL (ref 70–99)
Potassium: 4.3 mEq/L (ref 3.5–5.1)
Sodium: 138 mEq/L (ref 135–145)
Total Bilirubin: 0.4 mg/dL (ref 0.2–1.2)
Total Protein: 7.2 g/dL (ref 6.0–8.3)

## 2018-05-04 LAB — LIPID PANEL
Cholesterol: 203 mg/dL — ABNORMAL HIGH (ref 0–200)
HDL: 40 mg/dL (ref >39.00)
NonHDL: 163.48
Total CHOL/HDL Ratio: 5
Triglycerides: 392 mg/dL — ABNORMAL HIGH (ref 0.0–149.0)
VLDL: 78.4 mg/dL — ABNORMAL HIGH (ref 0.0–40.0)

## 2018-05-04 LAB — TSH: TSH: 2.27 u[IU]/mL (ref 0.35–4.50)

## 2018-05-04 LAB — LDL CHOLESTEROL, DIRECT: Direct LDL: 96 mg/dL

## 2018-05-06 ENCOUNTER — Encounter: Payer: Self-pay | Admitting: Family Medicine

## 2018-05-06 ENCOUNTER — Ambulatory Visit (INDEPENDENT_AMBULATORY_CARE_PROVIDER_SITE_OTHER): Payer: Medicare Other | Admitting: Family Medicine

## 2018-05-06 VITALS — Ht 64.5 in | Wt 207.0 lb

## 2018-05-06 DIAGNOSIS — Z6834 Body mass index (BMI) 34.0-34.9, adult: Secondary | ICD-10-CM | POA: Diagnosis not present

## 2018-05-06 DIAGNOSIS — F329 Major depressive disorder, single episode, unspecified: Secondary | ICD-10-CM | POA: Diagnosis not present

## 2018-05-06 DIAGNOSIS — K5909 Other constipation: Secondary | ICD-10-CM | POA: Diagnosis not present

## 2018-05-06 DIAGNOSIS — E6609 Other obesity due to excess calories: Secondary | ICD-10-CM | POA: Diagnosis not present

## 2018-05-06 DIAGNOSIS — K219 Gastro-esophageal reflux disease without esophagitis: Secondary | ICD-10-CM | POA: Diagnosis not present

## 2018-05-06 NOTE — Progress Notes (Signed)
Virtual Visit via Video Note  I connected with Jennifer [name] on 05/06/18 at  9:00 AM EDT by a video enabled telemedicine application and verified that I am speaking with the correct person using two identifiers.  The patient was in her home and I was in my office at [facility].   I discussed the limitations of evaluation and management by telemedicine and the availability of in person appointments. The patient expressed understanding and agreed to proceed.  History of Present Illness: This is a 68 year old female who is following up on chronic medical conditions.  She had lab work performed prior to virtual visit.  Hyperlipidemia- her LDL has decreased that her HDL has decreased as well.  She continues on pravastatin 80 mg daily.  She reports quite a bit of dietary indiscretion and suspects that she has gained weight recently.  With COVID-19 and her gym being closed she has not been exercising regularly.  She has been eating more simple carbohydrates than usual.   Chronic constipation- dietary fiber has fallen off and she has not been taking MiraLAX.  She did have good results with that in the past.  GERD- she has noticed increased heartburn, 2-3 times a week.  She gets good relief taking 2 Tums.  She does drink 3-5 bottles of diet soda daily and choose not to go home regularly.  She noticed symptoms are worse if she eats too close to bedtime.  Depression- she has been doing counseling with [name] and this has continued virtually.  She finds this very helpful.  She states that she does not feel her mood is worse, just that she is lacking in motivation and routine.  Left knee pain-been seen by orthopedics and needs her knee replaced.  She does not regularly take medication but gets good relief if she does.  This does interfere with her ability to exercise.  Observations/Objective: The patient is alert and answers questions appropriately.  Visible skin is unremarkable.  She is  normally conversive without any shortness of breath.  Her mood and affect are appropriate. Ht 5' 4.5" (1.638 m)   Wt 207 lb (93.9 kg) Comment: per patient  BMI 34.98 kg/m  Wt Readings from Last 3 Encounters:  05/06/18 207 lb (93.9 kg)  12/17/17 205 lb 6.4 oz (93.2 kg)  11/12/17 202 lb (91.6 kg)    Assessment and Plan: 1. Chronic constipation -Discussed safety of using MiraLAX every day.  Discussed increasing fiber intake.  2. Class 1 obesity due to excess calories without serious comorbidity with body mass index (BMI) of 34.0 to 34.9 in adult -Following a weight watchers eating plan is work for patient in the past and we discussed going back to similar eating plan with a diet of mostly vegetables, lean proteins and whole grains.  3. Reactive depression -Better on citalopram 40 mg.  Encouraged her to continue socialization as much as possible even remotely.  Increase exercise as able.  4. GERD -Likely exacerbated by recent weight gain, high caffeine intake and frequent use of mint. -Information sent via my chart regarding trigger avoidance and treatments   [name], FNP-BC  [facility] [place] [facility]  05/06/2018 11:46 AM   Follow Up Instructions: Recap of visit and instructions sent to patient via my chart.   I discussed the assessment and treatment plan with the patient. The patient was provided an opportunity to ask questions and all were answered. The patient agreed with the plan  and demonstrated an understanding of the instructions.   The patient was advised to call back or seek an in-person evaluation if the symptoms worsen or if the condition fails to improve as anticipated.   Elby Beck, FNP

## 2018-05-07 ENCOUNTER — Ambulatory Visit (INDEPENDENT_AMBULATORY_CARE_PROVIDER_SITE_OTHER): Payer: Medicare Other | Admitting: Psychology

## 2018-05-07 DIAGNOSIS — F320 Major depressive disorder, single episode, mild: Secondary | ICD-10-CM

## 2018-05-19 DIAGNOSIS — M25462 Effusion, left knee: Secondary | ICD-10-CM | POA: Diagnosis not present

## 2018-05-27 ENCOUNTER — Ambulatory Visit: Payer: PRIVATE HEALTH INSURANCE | Admitting: Psychology

## 2018-06-10 ENCOUNTER — Ambulatory Visit (INDEPENDENT_AMBULATORY_CARE_PROVIDER_SITE_OTHER): Payer: Medicare Other | Admitting: Psychology

## 2018-06-10 DIAGNOSIS — F320 Major depressive disorder, single episode, mild: Secondary | ICD-10-CM

## 2018-06-16 ENCOUNTER — Other Ambulatory Visit: Payer: Self-pay | Admitting: Orthopedic Surgery

## 2018-06-24 ENCOUNTER — Ambulatory Visit (INDEPENDENT_AMBULATORY_CARE_PROVIDER_SITE_OTHER): Payer: Medicare Other | Admitting: Psychology

## 2018-06-24 DIAGNOSIS — F320 Major depressive disorder, single episode, mild: Secondary | ICD-10-CM | POA: Diagnosis not present

## 2018-07-01 ENCOUNTER — Other Ambulatory Visit: Payer: Self-pay | Admitting: Family Medicine

## 2018-07-01 DIAGNOSIS — F329 Major depressive disorder, single episode, unspecified: Secondary | ICD-10-CM

## 2018-07-08 ENCOUNTER — Ambulatory Visit (INDEPENDENT_AMBULATORY_CARE_PROVIDER_SITE_OTHER): Payer: Medicare Other | Admitting: Psychology

## 2018-07-08 DIAGNOSIS — F320 Major depressive disorder, single episode, mild: Secondary | ICD-10-CM | POA: Diagnosis not present

## 2018-07-14 NOTE — Patient Instructions (Addendum)
YOU NEED TO HAVE A COVID 19 TEST ON 07-16-18 @ 3:20 PM, THIS TEST MUST BE DONE BEFORE SURGERY, COME TO [place] ENTRANCE. ONCE YOUR COVID TEST IS COMPLETED, PLEASE BEGIN THE QUARANTINE INSTRUCTIONS AS OUTLINED IN YOUR HANDOUT.                Jennifer [name]  07/14/2018   Your procedure is scheduled on: 07-20-18   Report to [facility] Main  Entrance       Report to admitting at  08:40 AM    Call this number if you have problems the morning of surgery [phone]    Remember: NO SOLID FOOD AFTER MIDNIGHT THE NIGHT PRIOR TO SURGERY. NOTHING BY MOUTH EXCEPT CLEAR LIQUIDS UNTIL 3 HOURS PRIOR TO SCHEDULED SURGERY. PLEASE FINISH ENSURE DRINK PER SURGEON ORDER 3 HOURS PRIOR TO SCHEDULED SURGERY TIME WHICH NEEDS TO BE COMPLETED AT 08:10 AM. CLEAR LIQUID DIET   Foods Allowed                                                                     Foods Excluded  Coffee and tea, regular and decaf                             liquids that you cannot  Plain Jell-O in any flavor                                             see through such as: Fruit ices (not with fruit pulp)                                     milk, soups, orange juice  Iced Popsicles                                    All solid food Carbonated beverages, regular and diet                                    Cranberry, grape and apple juices Sports drinks like Gatorade Lightly seasoned clear broth or consume(fat free) Sugar, honey syrup  Sample Menu Breakfast                                Lunch                                     Supper Cranberry juice                    Beef broth                            Chicken broth  Jell-O                                     Grape juice                           Apple juice Coffee or tea                        Jell-O                                      Popsicle                                                Coffee or tea                        Coffee  or tea  _____________________________________________________________________     Take these medicines the morning of surgery with A SIP OF WATER: Citalopram (Celexa), Levetiracetam (Keppra), and Levothyroxine (Synthroid). You may also use and bring your eyedrops as needed.   BRUSH YOUR TEETH MORNING OF SURGERY AND RINSE YOUR MOUTH OUT, NO CHEWING GUM CANDY OR MINTS.                               You may not have any metal on your body including hair pins and              piercings     Do not wear jewelry, make-up, lotions, powders or perfumes, deodorant               Do not wear nail polish.  Do not shave  48 hours prior to surgery.         Do not bring valuables to the hospital. [place].  Contacts, dentures or bridgework may not be worn into surgery.                  Please read over the following fact sheets you were given: ____________________________________________________________________          [facility] - Preparing for Surgery Before surgery, you can play an important role.  Because skin is not sterile, your skin needs to be as free of germs as possible.  You can reduce the number of germs on your skin by washing with CHG (chlorahexidine gluconate) soap before surgery.  CHG is an antiseptic cleaner which kills germs and bonds with the skin to continue killing germs even after washing. Please DO NOT use if you have an allergy to CHG or antibacterial soaps.  If your skin becomes reddened/irritated stop using the CHG and inform your nurse when you arrive at Short Stay. Do not shave (including legs and underarms) for at least 48 hours prior to the first CHG shower.  You may shave your face/neck. Please follow these instructions carefully:  1.  Shower with CHG Soap the night before surgery and the  morning of Surgery.  2.  If you choose to wash your hair, wash your hair first as usual with your  normal  shampoo.  3.  After you  shampoo, rinse your hair and body thoroughly to remove the  shampoo.                           4.  Use CHG as you would any other liquid soap.  You can apply chg directly  to the skin and wash                       Gently with a scrungie or clean washcloth.  5.  Apply the CHG Soap to your body ONLY FROM THE NECK DOWN.   Do not use on face/ open                           Wound or open sores. Avoid contact with eyes, ears mouth and genitals (private parts).                       Wash face,  Genitals (private parts) with your normal soap.             6.  Wash thoroughly, paying special attention to the area where your surgery  will be performed.  7.  Thoroughly rinse your body with warm water from the neck down.  8.  DO NOT shower/wash with your normal soap after using and rinsing off  the CHG Soap.                9.  Pat yourself dry with a clean towel.            10.  Wear clean pajamas.            11.  Place clean sheets on your bed the night of your first shower and do not  sleep with pets. Day of Surgery : Do not apply any lotions/deodorants the morning of surgery.  Please wear clean clothes to the hospital/surgery center.  FAILURE TO FOLLOW THESE INSTRUCTIONS MAY RESULT IN THE CANCELLATION OF YOUR SURGERY PATIENT SIGNATURE_________________________________  NURSE SIGNATURE__________________________________  ________________________________________________________________________   Jennifer [name]  An incentive spirometer is a tool that can help keep your lungs clear and active. This tool measures how well you are filling your lungs with each breath. Taking long deep breaths may help reverse or decrease the chance of developing breathing (pulmonary) problems (especially infection) following:  A long period of time when you are unable to move or be active. BEFORE THE PROCEDURE   If the spirometer includes an indicator to show your best effort, your nurse or respiratory therapist  will set it to a desired goal.  If possible, sit up straight or lean slightly forward. Try not to slouch.  Hold the incentive spirometer in an upright position. INSTRUCTIONS FOR USE  1. Sit on the edge of your bed if possible, or sit up as far as you can in bed or on a chair. 2. Hold the incentive spirometer in an upright position. 3. Breathe out normally. 4. Place the mouthpiece in your mouth and seal your lips tightly around it. 5. Breathe in slowly and as deeply as possible, raising the piston or the ball toward the top of the column. 6. Hold your breath for 3-5 seconds or for as long as possible. Allow the piston  or ball to fall to the bottom of the column. 7. Remove the mouthpiece from your mouth and breathe out normally. 8. Rest for a few seconds and repeat Steps 1 through 7 at least 10 times every 1-2 hours when you are awake. Take your time and take a few normal breaths between deep breaths. 9. The spirometer may include an indicator to show your best effort. Use the indicator as a goal to work toward during each repetition. 10. After each set of 10 deep breaths, practice coughing to be sure your lungs are clear. If you have an incision (the cut made at the time of surgery), support your incision when coughing by placing a pillow or rolled up towels firmly against it. Once you are able to get out of bed, walk around indoors and cough well. You may stop using the incentive spirometer when instructed by your caregiver.  RISKS AND COMPLICATIONS  Take your time so you do not get dizzy or light-headed.  If you are in pain, you may need to take or ask for pain medication before doing incentive spirometry. It is harder to take a deep breath if you are having pain. AFTER USE  Rest and breathe slowly and easily.  It can be helpful to keep track of a log of your progress. Your caregiver can provide you with a simple table to help with this. If you are using the spirometer at home, follow  these instructions: [place] IF:   You are having difficultly using the spirometer.  You have trouble using the spirometer as often as instructed.  Your pain medication is not giving enough relief while using the spirometer.  You develop fever of 100.5 F (38.1 C) or higher. SEEK IMMEDIATE MEDICAL CARE IF:   You cough up bloody sputum that had not been present before.  You develop fever of 102 F (38.9 C) or greater.  You develop worsening pain at or near the incision site. MAKE SURE YOU:   Understand these instructions.  Will watch your condition.  Will get help right away if you are not doing well or get worse. Document Released: 05/06/2006 Document Revised: 03/18/2011 Document Reviewed: 07/07/2006 ExitCare Patient Information 2014 ExitCare, [place].   ________________________________________________________________________  WHAT IS A BLOOD TRANSFUSION? Blood Transfusion Information  A transfusion is the replacement of blood or some of its parts. Blood is made up of multiple cells which provide different functions.  Red blood cells carry oxygen and are used for blood loss replacement.  White blood cells fight against infection.  Platelets control bleeding.  Plasma helps clot blood.  Other blood products are available for specialized needs, such as hemophilia or other clotting disorders. BEFORE THE TRANSFUSION  Who gives blood for transfusions?   Healthy volunteers who are fully evaluated to make sure their blood is safe. This is blood bank blood. Transfusion therapy is the safest it has ever been in the practice of medicine. Before blood is taken from a donor, a complete history is taken to make sure that person has no history of diseases nor engages in risky social behavior (examples are intravenous drug use or sexual activity with multiple partners). The donor's travel history is screened to minimize risk of transmitting infections, such as malaria. The  donated blood is tested for signs of infectious diseases, such as HIV and hepatitis. The blood is then tested to be sure it is compatible with you in order to minimize the chance of a transfusion reaction. If you  or a relative donates blood, this is often done in anticipation of surgery and is not appropriate for emergency situations. It takes many days to process the donated blood. RISKS AND COMPLICATIONS Although transfusion therapy is very safe and saves many lives, the main dangers of transfusion include:   Getting an infectious disease.  Developing a transfusion reaction. This is an allergic reaction to something in the blood you were given. Every precaution is taken to prevent this. The decision to have a blood transfusion has been considered carefully by your caregiver before blood is given. Blood is not given unless the benefits outweigh the risks. AFTER THE TRANSFUSION  Right after receiving a blood transfusion, you will usually feel much better and more energetic. This is especially true if your red blood cells have gotten low (anemic). The transfusion raises the level of the red blood cells which carry oxygen, and this usually causes an energy increase.  The nurse administering the transfusion will monitor you carefully for complications. HOME CARE INSTRUCTIONS  No special instructions are needed after a transfusion. You may find your energy is better. Speak with your caregiver about any limitations on activity for underlying diseases you may have. SEEK MEDICAL CARE IF:   Your condition is not improving after your transfusion.  You develop redness or irritation at the intravenous (IV) site. SEEK IMMEDIATE MEDICAL CARE IF:  Any of the following symptoms occur over the next 12 hours:  Shaking chills.  You have a temperature by mouth above 102 F (38.9 C), not controlled by medicine.  Chest, back, or muscle pain.  People around you feel you are not acting correctly or are  confused.  Shortness of breath or difficulty breathing.  Dizziness and fainting.  You get a rash or develop hives.  You have a decrease in urine output.  Your urine turns a dark color or changes to pink, red, or brown. Any of the following symptoms occur over the next 10 days:  You have a temperature by mouth above 102 F (38.9 C), not controlled by medicine.  Shortness of breath.  Weakness after normal activity.  The white part of the eye turns yellow (jaundice).  You have a decrease in the amount of urine or are urinating less often.  Your urine turns a dark color or changes to pink, red, or brown. Document Released: 12/22/1999 Document Revised: 03/18/2011 Document Reviewed: 08/10/2007 [facility] Patient Information 2014 [place], [place].  _______________________________________________________________________

## 2018-07-15 NOTE — Progress Notes (Signed)
09-03-17 LOV w/Dr. Delice Lesch (Neurology) for seizures. Pt to f/u in 1 year

## 2018-07-16 ENCOUNTER — Other Ambulatory Visit: Payer: Self-pay

## 2018-07-16 ENCOUNTER — Encounter (HOSPITAL_COMMUNITY)
Admission: RE | Admit: 2018-07-16 | Discharge: 2018-07-16 | Disposition: A | Discharge status: Home / Self Care | Payer: Medicare Other | Source: Ambulatory Visit | Attending: Orthopedic Surgery | Admitting: Orthopedic Surgery

## 2018-07-16 ENCOUNTER — Other Ambulatory Visit (HOSPITAL_COMMUNITY)
Admission: RE | Admit: 2018-07-16 | Discharge: 2018-07-16 | Disposition: A | Discharge status: Home / Self Care | Payer: Medicare Other | Source: Ambulatory Visit | Attending: Orthopedic Surgery | Admitting: Orthopedic Surgery

## 2018-07-16 ENCOUNTER — Other Ambulatory Visit: Payer: Self-pay | Admitting: Orthopedic Surgery

## 2018-07-16 ENCOUNTER — Encounter (HOSPITAL_COMMUNITY): Payer: Self-pay

## 2018-07-16 ENCOUNTER — Ambulatory Visit (HOSPITAL_COMMUNITY)
Admission: RE | Admit: 2018-07-16 | Discharge: 2018-07-16 | Disposition: A | Discharge status: Home / Self Care | Payer: Medicare Other | Source: Ambulatory Visit | Attending: Orthopedic Surgery | Admitting: Orthopedic Surgery

## 2018-07-16 DIAGNOSIS — Z1159 Encounter for screening for other viral diseases: Secondary | ICD-10-CM | POA: Insufficient documentation

## 2018-07-16 DIAGNOSIS — M1712 Unilateral primary osteoarthritis, left knee: Secondary | ICD-10-CM | POA: Diagnosis not present

## 2018-07-16 DIAGNOSIS — Z01818 Encounter for other preprocedural examination: Secondary | ICD-10-CM

## 2018-07-16 DIAGNOSIS — Z96659 Presence of unspecified artificial knee joint: Secondary | ICD-10-CM | POA: Diagnosis not present

## 2018-07-16 LAB — URINALYSIS, ROUTINE W REFLEX MICROSCOPIC
Bilirubin Urine: NEGATIVE
Glucose, UA: NEGATIVE mg/dL
Ketones, ur: NEGATIVE mg/dL
Leukocytes,Ua: NEGATIVE
Nitrite: NEGATIVE
Protein, ur: NEGATIVE mg/dL
Specific Gravity, Urine: 1.005 (ref 1.005–1.030)
pH: 7 (ref 5.0–8.0)

## 2018-07-16 LAB — CBC WITH DIFFERENTIAL/PLATELET
Abs Immature Granulocytes: 0.04 10*3/uL (ref 0.00–0.07)
Basophils Absolute: 0.1 10*3/uL (ref 0.0–0.1)
Basophils Relative: 1 %
Eosinophils Absolute: 0.1 10*3/uL (ref 0.0–0.5)
Eosinophils Relative: 2 %
HCT: 39.2 % (ref 36.0–46.0)
Hemoglobin: 13.1 g/dL (ref 12.0–15.0)
Immature Granulocytes: 1 %
Lymphocytes Relative: 19 %
Lymphs Abs: 1.2 10*3/uL (ref 0.7–4.0)
MCH: 31.6 pg (ref 26.0–34.0)
MCHC: 33.4 g/dL (ref 30.0–36.0)
MCV: 94.5 fL (ref 80.0–100.0)
Monocytes Absolute: 0.7 10*3/uL (ref 0.1–1.0)
Monocytes Relative: 11 %
Neutro Abs: 4.2 10*3/uL (ref 1.7–7.7)
Neutrophils Relative %: 66 %
Platelets: 286 10*3/uL (ref 150–400)
RBC: 4.15 MIL/uL (ref 3.87–5.11)
RDW: 12.7 % (ref 11.5–15.5)
WBC: 6.2 10*3/uL (ref 4.0–10.5)
nRBC: 0 % (ref 0.0–0.2)

## 2018-07-16 LAB — PROTIME-INR
INR: 1 (ref 0.8–1.2)
Prothrombin Time: 13 seconds (ref 11.4–15.2)

## 2018-07-16 LAB — BASIC METABOLIC PANEL
Anion gap: 11 (ref 5–15)
BUN: 16 mg/dL (ref 8–23)
CO2: 27 mmol/L (ref 22–32)
Calcium: 9.8 mg/dL (ref 8.9–10.3)
Chloride: 99 mmol/L (ref 98–111)
Creatinine, Ser: 0.71 mg/dL (ref 0.44–1.00)
GFR calc Af Amer: >60 mL/min (ref >60)
GFR calc non Af Amer: >60 mL/min (ref >60)
Glucose, Bld: 90 mg/dL (ref 70–99)
Potassium: 4.6 mmol/L (ref 3.5–5.1)
Sodium: 137 mmol/L (ref 135–145)

## 2018-07-16 LAB — APTT: aPTT: 46 seconds — ABNORMAL HIGH (ref 24–36)

## 2018-07-16 LAB — ABO/RH: ABO/RH(D): A POS

## 2018-07-16 LAB — SURGICAL PCR SCREEN
MRSA, PCR: NEGATIVE
Staphylococcus aureus: NEGATIVE

## 2018-07-16 NOTE — Care Plan (Signed)
Spoke with patient prior to surgery. She will discharge to home with family and HHPT. Referral to [facility]. She has requested a  3n1. Ordered to be delivered to room prior to discharge. She will transition to OPPT after follow up with MD. Patient and MD aware of plan and in agreement with it. Choice offered.    Ladell Heads, [place]

## 2018-07-17 DIAGNOSIS — M1712 Unilateral primary osteoarthritis, left knee: Secondary | ICD-10-CM | POA: Diagnosis present

## 2018-07-17 LAB — SARS CORONAVIRUS 2 (TAT 6-24 HRS): SARS Coronavirus 2: NEGATIVE

## 2018-07-17 NOTE — Progress Notes (Signed)
Anesthesia Chart Review   Case: [id number] Date/Time: 07/20/18 1055   Procedure: Left Knee Arthroplasty (Left )   Anesthesia type: Spinal   Pre-op diagnosis: Left Knee Osteoarthritis Valgus   Location: [place] [facility] / [facility]   Surgeon: Frederik Pear, MD      DISCUSSION:68 y.o. 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 Frederik Pear.   Last seen by neurologist, Dr. Ellouise Newer, 09/03/2017.  Stable at this visit with 1 year follow up recommended.    Anticipate pt can proceed with planned procedure barring acute status change.   VS: BP (!) 153/83 (BP Location: Right Arm)   Pulse 72   Temp 36.6 C (Oral)   Resp 18   Ht 5\' 5"  (1.651 m)   Wt 97.8 kg   SpO2 96%   BMI 35.87 kg/m   PROVIDERS: Elby Beck, FNP is PCP  Ellouise Newer, MD is Neurologist  LABS: Labs reviewed: Acceptable for surgery. (all labs ordered are listed, but only abnormal results are displayed)  Labs Reviewed  APTT - Abnormal; Notable for the following components:      Result Value   aPTT 46 (*)    All other components within normal limits  URINALYSIS, ROUTINE W REFLEX MICROSCOPIC - Abnormal; Notable for the following components:   Color, Urine STRAW (*)    Hgb urine dipstick SMALL (*)    Bacteria, UA RARE (*)    All other components within normal limits  SURGICAL PCR SCREEN  BASIC METABOLIC PANEL  CBC WITH DIFFERENTIAL/PLATELET  PROTIME-INR  TYPE AND SCREEN  ABO/RH     IMAGES: Chest Xray 07/16/2018 FINDINGS: Mediastinum and hilar structures normal. Heart size normal. Lungs are clear. No pleural effusion or pneumothorax noted. Degenerative change thoracic spine.  IMPRESSION: No acute cardiopulmonary disease.  EKG: 07/16/2018 Rate 66 bpm Normal sinus rhythm   CV:  Past Medical History:  Diagnosis Date  . Allergy   .  Anxiety   . Depression   . Dysrhythmia    FLB - ? PAC'S COMES AND GOES  . High cholesterol   . Hx of adenomatous polyp of colon    2012 - [place]  . Hyperlipidemia   . Hypertension   . Osteoarthritis   . PLMD (periodic limb movement disorder) 04/24/2012  . Seizures ([place])    08/29/13  . Sleep apnea    wears CPAP    Past Surgical History:  Procedure Laterality Date  . APPENDECTOMY    . CESAREAN SECTION    . CHOLECYSTECTOMY  1978  . COLONOSCOPY    . DILATION AND CURETTAGE OF UTERUS    . JOINT REPLACEMENT  1998   right knee  . OSTEOTOMY PROXIMAL FEMORAL    . 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   Procedure: TOTAL HIP ARTHROPLASTY ANTERIOR APPROACH;  Surgeon: Frederik Pear, MD;  Location: [place];  Service: Orthopedics;  Laterality: Right;    MEDICATIONS: . aspirin 81 MG tablet  . Biotin 1000 MCG tablet  . CALCIUM CARBONATE PO  . cetirizine (ZYRTEC) 10 MG tablet  . citalopram (CELEXA) 40 MG tablet  . hydroxypropyl methylcellulose / hypromellose (ISOPTO TEARS / GONIOVISC) 2.5 % ophthalmic solution  . levETIRAcetam (KEPPRA) 500 MG tablet  . levothyroxine (SYNTHROID, LEVOTHROID) 50 MCG tablet  . losartan (COZAAR) 50 MG tablet  . MELATONIN PO  . Multiple Vitamins-Minerals (MULTIPLE  VITAMINS/WOMENS PO)  . pravastatin (PRAVACHOL) 80 MG tablet   No current facility-administered medications for this encounter.    Maia Plan [facility] Pre-Surgical Testing [phone] 07/17/18  9:47 AM

## 2018-07-17 NOTE — Progress Notes (Addendum)
Called and left message about time change for 07/20/2018 surgery. Patient instructed to arrive at 0630 to admitting to get to short stay by 0640. To complete ensure eras drink by 0615 on 07/20/2018. She is asked to call back that she received the message.  1745  Patient called back that she got the message.

## 2018-07-17 NOTE — Anesthesia Preprocedure Evaluation (Addendum)
Anesthesia Evaluation  Patient identified by MRN, date of birth, ID band Patient awake    Reviewed: Allergy & Precautions, NPO status , Patient's Chart, lab work & pertinent test results  History of Anesthesia Complications Negative for: history of anesthetic complications  Airway Mallampati: II  TM Distance: >3 FB Neck ROM: Full    Dental  (+) Dental Advisory Given   Pulmonary sleep apnea and Continuous Positive Airway Pressure Ventilation ,  07/16/2018 SARS coronavirus NEG   breath sounds clear to auscultation       Cardiovascular hypertension, Pt. on medications (-) angina Rhythm:Regular Rate:Normal     Neuro/Psych Seizures -, Well Controlled,  Anxiety Depression    GI/Hepatic Neg liver ROS, GERD  Controlled,  Endo/Other  Hypothyroidism Morbid obesity  Renal/GU negative Renal ROS     Musculoskeletal  (+) Arthritis ,   Abdominal (+) + obese,   Peds  Hematology negative hematology ROS (+)   Anesthesia Other Findings   Reproductive/Obstetrics                           Anesthesia Physical Anesthesia Plan  ASA: III  Anesthesia Plan: Spinal   Post-op Pain Management:  [facility] for Post-op pain   Induction:   PONV Risk Score and Plan: 2 and Ondansetron and Dexamethasone  Airway Management Planned: Natural Airway and Simple Face Mask  Additional Equipment:   Intra-op Plan:   Post-operative Plan:   Informed Consent: I have reviewed the patients History and Physical, chart, labs and discussed the procedure including the risks, benefits and alternatives for the proposed anesthesia with the patient or authorized representative who has indicated his/her understanding and acceptance.     Dental advisory given  Plan Discussed with: CRNA and Surgeon  Anesthesia Plan Comments: (See PAT note 07/16/2018, Konrad Felix, PA-C Plan routine monitors, SAB with adductor canal block for post op  analgesia)      Anesthesia Quick Evaluation

## 2018-07-17 NOTE — H&P (Signed)
TOTAL KNEE ADMISSION H&P  Patient is being admitted for left total knee arthroplasty.  Subjective:  Chief Complaint:left knee pain.  HPI: Jennifer [name], 68 y.o. female, has a history of pain and functional disability in the left knee due to arthritis and has failed non-surgical conservative treatments for greater than 12 weeks to includeNSAID's and/or analgesics, flexibility and strengthening excercises, use of assistive devices, weight reduction as appropriate and activity modification.  Onset of symptoms was gradual, starting 10 years ago with gradually worsening course since that time. The patient noted no past surgery on the left knee(s).  Patient currently rates pain in the left knee(s) at 10 out of 10 with activity. Patient has night pain, worsening of pain with activity and weight bearing, pain that interferes with activities of daily living, pain with passive range of motion and crepitus.  Patient has evidence of periarticular osteophytes, joint subluxation and joint space narrowing by imaging studies.   There is no active infection.  Patient Active Problem List   Diagnosis Date Noted  . Chronic constipation 12/20/2017  . Class 1 obesity due to excess calories without serious comorbidity with body mass index (BMI) of 34.0 to 34.9 in adult 12/20/2017  . Primary osteoarthritis of right hip 10/20/2015  . Arthritis of right hip 10/20/2015  . Localization-related idiopathic epilepsy and epileptic syndromes with seizures of localized onset, not intractable, without status epilepticus ([place]) 09/09/2014  . Depression 07/19/2014  . Hx of adenomatous polyp of colon 03/29/2014  . Seizure after head injury ([place]) 08/29/2013  . OSA (obstructive sleep apnea) 04/24/2012  . PLMD (periodic limb movement disorder) 04/24/2012  . Fibroids, submucosal 12/18/2010  . HYPERLIPIDEMIA 07/04/2008  . ESSENTIAL HYPERTENSION, BENIGN 07/04/2008  . BAKER'S CYST, LEFT KNEE 07/04/2008  . Shortened PR interval  07/04/2008   Past Medical History:  Diagnosis Date  . Allergy   . Anxiety   . Depression   . Dysrhythmia    FLB - ? PAC'S COMES AND GOES  . High cholesterol   . Hx of adenomatous polyp of colon    2012 - [place]  . Hyperlipidemia   . Hypertension   . Osteoarthritis   . PLMD (periodic limb movement disorder) 04/24/2012  . Seizures ([place])    08/29/13  . Sleep apnea    wears CPAP    Past Surgical History:  Procedure Laterality Date  . APPENDECTOMY    . CESAREAN SECTION    . CHOLECYSTECTOMY  1978  . COLONOSCOPY    . DILATION AND CURETTAGE OF UTERUS    . JOINT REPLACEMENT  1998   right knee  . OSTEOTOMY PROXIMAL FEMORAL    . 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   Procedure: TOTAL HIP ARTHROPLASTY ANTERIOR APPROACH;  Surgeon: Frederik Pear, MD;  Location: [facility];  Service: Orthopedics;  Laterality: Right;    No current facility-administered medications for this encounter.    Current Outpatient Medications  Medication Sig Dispense Refill Last Dose  . aspirin 81 MG tablet Take 81 mg by mouth daily.     . Biotin 1000 MCG tablet Take 1,000 mcg by mouth daily.     Marland Kitchen CALCIUM CARBONATE PO Take 600 mg by mouth daily.      . cetirizine (ZYRTEC) 10 MG tablet Take 10 mg by mouth every evening.      . citalopram (CELEXA) 40 MG tablet TAKE 1 TABLET BY MOUTH EVERY DAY (Patient taking differently: Take 40 mg by mouth daily. ) 90 tablet 1   . hydroxypropyl  methylcellulose / hypromellose (ISOPTO TEARS / GONIOVISC) 2.5 % ophthalmic solution Place 1 drop into both eyes 2 (two) times daily as needed for dry eyes.     Marland Kitchen levETIRAcetam (KEPPRA) 500 MG tablet Take 1 tablet (500 mg total) by mouth 2 (two) times daily. 180 tablet 3   . levothyroxine (SYNTHROID, LEVOTHROID) 50 MCG tablet Take 1 tablet (50 mcg total) by mouth daily. 90 tablet 3   . losartan (COZAAR) 50 MG tablet Take 1 tablet (50 mg total) by mouth daily. 90 tablet 3   . MELATONIN PO Take 10 mg by mouth at bedtime.      . Multiple  Vitamins-Minerals (MULTIPLE VITAMINS/WOMENS PO) Take 1 tablet by mouth daily.      . pravastatin (PRAVACHOL) 80 MG tablet TAKE 1 TABLET BY MOUTH EVERY DAY (Patient taking differently: Take 80 mg by mouth daily. ) 90 tablet 1    Allergies  Allergen Reactions  . Crestor [Rosuvastatin] Other (See Comments)    Muscle aches  . Neomycin-Bacitracin Zn-Polymyx Swelling    OPTHALMIC EXPOSURE ONLY - - SWELLING EYES   . Neomycin-Bacitracin-Polymyxin [Bacitracin-Neomycin-Polymyxin] Swelling    Reaction to eye drops  . Codeine Rash  . Feldene [Piroxicam] Rash  . Penicillins Rash    Has patient had a PCN reaction causing immediate rash, facial/tongue/throat swelling, SOB or lightheadedness with hypotension:Yes Has patient had a PCN reaction causing severe rash involving mucus membranes or skin necrosis: No Has patient had a PCN reaction that required hospitalization:No Has patient had a PCN reaction occurring within the last 10 years:No If all of the above answers are "NO", then may proceed with Cephalosporin use.   . Piroxicam Rash  . Sulfa Antibiotics Rash  . Sulfonamide Derivatives Rash    Social History   Tobacco Use  . Smoking status: Never Smoker  . Smokeless tobacco: Never Used  Substance Use Topics  . Alcohol use: No    Alcohol/week: 0.0 standard drinks    Family History  Problem Relation Age of Onset  . Cancer Father 24  . Esophageal cancer Father   . Heart disease Mother 43       Aortic valve disease  . Atrial fibrillation Brother   . Sleep apnea Brother   . Sleep apnea Sister   . Sleep apnea Sister   . Stomach cancer Maternal Grandmother   . Colon cancer Neg Hx   . Rectal cancer Neg Hx      Review of Systems  Constitutional: Negative.   HENT: Positive for sinus pain.   Eyes: Negative.   Respiratory: Negative.   Cardiovascular:       HTN  Gastrointestinal: Positive for constipation.  Genitourinary: Negative.   Musculoskeletal: Positive for joint pain and  myalgias.  Skin: Negative.   Neurological: Positive for seizures.  Endo/Heme/Allergies: Negative.   Psychiatric/Behavioral: Positive for depression. The patient is nervous/anxious.     Objective:  Physical Exam  Constitutional: She is oriented to person, place, and time. She appears well-developed and well-nourished.  HENT:  Head: Normocephalic and atraumatic.  Eyes: Pupils are equal, round, and reactive to light.  Neck: Normal range of motion. Neck supple.  Respiratory: Effort normal.  Musculoskeletal:        General: Tenderness present.     Comments: The left knee has an obvious valgus deformity of 15-20. She is nontender along the medial joint line tender along the lateral joint line range of motion 0/130 collateral ligaments are stable neurovascular intact distally trace effusion.  Neurological:  She is alert and oriented to person, place, and time.  Skin: Skin is warm and dry.  Psychiatric: She has a normal mood and affect. Her behavior is normal. Judgment and thought content normal.    Vital signs in last 24 hours:    Labs:   Estimated body mass index is 35.87 kg/m as calculated from the following:   Height as of 07/16/18: 5\' 5"  (1.651 m).   Weight as of 07/16/18: 97.8 kg.   Imaging Review Plain radiographs demonstrate AP, Rosen,  lateral and sunrise x-rays show end-stage arthritis of the left knee with valgus deformity lateral subluxation of tibia beneath femur.  There is some early erosion of the left lateral femoral condyle.   Assessment/Plan:  End stage arthritis, left knee   The patient history, physical examination, clinical judgment of the provider and imaging studies are consistent with end stage degenerative joint disease of the left knee(s) and total knee arthroplasty is deemed medically necessary. The treatment options including medical management, injection therapy arthroscopy and arthroplasty were discussed at length. The risks and benefits of total knee  arthroplasty were presented and reviewed. The risks due to aseptic loosening, infection, stiffness, patella tracking problems, thromboembolic complications and other imponderables were discussed. The patient acknowledged the explanation, agreed to proceed with the plan and consent was signed. Patient is being admitted for inpatient treatment for surgery, pain control, PT, OT, prophylactic antibiotics, VTE prophylaxis, progressive ambulation and ADL's and discharge planning. The patient is planning to be discharged home with home health services     Patient's anticipated LOS is less than 2 midnights, meeting these requirements: - Younger than 97 - Lives within 1 hour of care - Has a competent adult at home to recover with post-op recover - NO history of  - Chronic pain requiring opiods  - Diabetes  - Coronary Artery Disease  - Heart failure  - Heart attack  - Stroke  - DVT/VTE  - Cardiac arrhythmia  - Respiratory Failure/COPD  - Renal failure  - Anemia  - Advanced Liver disease

## 2018-07-19 MED ORDER — BUPIVACAINE LIPOSOME 1.3 % IJ SUSP
20.0000 mL | INTRAMUSCULAR | Status: DC
  Filled 2018-07-19: qty 20

## 2018-07-19 MED ORDER — TRANEXAMIC ACID 1000 MG/10ML IV SOLN
2000.0000 mg | INTRAVENOUS | Status: DC
  Filled 2018-07-19: qty 20

## 2018-07-20 ENCOUNTER — Ambulatory Visit (HOSPITAL_COMMUNITY): Payer: Medicare Other | Admitting: Anesthesiology

## 2018-07-20 ENCOUNTER — Ambulatory Visit (HOSPITAL_COMMUNITY): Payer: Medicare Other | Admitting: Physician Assistant

## 2018-07-20 ENCOUNTER — Other Ambulatory Visit: Payer: Self-pay

## 2018-07-20 ENCOUNTER — Encounter (HOSPITAL_COMMUNITY): Payer: Self-pay | Admitting: *Deleted

## 2018-07-20 ENCOUNTER — Encounter (HOSPITAL_COMMUNITY)
Admission: RE | Disposition: A | Discharge status: Home / Self Care | Payer: Self-pay | Source: Home / Self Care | Attending: Orthopedic Surgery

## 2018-07-20 ENCOUNTER — Ambulatory Visit (HOSPITAL_COMMUNITY)
Admission: RE | Admit: 2018-07-20 | Discharge: 2018-07-21 | Disposition: A | Discharge status: Home / Self Care | Payer: Medicare Other | Attending: Orthopedic Surgery | Admitting: Orthopedic Surgery

## 2018-07-20 DIAGNOSIS — F418 Other specified anxiety disorders: Secondary | ICD-10-CM | POA: Diagnosis not present

## 2018-07-20 DIAGNOSIS — M25762 Osteophyte, left knee: Secondary | ICD-10-CM | POA: Diagnosis not present

## 2018-07-20 DIAGNOSIS — Z96651 Presence of right artificial knee joint: Secondary | ICD-10-CM | POA: Insufficient documentation

## 2018-07-20 DIAGNOSIS — Z880 Allergy status to penicillin: Secondary | ICD-10-CM | POA: Diagnosis not present

## 2018-07-20 DIAGNOSIS — Z7982 Long term (current) use of aspirin: Secondary | ICD-10-CM | POA: Insufficient documentation

## 2018-07-20 DIAGNOSIS — I10 Essential (primary) hypertension: Secondary | ICD-10-CM | POA: Diagnosis not present

## 2018-07-20 DIAGNOSIS — R569 Unspecified convulsions: Secondary | ICD-10-CM | POA: Insufficient documentation

## 2018-07-20 DIAGNOSIS — E785 Hyperlipidemia, unspecified: Secondary | ICD-10-CM | POA: Insufficient documentation

## 2018-07-20 DIAGNOSIS — Z6835 Body mass index (BMI) 35.0-35.9, adult: Secondary | ICD-10-CM | POA: Diagnosis not present

## 2018-07-20 DIAGNOSIS — E7800 Pure hypercholesterolemia, unspecified: Secondary | ICD-10-CM | POA: Insufficient documentation

## 2018-07-20 DIAGNOSIS — Z79899 Other long term (current) drug therapy: Secondary | ICD-10-CM | POA: Diagnosis not present

## 2018-07-20 DIAGNOSIS — Z86010 Personal history of colonic polyps: Secondary | ICD-10-CM | POA: Diagnosis not present

## 2018-07-20 DIAGNOSIS — M1712 Unilateral primary osteoarthritis, left knee: Secondary | ICD-10-CM | POA: Diagnosis not present

## 2018-07-20 DIAGNOSIS — Z885 Allergy status to narcotic agent status: Secondary | ICD-10-CM | POA: Diagnosis not present

## 2018-07-20 DIAGNOSIS — G4733 Obstructive sleep apnea (adult) (pediatric): Secondary | ICD-10-CM | POA: Insufficient documentation

## 2018-07-20 DIAGNOSIS — F329 Major depressive disorder, single episode, unspecified: Secondary | ICD-10-CM | POA: Diagnosis not present

## 2018-07-20 DIAGNOSIS — M21062 Valgus deformity, not elsewhere classified, left knee: Secondary | ICD-10-CM | POA: Insufficient documentation

## 2018-07-20 DIAGNOSIS — Z882 Allergy status to sulfonamides status: Secondary | ICD-10-CM | POA: Insufficient documentation

## 2018-07-20 DIAGNOSIS — Z79890 Hormone replacement therapy (postmenopausal): Secondary | ICD-10-CM | POA: Diagnosis not present

## 2018-07-20 DIAGNOSIS — F419 Anxiety disorder, unspecified: Secondary | ICD-10-CM | POA: Insufficient documentation

## 2018-07-20 DIAGNOSIS — D62 Acute posthemorrhagic anemia: Secondary | ICD-10-CM | POA: Diagnosis not present

## 2018-07-20 DIAGNOSIS — Z96652 Presence of left artificial knee joint: Secondary | ICD-10-CM

## 2018-07-20 DIAGNOSIS — G8918 Other acute postprocedural pain: Secondary | ICD-10-CM | POA: Diagnosis not present

## 2018-07-20 HISTORY — PX: TOTAL KNEE ARTHROPLASTY: SHX125

## 2018-07-20 LAB — TYPE AND SCREEN
ABO/RH(D): A POS
Antibody Screen: NEGATIVE

## 2018-07-20 SURGERY — ARTHROPLASTY, KNEE, TOTAL
Anesthesia: Spinal | Site: Knee | Laterality: Left

## 2018-07-20 MED ORDER — PROPOFOL 10 MG/ML IV BOLUS
INTRAVENOUS | Status: AC
  Filled 2018-07-20: qty 20

## 2018-07-20 MED ORDER — TRANEXAMIC ACID-NACL 1000-0.7 MG/100ML-% IV SOLN
1000.0000 mg | Freq: Once | INTRAVENOUS | Status: AC
  Administered 2018-07-20: 1000 mg via INTRAVENOUS
  Filled 2018-07-20: qty 100

## 2018-07-20 MED ORDER — CITALOPRAM HYDROBROMIDE 40 MG PO TABS
40.0000 mg | ORAL_TABLET | Freq: Every day | ORAL | Status: DC
  Administered 2018-07-21: 10:00:00 40 mg via ORAL
  Filled 2018-07-20: qty 2
  Filled 2018-07-20: qty 1

## 2018-07-20 MED ORDER — PHENYLEPHRINE 40 MCG/ML (10ML) SYRINGE FOR IV PUSH (FOR BLOOD PRESSURE SUPPORT)
PREFILLED_SYRINGE | INTRAVENOUS | Status: DC | PRN
  Administered 2018-07-20 (×2): 120 ug via INTRAVENOUS

## 2018-07-20 MED ORDER — FENTANYL CITRATE (PF) 100 MCG/2ML IJ SOLN
50.0000 ug | Freq: Once | INTRAMUSCULAR | Status: AC
  Administered 2018-07-20: 50 ug via INTRAVENOUS
  Filled 2018-07-20: qty 2

## 2018-07-20 MED ORDER — GABAPENTIN 300 MG PO CAPS
300.0000 mg | ORAL_CAPSULE | Freq: Three times a day (TID) | ORAL | Status: DC
  Administered 2018-07-20 – 2018-07-21 (×3): 300 mg via ORAL
  Filled 2018-07-20 (×3): qty 1

## 2018-07-20 MED ORDER — SODIUM CHLORIDE 0.9 % IR SOLN
Status: DC | PRN
  Administered 2018-07-20: 1000 mL

## 2018-07-20 MED ORDER — OXYCODONE-ACETAMINOPHEN 5-325 MG PO TABS: 1.0000 | ORAL_TABLET | ORAL | 0 refills | Status: DC | PRN

## 2018-07-20 MED ORDER — PHENOL 1.4 % MT LIQD: 1.0000 | OROMUCOSAL | Status: DC | PRN

## 2018-07-20 MED ORDER — PROMETHAZINE HCL 25 MG/ML IJ SOLN: 6.2500 mg | INTRAMUSCULAR | Status: DC | PRN

## 2018-07-20 MED ORDER — HYDROMORPHONE HCL 1 MG/ML IJ SOLN: 0.5000 mg | INTRAMUSCULAR | Status: DC | PRN

## 2018-07-20 MED ORDER — DIPHENHYDRAMINE HCL 12.5 MG/5ML PO ELIX: 12.5000 mg | ORAL_SOLUTION | ORAL | Status: DC | PRN

## 2018-07-20 MED ORDER — ROPIVACAINE HCL 7.5 MG/ML IJ SOLN
INTRAMUSCULAR | Status: DC | PRN
  Administered 2018-07-20: 20 mL via PERINEURAL

## 2018-07-20 MED ORDER — LEVETIRACETAM 500 MG PO TABS
500.0000 mg | ORAL_TABLET | Freq: Two times a day (BID) | ORAL | Status: DC
  Administered 2018-07-20 – 2018-07-21 (×2): 500 mg via ORAL
  Filled 2018-07-20 (×2): qty 1

## 2018-07-20 MED ORDER — TIZANIDINE HCL 2 MG PO TABS: 2.0000 mg | ORAL_TABLET | Freq: Four times a day (QID) | ORAL | 0 refills | Status: DC | PRN

## 2018-07-20 MED ORDER — LACTATED RINGERS IV SOLN
INTRAVENOUS | Status: DC
  Administered 2018-07-20: 07:00:00 via INTRAVENOUS

## 2018-07-20 MED ORDER — OXYCODONE HCL 5 MG PO TABS
5.0000 mg | ORAL_TABLET | ORAL | Status: DC | PRN
  Administered 2018-07-20: 22:00:00 5 mg via ORAL
  Administered 2018-07-21 (×2): 10 mg via ORAL
  Filled 2018-07-20: qty 1
  Filled 2018-07-20 (×2): qty 2

## 2018-07-20 MED ORDER — MEPERIDINE HCL 50 MG/ML IJ SOLN: 6.2500 mg | INTRAMUSCULAR | Status: DC | PRN

## 2018-07-20 MED ORDER — PRAVASTATIN SODIUM 40 MG PO TABS
80.0000 mg | ORAL_TABLET | Freq: Every evening | ORAL | Status: DC
  Administered 2018-07-20: 18:00:00 80 mg via ORAL
  Filled 2018-07-20: qty 4
  Filled 2018-07-20 (×2): qty 2

## 2018-07-20 MED ORDER — DEXAMETHASONE SODIUM PHOSPHATE 10 MG/ML IJ SOLN
INTRAMUSCULAR | Status: DC | PRN
  Administered 2018-07-20: 8 mg via INTRAVENOUS

## 2018-07-20 MED ORDER — SODIUM CHLORIDE (PF) 0.9 % IJ SOLN
INTRAMUSCULAR | Status: DC | PRN
  Administered 2018-07-20: 70 mL via INTRAVENOUS

## 2018-07-20 MED ORDER — VANCOMYCIN HCL IN DEXTROSE 1-5 GM/200ML-% IV SOLN
1000.0000 mg | INTRAVENOUS | Status: AC
  Administered 2018-07-20: 1000 mg via INTRAVENOUS
  Filled 2018-07-20: qty 200

## 2018-07-20 MED ORDER — BISACODYL 5 MG PO TBEC: 5.0000 mg | DELAYED_RELEASE_TABLET | Freq: Every day | ORAL | Status: DC | PRN

## 2018-07-20 MED ORDER — PROPOFOL 500 MG/50ML IV EMUL
INTRAVENOUS | Status: DC | PRN
  Administered 2018-07-20: 135 ug/kg/min via INTRAVENOUS

## 2018-07-20 MED ORDER — DEXAMETHASONE SODIUM PHOSPHATE 10 MG/ML IJ SOLN
INTRAMUSCULAR | Status: AC
  Filled 2018-07-20: qty 1

## 2018-07-20 MED ORDER — ONDANSETRON HCL 4 MG/2ML IJ SOLN
INTRAMUSCULAR | Status: AC
  Filled 2018-07-20: qty 2

## 2018-07-20 MED ORDER — METHOCARBAMOL 500 MG PO TABS
500.0000 mg | ORAL_TABLET | Freq: Four times a day (QID) | ORAL | Status: DC | PRN
  Administered 2018-07-21: 500 mg via ORAL
  Filled 2018-07-20: qty 1

## 2018-07-20 MED ORDER — ASPIRIN EC 81 MG PO TBEC: 81.0000 mg | DELAYED_RELEASE_TABLET | Freq: Two times a day (BID) | ORAL | 0 refills | Status: DC

## 2018-07-20 MED ORDER — MELATONIN 5 MG PO TABS
10.0000 mg | ORAL_TABLET | Freq: Every day | ORAL | Status: DC
  Administered 2018-07-20: 10 mg via ORAL
  Filled 2018-07-20: qty 2

## 2018-07-20 MED ORDER — KCL IN DEXTROSE-NACL 20-5-0.45 MEQ/L-%-% IV SOLN
INTRAVENOUS | Status: DC
  Administered 2018-07-20 – 2018-07-21 (×3): via INTRAVENOUS
  Filled 2018-07-20 (×4): qty 1000

## 2018-07-20 MED ORDER — LOSARTAN POTASSIUM 50 MG PO TABS
50.0000 mg | ORAL_TABLET | Freq: Every day | ORAL | Status: DC
  Administered 2018-07-21: 10:00:00 50 mg via ORAL
  Filled 2018-07-20: qty 1

## 2018-07-20 MED ORDER — PHENYLEPHRINE HCL (PRESSORS) 10 MG/ML IV SOLN
INTRAVENOUS | Status: AC
  Filled 2018-07-20: qty 1

## 2018-07-20 MED ORDER — ALUM & MAG HYDROXIDE-SIMETH 200-200-20 MG/5ML PO SUSP: 30.0000 mL | ORAL | Status: DC | PRN

## 2018-07-20 MED ORDER — BIOTIN 1000 MCG PO TABS: 1000.0000 ug | ORAL_TABLET | Freq: Every day | ORAL | Status: DC

## 2018-07-20 MED ORDER — LORATADINE 10 MG PO TABS
10.0000 mg | ORAL_TABLET | Freq: Every day | ORAL | Status: DC
  Administered 2018-07-20 – 2018-07-21 (×2): 10 mg via ORAL
  Filled 2018-07-20 (×2): qty 1

## 2018-07-20 MED ORDER — HYPROMELLOSE (GONIOSCOPIC) 2.5 % OP SOLN: 1.0000 [drp] | Freq: Two times a day (BID) | OPHTHALMIC | Status: DC | PRN

## 2018-07-20 MED ORDER — PROPOFOL 10 MG/ML IV BOLUS
INTRAVENOUS | Status: DC | PRN
  Administered 2018-07-20: 10 mg via INTRAVENOUS

## 2018-07-20 MED ORDER — LEVOTHYROXINE SODIUM 50 MCG PO TABS
50.0000 ug | ORAL_TABLET | Freq: Every day | ORAL | Status: DC
  Administered 2018-07-21: 50 ug via ORAL
  Filled 2018-07-20: qty 1

## 2018-07-20 MED ORDER — MIDAZOLAM HCL 2 MG/2ML IJ SOLN
1.0000 mg | Freq: Once | INTRAMUSCULAR | Status: AC
  Administered 2018-07-20: 1 mg via INTRAVENOUS
  Filled 2018-07-20: qty 2

## 2018-07-20 MED ORDER — ONDANSETRON HCL 4 MG PO TABS: 4.0000 mg | ORAL_TABLET | Freq: Four times a day (QID) | ORAL | Status: DC | PRN

## 2018-07-20 MED ORDER — BUPIVACAINE-EPINEPHRINE (PF) 0.25% -1:200000 IJ SOLN
INTRAMUSCULAR | Status: AC
  Filled 2018-07-20: qty 30

## 2018-07-20 MED ORDER — FLEET ENEMA 7-19 GM/118ML RE ENEM: 1.0000 | ENEMA | Freq: Once | RECTAL | Status: DC | PRN

## 2018-07-20 MED ORDER — PANTOPRAZOLE SODIUM 40 MG PO TBEC
40.0000 mg | DELAYED_RELEASE_TABLET | Freq: Every day | ORAL | Status: DC
  Administered 2018-07-20 – 2018-07-21 (×2): 40 mg via ORAL
  Filled 2018-07-20 (×2): qty 1

## 2018-07-20 MED ORDER — BUPIVACAINE-EPINEPHRINE (PF) 0.25% -1:200000 IJ SOLN
INTRAMUSCULAR | Status: DC | PRN
  Administered 2018-07-20: 30 mL via PERINEURAL

## 2018-07-20 MED ORDER — WATER FOR IRRIGATION, STERILE IR SOLN
Status: DC | PRN
  Administered 2018-07-20 (×2): 1000 mL

## 2018-07-20 MED ORDER — METOCLOPRAMIDE HCL 5 MG PO TABS: 5.0000 mg | ORAL_TABLET | Freq: Three times a day (TID) | ORAL | Status: DC | PRN

## 2018-07-20 MED ORDER — DOCUSATE SODIUM 100 MG PO CAPS
100.0000 mg | ORAL_CAPSULE | Freq: Two times a day (BID) | ORAL | Status: DC
  Administered 2018-07-20 – 2018-07-21 (×2): 100 mg via ORAL
  Filled 2018-07-20 (×2): qty 1

## 2018-07-20 MED ORDER — TRANEXAMIC ACID 1000 MG/10ML IV SOLN
INTRAVENOUS | Status: DC | PRN
  Administered 2018-07-20: 2000 mg via TOPICAL

## 2018-07-20 MED ORDER — HYDROMORPHONE HCL 1 MG/ML IJ SOLN: 0.2500 mg | INTRAMUSCULAR | Status: DC | PRN

## 2018-07-20 MED ORDER — METOCLOPRAMIDE HCL 5 MG/ML IJ SOLN: 5.0000 mg | Freq: Three times a day (TID) | INTRAMUSCULAR | Status: DC | PRN

## 2018-07-20 MED ORDER — MIDAZOLAM HCL 2 MG/2ML IJ SOLN: 0.5000 mg | Freq: Once | INTRAMUSCULAR | Status: DC | PRN

## 2018-07-20 MED ORDER — BUPIVACAINE LIPOSOME 1.3 % IJ SUSP
INTRAMUSCULAR | Status: DC | PRN
  Administered 2018-07-20: 20 mL

## 2018-07-20 MED ORDER — DEXAMETHASONE SODIUM PHOSPHATE 10 MG/ML IJ SOLN
10.0000 mg | Freq: Once | INTRAMUSCULAR | Status: AC
  Administered 2018-07-21: 10 mg via INTRAVENOUS
  Filled 2018-07-20: qty 1

## 2018-07-20 MED ORDER — POVIDONE-IODINE 10 % EX SWAB
2.0000 "application " | Freq: Once | CUTANEOUS | Status: AC
  Administered 2018-07-20: 2 via TOPICAL

## 2018-07-20 MED ORDER — PHENYLEPHRINE 40 MCG/ML (10ML) SYRINGE FOR IV PUSH (FOR BLOOD PRESSURE SUPPORT)
PREFILLED_SYRINGE | INTRAVENOUS | Status: AC
  Filled 2018-07-20: qty 10

## 2018-07-20 MED ORDER — SODIUM CHLORIDE (PF) 0.9 % IJ SOLN
INTRAMUSCULAR | Status: AC
  Filled 2018-07-20: qty 50

## 2018-07-20 MED ORDER — SODIUM CHLORIDE (PF) 0.9 % IJ SOLN
INTRAMUSCULAR | Status: AC
  Filled 2018-07-20: qty 20

## 2018-07-20 MED ORDER — ONDANSETRON HCL 4 MG/2ML IJ SOLN: 4.0000 mg | Freq: Four times a day (QID) | INTRAMUSCULAR | Status: DC | PRN

## 2018-07-20 MED ORDER — CHLORHEXIDINE GLUCONATE 4 % EX LIQD: 60.0000 mL | Freq: Once | CUTANEOUS | Status: DC

## 2018-07-20 MED ORDER — SODIUM CHLORIDE 0.9 % IV SOLN
INTRAVENOUS | Status: DC | PRN
  Administered 2018-07-20: 10:00:00 25 ug/min via INTRAVENOUS

## 2018-07-20 MED ORDER — TRANEXAMIC ACID-NACL 1000-0.7 MG/100ML-% IV SOLN
1000.0000 mg | INTRAVENOUS | Status: AC
  Administered 2018-07-20: 1000 mg via INTRAVENOUS
  Filled 2018-07-20: qty 100

## 2018-07-20 MED ORDER — MENTHOL 3 MG MT LOZG: 1.0000 | LOZENGE | OROMUCOSAL | Status: DC | PRN

## 2018-07-20 MED ORDER — ASPIRIN 81 MG PO CHEW
81.0000 mg | CHEWABLE_TABLET | Freq: Two times a day (BID) | ORAL | Status: DC
  Administered 2018-07-20 – 2018-07-21 (×2): 81 mg via ORAL
  Filled 2018-07-20 (×2): qty 1

## 2018-07-20 MED ORDER — METHOCARBAMOL 500 MG IVPB - SIMPLE MED
500.0000 mg | Freq: Four times a day (QID) | INTRAVENOUS | Status: DC | PRN
  Filled 2018-07-20: qty 50

## 2018-07-20 MED ORDER — ONDANSETRON HCL 4 MG/2ML IJ SOLN
INTRAMUSCULAR | Status: DC | PRN
  Administered 2018-07-20: 4 mg via INTRAVENOUS

## 2018-07-20 MED ORDER — POLYVINYL ALCOHOL 1.4 % OP SOLN: 1.0000 [drp] | Freq: Two times a day (BID) | OPHTHALMIC | Status: DC | PRN

## 2018-07-20 MED ORDER — PROPOFOL 10 MG/ML IV BOLUS
INTRAVENOUS | Status: AC
  Filled 2018-07-20: qty 80

## 2018-07-20 MED ORDER — POLYETHYLENE GLYCOL 3350 17 G PO PACK: 17.0000 g | PACK | Freq: Every day | ORAL | Status: DC | PRN

## 2018-07-20 MED ORDER — ACETAMINOPHEN 325 MG PO TABS: 325.0000 mg | ORAL_TABLET | Freq: Four times a day (QID) | ORAL | Status: DC | PRN

## 2018-07-20 SURGICAL SUPPLY — 58 items
ATTUNE PS FEM LT SZ 5 CEM KNEE (Femur) ×1 IMPLANT
ATTUNE PSRP INSR SZ5 6 KNEE (Insert) ×1 IMPLANT
BAG DECANTER FOR FLEXI CONT (MISCELLANEOUS) ×2 IMPLANT
BAG SPEC THK2 15X12 ZIP CLS (MISCELLANEOUS) ×1
BAG ZIPLOCK 12X15 (MISCELLANEOUS) ×2 IMPLANT
BASE TIBIAL ROT PLAT SZ 5 KNEE (Knees) IMPLANT
BLADE SAG 18X100X1.27 (BLADE) ×2 IMPLANT
BLADE SAW SGTL 11.0X1.19X90.0M (BLADE) ×2 IMPLANT
BLADE SURG SZ10 CARB STEEL (BLADE) ×2 IMPLANT
BNDG CMPR MED 10X6 ELC LF (GAUZE/BANDAGES/DRESSINGS) ×1
BNDG ELASTIC 6X10 VLCR STRL LF (GAUZE/BANDAGES/DRESSINGS) ×2 IMPLANT
BOWL SMART MIX CTS (DISPOSABLE) ×2 IMPLANT
BSPLAT TIB 5 CMNT ROT PLAT STR (Knees) ×1 IMPLANT
CEMENT HV SMART SET (Cement) ×4 IMPLANT
COVER SURGICAL LIGHT HANDLE (MISCELLANEOUS) ×2 IMPLANT
COVER WAND RF STERILE (DRAPES) IMPLANT
CUFF TOURN SGL QUICK 34 (TOURNIQUET CUFF) ×2
CUFF TRNQT CYL 34X4.125X (TOURNIQUET CUFF) ×1 IMPLANT
DECANTER SPIKE VIAL GLASS SM (MISCELLANEOUS) ×5 IMPLANT
DRAPE U-SHAPE 47X51 STRL (DRAPES) ×2 IMPLANT
DRESSING AQUACEL AG SP 3.5X10 (GAUZE/BANDAGES/DRESSINGS) IMPLANT
DRSG AQUACEL AG ADV 3.5X10 (GAUZE/BANDAGES/DRESSINGS) ×2 IMPLANT
DRSG AQUACEL AG SP 3.5X10 (GAUZE/BANDAGES/DRESSINGS) ×2
DURAPREP 26ML APPLICATOR (WOUND CARE) ×2 IMPLANT
ELECT REM PT RETURN 15FT ADLT (MISCELLANEOUS) ×2 IMPLANT
GLOVE BIO SURGEON STRL SZ7.5 (GLOVE) ×2 IMPLANT
GLOVE BIO SURGEON STRL SZ8.5 (GLOVE) ×2 IMPLANT
GLOVE BIOGEL PI IND STRL 8 (GLOVE) ×1 IMPLANT
GLOVE BIOGEL PI IND STRL 9 (GLOVE) ×1 IMPLANT
GLOVE BIOGEL PI INDICATOR 8 (GLOVE) ×1
GLOVE BIOGEL PI INDICATOR 9 (GLOVE) ×1
GOWN STRL REUS W/TWL XL LVL3 (GOWN DISPOSABLE) ×4 IMPLANT
HANDPIECE INTERPULSE COAX TIP (DISPOSABLE) ×2
HOOD PEEL AWAY FLYTE STAYCOOL (MISCELLANEOUS) ×6 IMPLANT
KIT TURNOVER KIT A (KITS) IMPLANT
NDL HYPO 21X1.5 SAFETY (NEEDLE) ×2 IMPLANT
NEEDLE HYPO 21X1.5 SAFETY (NEEDLE) ×4 IMPLANT
NS IRRIG 1000ML POUR BTL (IV SOLUTION) ×2 IMPLANT
PACK ICE MAXI GEL EZY WRAP (MISCELLANEOUS) ×2 IMPLANT
PACK TOTAL KNEE CUSTOM (KITS) ×2 IMPLANT
PATELLA MEDIAL ATTUN 35MM KNEE (Knees) ×1 IMPLANT
PIN STEINMAN FIXATION KNEE (PIN) ×1 IMPLANT
PIN THREADED HEADED SIGMA (PIN) ×1 IMPLANT
PROTECTOR NERVE ULNAR (MISCELLANEOUS) ×2 IMPLANT
SET HNDPC FAN SPRY TIP SCT (DISPOSABLE) ×1 IMPLANT
STAPLER VISISTAT 35W (STAPLE) IMPLANT
SUT VIC AB 1 CTX 36 (SUTURE) ×2
SUT VIC AB 1 CTX36XBRD ANBCTR (SUTURE) ×1 IMPLANT
SUT VIC AB 2-0 CT1 27 (SUTURE) ×2
SUT VIC AB 2-0 CT1 TAPERPNT 27 (SUTURE) ×1 IMPLANT
SUT VIC AB 3-0 CT1 27 (SUTURE) ×2
SUT VIC AB 3-0 CT1 TAPERPNT 27 (SUTURE) ×1 IMPLANT
SYR CONTROL 10ML LL (SYRINGE) ×4 IMPLANT
TIBIAL BASE ROT PLAT SZ 5 KNEE (Knees) ×2 IMPLANT
TRAY FOLEY MTR SLVR 16FR STAT (SET/KITS/TRAYS/PACK) ×2 IMPLANT
WATER STERILE IRR 1000ML POUR (IV SOLUTION) ×4 IMPLANT
WRAP KNEE MAXI GEL POST OP (GAUZE/BANDAGES/DRESSINGS) ×1 IMPLANT
YANKAUER SUCT BULB TIP 10FT TU (MISCELLANEOUS) ×2 IMPLANT

## 2018-07-20 NOTE — Transfer of Care (Signed)
Immediate Anesthesia Transfer of Care Note  Patient: Jennifer [name]  Procedure(s) Performed: Left Knee Arthroplasty (Left Knee)  Patient Location: PACU  Anesthesia Type:Spinal  Level of Consciousness: awake  Airway & Oxygen Therapy: Patient Spontanous Breathing and Patient connected to face mask oxygen  Post-op Assessment: Report given to RN and Post -op Vital signs reviewed and stable  Post vital signs: Reviewed and stable  Last Vitals:  Vitals Value Taken Time  BP 113/58 07/20/18 1105  Temp    Pulse 61 07/20/18 1106  Resp 11 07/20/18 1106  SpO2 99 % 07/20/18 1106  Vitals shown include unvalidated device data.  Last Pain:  Vitals:   07/20/18 0827  TempSrc:   PainSc: 0-No pain         Complications: No apparent anesthesia complications

## 2018-07-20 NOTE — Anesthesia Postprocedure Evaluation (Signed)
Anesthesia Post Note  Patient: ALAYNAH SCHUTTER  Procedure(s) Performed: Left Knee Arthroplasty (Left Knee)     Patient location during evaluation: PACU Anesthesia Type: Spinal Level of consciousness: awake and alert, oriented and patient cooperative Pain management: pain level controlled Vital Signs Assessment: post-procedure vital signs reviewed and stable Respiratory status: spontaneous breathing, nonlabored ventilation and respiratory function stable Cardiovascular status: blood pressure returned to baseline and stable Postop Assessment: no apparent nausea or vomiting, spinal receding and patient able to bend at knees Anesthetic complications: no    Last Vitals:  Vitals:   07/20/18 0842 07/20/18 1105  BP: 131/71 (!) 113/58  Pulse: 65 (!) 59  Resp: 14 12  Temp:  36.9 C  SpO2: 99% 97%    Last Pain:  Vitals:   07/20/18 1130  TempSrc:   PainSc: 0-No pain                 Traeh Milroy,E. Codylee Patil

## 2018-07-20 NOTE — Interval H&P Note (Signed)
History and Physical Interval Note:  07/20/2018 7:05 AM  Jennifer [name]  has presented today for surgery, with the diagnosis of Left Knee Osteoarthritis Valgus.  The various methods of treatment have been discussed with the patient and family. After consideration of risks, benefits and other options for treatment, the patient has consented to  Procedure(s): Left Knee Arthroplasty (Left) as a surgical intervention.  The patient's history has been reviewed, patient examined, no change in status, stable for surgery.  I have reviewed the patient's chart and labs.  Questions were answered to the patient's satisfaction.     Kerin Salen

## 2018-07-20 NOTE — Op Note (Signed)
PATIENT ID:      Jennifer [name]  MRN:     [id number] DOB/AGE:    1950/11/17 / 68 y.o.       OPERATIVE REPORT    DATE OF PROCEDURE:  07/20/2018       PREOPERATIVE DIAGNOSIS:   Left Knee Osteoarthritis Valgus      Estimated body mass index is 35.87 kg/m as calculated from the following:   Height as of 07/16/18: 5\' 5"  (1.651 m).   Weight as of 07/16/18: 97.8 kg.                                                        POSTOPERATIVE DIAGNOSIS:   Left Knee Osteoarthritis Valgus                                                                      PROCEDURE:  Procedure(s): Left Knee Arthroplasty Using DepuyAttune RP implants #5R Femur, #5Tibia, 6 mm Attune RP bearing, 35 Patella     SURGEON: Kerin Salen    ASSISTANT:   Kerry Hough. [employer]   (Present and scrubbed throughout the case, critical for assistance with exposure, retraction, instrumentation, and closure.)         ANESTHESIA: Spinal, 20cc Exparel, 50cc 0.25% Marcaine  EBL: 300c cc  FLUID REPLACEMENT: 1500 cc crystaloid  Tourniquet Time: None  Drains: None  Tranexamic Acid: 1gm IV, 2gm topical  Exparel: 266mg    COMPLICATIONS:  None         INDICATIONS FOR PROCEDURE: The patient has  Left Knee Osteoarthritis Valgus, Val deformities, XR shows bone on bone arthritis, lateral subluxation of tibia. Patient has failed all conservative measures including anti-inflammatory medicines, narcotics, attempts at exercise and weight loss, cortisone injections and viscosupplementation.  Risks and benefits of surgery have been discussed, questions answered.   DESCRIPTION OF PROCEDURE: The patient identified by armband, received  IV antibiotics, in the holding area at [facility]. Patient taken to the operating room, appropriate anesthetic monitors were attached, and Spinal anesthesia was  induced. IV Tranexamic acid was given.Tourniquet applied high to the operative thigh. Lateral post and foot positioner applied to the table, the lower  extremity was then prepped and draped in usual sterile fashion from the toes to the tourniquet. Time-out procedure was performed. The skin and subcutaneous tissue along the incision was injected with 20 cc of a mixture of Exparel and Marcaine solution, using a 20-gauge by 1-1/2 inch needle. We began the operation, with the knee flexed 130 degrees, by making the anterior midline incision starting at handbreadth above the patella going over the patella 1 cm medial to and 4 cm distal to the tibial tubercle. Small bleeders in the skin and the subcutaneous tissue identified and cauterized. Transverse retinaculum was incised and reflected medially and a medial parapatellar arthrotomy was accomplished. the patella was everted and theprepatellar fat pad resected. The superficial medial collateral ligament was then elevated from anterior to posterior along the proximal flare of the tibia and anterior half of the menisci resected. The knee was hyperflexed exposing  bone on bone arthritis. Peripheral and notch osteophytes as well as the cruciate ligaments were then resected. We continued to work our way around posteriorly along the proximal tibia, and externally rotated the tibia subluxing it out from underneath the femur. A McHale retractor was placed through the notch and a lateral Hohmann retractor placed, and we then drilled through the proximal tibia in line with the axis of the tibia followed by an intramedullary guide rod and 2-degree posterior slope cutting guide. The tibial cutting guide, 4 degree posterior sloped, was pinned into place allowing resection of 7 mm of bone medially and 2 mm of bone laterally. Satisfied with the tibial resection, we then entered the distal femur 2 mm anterior to the PCL origin with the intramedullary guide rod and applied the distal femoral cutting guide set at 9 mm, with 5 degrees of valgus. This was pinned along the epicondylar axis. At this point, the distal femoral cut was  accomplished without difficulty. We then sized for a #5R femoral component and pinned the guide in 0 degrees of external rotation. The chamfer cutting guide was pinned into place. The anterior, posterior, and chamfer cuts were accomplished without difficulty followed by the Attune RP box cutting guide and the box cut. We also removed posterior osteophytes from the posterior femoral condyles. The posterior capsule was injected with Exparel solution. The knee was brought into full extension. We checked our extension gap and fit a 6 mm bearing. Distracting in extension with a lamina spreader,  bleeders in the posterior capsule, Posterior medial and posterior lateral right down and cauterized.  The transexamic acid-soaked sponge was then placed in the gap of the knee and extension. The knee was flexed 30. The posterior patella cut was accomplished with the 9.5 mm Attune cutting guide, sized for a 1mm dome, and the fixation pegs drilled.The knee was then once again hyperflexed exposing the proximal tibia. We sized for a # 5 tibial base plate, applied the smokestack and the conical reamer followed by the the Delta fin keel punch. We then hammered into place the Attune RP trial femoral component, drilled the lugs, inserted a  6 mm trial bearing, trial patellar button, and took the knee through range of motion from 0-130 degrees. Medial and lateral ligamentous stability was checked. No thumb pressure was required for patellar Tracking. The tourniquet was inflated to 357mm after using an esmark wrap. All trial components were removed, mating surfaces irrigated with pulse lavage, and dried with suction and sponges. 10 cc of the Exparel solution was applied to the cancellus bone of the patella distal femur and proximal tibia.  After waiting 30 seconds, the bony surfaces were again, dried with sponges. A double batch of DePuy HV cement was mixed and applied to all bony metallic mating surfaces except for the posterior  condyles of the femur itself. In order, we hammered into place the tibial tray and removed excess cement, the femoral component and removed excess cement. The final Attune RP bearing was inserted, and the knee brought to full extension with compression. The patellar button was clamped into place, and excess cement removed. The knee was held at 30 flexion with compression, while the cement cured. The Tourniquet was delated. The wound was irrigated out with normal saline solution pulse lavage. The rest of the Exparel was injected into the parapatellar arthrotomy, subcutaneous tissues, and periosteal tissues. The parapatellar arthrotomy was closed with running #1 Vicryl suture. The subcutaneous tissue with 0 and 2-0 undyed Vicryl suture,  and the skin with running 3-0 SQ vicryl. An Aquacil and Ace wrap were applied. The patient was taken to recovery room without difficulty.   Kerin Salen 07/20/2018, 10:30 AM

## 2018-07-20 NOTE — Evaluation (Signed)
Physical Therapy Evaluation Patient Details Name: Jennifer [name] MRN: [id number] DOB: 03/14/1950 Today's Date: 07/20/2018   History of Present Illness  68 yo female s/p L TKR on 07/20/18. PMH includes obesity, anxiety/depression, OSA, HLD, seizures, R TKR, R THA.  Clinical Impression  Pt presents with L knee pain, decreased L knee ROM, increased time and effort to perform mobility tasks, and decreased activity tolerance due to pain. Pt to benefit from acute PT to address deficits. Pt ambulated 100 ft with RW with min guard assist, verbal cuing for form and safety provided throughout. Pt educated on ankle pumps (20/hour) to perform this afternoon/evening to increase circulation, to pt's tolerance and limited by pain. PT to progress mobility as tolerated, and will continue to follow acutely.        Follow Up Recommendations Follow surgeon's recommendation for DC plan and follow-up therapies;Supervision for mobility/OOB(HHPT-->OPPT)    Equipment Recommendations  3in1 (PT)    Recommendations for Other Services       Precautions / Restrictions Precautions Precautions: Fall Restrictions Weight Bearing Restrictions: No Other Position/Activity Restrictions: WBAT      Mobility  Bed Mobility Overal bed mobility: Needs Assistance Bed Mobility: Supine to Sit     Supine to sit: Min guard;HOB elevated     General bed mobility comments: Min guard for safety. Verbal cuing for sequencing.  Transfers Overall transfer level: Needs assistance Equipment used: Rolling walker (2 wheeled) Transfers: Sit to/from Stand Sit to Stand: Min guard;From elevated surface         General transfer comment: Min guard for safety, verbal cuing for hand placement when rising.  Ambulation/Gait Ambulation/Gait assistance: Min guard Gait Distance (Feet): 100 Feet Assistive device: Rolling walker (2 wheeled) Gait Pattern/deviations: Step-to pattern;Decreased step length - right;Decreased step length  - left;Decreased stance time - left Gait velocity: decr   General Gait Details: Min guard for safety. Verbal cuing for sequencing with step-to gait, placement in RW with ambulating and turning.  Stairs            Wheelchair Mobility    Modified Rankin (Stroke Patients Only)       Balance Overall balance assessment: Mild deficits observed, not formally tested                                           Pertinent Vitals/Pain Pain Assessment: 0-10 Pain Score: 3  Pain Location: L knee Pain Descriptors / Indicators: Sore Pain Intervention(s): Limited activity within patient's tolerance;Monitored during session;Premedicated before session;Repositioned;Ice applied    Home Living Family/patient expects to be discharged to:: Private residence Living Arrangements: Alone Available Help at Discharge: Family;Available 24 hours/day(sister from [place] to stay with pt) Type of Home: House Home Access: Stairs to enter   [employer] of Steps: 2 Home Layout: One level Home Equipment: Walker - 2 wheels;Cane - single point      Prior Function Level of Independence: Independent               Hand Dominance   Dominant Hand: Right    Extremity/Trunk Assessment   Upper Extremity Assessment Upper Extremity Assessment: Overall WFL for tasks assessed    Lower Extremity Assessment Lower Extremity Assessment: Overall WFL for tasks assessed;LLE deficits/detail LLE Deficits / Details: suspected post-surgical weakness; able to perform ankle pumps, quad set, knee flexion to 90* sitting EOB LLE Sensation: WNL    Cervical /  Trunk Assessment Cervical / Trunk Assessment: Normal  Communication   Communication: No difficulties  Cognition Arousal/Alertness: Awake/alert Behavior During Therapy: WFL for tasks assessed/performed Overall Cognitive Status: Within Functional Limits for tasks assessed                                        General  Comments      Exercises     Assessment/Plan    PT Assessment Patient needs continued PT services  PT Problem List Decreased strength;Decreased mobility;Decreased range of motion;Decreased activity tolerance;Decreased balance;Decreased knowledge of use of DME;Pain       PT Treatment Interventions DME instruction;Therapeutic activities;Gait training;Therapeutic exercise;Patient/family education;Balance training;Stair training;Functional mobility training    PT Goals (Current goals can be found in the Care Plan section)  Acute Rehab PT Goals PT Goal Formulation: With patient Time For Goal Achievement: 07/20/18 Potential to Achieve Goals: Good    Frequency 7X/week   Barriers to discharge        Co-evaluation               AM-PAC PT "6 Clicks" Mobility  Outcome Measure Help needed turning from your back to your side while in a flat bed without using bedrails?: A Little Help needed moving from lying on your back to sitting on the side of a flat bed without using bedrails?: A Little Help needed moving to and from a bed to a chair (including a wheelchair)?: A Little Help needed standing up from a chair using your arms (e.g., wheelchair or bedside chair)?: A Little Help needed to walk in hospital room?: A Little Help needed climbing 3-5 steps with a railing? : A Lot 6 Click Score: 17    End of Session Equipment Utilized During Treatment: Gait belt Activity Tolerance: Patient tolerated treatment well Patient left: in chair;with call bell/phone within reach;with chair alarm set;with SCD's reapplied Nurse Communication: Mobility status PT Visit Diagnosis: Other abnormalities of gait and mobility (R26.89);Difficulty in walking, not elsewhere classified (R26.2)    Time: 8638-1771 PT Time Calculation (min) (ACUTE ONLY): 19 min   Charges:   PT Evaluation $PT Eval Low Complexity: 1 Low         Miria Cappelli Conception Chancy, PT Acute Rehabilitation Services Pager [phone]  Office  [phone]   Sharday Michl D Jemiah [place] 07/20/2018, 6:01 PM

## 2018-07-20 NOTE — Progress Notes (Signed)
Assisted Dr. Carswell Jackson with left, ultrasound guided, adductor canal block. Side rails up, monitors on throughout procedure. See vital signs in flow sheet. Tolerated Procedure well.  

## 2018-07-20 NOTE — Anesthesia Procedure Notes (Signed)
Procedure Name: MAC Date/Time: 07/20/2018 8:57 AM Performed by: Niel Hummer, CRNA Pre-anesthesia Checklist: Patient identified, Emergency Drugs available, Suction available and Patient being monitored Patient Re-evaluated:Patient Re-evaluated prior to induction Oxygen Delivery Method: Simple face mask

## 2018-07-20 NOTE — Discharge Instructions (Signed)

## 2018-07-20 NOTE — Care Plan (Signed)
Ortho Bundle Case Management Note  Patient Details  Name: Jennifer [name] MRN: [id number] Date of Birth: 1950-05-08  Spoke with patient prior to surgery. She will discharge to home with family and HHPT. Referral to [facility]. She has requested a  3n1. Ordered to be delivered to room prior to discharge. She will transition to OPPT after follow up with MD. Patient and MD aware of plan and in agreement with it. Choice offered.                    DME Arranged:  Bedside commode DME Agency:  [employer]  HH Arranged:  PT [place] Agency:  [facility] (formerly [facility])  Additional Comments: Please contact me with any questions of if this plan should need to change.  Ladell Heads,  [place] Specialist  [phone] 07/20/2018, 8:57 AM

## 2018-07-20 NOTE — Anesthesia Procedure Notes (Signed)
Spinal  Patient location during procedure: OR End time: 07/20/2018 9:04 AM Staffing Anesthesiologist: Annye Asa, MD Performed: anesthesiologist  Preanesthetic Checklist Completed: patient identified, site marked, surgical consent, pre-op evaluation, timeout performed, IV checked, risks and benefits discussed and monitors and equipment checked Spinal Block Patient position: sitting Prep: site prepped and draped and DuraPrep Patient monitoring: blood pressure, continuous pulse ox, cardiac monitor and heart rate Approach: midline Location: L3-4 Injection technique: single-shot Needle Needle type: Pencan  Needle gauge: 24 G Needle length: 9 cm Additional Notes Pt identified in Operating room.  Monitors applied. Working IV access confirmed. Sterile prep, drape lumbar spine.  1% lido local L 3,4 by CRNA.  #24ga Pencan into clear CSF L 3,4 Glennon Mac, MD).  13.5mg  0.75% Bupivacaine with dextrose injected with asp CSF beginning and end of injection.  Patient asymptomatic, VSS, no heme aspirated, tolerated well.  Jenita Seashore, MD

## 2018-07-20 NOTE — Anesthesia Procedure Notes (Signed)
Anesthesia Regional Block: Adductor canal block   Pre-Anesthetic Checklist: ,, timeout performed, Correct Patient, Correct Site, Correct Laterality, Correct Procedure, Correct Position, site marked, Risks and benefits discussed,  Surgical consent,  Pre-op evaluation,  At surgeon's request and post-op pain management  Laterality: Left and Lower  Prep: chloraprep       Needles:  Injection technique: Single-shot  Needle Type: Echogenic Needle     Needle Length: 9cm  Needle Gauge: 21     Additional Needles:   Procedures:,,,, ultrasound used (permanent image in chart),,,,  Narrative:  Start time: 07/20/2018 8:18 AM End time: 07/20/2018 8:25 AM Injection made incrementally with aspirations every 5 mL.  Performed by: Personally  Anesthesiologist: Annye Asa, MD  Additional Notes: Pt identified in Holding room.  Monitors applied. Working IV access confirmed. Sterile prep, drape L thigh.  #21ga ECHOgenic needle into adductor canal with US guidance.  20cc 0.75% Ropivacaine injected incrementally after negative test dose.  Patient asymptomatic, VSS, no heme aspirated, tolerated well, Jenita Seashore, MD

## 2018-07-21 ENCOUNTER — Encounter (HOSPITAL_COMMUNITY): Payer: Self-pay | Admitting: Orthopedic Surgery

## 2018-07-21 DIAGNOSIS — M21062 Valgus deformity, not elsewhere classified, left knee: Secondary | ICD-10-CM | POA: Diagnosis not present

## 2018-07-21 DIAGNOSIS — Z96651 Presence of right artificial knee joint: Secondary | ICD-10-CM | POA: Diagnosis not present

## 2018-07-21 DIAGNOSIS — E785 Hyperlipidemia, unspecified: Secondary | ICD-10-CM | POA: Diagnosis not present

## 2018-07-21 DIAGNOSIS — D62 Acute posthemorrhagic anemia: Secondary | ICD-10-CM | POA: Diagnosis not present

## 2018-07-21 DIAGNOSIS — M25762 Osteophyte, left knee: Secondary | ICD-10-CM | POA: Diagnosis not present

## 2018-07-21 DIAGNOSIS — M1712 Unilateral primary osteoarthritis, left knee: Secondary | ICD-10-CM | POA: Diagnosis not present

## 2018-07-21 LAB — BASIC METABOLIC PANEL
Anion gap: 11 (ref 5–15)
BUN: 12 mg/dL (ref 8–23)
CO2: 22 mmol/L (ref 22–32)
Calcium: 8.7 mg/dL — ABNORMAL LOW (ref 8.9–10.3)
Chloride: 103 mmol/L (ref 98–111)
Creatinine, Ser: 0.64 mg/dL (ref 0.44–1.00)
GFR calc Af Amer: >60 mL/min (ref >60)
GFR calc non Af Amer: >60 mL/min (ref >60)
Glucose, Bld: 138 mg/dL — ABNORMAL HIGH (ref 70–99)
Potassium: 4.2 mmol/L (ref 3.5–5.1)
Sodium: 136 mmol/L (ref 135–145)

## 2018-07-21 LAB — CBC
HCT: 33.5 % — ABNORMAL LOW (ref 36.0–46.0)
Hemoglobin: 11 g/dL — ABNORMAL LOW (ref 12.0–15.0)
MCH: 31.4 pg (ref 26.0–34.0)
MCHC: 32.8 g/dL (ref 30.0–36.0)
MCV: 95.7 fL (ref 80.0–100.0)
Platelets: 276 10*3/uL (ref 150–400)
RBC: 3.5 MIL/uL — ABNORMAL LOW (ref 3.87–5.11)
RDW: 12.7 % (ref 11.5–15.5)
WBC: 15.6 10*3/uL — ABNORMAL HIGH (ref 4.0–10.5)
nRBC: 0 % (ref 0.0–0.2)

## 2018-07-21 NOTE — Progress Notes (Signed)
Physical Therapy Treatment Patient Details Name: Jennifer [name] MRN: [id number] DOB: 1950-02-21 Today's Date: 07/21/2018    History of Present Illness 68 yo female s/p L TKR on 07/20/18. PMH includes obesity, anxiety/depression, OSA, HLD, seizures, R TKR, R THA.    PT Comments    Progressing well with mobility. Increased pain on today compared to yesterday-moderate pain with activity. Reviewed/practiced exercises, gait training, and stair training. All education completed. Okay to d/c from PT standpoint-made RN aware.     Follow Up Recommendations  Follow surgeon's recommendation for DC plan and follow-up therapies;Supervision for mobility/OOB     Equipment Recommendations  3in1 (PT)    Recommendations for Other Services       Precautions / Restrictions Precautions Precautions: Fall Restrictions Weight Bearing Restrictions: No Other Position/Activity Restrictions: WBAT    Mobility  Bed Mobility Overal bed mobility: Needs Assistance Bed Mobility: Supine to Sit;Sit to Supine     Supine to sit: Min assist;HOB elevated Sit to supine: Min assist;HOB elevated   General bed mobility comments: Assist for L LE.  Transfers Overall transfer level: Needs assistance Equipment used: Rolling walker (2 wheeled) Transfers: Sit to/from Stand Sit to Stand: Min guard         General transfer comment: Min guard for safety, verbal cuing for hand placement when rising.  Ambulation/Gait Ambulation/Gait assistance: Min guard Gait Distance (Feet): 150 Feet Assistive device: Rolling walker (2 wheeled) Gait Pattern/deviations: Step-through pattern;Decreased stride length     General Gait Details: Min guard for safety.   Stairs Stairs: Yes Stairs assistance: Min guard Stair Management: Sideways;One rail Left Number of Stairs: 2 General stair comments: close guard for safety. VCs safety, technique (2 hands on L rail), sequence.   Wheelchair Mobility    Modified Rankin  (Stroke Patients Only)       Balance Overall balance assessment: Mild deficits observed, not formally tested                                          Cognition Arousal/Alertness: Awake/alert Behavior During Therapy: WFL for tasks assessed/performed Overall Cognitive Status: Within Functional Limits for tasks assessed                                        Exercises Total Joint Exercises Ankle Circles/Pumps: AROM;Both;10 reps;Supine Quad Sets: AROM;Both;10 reps;Supine Heel Slides: AAROM;Left;10 reps;Supine Hip ABduction/ADduction: AROM;Left;10 reps;Supine Straight Leg Raises: AAROM;AROM;Left;10 reps;Supine Goniometric ROM: ~10-70 degrees    General Comments        Pertinent Vitals/Pain Pain Assessment: 0-10 Pain Score: 5  Pain Location: L knee Pain Descriptors / Indicators: Aching;Throbbing;Sore Pain Intervention(s): Monitored during session;Ice applied;RN gave pain meds during session    Home Living                      Prior Function            PT Goals (current goals can now be found in the care plan section) Progress towards PT goals: Progressing toward goals    Frequency    7X/week      PT Plan Current plan remains appropriate    Co-evaluation              AM-PAC PT "6 Clicks" Mobility   Outcome Measure  Help needed  turning from your back to your side while in a flat bed without using bedrails?: A Little Help needed moving from lying on your back to sitting on the side of a flat bed without using bedrails?: A Little Help needed moving to and from a bed to a chair (including a wheelchair)?: A Little Help needed standing up from a chair using your arms (e.g., wheelchair or bedside chair)?: A Little Help needed to walk in hospital room?: A Little Help needed climbing 3-5 steps with a railing? : A Little 6 Click Score: 18    End of Session Equipment Utilized During Treatment: Gait belt Activity  Tolerance: Patient tolerated treatment well Patient left: in bed;with call bell/phone within reach   PT Visit Diagnosis: Other abnormalities of gait and mobility (R26.89);Difficulty in walking, not elsewhere classified (R26.2)     Time: 8466-5993 PT Time Calculation (min) (ACUTE ONLY): 30 min  Charges:  $Gait Training: 8-22 mins $Therapeutic Exercise: 8-22 mins                        Weston Anna, PT Acute Rehabilitation Services Pager: [phone] Office: [phone]

## 2018-07-21 NOTE — Discharge Summary (Signed)
Patient ID: Jennifer [name] MRN: [id number] DOB/AGE: 12-Jun-1950 68 y.o.  Admit date: 07/20/2018 Discharge date: 07/21/2018  Admission Diagnoses:  Principal Problem:   Degenerative arthritis of left knee Active Problems:   S/P total knee arthroplasty, left   Discharge Diagnoses:  Same  Past Medical History:  Diagnosis Date  . Allergy   . Anxiety   . Depression   . Dysrhythmia    FLB - ? PAC'S COMES AND GOES  . High cholesterol   . Hx of adenomatous polyp of colon    2012 - [place]  . Hyperlipidemia   . Hypertension   . Osteoarthritis   . PLMD (periodic limb movement disorder) 04/24/2012  . Seizures ([place])    08/29/13  . Sleep apnea    wears CPAP    Surgeries: Procedure(s): Left Knee Arthroplasty on 07/20/2018   Consultants:   Discharged Condition: Improved  Hospital Course: Jennifer [name] is an 68 y.o. female who was admitted 07/20/2018 for operative treatment ofDegenerative arthritis of left knee. Patient has severe unremitting pain that affects sleep, daily activities, and work/hobbies. After pre-op clearance the patient was taken to the operating room on 07/20/2018 and underwent  Procedure(s): Left Knee Arthroplasty.    Patient was given perioperative antibiotics:  Anti-infectives (From admission, onward)   Start     Dose/Rate Route Frequency Ordered Stop   07/20/18 0645  vancomycin (VANCOCIN) IVPB 1000 mg/200 mL premix     1,000 mg 200 mL/hr over 60 Minutes Intravenous On call to O.R. 07/20/18 8182 07/20/18 0910       Patient was given sequential compression devices, early ambulation, and chemoprophylaxis to prevent DVT.  Patient benefited maximally from hospital stay and there were no complications.    Recent vital signs:  Patient Vitals for the past 24 hrs:  BP Temp Temp src Pulse Resp SpO2 Height Weight  07/21/18 0527 128/67 97.6 F (36.4 C) no documentation 70 14 99 % no documentation no documentation  07/21/18 0109 131/69 97.9 F (36.6 C)  Oral 77 18 96 % no documentation no documentation  07/20/18 2115 no documentation no documentation no documentation 94 16 95 % no documentation no documentation  07/20/18 2043 139/67 97.6 F (36.4 C) no documentation 84 18 92 % no documentation no documentation  07/20/18 1739 (Abnormal) 149/85 98 F (36.7 C) no documentation 85 16 91 % no documentation no documentation  07/20/18 1534 (Abnormal) 141/69 97.8 F (36.6 C) no documentation 81 16 96 % no documentation no documentation  07/20/18 1445 133/67 97.6 F (36.4 C) no documentation 79 14 96 % no documentation no documentation  07/20/18 1323 129/71 (Abnormal) 97.5 F (36.4 C) Oral 78 16 97 % no documentation no documentation  07/20/18 1227 127/62 97.9 F (36.6 C) Oral 74 16 97 % 5\' 5"  (1.651 m) 97.5 kg  07/20/18 1200 119/63 98 F (36.7 C) no documentation 78 14 98 % no documentation no documentation  07/20/18 1145 97/78 no documentation no documentation 74 15 97 % no documentation no documentation  07/20/18 1130 120/63 no documentation no documentation 69 14 100 % no documentation no documentation  07/20/18 1115 132/70 no documentation no documentation 66 13 100 % no documentation no documentation  07/20/18 1105 (Abnormal) 113/58 98.4 F (36.9 C) no documentation (Abnormal) 59 12 97 % no documentation no documentation  07/20/18 0842 131/71 no documentation no documentation 65 14 99 % no documentation no documentation  07/20/18 0837 139/79 no documentation no documentation 65 15 100 % no documentation no  documentation  07/20/18 0832 (Abnormal) 146/74 no documentation no documentation 65 12 99 % no documentation no documentation  07/20/18 0827 (Abnormal) 144/71 no documentation no documentation 64 (Abnormal) 7 98 % no documentation no documentation  07/20/18 0822 138/73 no documentation no documentation 65 12 100 % no documentation no documentation  07/20/18 0817 (Abnormal) 143/74 no documentation no documentation 69 16 99 % no documentation  no documentation     Recent laboratory studies:  Recent Labs    07/21/18 0304  WBC 15.6*  HGB 11.0*  HCT 33.5*  PLT 276  NA 136  K 4.2  CL 103  CO2 22  BUN 12  CREATININE 0.64  GLUCOSE 138*  CALCIUM 8.7*     Discharge Medications:   Allergies as of 07/21/2018    Allergen Reactions Comment   Crestor [rosuvastatin] Other (See Comments) Muscle aches   Neomycin-bacitracin Zn-polymyx Swelling OPTHALMIC EXPOSURE ONLY - - SWELLING EYES   Neomycin-bacitracin-polymyxin [bacitracin-neomycin-polymyxin] Swelling Reaction to eye drops   Codeine Rash    Feldene [piroxicam] Rash    Penicillins Rash Has patient had a PCN reaction causing immediate rash, facial/tongue/throat swelling, SOB or lightheadedness with hypotension:Yes Has patient had a PCN reaction causing severe rash involving mucus membranes or skin necrosis: No Has patient had a PCN reaction that required hospitalization:No Has patient had a PCN reaction occurring within the last 10 years:No If all of the above answers are "NO", then may proceed with Cephalosporin use.   Piroxicam Rash    Sulfa Antibiotics Rash    Sulfonamide Derivatives Rash       Medication List    Stop taking these medications   aspirin 81 MG tablet Replaced by: aspirin EC 81 MG tablet     Take these medications   aspirin EC 81 MG tablet Take 1 tablet (81 mg total) by mouth 2 (two) times daily. Replaces: aspirin 81 MG tablet   Biotin 1000 MCG tablet Take 1,000 mcg by mouth daily.   CALCIUM CARBONATE PO Take 600 mg by mouth daily.   cetirizine 10 MG tablet Commonly known as: ZYRTEC Take 10 mg by mouth every evening.   citalopram 40 MG tablet Commonly known as: CELEXA TAKE 1 TABLET BY MOUTH EVERY DAY   hydroxypropyl methylcellulose / hypromellose 2.5 % ophthalmic solution Commonly known as: ISOPTO TEARS / GONIOVISC Place 1 drop into both eyes 2 (two) times daily as needed for dry eyes.   levETIRAcetam 500 MG tablet Commonly known as:  KEPPRA Take 1 tablet (500 mg total) by mouth 2 (two) times daily.   levothyroxine 50 MCG tablet Commonly known as: SYNTHROID Take 1 tablet (50 mcg total) by mouth daily.   losartan 50 MG tablet Commonly known as: COZAAR Take 1 tablet (50 mg total) by mouth daily.   MELATONIN PO Take 10 mg by mouth at bedtime.   MULTIPLE VITAMINS/WOMENS PO Take 1 tablet by mouth daily.   oxyCODONE-acetaminophen 5-325 MG tablet Commonly known as: PERCOCET/ROXICET Take 1 tablet by mouth every 4 (four) hours as needed for severe pain.   pravastatin 80 MG tablet Commonly known as: PRAVACHOL TAKE 1 TABLET BY MOUTH EVERY DAY   tiZANidine 2 MG tablet Commonly known as: ZANAFLEX Take 1 tablet (2 mg total) by mouth every 6 (six) hours as needed.        Durable Medical Equipment  (From admission, onward)         Start     Ordered   07/20/18 1222  DME Walker rolling  Once    Question:  Patient needs a walker to treat with the following condition  Answer:  Status post total left knee replacement   07/20/18 1221   07/20/18 1222  DME 3 n 1  Once     07/20/18 1221           Discharge Care Instructions  (From admission, onward)         Start     Ordered   07/21/18 0000  Change dressing    Comments: Change dressing Only if drainage exceeds 40% of window on dressing   07/21/18 0740          Diagnostic Studies: Dg Chest 2 View  Result Date: 07/16/2018 CLINICAL DATA:  Preop exam for knee surgery. EXAM: CHEST - 2 VIEW COMPARISON:  10/09/2015. FINDINGS: Mediastinum and hilar structures normal. Heart size normal. Lungs are clear. No pleural effusion or pneumothorax noted. Degenerative change thoracic spine. IMPRESSION: No acute cardiopulmonary disease. Electronically Signed   By: Marcello Moores  Register   On: 07/16/2018 15:33    Disposition: Discharge disposition: 01-Home or Self Care       Discharge Instructions    Call MD / Call 911   Complete by: As directed    If you experience chest  pain or shortness of breath, CALL 911 and be transported to the hospital emergency room.  If you develope a fever above 101 F, pus (white drainage) or increased drainage or redness at the wound, or calf pain, call your surgeon's office.   Change dressing   Complete by: As directed    Change dressing Only if drainage exceeds 40% of window on dressing   Constipation Prevention   Complete by: As directed    Drink plenty of fluids.  Prune juice may be helpful.  You may use a stool softener, such as Colace (over the counter) 100 mg twice a day.  Use MiraLax (over the counter) for constipation as needed.   Diet - low sodium heart healthy   Complete by: As directed    Increase activity slowly as tolerated   Complete by: As directed       Follow-up Information    Frederik Pear, MD. Go on 08/04/2018.   Specialty: Orthopedic Surgery Why: Your appointment is scheduled for 9:30.  Contact information: [place] [phone]        [facility] up.   Specialty: [place] Why: You will have 5 HHPT visits prior to starting Outpatient physical therapy  Contact information: [address] [place] [address] [place], [place]. Go on 08/04/2018.   Specialty: Physical Therapy Why: You are scheduled to start Outpatient physical therapy at 1:00. please arrive a few minutes early to complete paperwork.  NOTE----- location change to [facility] location. Contact information: [place] [address] [phone]        Frederik Pear, MD In 2 weeks.   Specialty: Orthopedic Surgery Contact information: [place] [address] [phone]            Signed: Kerin Salen 07/21/2018, 7:41 AM

## 2018-07-21 NOTE — Plan of Care (Signed)
Pt ready for DC home today 

## 2018-07-21 NOTE — Progress Notes (Signed)
PATIENT ID: Jennifer [name]  MRN: [id number]  DOB/AGE:  May 15, 1950 / 68 y.o.  1 Day Post-Op Procedure(s) (LRB): Left Knee Arthroplasty (Left)    PROGRESS NOTE Subjective: Patient is alert, oriented, No Nausea, No Vomiting, yes passing gas. Taking PO Well. Denies SOB, Chest or Calf Pain. Using Incentive Spirometer, PAS in place. Ambulate in hallway, Patient reports pain as 3/10 .    Objective: Vital signs in last 24 hours: Vitals:   07/20/18 2043 07/20/18 2115 07/21/18 0109 07/21/18 0527  BP: 139/67  131/69 128/67  Pulse: 84 94 77 70  Resp: 18 16 18 14   Temp: 97.6 F (36.4 C)  97.9 F (36.6 C) 97.6 F (36.4 C)  TempSrc:   Oral   SpO2: 92% 95% 96% 99%  Weight:      Height:          Intake/Output from previous day: I/O last 3 completed shifts: In: 4031.8 [P.O.:960; I.V.:2871.8; IV Piggyback:200] Out: 1025 [Urine:5025; Blood:100]   Intake/Output this shift: No intake/output data recorded.   LABORATORY DATA: Recent Labs    07/21/18 0304  WBC 15.6*  HGB 11.0*  HCT 33.5*  PLT 276  NA 136  K 4.2  CL 103  CO2 22  BUN 12  CREATININE 0.64  GLUCOSE 138*  CALCIUM 8.7*    Examination: Neurologically intact ABD soft Neurovascular intact Sensation intact distally Intact pulses distally Dorsiflexion/Plantar flexion intact Incision: dressing C/D/I No cellulitis present Compartment soft good SLR}  Assessment:   1 Day Post-Op Procedure(s) (LRB): Left Knee Arthroplasty (Left) ADDITIONAL DIAGNOSIS: Expected Acute Blood Loss Anemia, Sleep Apnea  Patient's anticipated LOS is less than 2 midnights, meeting these requirements: - Younger than 61 - Lives within 1 hour of care - Has a competent adult at home to recover with post-op recover - NO history of  - Chronic pain requiring opiods  - Diabetes  - Coronary Artery Disease  - Heart failure  - Heart attack  - Stroke  - DVT/VTE  - Cardiac arrhythmia  - Respiratory Failure/COPD  - Renal failure  -  Anemia  - Advanced Liver disease       Plan: PT/OT WBAT, AROM and PROM  DVT Prophylaxis:  SCDx72hrs, ASA 81 mg BID x 2 weeks DISCHARGE PLAN: Home today DISCHARGE NEEDS: HHPT, Walker and 3-in-1 comode seat     Kerin Salen 07/21/2018, 7:36 AM

## 2018-07-22 DIAGNOSIS — E785 Hyperlipidemia, unspecified: Secondary | ICD-10-CM | POA: Diagnosis not present

## 2018-07-22 DIAGNOSIS — K5900 Constipation, unspecified: Secondary | ICD-10-CM | POA: Diagnosis not present

## 2018-07-22 DIAGNOSIS — G4733 Obstructive sleep apnea (adult) (pediatric): Secondary | ICD-10-CM | POA: Diagnosis not present

## 2018-07-22 DIAGNOSIS — G40009 Localization-related (focal) (partial) idiopathic epilepsy and epileptic syndromes with seizures of localized onset, not intractable, without status epilepticus: Secondary | ICD-10-CM | POA: Diagnosis not present

## 2018-07-22 DIAGNOSIS — Z6835 Body mass index (BMI) 35.0-35.9, adult: Secondary | ICD-10-CM | POA: Diagnosis not present

## 2018-07-22 DIAGNOSIS — I10 Essential (primary) hypertension: Secondary | ICD-10-CM | POA: Diagnosis not present

## 2018-07-22 DIAGNOSIS — Z86010 Personal history of colonic polyps: Secondary | ICD-10-CM | POA: Diagnosis not present

## 2018-07-22 DIAGNOSIS — M1611 Unilateral primary osteoarthritis, right hip: Secondary | ICD-10-CM | POA: Diagnosis not present

## 2018-07-22 DIAGNOSIS — F329 Major depressive disorder, single episode, unspecified: Secondary | ICD-10-CM | POA: Diagnosis not present

## 2018-07-22 DIAGNOSIS — Z96652 Presence of left artificial knee joint: Secondary | ICD-10-CM | POA: Diagnosis not present

## 2018-07-22 DIAGNOSIS — E669 Obesity, unspecified: Secondary | ICD-10-CM | POA: Diagnosis not present

## 2018-07-22 DIAGNOSIS — Z471 Aftercare following joint replacement surgery: Secondary | ICD-10-CM | POA: Diagnosis not present

## 2018-07-24 DIAGNOSIS — G40009 Localization-related (focal) (partial) idiopathic epilepsy and epileptic syndromes with seizures of localized onset, not intractable, without status epilepticus: Secondary | ICD-10-CM | POA: Diagnosis not present

## 2018-07-24 DIAGNOSIS — Z471 Aftercare following joint replacement surgery: Secondary | ICD-10-CM | POA: Diagnosis not present

## 2018-07-24 DIAGNOSIS — G4733 Obstructive sleep apnea (adult) (pediatric): Secondary | ICD-10-CM | POA: Diagnosis not present

## 2018-07-24 DIAGNOSIS — I10 Essential (primary) hypertension: Secondary | ICD-10-CM | POA: Diagnosis not present

## 2018-07-24 DIAGNOSIS — M1611 Unilateral primary osteoarthritis, right hip: Secondary | ICD-10-CM | POA: Diagnosis not present

## 2018-07-24 DIAGNOSIS — E785 Hyperlipidemia, unspecified: Secondary | ICD-10-CM | POA: Diagnosis not present

## 2018-07-25 DIAGNOSIS — Z471 Aftercare following joint replacement surgery: Secondary | ICD-10-CM | POA: Diagnosis not present

## 2018-07-25 DIAGNOSIS — E785 Hyperlipidemia, unspecified: Secondary | ICD-10-CM | POA: Diagnosis not present

## 2018-07-25 DIAGNOSIS — M1611 Unilateral primary osteoarthritis, right hip: Secondary | ICD-10-CM | POA: Diagnosis not present

## 2018-07-25 DIAGNOSIS — G40009 Localization-related (focal) (partial) idiopathic epilepsy and epileptic syndromes with seizures of localized onset, not intractable, without status epilepticus: Secondary | ICD-10-CM | POA: Diagnosis not present

## 2018-07-25 DIAGNOSIS — G4733 Obstructive sleep apnea (adult) (pediatric): Secondary | ICD-10-CM | POA: Diagnosis not present

## 2018-07-25 DIAGNOSIS — I10 Essential (primary) hypertension: Secondary | ICD-10-CM | POA: Diagnosis not present

## 2018-07-27 DIAGNOSIS — G4733 Obstructive sleep apnea (adult) (pediatric): Secondary | ICD-10-CM | POA: Diagnosis not present

## 2018-07-27 DIAGNOSIS — Z471 Aftercare following joint replacement surgery: Secondary | ICD-10-CM | POA: Diagnosis not present

## 2018-07-27 DIAGNOSIS — E785 Hyperlipidemia, unspecified: Secondary | ICD-10-CM | POA: Diagnosis not present

## 2018-07-27 DIAGNOSIS — G40009 Localization-related (focal) (partial) idiopathic epilepsy and epileptic syndromes with seizures of localized onset, not intractable, without status epilepticus: Secondary | ICD-10-CM | POA: Diagnosis not present

## 2018-07-27 DIAGNOSIS — M1611 Unilateral primary osteoarthritis, right hip: Secondary | ICD-10-CM | POA: Diagnosis not present

## 2018-07-27 DIAGNOSIS — I10 Essential (primary) hypertension: Secondary | ICD-10-CM | POA: Diagnosis not present

## 2018-07-29 DIAGNOSIS — M1611 Unilateral primary osteoarthritis, right hip: Secondary | ICD-10-CM | POA: Diagnosis not present

## 2018-07-29 DIAGNOSIS — E785 Hyperlipidemia, unspecified: Secondary | ICD-10-CM | POA: Diagnosis not present

## 2018-07-29 DIAGNOSIS — I10 Essential (primary) hypertension: Secondary | ICD-10-CM | POA: Diagnosis not present

## 2018-07-29 DIAGNOSIS — Z471 Aftercare following joint replacement surgery: Secondary | ICD-10-CM | POA: Diagnosis not present

## 2018-07-29 DIAGNOSIS — G40009 Localization-related (focal) (partial) idiopathic epilepsy and epileptic syndromes with seizures of localized onset, not intractable, without status epilepticus: Secondary | ICD-10-CM | POA: Diagnosis not present

## 2018-07-29 DIAGNOSIS — G4733 Obstructive sleep apnea (adult) (pediatric): Secondary | ICD-10-CM | POA: Diagnosis not present

## 2018-08-04 DIAGNOSIS — Z9889 Other specified postprocedural states: Secondary | ICD-10-CM | POA: Diagnosis not present

## 2018-08-04 DIAGNOSIS — Z96652 Presence of left artificial knee joint: Secondary | ICD-10-CM | POA: Diagnosis not present

## 2018-08-04 DIAGNOSIS — R531 Weakness: Secondary | ICD-10-CM | POA: Diagnosis not present

## 2018-08-04 DIAGNOSIS — M1712 Unilateral primary osteoarthritis, left knee: Secondary | ICD-10-CM | POA: Diagnosis not present

## 2018-08-05 ENCOUNTER — Ambulatory Visit (INDEPENDENT_AMBULATORY_CARE_PROVIDER_SITE_OTHER): Payer: Medicare Other | Admitting: Family Medicine

## 2018-08-05 ENCOUNTER — Ambulatory Visit (INDEPENDENT_AMBULATORY_CARE_PROVIDER_SITE_OTHER): Payer: Medicare Other | Admitting: Psychology

## 2018-08-05 ENCOUNTER — Other Ambulatory Visit: Payer: Self-pay

## 2018-08-05 ENCOUNTER — Encounter: Payer: Self-pay | Admitting: Family Medicine

## 2018-08-05 DIAGNOSIS — S30810A Abrasion of lower back and pelvis, initial encounter: Secondary | ICD-10-CM

## 2018-08-05 DIAGNOSIS — F320 Major depressive disorder, single episode, mild: Secondary | ICD-10-CM | POA: Diagnosis not present

## 2018-08-05 NOTE — Assessment & Plan Note (Signed)
Expect this was due to friction /stool exposure when pt briefly wore adult undergarment at time of her knee surgery  It is improving on its own No s/s of infection  Recommend cleaning with soap and water several times daily  Barrier product like A and D or Desitin  Watch for redness/swelling/pain /increase in size Avoid friction  Gently cover if needed Update if not starting to improve in a week or if worsening

## 2018-08-05 NOTE — Progress Notes (Signed)
Subjective:    Patient ID: Jennifer [name], female    DOB: 1950-11-03, 68 y.o.   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 Abrasion of left buttock 08/05/2018  . S/P total knee arthroplasty, left 07/20/2018  . Degenerative arthritis of left knee 07/17/2018  . Chronic constipation 12/20/2017  . Class 1 obesity due to excess calories without serious comorbidity with body mass index (BMI) of 34.0 to 34.9 in adult 12/20/2017  . Primary osteoarthritis of right hip 10/20/2015  . Arthritis of right hip 10/20/2015  . Localization-related idiopathic epilepsy and epileptic syndromes with seizures of localized onset, not intractable, without status epilepticus ([place]) 09/09/2014  . Depression 07/19/2014  . Hx of adenomatous polyp of colon 03/29/2014  . Seizure after head injury ([place]) 08/29/2013  . OSA (obstructive sleep apnea) 04/24/2012  . PLMD (periodic limb movement disorder) 04/24/2012  . Fibroids, submucosal 12/18/2010  . HYPERLIPIDEMIA 07/04/2008  . ESSENTIAL HYPERTENSION, BENIGN 07/04/2008  . BAKER'S CYST, LEFT KNEE 07/04/2008  . Shortened PR interval 07/04/2008   Past Medical History:  Diagnosis Date  . Allergy   . Anxiety   . Depression   . Dysrhythmia    FLB - ? PAC'S COMES AND  GOES  . High cholesterol   . Hx of adenomatous polyp of colon    2012 - [place]  . Hyperlipidemia   . Hypertension   . Osteoarthritis   . PLMD (periodic limb movement disorder) 04/24/2012  . Seizures ([place])    08/29/13  . Sleep apnea    wears CPAP   Past Surgical History:  Procedure Laterality Date  . APPENDECTOMY    . CESAREAN SECTION    . CHOLECYSTECTOMY  1978  . COLONOSCOPY    . DILATION AND CURETTAGE OF UTERUS    . JOINT REPLACEMENT  1998   right knee  . OSTEOTOMY PROXIMAL FEMORAL    . 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   Procedure: TOTAL HIP ARTHROPLASTY ANTERIOR APPROACH;  Surgeon: Frederik Pear, MD;  Location: [facility];  Service: Orthopedics;  Laterality: Right;  . TOTAL KNEE ARTHROPLASTY Left 07/20/2018   Procedure: Left Knee Arthroplasty;  Surgeon: Frederik Pear, MD;  Location: [facility];  Service: Orthopedics;  Laterality: Left;   Social History   Tobacco Use  . Smoking status: Never Smoker  . Smokeless tobacco: Never Used  Substance Use Topics  . Alcohol use: No    Alcohol/week: 0.0 standard drinks  . Drug use: No   Family History  Problem Relation Age of Onset  . Cancer Father 12  . Esophageal cancer Father   . Heart disease Mother 69  Aortic valve disease  . Atrial fibrillation Brother   . Sleep apnea Brother   . Sleep apnea Sister   . Sleep apnea Sister   . Stomach cancer Maternal Grandmother   . Colon cancer Neg Hx   . Rectal cancer Neg Hx    Allergies  Allergen Reactions  . Crestor [Rosuvastatin] Other (See Comments)    Muscle aches  . Neomycin-Bacitracin Zn-Polymyx Swelling    OPTHALMIC EXPOSURE ONLY - - SWELLING EYES   . Neomycin-Bacitracin-Polymyxin [Bacitracin-Neomycin-Polymyxin] Swelling    Reaction to eye drops  . Codeine Rash  . Feldene [Piroxicam] Rash  . Penicillins Rash    Has patient had a PCN reaction causing immediate rash, facial/tongue/throat swelling, SOB or lightheadedness with hypotension:Yes Has patient had a PCN  reaction causing severe rash involving mucus membranes or skin necrosis: No Has patient had a PCN reaction that required hospitalization:No Has patient had a PCN reaction occurring within the last 10 years:No If all of the above answers are "NO", then may proceed with Cephalosporin use.   . Piroxicam Rash  . Sulfa Antibiotics Rash  . Sulfonamide Derivatives Rash   Current Outpatient Medications on File Prior to Visit  Medication Sig Dispense Refill  . aspirin EC 81 MG tablet Take 1 tablet (81 mg total) by mouth 2 (two) times daily. 60 tablet 0  . Biotin 1000 MCG tablet Take 1,000 mcg by mouth daily.    Marland Kitchen CALCIUM CARBONATE PO Take 600 mg by mouth daily.     . cetirizine (ZYRTEC) 10 MG tablet Take 10 mg by mouth every evening.     . citalopram (CELEXA) 40 MG tablet TAKE 1 TABLET BY MOUTH EVERY DAY (Patient taking differently: Take 40 mg by mouth daily. ) 90 tablet 1  . hydroxypropyl methylcellulose / hypromellose (ISOPTO TEARS / GONIOVISC) 2.5 % ophthalmic solution Place 1 drop into both eyes 2 (two) times daily as needed for dry eyes.    Marland Kitchen levETIRAcetam (KEPPRA) 500 MG tablet Take 1 tablet (500 mg total) by mouth 2 (two) times daily. 180 tablet 3  . levothyroxine (SYNTHROID, LEVOTHROID) 50 MCG tablet Take 1 tablet (50 mcg total) by mouth daily. 90 tablet 3  . losartan (COZAAR) 50 MG tablet Take 1 tablet (50 mg total) by mouth daily. 90 tablet 3  . MELATONIN PO Take 10 mg by mouth at bedtime.     . Multiple Vitamins-Minerals (MULTIPLE VITAMINS/WOMENS PO) Take 1 tablet by mouth daily.     Marland Kitchen oxyCODONE-acetaminophen (PERCOCET/ROXICET) 5-325 MG tablet Take 1 tablet by mouth every 4 (four) hours as needed for severe pain. 30 tablet 0  . pravastatin (PRAVACHOL) 80 MG tablet TAKE 1 TABLET BY MOUTH EVERY DAY (Patient taking differently: Take 80 mg by mouth daily. ) 90 tablet 1  . tiZANidine (ZANAFLEX) 2 MG tablet Take 1 tablet (2 mg total) by mouth every 6 (six) hours as needed. 60 tablet 0   No  current facility-administered medications on file prior to visit.      Review of Systems  Constitutional: Negative for activity change, appetite change, fatigue, fever and unexpected weight change.  HENT: Negative for congestion, ear pain, rhinorrhea, sinus pressure and sore throat.   Eyes: Negative for pain, redness and visual disturbance.  Respiratory: Negative for cough, shortness of breath and wheezing.   Cardiovascular: Negative for chest pain and palpitations.  Gastrointestinal: Negative for abdominal pain, anal bleeding, blood in stool, constipation, diarrhea and rectal pain.  Endocrine: Negative for polydipsia and polyuria.  Genitourinary:  Negative for dysuria, frequency and urgency.  Musculoskeletal: Negative for arthralgias, back pain and myalgias.       L knee pain post surgical  Mild   Skin: Positive for wound. Negative for pallor and rash.       Near anal area on R  Allergic/Immunologic: Negative for environmental allergies.  Neurological: Negative for dizziness, syncope and headaches.  Hematological: Negative for adenopathy. Does not bruise/bleed easily.  Psychiatric/Behavioral: Negative for decreased concentration and dysphoric mood. The patient is not nervous/anxious.        Objective:   Physical Exam Constitutional:      General: She is not in acute distress.    Appearance: Normal appearance. She is obese.  Eyes:     General: No scleral icterus.    Extraocular Movements: Extraocular movements intact.     Conjunctiva/sclera: Conjunctivae normal.     Pupils: Pupils are equal, round, and reactive to light.  Neck:     Musculoskeletal: Normal range of motion.  Cardiovascular:     Rate and Rhythm: Normal rate and regular rhythm.     Heart sounds: Normal heart sounds.  Pulmonary:     Effort: Pulmonary effort is normal. No respiratory distress.     Breath sounds: Normal breath sounds. No wheezing or rales.  Abdominal:     Palpations: Abdomen is soft.      Tenderness: There is no abdominal tenderness.  Genitourinary:    Rectum: Normal.  Lymphadenopathy:     Cervical: No cervical adenopathy.  Skin:    General: Skin is warm and dry.     Findings: No erythema or rash.     Comments: L inner buttock- 1 cm round area of irritation /abrasion with scant skin debridement  No erythema , no swelling or induration  No fluctuance or drainage This does not resemble an ulcer   No ext hemorrhoids seen   Healing incision from R knee replacement is healing well  Some irritation surrounding from bandage adhesive  Neurological:     Mental Status: She is alert.  Psychiatric:        Mood and Affect: Mood normal.           Assessment & Plan:   Problem List Items Addressed This Visit      Musculoskeletal and Integument   Abrasion of left buttock    Expect this was due to friction /stool exposure when pt briefly wore adult undergarment at time of her knee surgery  It is improving on its own No s/s of infection  Recommend cleaning with soap and water several times daily  Barrier product like A and D or Desitin  Watch for redness/swelling/pain /increase in size Avoid friction  Gently cover if needed Update if not starting to improve in a week or if worsening

## 2018-08-05 NOTE — Patient Instructions (Signed)
Keep area clean with gentle soap and water  Protect from stool/urine etc  Avoid friction (a gauze pad is fine if needed)   A barrier cream like Desitin or A and D ointment is helpful  Vaseline is also ok   Watch for -increased pain / size/redness/swelling  Avoid prolonged sitting   Keep [place] posted

## 2018-08-06 ENCOUNTER — Ambulatory Visit: Payer: Medicare Other | Admitting: Neurology

## 2018-08-06 DIAGNOSIS — Z803 Family history of malignant neoplasm of breast: Secondary | ICD-10-CM | POA: Diagnosis not present

## 2018-08-06 DIAGNOSIS — Z1231 Encounter for screening mammogram for malignant neoplasm of breast: Secondary | ICD-10-CM | POA: Diagnosis not present

## 2018-08-06 LAB — HM MAMMOGRAPHY

## 2018-08-10 DIAGNOSIS — M1712 Unilateral primary osteoarthritis, left knee: Secondary | ICD-10-CM | POA: Diagnosis not present

## 2018-08-10 DIAGNOSIS — R531 Weakness: Secondary | ICD-10-CM | POA: Diagnosis not present

## 2018-08-13 ENCOUNTER — Encounter: Payer: Self-pay | Admitting: Family Medicine

## 2018-08-13 DIAGNOSIS — R531 Weakness: Secondary | ICD-10-CM | POA: Diagnosis not present

## 2018-08-13 DIAGNOSIS — M1712 Unilateral primary osteoarthritis, left knee: Secondary | ICD-10-CM | POA: Diagnosis not present

## 2018-08-17 DIAGNOSIS — M1712 Unilateral primary osteoarthritis, left knee: Secondary | ICD-10-CM | POA: Diagnosis not present

## 2018-08-17 DIAGNOSIS — R531 Weakness: Secondary | ICD-10-CM | POA: Diagnosis not present

## 2018-08-19 ENCOUNTER — Ambulatory Visit (INDEPENDENT_AMBULATORY_CARE_PROVIDER_SITE_OTHER): Payer: Medicare Other | Admitting: Psychology

## 2018-08-19 DIAGNOSIS — F320 Major depressive disorder, single episode, mild: Secondary | ICD-10-CM | POA: Diagnosis not present

## 2018-08-20 DIAGNOSIS — M1712 Unilateral primary osteoarthritis, left knee: Secondary | ICD-10-CM | POA: Diagnosis not present

## 2018-08-20 DIAGNOSIS — R531 Weakness: Secondary | ICD-10-CM | POA: Diagnosis not present

## 2018-08-24 ENCOUNTER — Other Ambulatory Visit: Payer: Self-pay

## 2018-08-24 ENCOUNTER — Encounter: Payer: Self-pay | Admitting: Neurology

## 2018-08-24 ENCOUNTER — Ambulatory Visit (INDEPENDENT_AMBULATORY_CARE_PROVIDER_SITE_OTHER): Payer: Medicare Other | Admitting: Neurology

## 2018-08-24 DIAGNOSIS — G40009 Localization-related (focal) (partial) idiopathic epilepsy and epileptic syndromes with seizures of localized onset, not intractable, without status epilepticus: Secondary | ICD-10-CM

## 2018-08-24 DIAGNOSIS — M1712 Unilateral primary osteoarthritis, left knee: Secondary | ICD-10-CM | POA: Diagnosis not present

## 2018-08-24 DIAGNOSIS — R531 Weakness: Secondary | ICD-10-CM | POA: Diagnosis not present

## 2018-08-24 MED ORDER — LEVETIRACETAM 500 MG PO TABS: 500.0000 mg | ORAL_TABLET | Freq: Two times a day (BID) | ORAL | 3 refills | Status: DC
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
## 2018-08-25 ENCOUNTER — Ambulatory Visit (INDEPENDENT_AMBULATORY_CARE_PROVIDER_SITE_OTHER): Payer: Medicare Other | Admitting: Family Medicine

## 2018-08-25 ENCOUNTER — Encounter (INDEPENDENT_AMBULATORY_CARE_PROVIDER_SITE_OTHER): Payer: Self-pay | Admitting: Family Medicine

## 2018-08-25 ENCOUNTER — Other Ambulatory Visit (INDEPENDENT_AMBULATORY_CARE_PROVIDER_SITE_OTHER): Payer: Self-pay | Admitting: Family Medicine

## 2018-08-25 VITALS — BP 121/74 | HR 80 | Temp 98.1°F | Ht 63.0 in | Wt 208.0 lb

## 2018-08-25 DIAGNOSIS — E038 Other specified hypothyroidism: Secondary | ICD-10-CM | POA: Diagnosis not present

## 2018-08-25 DIAGNOSIS — Z6836 Body mass index (BMI) 36.0-36.9, adult: Secondary | ICD-10-CM | POA: Diagnosis not present

## 2018-08-25 DIAGNOSIS — E7849 Other hyperlipidemia: Secondary | ICD-10-CM | POA: Diagnosis not present

## 2018-08-25 DIAGNOSIS — R0602 Shortness of breath: Secondary | ICD-10-CM | POA: Diagnosis not present

## 2018-08-25 DIAGNOSIS — R739 Hyperglycemia, unspecified: Secondary | ICD-10-CM | POA: Diagnosis not present

## 2018-08-25 DIAGNOSIS — Z1331 Encounter for screening for depression: Secondary | ICD-10-CM

## 2018-08-25 DIAGNOSIS — E559 Vitamin D deficiency, unspecified: Secondary | ICD-10-CM

## 2018-08-25 DIAGNOSIS — Z0289 Encounter for other administrative examinations: Secondary | ICD-10-CM

## 2018-08-25 DIAGNOSIS — I10 Essential (primary) hypertension: Secondary | ICD-10-CM

## 2018-08-25 DIAGNOSIS — R5383 Other fatigue: Secondary | ICD-10-CM | POA: Diagnosis not present

## 2018-08-26 DIAGNOSIS — R531 Weakness: Secondary | ICD-10-CM | POA: Diagnosis not present

## 2018-08-26 DIAGNOSIS — M1712 Unilateral primary osteoarthritis, left knee: Secondary | ICD-10-CM | POA: Diagnosis not present

## 2018-08-26 LAB — LIPID PANEL WITH LDL/HDL RATIO
Cholesterol, Total: 281 mg/dL — ABNORMAL HIGH (ref 100–199)
HDL: 40 mg/dL (ref >39)
LDL Calculated: 172 mg/dL — ABNORMAL HIGH (ref 0–99)
LDl/HDL Ratio: 4.3 ratio — ABNORMAL HIGH (ref 0.0–3.2)
Triglycerides: 345 mg/dL — ABNORMAL HIGH (ref 0–149)
VLDL Cholesterol Cal: 69 mg/dL — ABNORMAL HIGH (ref 5–40)

## 2018-08-26 LAB — COMPREHENSIVE METABOLIC PANEL
ALT: 24 IU/L (ref 0–32)
AST: 25 IU/L (ref 0–40)
Albumin/Globulin Ratio: 1.9 (ref 1.2–2.2)
Albumin: 4.5 g/dL (ref 3.8–4.8)
Alkaline Phosphatase: 124 IU/L — ABNORMAL HIGH (ref 39–117)
BUN/Creatinine Ratio: 16 (ref 12–28)
BUN: 12 mg/dL (ref 8–27)
Bilirubin Total: 0.5 mg/dL (ref 0.0–1.2)
CO2: 22 mmol/L (ref 20–29)
Calcium: 9.9 mg/dL (ref 8.7–10.3)
Chloride: 97 mmol/L (ref 96–106)
Creatinine, Ser: 0.74 mg/dL (ref 0.57–1.00)
GFR calc Af Amer: 97 mL/min/{1.73_m2} (ref >59)
GFR calc non Af Amer: 84 mL/min/{1.73_m2} (ref >59)
Globulin, Total: 2.4 g/dL (ref 1.5–4.5)
Glucose: 74 mg/dL (ref 65–99)
Potassium: 4.3 mmol/L (ref 3.5–5.2)
Sodium: 139 mmol/L (ref 134–144)
Total Protein: 6.9 g/dL (ref 6.0–8.5)

## 2018-08-26 LAB — VITAMIN D 25 HYDROXY (VIT D DEFICIENCY, FRACTURES): Vit D, 25-Hydroxy: 26.4 ng/mL — ABNORMAL LOW (ref 30.0–100.0)

## 2018-08-26 LAB — HEMOGLOBIN A1C
Est. average glucose Bld gHb Est-mCnc: 108 mg/dL
Hgb A1c MFr Bld: 5.4 % (ref 4.8–5.6)

## 2018-08-26 LAB — T3: T3, Total: 104 ng/dL (ref 71–180)

## 2018-08-26 LAB — T4, FREE: Free T4: 1.04 ng/dL (ref 0.82–1.77)

## 2018-08-26 LAB — TSH: TSH: 2.38 u[IU]/mL (ref 0.450–4.500)

## 2018-08-26 LAB — INSULIN, RANDOM: INSULIN: 14.7 u[IU]/mL (ref 2.6–24.9)
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
## 2018-08-26 NOTE — Progress Notes (Signed)
Office: [phone]  /  Fax: [phone]   Dear Jennifer Reamer, FNP,   Thank you for referring Jennifer [name] to our clinic. The following note includes my evaluation and treatment recommendations.  HPI:   Chief Complaint: OBESITY    Jennifer [name] has been referred by Jennifer Reamer, FNP for consultation regarding her obesity and obesity related comorbidities.    Jennifer [name] (MR# [id number]) is a 68 y.o. female who presents on 08/26/2018 for obesity evaluation and treatment. Current BMI is Body mass index is 36.85 kg/m.Marland Kitchen Jennifer [name] has been struggling with her weight for many years and has been unsuccessful in either losing weight, maintaining weight loss, or reaching her healthy weight goal.     Jennifer [name] attended our information session and states she is currently in the action stage of change and ready to dedicate time achieving and maintaining a healthier weight. Jennifer [name] is interested in becoming our patient and working on intensive lifestyle modifications including (but not limited to) diet, exercise and weight loss.    Jennifer [name] states she struggles with family and or coworkers weight loss sabotage she has been heavy most of her life her heaviest weight ever was 215 lbs. she has significant food cravings issues  she snacks frequently in the evenings she skips meals frequently (breakfast or lunch) she frequently makes poor food choices she frequently eats larger portions than normal  she struggles with emotional eating    Jennifer [name] feels her energy is lower than it should be. This has worsened with weight gain and has worsened recently. Jennifer [name] admits to daytime somnolence and [name] waking up still tired. Patient is at risk for obstructive sleep apnea. Patient generally gets 7-8 hours of sleep per night, and states they generally have restful sleep. Snoring is not present. Apneic episodes are present - is getting CPAP. Epworth Sleepiness Score is 9.   Dyspnea on exertion Jennifer [name] notes increasing shortness of breath with exercising and seems to be worsening over time with weight gain. She notes getting out of breath sooner with activity than she used to. This has gotten worse recently. Jennifer [name] orthopnea.  Hyperglycemia Jennifer [name] has a history of some elevated fasting blood glucose readings without a diagnosis of diabetes. No recent A1c. She admits to polyphagia.  Hypertension Jennifer [name] is a 68 y.o. female with hypertension. Jennifer [name] [name] chest pain, headache, or dizziness. She is working weight loss to help control her blood pressure with the goal of decreasing her risk of heart attack and stroke. Jennifer [name]'s blood pressure is stable on medications.  Hyperlipidemia Jennifer [name] has hyperlipidemia and has been trying to improve her cholesterol levels with intensive lifestyle modification including a low saturated fat diet, exercise and weight loss. She is on a statin and [name] any chest pain.  Vitamin D deficiency Jennifer [name] has a diagnosis of Vitamin D deficiency. She has no recent labs but it has been very low in the past.  Hypothyroidism Jennifer [name] has a diagnosis of hypothyroidism. She is stable on Synthroid. She [name] palpitations but does admit to ongoing Jennifer.  Depression Screen Jennifer [name]'s Food and Mood (modified PHQ-9) score was 11. Depression screen PHQ 2/9 08/25/2018  Decreased Interest 1  Down, Depressed, Hopeless 2  PHQ - 2 Score 3  Altered sleeping 1  Tired, decreased energy 2  Change in appetite 2  Feeling bad or failure about yourself  3  Trouble concentrating 0  Moving slowly or fidgety/restless 0  Suicidal thoughts 0  PHQ-9 Score 11  Difficult doing work/chores Not difficult at all  Some encounter information is confidential and restricted. Go to Review Flowsheets activity to see all data.   ASSESSMENT AND PLAN:  Other Jennifer  Shortness of breath on exertion  Hyperglycemia - Plan:  Hemoglobin A1c, Insulin, random, Comprehensive metabolic panel  Essential hypertension  Other hyperlipidemia - Plan: Lipid Panel With LDL/HDL Ratio  Vitamin D deficiency - Plan: VITAMIN D 25 Hydroxy (Vit-D Deficiency, Fractures)  Other specified hypothyroidism - Plan: T4, free, T3, TSH  Depression screening  Class 2 severe obesity with serious comorbidity and body mass index (BMI) of 36.0 to 36.9 in adult, unspecified obesity type (HCC)  PLAN:  Jennifer [name] was informed that her Jennifer may be related to obesity, depression or many other causes. Labs will be ordered, and in the meanwhile Jennifer [name] has agreed to work on diet, exercise and weight loss to help with Jennifer. Proper sleep hygiene was discussed including the need for 7-8 hours of quality sleep each night. A sleep study was not ordered based on symptoms and Epworth score.  Dyspnea on exertion Jennifer [name]'s shortness of breath appears to be obesity related and exercise induced. She has agreed to work on weight loss and gradually increase exercise to treat her exercise induced shortness of breath. If Jennifer [name] follows our instructions and loses weight without improvement of her shortness of breath, we will plan to refer to pulmonology. We will monitor this condition regularly. Jennifer [name] agrees to this plan.  Hyperglycemia Fasting labs will be obtained and results with be discussed with Jennifer [name] in 2 weeks at her follow up visit. In the meanwhile Jennifer [name] will start on her diet prescription and will have labs checked today.  Hypertension We discussed sodium restriction, working on healthy weight loss, and a regular exercise program as the means to achieve improved blood pressure control. Jennifer [name] agreed with this plan and agreed to follow up as directed. We will continue to monitor her blood pressure as well as her progress with the above lifestyle modifications. She will have labs checked and will watch for signs of hypotension as she  continues her lifestyle modifications.  Hyperlipidemia Jennifer [name] was informed of the American Heart Association Guidelines emphasizing intensive lifestyle modifications as the first line treatment for hyperlipidemia. We discussed many lifestyle modifications today in depth, and Kelli will continue to work on decreasing saturated fats such as fatty red meat, butter and many fried foods. She will have labs checked, increase vegetables and lean protein in her diet, and continue to work on exercise and weight loss efforts.  Vitamin D Deficiency Novella was informed that low Vitamin D levels contributes to Jennifer and are associated with obesity, breast, and colon cancer. She will have labs checked today and agrees to follow-up with our clinic in 2 weeks.  Hypothyroidism Atonya was informed of the importance of good thyroid control to help with weight loss efforts. She was also informed that supertheraputic thyroid levels are dangerous and will not improve weight loss results. Carleena will have labs checked today and will continue to follow.  Depression Screen Jennifer [name] had a moderately positive depression screening. Depression is commonly associated with obesity and often results in emotional eating behaviors. We will monitor this closely and work on CBT to help improve the non-hunger eating patterns. Referral to Psychology may be required if no improvement is seen as she continues in our clinic.  Obesity Verania is currently in the action stage of change and her goal is to continue with weight loss efforts.  I recommend Jennifer [name] begin the structured treatment plan as follows:  She has agreed to follow the Category 1 plan.  Jennifer [name] has been instructed to eventually work up to a goal of 150 minutes of combined cardio and strengthening exercise per week for weight loss and overall health benefits. We discussed the following Behavioral Modification Strategies today: increasing lean protein intake,  decreasing simple carbohydrates, decrease eating out, work on meal planning and easy cooking plans.   She was informed of the importance of frequent follow-up visits to maximize her success with intensive lifestyle modifications for her multiple health conditions. She was informed we would discuss her lab results at her next visit unless there is a critical issue that needs to be addressed sooner. Clemie agreed to keep her next visit at the agreed upon time to discuss these results.  ALLERGIES: Allergies  Allergen Reactions  . Crestor [Rosuvastatin] Other (See Comments)    Muscle aches  . Neomycin-Bacitracin Zn-Polymyx Swelling    OPTHALMIC EXPOSURE ONLY - - SWELLING EYES   . Neomycin-Bacitracin-Polymyxin [Bacitracin-Neomycin-Polymyxin] Swelling    Reaction to eye drops  . Codeine Rash  . Feldene [Piroxicam] Rash  . Penicillins Rash    Has patient had a PCN reaction causing immediate rash, facial/tongue/throat swelling, SOB or lightheadedness with hypotension:Yes Has patient had a PCN reaction causing severe rash involving mucus membranes or skin necrosis: No Has patient had a PCN reaction that required hospitalization:No Has patient had a PCN reaction occurring within the last 10 years:No If all of the above answers are "NO", then may proceed with Cephalosporin use.   . Piroxicam Rash  . Sulfa Antibiotics Rash  . Sulfonamide Derivatives Rash    MEDICATIONS: Current Outpatient Medications on File Prior to Visit  Medication Sig Dispense Refill  . aspirin EC 81 MG tablet Take 1 tablet (81 mg total) by mouth 2 (two) times daily. 60 tablet 0  . Biotin 1000 MCG tablet Take 1,000 mcg by mouth daily.    Marland Kitchen CALCIUM CARBONATE PO Take 600 mg by mouth daily.     . cetirizine (ZYRTEC) 10 MG tablet Take 10 mg by mouth every evening.     . citalopram (CELEXA) 40 MG tablet TAKE 1 TABLET BY MOUTH EVERY DAY (Patient taking differently: Take 40 mg by mouth daily. ) 90 tablet 1  . hydroxypropyl  methylcellulose / hypromellose (ISOPTO TEARS / GONIOVISC) 2.5 % ophthalmic solution Place 1 drop into both eyes 2 (two) times daily as needed for dry eyes.    Marland Kitchen levETIRAcetam (KEPPRA) 500 MG tablet Take 1 tablet (500 mg total) by mouth 2 (two) times daily. 180 tablet 3  . levothyroxine (SYNTHROID, LEVOTHROID) 50 MCG tablet Take 1 tablet (50 mcg total) by mouth daily. 90 tablet 3  . losartan (COZAAR) 50 MG tablet Take 1 tablet (50 mg total) by mouth daily. 90 tablet 3  . MELATONIN PO Take 10 mg by mouth at bedtime.     . Multiple Vitamins-Minerals (MULTIPLE VITAMINS/WOMENS PO) Take 1 tablet by mouth daily.     Marland Kitchen oxyCODONE-acetaminophen (PERCOCET/ROXICET) 5-325 MG tablet Take 1 tablet by mouth every 4 (four) hours as needed for severe pain. 30 tablet 0  . pravastatin (PRAVACHOL) 80 MG tablet TAKE 1 TABLET BY MOUTH EVERY DAY (Patient taking differently: Take 80 mg by mouth daily. ) 90 tablet 1   No current facility-administered medications on file prior to visit.     PAST MEDICAL HISTORY: Past Medical History:  Diagnosis Date  . Allergy   .  Anxiety   . Constipation   . Depression   . Dysrhythmia    FLB - ? PAC'S COMES AND GOES  . Gallbladder problem   . GERD (gastroesophageal reflux disease)   . High cholesterol   . Hx of adenomatous polyp of colon    2012 - [facility]  . Hyperlipidemia   . Hypertension   . Hypothyroid   . Joint pain   . Osteoarthritis   . PLMD (periodic limb movement disorder) 04/24/2012  . Prediabetes   . Seizures ([place])    08/29/13  . Sleep apnea    wears CPAP  . Vitamin D deficiency     PAST SURGICAL HISTORY: Past Surgical History:  Procedure Laterality Date  . APPENDECTOMY    . CESAREAN SECTION    . CHOLECYSTECTOMY  1978  . COLONOSCOPY    . DILATION AND CURETTAGE OF UTERUS    . JOINT REPLACEMENT  1998   right knee  . OSTEOTOMY PROXIMAL FEMORAL    . 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   Procedure: TOTAL HIP ARTHROPLASTY ANTERIOR APPROACH;  Surgeon:  Frederik Pear, MD;  Location: [facility];  Service: Orthopedics;  Laterality: Right;  . TOTAL KNEE ARTHROPLASTY Left 07/20/2018   Procedure: Left Knee Arthroplasty;  Surgeon: Frederik Pear, MD;  Location: [facility];  Service: Orthopedics;  Laterality: Left;    SOCIAL HISTORY: Social History   Tobacco Use  . Smoking status: Never Smoker  . Smokeless tobacco: Never Used  Substance Use Topics  . Alcohol use: No    Alcohol/week: 0.0 standard drinks  . Drug use: No    FAMILY HISTORY: Family History  Problem Relation Age of Onset  . Cancer Father 6  . Esophageal cancer Father   . Alcoholism Father   . Heart disease Mother 48       Aortic valve disease  . Diabetes Mother   . Hypertension Mother   . Thyroid disease Mother   . Atrial fibrillation Brother   . Sleep apnea Brother   . Sleep apnea Sister   . Sleep apnea Sister   . Stomach cancer Maternal Grandmother   . Colon cancer Neg Hx   . Rectal cancer Neg Hx    ROS: Review of Systems  Constitutional: Positive for malaise/Jennifer.  HENT:       Positive for nose stuffiness. Positive for nasal discharge.  Eyes:       Positive for wearing glasses or contacts.  Respiratory: Positive for shortness of breath (with activity).   Cardiovascular: Negative for chest pain and palpitations.  Gastrointestinal: Positive for constipation and heartburn.  Musculoskeletal: Positive for joint pain and myalgias.  Skin:       Positive for skin dryness.  Neurological: Positive for seizures. Negative for dizziness and headaches.  Endo/Heme/Allergies: Positive for polydipsia.       Positive for polyphagia.  Psychiatric/Behavioral: Positive for depression. The patient has insomnia.        Positive for stress.   PHYSICAL EXAM: Blood pressure 121/74, pulse 80, temperature 98.1 F (36.7 C), temperature source Oral, height 5\' 3"  (1.6 m), weight 208 lb (94.3 kg), SpO2 95 %. Body mass index is 36.85 kg/m. Physical Exam Vitals signs reviewed.   Constitutional:      Appearance: Normal appearance. She is well-developed. She is obese.  HENT:     Head: Normocephalic and atraumatic.     Nose: Nose normal.  Eyes:     General: No scleral icterus. Neck:     Musculoskeletal: Normal range  of motion.  Cardiovascular:     Rate and Rhythm: Normal rate and regular rhythm.  Pulmonary:     Effort: Pulmonary effort is normal. No respiratory distress.  Abdominal:     Palpations: Abdomen is soft.     Tenderness: There is no abdominal tenderness.  Musculoskeletal: Normal range of motion.     Comments: Range of motion normal in all four extremities.  Skin:    General: Skin is warm and dry.  Neurological:     Mental Status: She is alert and oriented to person, place, and time.     Coordination: Coordination normal.  Psychiatric:        Mood and Affect: Mood and affect normal.        Behavior: Behavior normal.   RECENT LABS AND TESTS: BMET    Component Value Date/Time   NA 139 08/25/2018 1210   K 4.3 08/25/2018 1210   CL 97 08/25/2018 1210   CO2 22 08/25/2018 1210   GLUCOSE 74 08/25/2018 1210   GLUCOSE 138 (H) 07/21/2018 0304   BUN 12 08/25/2018 1210   CREATININE 0.74 08/25/2018 1210   CREATININE 0.72 09/02/2015 1207   CALCIUM 9.9 08/25/2018 1210   GFRNONAA 84 08/25/2018 1210   GFRNONAA 89 09/02/2015 1207   GFRAA 97 08/25/2018 1210   GFRAA >89 09/02/2015 1207   Lab Results  Component Value Date   HGBA1C 5.4 08/25/2018   Lab Results  Component Value Date   INSULIN 14.7 08/25/2018   CBC    Component Value Date/Time   WBC 15.6 (H) 07/21/2018 0304   RBC 3.50 (L) 07/21/2018 0304   HGB 11.0 (L) 07/21/2018 0304   HCT 33.5 (L) 07/21/2018 0304   PLT 276 07/21/2018 0304   MCV 95.7 07/21/2018 0304   MCV 87.4 09/02/2015 1225   MCH 31.4 07/21/2018 0304   MCHC 32.8 07/21/2018 0304   RDW 12.7 07/21/2018 0304   LYMPHSABS 1.2 07/16/2018 1121   MONOABS 0.7 07/16/2018 1121   EOSABS 0.1 07/16/2018 1121   BASOSABS 0.1 07/16/2018  1121   Iron/TIBC/Ferritin/ %Sat No results found for: IRON, TIBC, FERRITIN, IRONPCTSAT Lipid Panel     Component Value Date/Time   CHOL 281 (H) 08/25/2018 1210   TRIG 345 (H) 08/25/2018 1210   HDL 40 08/25/2018 1210   CHOLHDL 5 05/04/2018 0805   VLDL 78.4 (H) 05/04/2018 0805   LDLCALC 172 (H) 08/25/2018 1210   LDLDIRECT 96.0 05/04/2018 0805   Hepatic Function Panel     Component Value Date/Time   PROT 6.9 08/25/2018 1210   ALBUMIN 4.5 08/25/2018 1210   AST 25 08/25/2018 1210   ALT 24 08/25/2018 1210   ALKPHOS 124 (H) 08/25/2018 1210   BILITOT 0.5 08/25/2018 1210      Component Value Date/Time   TSH 2.380 08/25/2018 1210   TSH 2.27 05/04/2018 0805   TSH 2.85 09/16/2017 0918   No results found for: Vitamin D, 25-Hydroxy  ECG performed on 07/16/2018 showed normal sinus rhythm with a rate of 66 BPM, normal report.  INDIRECT CALORIMETER done today shows a VO2 of 180 and a REE of 1250.  Her calculated basal metabolic rate is 8182 thus her basal metabolic rate is worse than expected.  OBESITY BEHAVIORAL INTERVENTION VISIT  Today's visit was #1  Starting weight: 208 lbs Starting date: 08/25/2018 Today's weight: 208 lbs  Today's date: 08/25/2018 Total lbs lost to date: 0 At least 15 minutes were spent on discussing the following behavioral intervention visit.  08/25/2018  Height 5\' 3"  (1.6 m)  Weight 208 lb (94.3 kg)  BMI (Calculated) 36.85  BLOOD PRESSURE - SYSTOLIC 022  BLOOD PRESSURE - DIASTOLIC 74  Waist Measurement  49 inches   Body Fat % 47.9 %  Total Body Water (lbs) 82.4 lbs  RMR 1250   ASK: We discussed the diagnosis of obesity with Jeannie Done today and Zykeria agreed to give [place] permission to discuss obesity behavioral modification therapy today.  ASSESS: Keandrea has the diagnosis of obesity and her BMI today is 36.8. Jasmene is in the action stage of change.   ADVISE: Mistee was educated on the multiple health risks of obesity as well as  the benefit of weight loss to improve her health. She was advised of the need for long term treatment and the importance of lifestyle modifications to improve her current health and to decrease her risk of future health problems.  AGREE: Multiple dietary modification options and treatment options were discussed and  Albertine agreed to follow the recommendations documented in the above note.  ARRANGE: Mizani was educated on the importance of frequent visits to treat obesity as outlined per CMS and USPSTF guidelines and agreed to schedule her next follow up appointment today.  I, Michaelene Song, am acting as Location manager for Dennard Nip, MD I have reviewed the above documentation for accuracy and completeness, and I agree with the above. -Dennard Nip, MD

## 2018-08-31 ENCOUNTER — Ambulatory Visit (INDEPENDENT_AMBULATORY_CARE_PROVIDER_SITE_OTHER): Payer: PRIVATE HEALTH INSURANCE | Admitting: Psychology

## 2018-08-31 ENCOUNTER — Other Ambulatory Visit: Payer: Self-pay

## 2018-08-31 DIAGNOSIS — F3289 Other specified depressive episodes: Secondary | ICD-10-CM

## 2018-09-01 DIAGNOSIS — R531 Weakness: Secondary | ICD-10-CM | POA: Diagnosis not present

## 2018-09-01 DIAGNOSIS — M1712 Unilateral primary osteoarthritis, left knee: Secondary | ICD-10-CM | POA: Diagnosis not present

## 2018-09-02 ENCOUNTER — Ambulatory Visit: Payer: PRIVATE HEALTH INSURANCE | Admitting: Psychology

## 2018-09-03 DIAGNOSIS — R531 Weakness: Secondary | ICD-10-CM | POA: Diagnosis not present

## 2018-09-03 DIAGNOSIS — M1712 Unilateral primary osteoarthritis, left knee: Secondary | ICD-10-CM | POA: Diagnosis not present

## 2018-09-04 ENCOUNTER — Other Ambulatory Visit: Payer: Self-pay | Admitting: Family Medicine

## 2018-09-04 DIAGNOSIS — E039 Hypothyroidism, unspecified: Secondary | ICD-10-CM

## 2018-09-07 NOTE — Telephone Encounter (Signed)
Last lab check 08/25/2018 TSH Ref Range & Units 13d ago 34mo ago 62mo ago 58yr ago 71yr ago  TSH 0.450 - 4.500 uIU/mL 2.380  2.27 R  2.85 R  8.96High  R  4.34 R        Please advise on Levothyroxine refill.

## 2018-09-08 ENCOUNTER — Encounter (INDEPENDENT_AMBULATORY_CARE_PROVIDER_SITE_OTHER): Payer: Self-pay | Admitting: Family Medicine

## 2018-09-08 ENCOUNTER — Ambulatory Visit (INDEPENDENT_AMBULATORY_CARE_PROVIDER_SITE_OTHER): Payer: Medicare Other | Admitting: Family Medicine

## 2018-09-08 ENCOUNTER — Other Ambulatory Visit: Payer: Self-pay

## 2018-09-08 VITALS — BP 136/74 | HR 78 | Temp 98.0°F | Ht 63.0 in | Wt 204.0 lb

## 2018-09-08 DIAGNOSIS — E8881 Metabolic syndrome: Secondary | ICD-10-CM | POA: Diagnosis not present

## 2018-09-08 DIAGNOSIS — E782 Mixed hyperlipidemia: Secondary | ICD-10-CM

## 2018-09-08 DIAGNOSIS — R531 Weakness: Secondary | ICD-10-CM | POA: Diagnosis not present

## 2018-09-08 DIAGNOSIS — Z6836 Body mass index (BMI) 36.0-36.9, adult: Secondary | ICD-10-CM | POA: Diagnosis not present

## 2018-09-08 DIAGNOSIS — E559 Vitamin D deficiency, unspecified: Secondary | ICD-10-CM | POA: Diagnosis not present

## 2018-09-08 DIAGNOSIS — M1712 Unilateral primary osteoarthritis, left knee: Secondary | ICD-10-CM | POA: Diagnosis not present

## 2018-09-08 DIAGNOSIS — Z23 Encounter for immunization: Secondary | ICD-10-CM | POA: Diagnosis not present

## 2018-09-08 MED ORDER — VITAMIN D (ERGOCALCIFEROL) 1.25 MG (50000 UNIT) PO CAPS: 50000.0000 [IU] | ORAL_CAPSULE | ORAL | 0 refills | Status: DC

## 2018-09-08 NOTE — Progress Notes (Signed)
Office: [phone]  /  Fax: [phone]   HPI:   Chief Complaint: OBESITY Jennifer [name] is here to discuss her progress with her obesity treatment plan. She is on the Category 1 plan and is following her eating plan approximately 100 % of the time. She states she is exercising 0 minutes 0 times per week. Jennifer [name] continues to do well on her Category 1 plan. Her hunger is controlled and she is happy with the structure.  Her weight is 204 lb (92.5 kg) today and has had a weight loss of 4 pounds over a period of 2 weeks since her last visit. She has lost 4 lbs since starting treatment with [place].  Vitamin D Deficiency Jennifer [name] has a new diagnosis of vitamin D deficiency. She is currently taking Vit D. She notes fatigue and denies nausea, vomiting or muscle weakness.  Hyperlipidemia (Mixed) Jennifer [name] has hyperlipidemia and has been trying to improve her cholesterol levels with intensive lifestyle modification including a low saturated fat diet, exercise and weight loss. Last LDL and triglycerides are elevated. She has ha strong family history of hyperlipidemia. She denies any chest pain, claudication or myalgias.  Insulin Resistance Jennifer [name] has a new diagnosis of insulin resistance based on her elevated fasting insulin level >5. Although Jennifer [name]'s blood glucose readings are still under good control, insulin resistance puts her at greater risk of metabolic syndrome and diabetes. She is not taking metformin currently and working on diet and has [name] well with decreasing simple carbohydrates. She denies hypoglycemia.  ASSESSMENT AND PLAN:  Vitamin D deficiency - Plan: Vitamin D, Ergocalciferol, (DRISDOL) 1.25 MG (50000 UT) CAPS capsule  Mixed hyperlipidemia  Insulin resistance  Class 2 severe obesity with serious comorbidity and body mass index (BMI) of 36.0 to 36.9 in adult, unspecified obesity type (Jennifer [name])  PLAN:  Vitamin D Deficiency Jennifer [name] was informed that low vitamin D levels contributes to  fatigue and are associated with obesity, breast, and colon cancer. Jennifer [name] agrees to start prescription Vit D 50,000 IU every week #4 with no refills. She will follow up for routine testing of vitamin D, at least 2-3 times per year. She was informed of the risk of over-replacement of vitamin D and agrees to not increase her dose unless she discusses this with [place] first. [name] agrees to follow up with our clinic in 2 weeks.  Hyperlipidemia (Mixed) Jennifer [name] was informed of the American Heart Association Guidelines emphasizing intensive lifestyle modifications as the first line treatment for hyperlipidemia. We discussed many lifestyle modifications today in depth, and Jennifer [name] will continue to work on decreasing saturated fats such as fatty red meat, butter and many fried foods. She will also increase vegetables and lean protein in her diet and continue to work on diet, exercise and weight loss efforts. We will recheck labs in 3 months.  Insulin Resistance Jennifer [name] will continue to work on weight loss, diet, exercise, and decreasing simple carbohydrates in her diet to help decrease the risk of diabetes. We dicussed metformin including benefits and risks. She was informed that eating too many simple carbohydrates or too many calories at one sitting increases the likelihood of GI side effects. Jennifer [name] declined metformin for now and prescription was not written today. Jennifer [name] agrees to follow up with our clinic in 2 weeks as directed to monitor her progress.  Obesity Jennifer [name] is currently in the action stage of change. As such, her goal is to continue with weight loss efforts She has agreed to follow the Category 1 plan Jennifer [name] has  been instructed to work up to a goal of 150 minutes of combined cardio and strengthening exercise per week for weight loss and overall health benefits. We discussed the following Behavioral Modification Strategies today: increasing lean protein intake and decreasing simple  carbohydrates    Jennifer [name] has agreed to follow up with our clinic in 2 weeks. She was informed of the importance of frequent follow up visits to maximize her success with intensive lifestyle modifications for her multiple health conditions.  ALLERGIES: Allergies  Allergen Reactions  . Crestor [Rosuvastatin] Other (See Comments)    Muscle aches  . Neomycin-Bacitracin Zn-Polymyx Swelling    OPTHALMIC EXPOSURE ONLY - - SWELLING EYES   . Neomycin-Bacitracin-Polymyxin [Bacitracin-Neomycin-Polymyxin] Swelling    Reaction to eye drops  . Codeine Rash  . Feldene [Piroxicam] Rash  . Penicillins Rash    Has patient had a PCN reaction causing immediate rash, facial/tongue/throat swelling, SOB or lightheadedness with hypotension:Yes Has patient had a PCN reaction causing severe rash involving mucus membranes or skin necrosis: No Has patient had a PCN reaction that required hospitalization:No Has patient had a PCN reaction occurring within the last 10 years:No If all of the above answers are "NO", then may proceed with Cephalosporin use.   . Piroxicam Rash  . Sulfa Antibiotics Rash  . Sulfonamide Derivatives Rash    MEDICATIONS: Current Outpatient Medications on File Prior to Visit  Medication Sig Dispense Refill  . Biotin 1000 MCG tablet Take 1,000 mcg by mouth daily.    Marland Kitchen CALCIUM CARBONATE PO Take 600 mg by mouth daily.     . cetirizine (ZYRTEC) 10 MG tablet Take 10 mg by mouth every evening.     . citalopram (CELEXA) 40 MG tablet TAKE 1 TABLET BY MOUTH EVERY DAY (Patient taking differently: Take 40 mg by mouth daily. ) 90 tablet 1  . hydroxypropyl methylcellulose / hypromellose (ISOPTO TEARS / GONIOVISC) 2.5 % ophthalmic solution Place 1 drop into both eyes 2 (two) times daily as needed for dry eyes.    Marland Kitchen levETIRAcetam (KEPPRA) 500 MG tablet Take 1 tablet (500 mg total) by mouth 2 (two) times daily. 180 tablet 3  . levothyroxine (SYNTHROID) 50 MCG tablet TAKE 1 TABLET BY MOUTH EVERY DAY  90 tablet 3  . losartan (COZAAR) 50 MG tablet Take 1 tablet (50 mg total) by mouth daily. 90 tablet 3  . MELATONIN PO Take 10 mg by mouth at bedtime.     . Multiple Vitamins-Minerals (MULTIPLE VITAMINS/WOMENS PO) Take 1 tablet by mouth daily.     . pravastatin (PRAVACHOL) 80 MG tablet TAKE 1 TABLET BY MOUTH EVERY DAY (Patient taking differently: Take 80 mg by mouth daily. ) 90 tablet 1   No current facility-administered medications on file prior to visit.     PAST MEDICAL HISTORY: Past Medical History:  Diagnosis Date  . Allergy   . Anxiety   . Constipation   . Depression   . Dysrhythmia    FLB - ? PAC'S COMES AND GOES  . Gallbladder problem   . GERD (gastroesophageal reflux disease)   . High cholesterol   . Hx of adenomatous polyp of colon    2012 - [facility]  . Hyperlipidemia   . Hypertension   . Hypothyroid   . Joint pain   . Osteoarthritis   . PLMD (periodic limb movement disorder) 04/24/2012  . Prediabetes   . Seizures ([place])    08/29/13  . Sleep apnea    wears CPAP  . Vitamin D  deficiency     PAST SURGICAL HISTORY: Past Surgical History:  Procedure Laterality Date  . APPENDECTOMY    . CESAREAN SECTION    . CHOLECYSTECTOMY  1978  . COLONOSCOPY    . DILATION AND CURETTAGE OF UTERUS    . JOINT REPLACEMENT  1998   right knee  . OSTEOTOMY PROXIMAL FEMORAL    . 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   Procedure: TOTAL HIP ARTHROPLASTY ANTERIOR APPROACH;  Surgeon: Frederik Pear, MD;  Location: [facility];  Service: Orthopedics;  Laterality: Right;  . TOTAL KNEE ARTHROPLASTY Left 07/20/2018   Procedure: Left Knee Arthroplasty;  Surgeon: Frederik Pear, MD;  Location: [facility];  Service: Orthopedics;  Laterality: Left;    SOCIAL HISTORY: Social History   Tobacco Use  . Smoking status: Never Smoker  . Smokeless tobacco: Never Used  Substance Use Topics  . Alcohol use: No    Alcohol/week: 0.0 standard drinks  . Drug use: No    FAMILY HISTORY: Family History  Problem  Relation Age of Onset  . Cancer Father 71  . Esophageal cancer Father   . Alcoholism Father   . Heart disease Mother 4       Aortic valve disease  . Diabetes Mother   . Hypertension Mother   . Thyroid disease Mother   . Atrial fibrillation Brother   . Sleep apnea Brother   . Sleep apnea Sister   . Sleep apnea Sister   . Stomach cancer Maternal Grandmother   . Colon cancer Neg Hx   . Rectal cancer Neg Hx     ROS: Review of Systems  Constitutional: Positive for malaise/fatigue and weight loss.  Cardiovascular: Negative for chest pain and claudication.  Gastrointestinal: Negative for nausea and vomiting.  Musculoskeletal: Negative for myalgias.       Negative muscle weakness  Endo/Heme/Allergies:       Negative hypoglycemia    PHYSICAL EXAM: Blood pressure 136/74, pulse 78, temperature 98 F (36.7 C), temperature source Oral, height 5\' 3"  (1.6 m), weight 204 lb (92.5 kg), SpO2 93 %. Body mass index is 36.14 kg/m. Physical Exam Vitals signs reviewed.  Constitutional:      Appearance: Normal appearance. She is obese.  Cardiovascular:     Rate and Rhythm: Normal rate.     Pulses: Normal pulses.  Pulmonary:     Effort: Pulmonary effort is normal.     Breath sounds: Normal breath sounds.  Musculoskeletal: Normal range of motion.  Skin:    General: Skin is warm and dry.  Neurological:     Mental Status: She is alert and oriented to person, place, and time.  Psychiatric:        Mood and Affect: Mood normal.        Behavior: Behavior normal.     RECENT LABS AND TESTS: BMET    Component Value Date/Time   NA 139 08/25/2018 1210   K 4.3 08/25/2018 1210   CL 97 08/25/2018 1210   CO2 22 08/25/2018 1210   GLUCOSE 74 08/25/2018 1210   GLUCOSE 138 (H) 07/21/2018 0304   BUN 12 08/25/2018 1210   CREATININE 0.74 08/25/2018 1210   CREATININE 0.72 09/02/2015 1207   CALCIUM 9.9 08/25/2018 1210   GFRNONAA 84 08/25/2018 1210   GFRNONAA 89 09/02/2015 1207   GFRAA 97  08/25/2018 1210   GFRAA >89 09/02/2015 1207   Lab Results  Component Value Date   HGBA1C 5.4 08/25/2018   HGBA1C 5.2 09/16/2017   HGBA1C 5.4 03/20/2017  HGBA1C 5.1 09/25/2016   HGBA1C 5.9 03/13/2016   Lab Results  Component Value Date   INSULIN 14.7 08/25/2018   CBC    Component Value Date/Time   WBC 15.6 (H) 07/21/2018 0304   RBC 3.50 (L) 07/21/2018 0304   HGB 11.0 (L) 07/21/2018 0304   HCT 33.5 (L) 07/21/2018 0304   PLT 276 07/21/2018 0304   MCV 95.7 07/21/2018 0304   MCV 87.4 09/02/2015 1225   MCH 31.4 07/21/2018 0304   MCHC 32.8 07/21/2018 0304   RDW 12.7 07/21/2018 0304   LYMPHSABS 1.2 07/16/2018 1121   MONOABS 0.7 07/16/2018 1121   EOSABS 0.1 07/16/2018 1121   BASOSABS 0.1 07/16/2018 1121   Iron/TIBC/Ferritin/ %Sat No results found for: IRON, TIBC, FERRITIN, IRONPCTSAT Lipid Panel     Component Value Date/Time   CHOL 281 (H) 08/25/2018 1210   TRIG 345 (H) 08/25/2018 1210   HDL 40 08/25/2018 1210   CHOLHDL 5 05/04/2018 0805   VLDL 78.4 (H) 05/04/2018 0805   LDLCALC 172 (H) 08/25/2018 1210   LDLDIRECT 96.0 05/04/2018 0805   Hepatic Function Panel     Component Value Date/Time   PROT 6.9 08/25/2018 1210   ALBUMIN 4.5 08/25/2018 1210   AST 25 08/25/2018 1210   ALT 24 08/25/2018 1210   ALKPHOS 124 (H) 08/25/2018 1210   BILITOT 0.5 08/25/2018 1210      Component Value Date/Time   TSH 2.380 08/25/2018 1210   TSH 2.27 05/04/2018 0805   TSH 2.85 09/16/2017 0918      OBESITY BEHAVIORAL INTERVENTION VISIT  Today's visit was # 2   Starting weight: 208 lbs Starting date: 08/25/2018 Today's weight : 204 lbs Today's date: 09/08/2018 Total lbs lost to date: 4 At least 15 minutes were spent on discussing the following behavioral intervention visit.   ASK: We discussed the diagnosis of obesity with Jennifer [name] today and Jennifer [name] agreed to give [place] permission to discuss obesity behavioral modification therapy today.  ASSESS: Jennifer [name] has the  diagnosis of obesity and her BMI today is 36.15 Jennifer [name] is in the action stage of change   ADVISE: Jennifer [name] was educated on the multiple health risks of obesity as well as the benefit of weight loss to improve her health. She was advised of the need for long term treatment and the importance of lifestyle modifications to improve her current health and to decrease her risk of future health problems.  AGREE: Multiple dietary modification options and treatment options were discussed and  Jennifer [name] agreed to follow the recommendations documented in the above note.  ARRANGE: Jennifer [name] was educated on the importance of frequent visits to treat obesity as outlined per CMS and USPSTF guidelines and agreed to schedule her next follow up appointment today.  I, Trixie Dredge, am acting as transcriptionist for Dennard Nip, MD I have reviewed the above documentation for accuracy and completeness, and I agree with the above. -Dennard Nip, MD

## 2018-09-10 ENCOUNTER — Encounter (INDEPENDENT_AMBULATORY_CARE_PROVIDER_SITE_OTHER): Payer: Self-pay | Admitting: Family Medicine

## 2018-09-10 DIAGNOSIS — M1712 Unilateral primary osteoarthritis, left knee: Secondary | ICD-10-CM | POA: Diagnosis not present

## 2018-09-10 DIAGNOSIS — R531 Weakness: Secondary | ICD-10-CM | POA: Diagnosis not present

## 2018-09-10 NOTE — Telephone Encounter (Signed)
Please advise 

## 2018-09-15 DIAGNOSIS — M1712 Unilateral primary osteoarthritis, left knee: Secondary | ICD-10-CM | POA: Diagnosis not present

## 2018-09-15 DIAGNOSIS — R531 Weakness: Secondary | ICD-10-CM | POA: Diagnosis not present

## 2018-09-16 ENCOUNTER — Ambulatory Visit (INDEPENDENT_AMBULATORY_CARE_PROVIDER_SITE_OTHER): Payer: Medicare Other | Admitting: Psychology

## 2018-09-16 DIAGNOSIS — F320 Major depressive disorder, single episode, mild: Secondary | ICD-10-CM | POA: Diagnosis not present

## 2018-09-17 ENCOUNTER — Encounter (INDEPENDENT_AMBULATORY_CARE_PROVIDER_SITE_OTHER): Payer: Self-pay | Admitting: Family Medicine

## 2018-09-17 ENCOUNTER — Ambulatory Visit (INDEPENDENT_AMBULATORY_CARE_PROVIDER_SITE_OTHER): Payer: Medicare Other | Admitting: Family Medicine

## 2018-09-17 ENCOUNTER — Other Ambulatory Visit: Payer: Self-pay

## 2018-09-17 VITALS — BP 111/63 | HR 80 | Temp 98.2°F | Ht 63.0 in | Wt 201.0 lb

## 2018-09-17 DIAGNOSIS — Z6835 Body mass index (BMI) 35.0-35.9, adult: Secondary | ICD-10-CM

## 2018-09-17 DIAGNOSIS — R531 Weakness: Secondary | ICD-10-CM | POA: Diagnosis not present

## 2018-09-17 DIAGNOSIS — M1712 Unilateral primary osteoarthritis, left knee: Secondary | ICD-10-CM | POA: Diagnosis not present

## 2018-09-17 DIAGNOSIS — E8881 Metabolic syndrome: Secondary | ICD-10-CM

## 2018-09-21 NOTE — Progress Notes (Signed)
Office: [phone]  /  Fax: [phone]   HPI:   Chief Complaint: OBESITY Jennifer [name] is here to discuss her progress with her obesity treatment plan. She is on the Category 1 plan and is following her eating plan approximately 99 % of the time. She states she is exercising 0 minutes 0 times per week. [name] continues to do well with her Category 1 plan. Hunger is controlled and she is doing well with meal planning and prepping. She has decreased eating out. Her weight is 201 lb (91.2 kg) today and has had a weight loss of 3 pounds over a period of 2 weeks since her last visit. She has lost 3 lbs since starting treatment with [place].  Insulin Resistance [name] has a diagnosis of insulin resistance based on her elevated fasting insulin level >5. Although [name]'s blood glucose readings are still under good control, insulin resistance puts her at greater risk of metabolic syndrome and diabetes. [name] has some questions about insulin resistance and glycemic index foods. [name] is not taking medications currently and she continues to work on diet and exercise to decrease risk of diabetes.  ASSESSMENT AND PLAN:  Insulin resistance  Class 2 severe obesity with serious comorbidity and body mass index (BMI) of 35.0 to 35.9 in adult, unspecified obesity type ([place])  PLAN:  Insulin Resistance [name] will continue to work on weight loss, exercise, and decreasing simple carbohydrates in her diet to help decrease the risk of diabetes. She was informed that eating too many simple carbohydrates or too many calories at one sitting increases the likelihood of GI side effects. [name] was educated further on glycemic index, artificial sweeteners and insulin resistance. [name] agreed to follow up with [place] as directed to monitor her progress.  I spent > than 50% of the 25 minute visit on counseling as documented in the note.  Obesity [name] is currently in the action stage of change. As such, her goal  is to continue with weight loss efforts She has agreed to follow the Category 1 plan [name] has been instructed to work up to a goal of 150 minutes of combined cardio and strengthening exercise per week for weight loss and overall health benefits. We discussed the following Behavioral Modification Strategies today: increasing lean protein intake and decreasing simple carbohydrates   [name] has agreed to follow up with our clinic in 2 weeks. She was informed of the importance of frequent follow up visits to maximize her success with intensive lifestyle modifications for her multiple health conditions.  ALLERGIES: Allergies  Allergen Reactions  . Crestor [Rosuvastatin] Other (See Comments)    Muscle aches  . Neomycin-Bacitracin Zn-Polymyx Swelling    OPTHALMIC EXPOSURE ONLY - - SWELLING EYES   . Neomycin-Bacitracin-Polymyxin [Bacitracin-Neomycin-Polymyxin] Swelling    Reaction to eye drops  . Codeine Rash  . Feldene [Piroxicam] Rash  . Penicillins Rash    Has patient had a PCN reaction causing immediate rash, facial/tongue/throat swelling, SOB or lightheadedness with hypotension:Yes Has patient had a PCN reaction causing severe rash involving mucus membranes or skin necrosis: No Has patient had a PCN reaction that required hospitalization:No Has patient had a PCN reaction occurring within the last 10 years:No If all of the above answers are "NO", then may proceed with Cephalosporin use.   . Piroxicam Rash  . Sulfa Antibiotics Rash  . Sulfonamide Derivatives Rash    MEDICATIONS: Current Outpatient Medications on File Prior to Visit  Medication Sig Dispense Refill  . Biotin 1000 MCG tablet Take  1,000 mcg by mouth daily.    Marland Kitchen CALCIUM CARBONATE PO Take 600 mg by mouth daily.     . cetirizine (ZYRTEC) 10 MG tablet Take 10 mg by mouth every evening.     . citalopram (CELEXA) 40 MG tablet TAKE 1 TABLET BY MOUTH EVERY DAY (Patient taking differently: Take 40 mg by mouth daily. ) 90  tablet 1  . hydroxypropyl methylcellulose / hypromellose (ISOPTO TEARS / GONIOVISC) 2.5 % ophthalmic solution Place 1 drop into both eyes 2 (two) times daily as needed for dry eyes.    Marland Kitchen levETIRAcetam (KEPPRA) 500 MG tablet Take 1 tablet (500 mg total) by mouth 2 (two) times daily. 180 tablet 3  . levothyroxine (SYNTHROID) 50 MCG tablet TAKE 1 TABLET BY MOUTH EVERY DAY 90 tablet 3  . losartan (COZAAR) 50 MG tablet Take 1 tablet (50 mg total) by mouth daily. 90 tablet 3  . MELATONIN PO Take 10 mg by mouth at bedtime.     . Multiple Vitamins-Minerals (MULTIPLE VITAMINS/WOMENS PO) Take 1 tablet by mouth daily.     . pravastatin (PRAVACHOL) 80 MG tablet TAKE 1 TABLET BY MOUTH EVERY DAY (Patient taking differently: Take 80 mg by mouth daily. ) 90 tablet 1  . Vitamin D, Ergocalciferol, (DRISDOL) 1.25 MG (50000 UT) CAPS capsule Take 1 capsule (50,000 Units total) by mouth every 7 (seven) days. 4 capsule 0   No current facility-administered medications on file prior to visit.     PAST MEDICAL HISTORY: Past Medical History:  Diagnosis Date  . Allergy   . Anxiety   . Constipation   . Depression   . Dysrhythmia    FLB - ? PAC'S COMES AND GOES  . Gallbladder problem   . GERD (gastroesophageal reflux disease)   . High cholesterol   . Hx of adenomatous polyp of colon    2012 - [facility]  . Hyperlipidemia   . Hypertension   . Hypothyroid   . Joint pain   . Osteoarthritis   . PLMD (periodic limb movement disorder) 04/24/2012  . Prediabetes   . Seizures ([place])    08/29/13  . Sleep apnea    wears CPAP  . Vitamin D deficiency     PAST SURGICAL HISTORY: Past Surgical History:  Procedure Laterality Date  . APPENDECTOMY    . CESAREAN SECTION    . CHOLECYSTECTOMY  1978  . COLONOSCOPY    . DILATION AND CURETTAGE OF UTERUS    . JOINT REPLACEMENT  1998   right knee  . OSTEOTOMY PROXIMAL FEMORAL    . 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   Procedure: TOTAL HIP ARTHROPLASTY ANTERIOR APPROACH;   Surgeon: Frederik Pear, MD;  Location: [facility];  Service: Orthopedics;  Laterality: Right;  . TOTAL KNEE ARTHROPLASTY Left 07/20/2018   Procedure: Left Knee Arthroplasty;  Surgeon: Frederik Pear, MD;  Location: [facility];  Service: Orthopedics;  Laterality: Left;    SOCIAL HISTORY: Social History   Tobacco Use  . Smoking status: Never Smoker  . Smokeless tobacco: Never Used  Substance Use Topics  . Alcohol use: No    Alcohol/week: 0.0 standard drinks  . Drug use: No    FAMILY HISTORY: Family History  Problem Relation Age of Onset  . Cancer Father 59  . Esophageal cancer Father   . Alcoholism Father   . Heart disease Mother 4       Aortic valve disease  . Diabetes Mother   . Hypertension Mother   . Thyroid disease  Mother   . Atrial fibrillation Brother   . Sleep apnea Brother   . Sleep apnea Sister   . Sleep apnea Sister   . Stomach cancer Maternal Grandmother   . Colon cancer Neg Hx   . Rectal cancer Neg Hx     ROS: Review of Systems  Constitutional: Positive for weight loss.    PHYSICAL EXAM: Blood pressure 111/63, pulse 80, temperature 98.2 F (36.8 C), temperature source Oral, height 5\' 3"  (1.6 m), weight 201 lb (91.2 kg), SpO2 95 %. Body mass index is 35.61 kg/m. Physical Exam Vitals signs reviewed.  Constitutional:      Appearance: Normal appearance. She is well-developed. She is obese.  Cardiovascular:     Rate and Rhythm: Normal rate.  Pulmonary:     Effort: Pulmonary effort is normal.  Musculoskeletal: Normal range of motion.  Skin:    General: Skin is warm and dry.  Neurological:     Mental Status: She is alert and oriented to person, place, and time.  Psychiatric:        Mood and Affect: Mood normal.        Behavior: Behavior normal.     RECENT LABS AND TESTS: BMET    Component Value Date/Time   NA 139 08/25/2018 1210   K 4.3 08/25/2018 1210   CL 97 08/25/2018 1210   CO2 22 08/25/2018 1210   GLUCOSE 74 08/25/2018 1210   GLUCOSE 138 (H)  07/21/2018 0304   BUN 12 08/25/2018 1210   CREATININE 0.74 08/25/2018 1210   CREATININE 0.72 09/02/2015 1207   CALCIUM 9.9 08/25/2018 1210   GFRNONAA 84 08/25/2018 1210   GFRNONAA 89 09/02/2015 1207   GFRAA 97 08/25/2018 1210   GFRAA >89 09/02/2015 1207   Lab Results  Component Value Date   HGBA1C 5.4 08/25/2018   HGBA1C 5.2 09/16/2017   HGBA1C 5.4 03/20/2017   HGBA1C 5.1 09/25/2016   HGBA1C 5.9 03/13/2016   Lab Results  Component Value Date   INSULIN 14.7 08/25/2018   CBC    Component Value Date/Time   WBC 15.6 (H) 07/21/2018 0304   RBC 3.50 (L) 07/21/2018 0304   HGB 11.0 (L) 07/21/2018 0304   HCT 33.5 (L) 07/21/2018 0304   PLT 276 07/21/2018 0304   MCV 95.7 07/21/2018 0304   MCV 87.4 09/02/2015 1225   MCH 31.4 07/21/2018 0304   MCHC 32.8 07/21/2018 0304   RDW 12.7 07/21/2018 0304   LYMPHSABS 1.2 07/16/2018 1121   MONOABS 0.7 07/16/2018 1121   EOSABS 0.1 07/16/2018 1121   BASOSABS 0.1 07/16/2018 1121   Iron/TIBC/Ferritin/ %Sat No results found for: IRON, TIBC, FERRITIN, IRONPCTSAT Lipid Panel     Component Value Date/Time   CHOL 281 (H) 08/25/2018 1210   TRIG 345 (H) 08/25/2018 1210   HDL 40 08/25/2018 1210   CHOLHDL 5 05/04/2018 0805   VLDL 78.4 (H) 05/04/2018 0805   LDLCALC 172 (H) 08/25/2018 1210   LDLDIRECT 96.0 05/04/2018 0805   Hepatic Function Panel     Component Value Date/Time   PROT 6.9 08/25/2018 1210   ALBUMIN 4.5 08/25/2018 1210   AST 25 08/25/2018 1210   ALT 24 08/25/2018 1210   ALKPHOS 124 (H) 08/25/2018 1210   BILITOT 0.5 08/25/2018 1210      Component Value Date/Time   TSH 2.380 08/25/2018 1210   TSH 2.27 05/04/2018 0805   TSH 2.85 09/16/2017 0918     Ref. Range 08/25/2018 12:10  Vitamin D, 25-Hydroxy Latest Ref Range:  30.0 - 100.0 ng/mL 26.4 (L)    OBESITY BEHAVIORAL INTERVENTION VISIT  Today's visit was # 3   Starting weight: 208 lbs Starting date: 08/25/2018 Today's weight : 201 lbs Today's date: 09/17/2018 Total lbs  lost to date: 7    09/17/2018  Height 5\' 3"  (1.6 m)  Weight 201 lb (91.2 kg)  BMI (Calculated) 35.61  BLOOD PRESSURE - SYSTOLIC 99991111  BLOOD PRESSURE - DIASTOLIC 63   Body Fat % [id number] %  Total Body Water (lbs) 81.4 lbs    ASK: We discussed the diagnosis of obesity with Jeannie Done today and Tiphanie agreed to give [place] permission to discuss obesity behavioral modification therapy today.  ASSESS: Deondra has the diagnosis of obesity and her BMI today is 35.61 Zohar is in the action stage of change   ADVISE: Lenell was educated on the multiple health risks of obesity as well as the benefit of weight loss to improve her health. She was advised of the need for long term treatment and the importance of lifestyle modifications to improve her current health and to decrease her risk of future health problems.  AGREE: Multiple dietary modification options and treatment options were discussed and  Kalysa agreed to follow the recommendations documented in the above note.  ARRANGE: Vonya was educated on the importance of frequent visits to treat obesity as outlined per CMS and USPSTF guidelines and agreed to schedule her next follow up appointment today.  I, Doreene Nest, am acting as transcriptionist for Dennard Nip, MD  I have reviewed the above documentation for accuracy and completeness, and I agree with the above. -Dennard Nip, MD

## 2018-09-22 ENCOUNTER — Other Ambulatory Visit: Payer: Self-pay

## 2018-09-22 ENCOUNTER — Ambulatory Visit (INDEPENDENT_AMBULATORY_CARE_PROVIDER_SITE_OTHER): Payer: Medicare Other | Admitting: Psychology

## 2018-09-22 DIAGNOSIS — F3289 Other specified depressive episodes: Secondary | ICD-10-CM

## 2018-09-22 NOTE — Progress Notes (Signed)
Office: [phone]  /  Fax: [phone]    Date: September 22, 2018   Appointment Start Time: 10:04am Duration: 28 minutes Provider: Glennie Isle, Psy.D. Type of Session: Individual Therapy  Location of Patient: Home Location of Provider: [facility] Type of Contact: Telepsychological Visit via [employer] WebEx   Session Content: Jennifer [name] is a 68 y.o. female presenting via [place] for a follow-up appointment to address the previously established treatment goal of decreasing emotional eating. Of note, this provider called Jennifer [name] at 10:02am as she did not present for the [facility] appointment. She indicated she could not locate the e-mail for today. The e-mail with the secure link was re-sent. As such, today's appointment was initiated 4 minutes late. Today's appointment was a telepsychological visit, as this provider's clinic is seeing a limited number of patients for in-person visits due to COVID-19. Therapeutic services will resume to in-person appointments once deemed appropriate. Jennifer [name] expressed understanding regarding the rationale for telepsychological services, and provided verbal consent for today's appointment. Prior to proceeding with today's appointment, Jennifer [name]'s physical location at the time of this appointment was obtained. Jennifer [name] reported she was at home and provided the address. In the event of technical difficulties, Jennifer [name] shared a phone number she could be reached at. Jennifer [name] and this provider participated in today's telepsychological service. Also, Jennifer [name] denied anyone else being present in the room or on the WebEx appointment.  This provider conducted a brief check-in and verbally administered the PHQ-9 and GAD-7. Jennifer [name] stated she informed her other therapist, Jennifer [name], that she is meeting with this provider. Jennifer [name] reportedly recommended Jennifer [name] continue with this provider to address eating concerns and then re-initiate with Jennifer [name]. In  addition, Jennifer [name] stated she reviewed the emotional and physical hunger handout shared by this provider, and described it as "very helpful." She further noted she has not deviated from the meal plan; therefore, she denied episodes of emotional eating since the last appointment with this provider. This was positively reinforced. Psychoeducation regarding triggers for emotional eating was provided to help further increase awareness of eating habits. Jennifer [name] was provided a handout, and encouraged to utilize the handout between now and the next appointment to increase awareness of triggers and frequency. Jennifer [name] agreed. This provider also discussed behavioral strategies for specific triggers, such as placing the utensil down when conversing to avoid mindless eating. Jennifer [name] provided verbal consent during today's appointment for this provider to send the handout about trigges via e-mail. [name] was receptive to today's session as evidenced by openness to sharing, responsiveness to feedback, and willingness to explore triggers for emotional eating.   Mental Status Examination:  Appearance: neat Behavior: cooperative Mood: euthymic Affect: mood congruent Speech: normal in rate, volume, and tone Eye Contact: appropriate Psychomotor Activity: appropriate Thought Process: linear, logical, and goal directed  Content/Perceptual Disturbances: no hallucinations, delusions, bizarre thinking or behavior reported or observed and no evidence of suicidal and homicidal ideation, plan, and intent Orientation: time, person, place and purpose of appointment Cognition/Sensorium: memory, attention, language, and fund of knowledge intact  Insight: good Judgment: good  Structured Assessment Results: The Patient Health Questionnaire-9 (PHQ-9) is a self-report measure that assesses symptoms and severity of depression over the course of the last two weeks. Jennifer [name] obtained a score of 0. Little interest or pleasure in doing  things 0  Feeling down, depressed, or hopeless 0  Trouble falling or staying asleep, or sleeping too much 0  Feeling tired or having little energy 0  Poor appetite  or overeating 0  Feeling bad about yourself --- or that you are a failure or have let yourself or your family down 0  Trouble concentrating on things, such as reading the newspaper or watching television 0  Moving or speaking so slowly that other people could have noticed? Or the opposite --- being so fidgety or restless that you have been moving around a lot more than usual 0  Thoughts that you would be better off dead or hurting yourself in some way 0  PHQ-9 Score 0    The Generalized Anxiety Disorder-7 (GAD-7) is a brief self-report measure that assesses symptoms of anxiety over the course of the last two weeks. Jennifer [name] obtained a score of 0. Feeling nervous, anxious, on edge 0  Not being able to stop or control worrying 0  Worrying too much about different things 0  Trouble relaxing 0  Being so restless that it's hard to sit still 0  Becoming easily annoyed or irritable 0  Feeling afraid as if something awful might happen 0  GAD-7 Score 0   Interventions:  Conducted a brief chart review Verbal administration of PHQ-9 and GAD-7 for symptom monitoring Provided empathic reflections and validation Reviewed content from the previous session Psychoeducation provided regarding triggers for emotional eating Provided positive reinforcement Focused on rapport building Employed supportive psychotherapy interventions to facilitate reduced distress, and to improve coping skills with identified stressors  DSM-5 Diagnosis: 311 (F32.8) Other Specified Depressive Disorder, Emotional Eating Behaviors  Treatment Goal & Progress: During the initial appointment with this provider, the following treatment goal was established: decrease emotional eating. Jennifer [name] has demonstrated progress in her goal as evidenced by increased awareness of  hunger patterns. Jennifer [name] also reported a reduction in emotional eating.  Plan: Glennisha continues to appear able and willing to participate as evidenced by engagement in reciprocal conversation, and asking questions for clarification as appropriate. The next appointment will be scheduled in two weeks, which will be via [employer]. The next session will focus on the introduction of mindfulness.

## 2018-09-23 DIAGNOSIS — M1712 Unilateral primary osteoarthritis, left knee: Secondary | ICD-10-CM | POA: Diagnosis not present

## 2018-09-23 DIAGNOSIS — R531 Weakness: Secondary | ICD-10-CM | POA: Diagnosis not present

## 2018-09-23 NOTE — Progress Notes (Signed)
Office: [phone]  /  Fax: [phone]    Date: October 07, 2018   Appointment Start Time: 11:32am Duration: 25 minutes Provider: Glennie Isle, Psy.D. Type of Session: Individual Therapy  Location of Patient: Home  Location of Provider: Provider's Home Type of Contact: Telepsychological Visit via [employer] WebEx   Session Content: Jennifer [name] is a 68 y.o. female presenting via [place] for a follow-up appointment to address the previously established treatment goal of decreasing emotional eating. Of note, this provider called Jennifer [name] at 11:32am as she did not present for the [facility] appointment. She indicated she was just signing on. As such, today's appointment was initiated 2 minutes late. Today's appointment was a telepsychological visit, as this provider's clinic is seeing a limited number of patients for in-person visits due to COVID-19. Therapeutic services will resume to in-person appointments once deemed appropriate. Jennifer [name] expressed understanding regarding the rationale for telepsychological services, and provided verbal consent for today's appointment. Prior to proceeding with today's appointment, Jennifer [name]'s physical location at the time of this appointment was obtained. Jennifer [name] reported she was at home and provided the address. In the event of technical difficulties, Jennifer [name] shared a phone number she could be reached at. Jennifer [name] and this provider participated in today's telepsychological service. Also, Jennifer [name] denied anyone else being present in the room or on the WebEx appointment.  This provider conducted a brief check-in. Jennifer [name] shared about going out to eat and described making better choices. She noted she will be taking a road trip to [place] with her sister next week. She expressed concern about her ability to eat congruent to the meal plan while on her trip. In addition, she reviewed triggers for emotional eating. Jennifer [name] identified the following triggers: boredom, fatigue, and  create pleasure. Notably, Jennifer [name] indicated a reduction in emotional eating secondary to increased awareness. Psychoeducation regarding mindfulness was provided to assist with coping. A handout was provided to Jennifer [name] with further information regarding mindfulness, including exercises. This provider also explained the benefit of mindfulness as it relates to emotional eating. Jennifer [name] was encouraged to engage in the provided exercises between now and the next appointment with this provider. Jennifer [name] agreed. She was led through an exercise focusing on her senses. Jennifer [name] provided verbal consent during today's appointment for this provider to send the handout about mindfulness via e-mail. Jennifer [name] was receptive to today's session as evidenced by openness to sharing, responsiveness to feedback, and willingness to engage in mindfulness exercises.  Mental Status Examination:  Appearance: neat Behavior: cooperative Mood: euthymic Affect: mood congruent Speech: normal in rate, volume, and tone Eye Contact: appropriate Psychomotor Activity: appropriate Thought Process: linear, logical, and goal directed  Content/Perceptual Disturbances: no hallucinations, delusions, bizarre thinking or behavior reported or observed and no evidence of suicidal and homicidal ideation, plan, and intent Orientation: time, person, place and purpose of appointment Cognition/Sensorium: memory, attention, language, and fund of knowledge intact  Insight: good Judgment: good  Interventions:  Conducted a brief chart review Provided empathic reflections and validation Reviewed content from the previous session Psychoeducation provided regarding mindfulness Engaged patient in a mindfulness exercise Employed supportive psychotherapy interventions to facilitate reduced distress, and to improve coping skills with identified stressors Employed acceptance and commitment interventions to emphasize mindfulness and acceptance without  struggle  DSM-5 Diagnosis: 311 (F32.8) Other Specified Depressive Disorder, Emotional Eating Behaviors  Treatment Goal & Progress: During the initial appointment with this provider, the following treatment goal was established: decrease emotional eating. Jennifer [name] has demonstrated progress in her goal as evidenced by increased  awareness of hunger patterns and triggers for emotional eating. Jennifer [name] also reported a reduction in emotional eating and demonstrates willingness to engage in mindfulness exercises.  Plan: Jennifer [name] continues to appear able and willing to participate as evidenced by engagement in reciprocal conversation, and asking questions for clarification as appropriate. Due to Jennifer [name]'s upcoming vacation, the next appointment will be scheduled in three weeks, which will be via [employer]. The next session will focus further on mindfulness.

## 2018-09-25 DIAGNOSIS — M1712 Unilateral primary osteoarthritis, left knee: Secondary | ICD-10-CM | POA: Diagnosis not present

## 2018-09-25 DIAGNOSIS — R531 Weakness: Secondary | ICD-10-CM | POA: Diagnosis not present

## 2018-09-26 ENCOUNTER — Other Ambulatory Visit: Payer: Self-pay | Admitting: Family Medicine

## 2018-09-26 DIAGNOSIS — I10 Essential (primary) hypertension: Secondary | ICD-10-CM

## 2018-09-26 DIAGNOSIS — E119 Type 2 diabetes mellitus without complications: Secondary | ICD-10-CM

## 2018-09-29 DIAGNOSIS — R531 Weakness: Secondary | ICD-10-CM | POA: Diagnosis not present

## 2018-09-29 DIAGNOSIS — M1712 Unilateral primary osteoarthritis, left knee: Secondary | ICD-10-CM | POA: Diagnosis not present

## 2018-09-30 ENCOUNTER — Other Ambulatory Visit (INDEPENDENT_AMBULATORY_CARE_PROVIDER_SITE_OTHER): Payer: Self-pay | Admitting: Family Medicine

## 2018-09-30 ENCOUNTER — Encounter (INDEPENDENT_AMBULATORY_CARE_PROVIDER_SITE_OTHER): Payer: Self-pay | Admitting: Family Medicine

## 2018-09-30 ENCOUNTER — Ambulatory Visit: Payer: PRIVATE HEALTH INSURANCE | Admitting: Psychology

## 2018-09-30 DIAGNOSIS — E559 Vitamin D deficiency, unspecified: Secondary | ICD-10-CM

## 2018-09-30 NOTE — Telephone Encounter (Signed)
Please advise 

## 2018-10-01 ENCOUNTER — Ambulatory Visit (INDEPENDENT_AMBULATORY_CARE_PROVIDER_SITE_OTHER): Payer: Medicare Other | Admitting: Family Medicine

## 2018-10-01 ENCOUNTER — Other Ambulatory Visit: Payer: Self-pay

## 2018-10-01 VITALS — BP 120/75 | HR 71 | Temp 98.2°F | Ht 63.0 in | Wt 200.0 lb

## 2018-10-01 DIAGNOSIS — R531 Weakness: Secondary | ICD-10-CM | POA: Diagnosis not present

## 2018-10-01 DIAGNOSIS — Z6835 Body mass index (BMI) 35.0-35.9, adult: Secondary | ICD-10-CM | POA: Diagnosis not present

## 2018-10-01 DIAGNOSIS — M1712 Unilateral primary osteoarthritis, left knee: Secondary | ICD-10-CM | POA: Diagnosis not present

## 2018-10-01 DIAGNOSIS — E559 Vitamin D deficiency, unspecified: Secondary | ICD-10-CM

## 2018-10-01 MED ORDER — VITAMIN D (ERGOCALCIFEROL) 1.25 MG (50000 UNIT) PO CAPS: 50000.0000 [IU] | ORAL_CAPSULE | ORAL | 0 refills | Status: DC

## 2018-10-04 NOTE — Progress Notes (Signed)
Office: [phone]  /  Fax: [phone]   HPI:   Chief Complaint: OBESITY Jennifer [name] is here to discuss her progress with her obesity treatment plan. She is on the Category 1 plan and is following her eating plan approximately 98 % of the time. She states she is exercising 0 minutes 0 times per week. Shemaiah continues to do well with weight loss. Her hunger is mostly controlled and she is working on meal planning. She is considering traveling with family and is worried about how to eat healthy on the road.  Her weight is 200 lb (90.7 kg) today and has had a weight loss of 1 pound over a period of 2 weeks since her last visit. She has lost 8 lbs since starting treatment with [place].  Vitamin D Deficiency Shylah has a diagnosis of vitamin D deficiency. She is stable on prescription Vit D and denies nausea, vomiting or muscle weakness.  ASSESSMENT AND PLAN:  Vitamin D deficiency - Plan: Vitamin D, Ergocalciferol, (DRISDOL) 1.25 MG (50000 UT) CAPS capsule  Class 2 severe obesity with serious comorbidity and body mass index (BMI) of 35.0 to 35.9 in adult, unspecified obesity type ([place])  PLAN:  Vitamin D Deficiency [name] was informed that low vitamin D levels contributes to fatigue and are associated with obesity, breast, and colon cancer. [name] agrees to continue taking prescription Vit D 50,000 IU every week #4 and we will refill for 1 month. She will follow up for routine testing of vitamin D, at least 2-3 times per year. She was informed of the risk of over-replacement of vitamin D and agrees to not increase her dose unless she discusses this with [place] first. [name] agrees to follow up with our clinic in 2 weeks.  Obesity [name] is currently in the action stage of change. As such, her goal is to continue with weight loss efforts She has agreed to follow the Category 1 plan [name] has been instructed to work up to a goal of 150 minutes of combined cardio and strengthening exercise per  week for weight loss and overall health benefits. We discussed the following Behavioral Modification Strategies today: work on meal planning and easy cooking plans and travel eating strategies    [name] has agreed to follow up with our clinic in 2 weeks. She was informed of the importance of frequent follow up visits to maximize her success with intensive lifestyle modifications for her multiple health conditions.  ALLERGIES: Allergies  Allergen Reactions  . Crestor [Rosuvastatin] Other (See Comments)    Muscle aches  . Neomycin-Bacitracin Zn-Polymyx Swelling    OPTHALMIC EXPOSURE ONLY - - SWELLING EYES   . Neomycin-Bacitracin-Polymyxin [Bacitracin-Neomycin-Polymyxin] Swelling    Reaction to eye drops  . Codeine Rash  . Feldene [Piroxicam] Rash  . Penicillins Rash    Has patient had a PCN reaction causing immediate rash, facial/tongue/throat swelling, SOB or lightheadedness with hypotension:Yes Has patient had a PCN reaction causing severe rash involving mucus membranes or skin necrosis: No Has patient had a PCN reaction that required hospitalization:No Has patient had a PCN reaction occurring within the last 10 years:No If all of the above answers are "NO", then may proceed with Cephalosporin use.   . Piroxicam Rash  . Sulfa Antibiotics Rash  . Sulfonamide Derivatives Rash    MEDICATIONS: Current Outpatient Medications on File Prior to Visit  Medication Sig Dispense Refill  . Biotin 1000 MCG tablet Take 1,000 mcg by mouth daily.    Marland Kitchen CALCIUM CARBONATE PO Take  600 mg by mouth daily.     . cetirizine (ZYRTEC) 10 MG tablet Take 10 mg by mouth every evening.     . citalopram (CELEXA) 40 MG tablet TAKE 1 TABLET BY MOUTH EVERY DAY (Patient taking differently: Take 40 mg by mouth daily. ) 90 tablet 1  . hydroxypropyl methylcellulose / hypromellose (ISOPTO TEARS / GONIOVISC) 2.5 % ophthalmic solution Place 1 drop into both eyes 2 (two) times daily as needed for dry eyes.    Marland Kitchen  levETIRAcetam (KEPPRA) 500 MG tablet Take 1 tablet (500 mg total) by mouth 2 (two) times daily. 180 tablet 3  . levothyroxine (SYNTHROID) 50 MCG tablet TAKE 1 TABLET BY MOUTH EVERY DAY 90 tablet 3  . losartan (COZAAR) 25 MG tablet Take 2 tablets (50 mg total) by mouth daily. Please make appointment for follow up 180 tablet 0  . MELATONIN PO Take 10 mg by mouth at bedtime.     . Multiple Vitamins-Minerals (MULTIPLE VITAMINS/WOMENS PO) Take 1 tablet by mouth daily.     . pravastatin (PRAVACHOL) 80 MG tablet TAKE 1 TABLET BY MOUTH EVERY DAY (Patient taking differently: Take 80 mg by mouth daily. ) 90 tablet 1   No current facility-administered medications on file prior to visit.     PAST MEDICAL HISTORY: Past Medical History:  Diagnosis Date  . Allergy   . Anxiety   . Constipation   . Depression   . Dysrhythmia    FLB - ? PAC'S COMES AND GOES  . Gallbladder problem   . GERD (gastroesophageal reflux disease)   . High cholesterol   . Hx of adenomatous polyp of colon    2012 - [facility]  . Hyperlipidemia   . Hypertension   . Hypothyroid   . Joint pain   . Osteoarthritis   . PLMD (periodic limb movement disorder) 04/24/2012  . Prediabetes   . Seizures ([facility])    08/29/13  . Sleep apnea    wears CPAP  . Vitamin D deficiency     PAST SURGICAL HISTORY: Past Surgical History:  Procedure Laterality Date  . APPENDECTOMY    . CESAREAN SECTION    . CHOLECYSTECTOMY  1978  . COLONOSCOPY    . DILATION AND CURETTAGE OF UTERUS    . JOINT REPLACEMENT  1998   right knee  . OSTEOTOMY PROXIMAL FEMORAL    . 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   Procedure: TOTAL HIP ARTHROPLASTY ANTERIOR APPROACH;  Surgeon: Frederik Pear, MD;  Location: [facility];  Service: Orthopedics;  Laterality: Right;  . TOTAL KNEE ARTHROPLASTY Left 07/20/2018   Procedure: Left Knee Arthroplasty;  Surgeon: Frederik Pear, MD;  Location: [facility];  Service: Orthopedics;  Laterality: Left;    SOCIAL HISTORY: Social History    Tobacco Use  . Smoking status: Never Smoker  . Smokeless tobacco: Never Used  Substance Use Topics  . Alcohol use: No    Alcohol/week: 0.0 standard drinks  . Drug use: No    FAMILY HISTORY: Family History  Problem Relation Age of Onset  . Cancer Father 53  . Esophageal cancer Father   . Alcoholism Father   . Heart disease Mother 74       Aortic valve disease  . Diabetes Mother   . Hypertension Mother   . Thyroid disease Mother   . Atrial fibrillation Brother   . Sleep apnea Brother   . Sleep apnea Sister   . Sleep apnea Sister   . Stomach cancer Maternal Grandmother   .  Colon cancer Neg Hx   . Rectal cancer Neg Hx     ROS: Review of Systems  Constitutional: Positive for weight loss.  Gastrointestinal: Negative for nausea and vomiting.  Musculoskeletal:       Negative muscle weakness    PHYSICAL EXAM: Blood pressure 120/75, pulse 71, temperature 98.2 F (36.8 C), temperature source Oral, height 5\' 3"  (1.6 m), weight 200 lb (90.7 kg), SpO2 97 %. Body mass index is 35.43 kg/m. Physical Exam Vitals signs reviewed.  Constitutional:      Appearance: Normal appearance. She is obese.  Cardiovascular:     Rate and Rhythm: Normal rate.     Pulses: Normal pulses.  Pulmonary:     Effort: Pulmonary effort is normal.     Breath sounds: Normal breath sounds.  Musculoskeletal: Normal range of motion.  Skin:    General: Skin is warm and dry.  Neurological:     Mental Status: She is alert and oriented to person, place, and time.  Psychiatric:        Mood and Affect: Mood normal.        Behavior: Behavior normal.     RECENT LABS AND TESTS: BMET    Component Value Date/Time   NA 139 08/25/2018 1210   K 4.3 08/25/2018 1210   CL 97 08/25/2018 1210   CO2 22 08/25/2018 1210   GLUCOSE 74 08/25/2018 1210   GLUCOSE 138 (H) 07/21/2018 0304   BUN 12 08/25/2018 1210   CREATININE 0.74 08/25/2018 1210   CREATININE 0.72 09/02/2015 1207   CALCIUM 9.9 08/25/2018 1210    GFRNONAA 84 08/25/2018 1210   GFRNONAA 89 09/02/2015 1207   GFRAA 97 08/25/2018 1210   GFRAA >89 09/02/2015 1207   Lab Results  Component Value Date   HGBA1C 5.4 08/25/2018   HGBA1C 5.2 09/16/2017   HGBA1C 5.4 03/20/2017   HGBA1C 5.1 09/25/2016   HGBA1C 5.9 03/13/2016   Lab Results  Component Value Date   INSULIN 14.7 08/25/2018   CBC    Component Value Date/Time   WBC 15.6 (H) 07/21/2018 0304   RBC 3.50 (L) 07/21/2018 0304   HGB 11.0 (L) 07/21/2018 0304   HCT 33.5 (L) 07/21/2018 0304   PLT 276 07/21/2018 0304   MCV 95.7 07/21/2018 0304   MCV 87.4 09/02/2015 1225   MCH 31.4 07/21/2018 0304   MCHC 32.8 07/21/2018 0304   RDW 12.7 07/21/2018 0304   LYMPHSABS 1.2 07/16/2018 1121   MONOABS 0.7 07/16/2018 1121   EOSABS 0.1 07/16/2018 1121   BASOSABS 0.1 07/16/2018 1121   Iron/TIBC/Ferritin/ %Sat No results found for: IRON, TIBC, FERRITIN, IRONPCTSAT Lipid Panel     Component Value Date/Time   CHOL 281 (H) 08/25/2018 1210   TRIG 345 (H) 08/25/2018 1210   HDL 40 08/25/2018 1210   CHOLHDL 5 05/04/2018 0805   VLDL 78.4 (H) 05/04/2018 0805   LDLCALC 172 (H) 08/25/2018 1210   LDLDIRECT 96.0 05/04/2018 0805   Hepatic Function Panel     Component Value Date/Time   PROT 6.9 08/25/2018 1210   ALBUMIN 4.5 08/25/2018 1210   AST 25 08/25/2018 1210   ALT 24 08/25/2018 1210   ALKPHOS 124 (H) 08/25/2018 1210   BILITOT 0.5 08/25/2018 1210      Component Value Date/Time   TSH 2.380 08/25/2018 1210   TSH 2.27 05/04/2018 0805   TSH 2.85 09/16/2017 0918      OBESITY BEHAVIORAL INTERVENTION VISIT  Today's visit was # 4   Starting weight:  208 lbs Starting date: 08/25/2018 Today's weight : 200 lbs  Today's date: 10/01/2018 Total lbs lost to date: 8 At least 15 minutes were spent on discussing the following behavioral intervention visit.   ASK: We discussed the diagnosis of obesity with Jeannie Done today and Arieyana agreed to give [place] permission to discuss  obesity behavioral modification therapy today.  ASSESS: Sebastiana has the diagnosis of obesity and her BMI today is 35.44 Masiel is in the action stage of change   ADVISE: Demetri was educated on the multiple health risks of obesity as well as the benefit of weight loss to improve her health. She was advised of the need for long term treatment and the importance of lifestyle modifications to improve her current health and to decrease her risk of future health problems.  AGREE: Multiple dietary modification options and treatment options were discussed and  Keimari agreed to follow the recommendations documented in the above note.  ARRANGE: Arly was educated on the importance of frequent visits to treat obesity as outlined per CMS and USPSTF guidelines and agreed to schedule her next follow up appointment today.  I, Trixie Dredge, am acting as transcriptionist for Dennard Nip, MD  I have reviewed the above documentation for accuracy and completeness, and I agree with the above. -Dennard Nip, MD

## 2018-10-06 DIAGNOSIS — M1712 Unilateral primary osteoarthritis, left knee: Secondary | ICD-10-CM | POA: Diagnosis not present

## 2018-10-06 DIAGNOSIS — R531 Weakness: Secondary | ICD-10-CM | POA: Diagnosis not present

## 2018-10-07 ENCOUNTER — Other Ambulatory Visit: Payer: Self-pay

## 2018-10-07 ENCOUNTER — Ambulatory Visit (INDEPENDENT_AMBULATORY_CARE_PROVIDER_SITE_OTHER): Payer: Medicare Other | Admitting: Psychology

## 2018-10-07 DIAGNOSIS — F3289 Other specified depressive episodes: Secondary | ICD-10-CM | POA: Diagnosis not present

## 2018-10-08 DIAGNOSIS — M1712 Unilateral primary osteoarthritis, left knee: Secondary | ICD-10-CM | POA: Diagnosis not present

## 2018-10-08 DIAGNOSIS — R531 Weakness: Secondary | ICD-10-CM | POA: Diagnosis not present

## 2018-10-12 DIAGNOSIS — M1712 Unilateral primary osteoarthritis, left knee: Secondary | ICD-10-CM | POA: Diagnosis not present

## 2018-10-12 DIAGNOSIS — R531 Weakness: Secondary | ICD-10-CM | POA: Diagnosis not present

## 2018-10-13 ENCOUNTER — Encounter (INDEPENDENT_AMBULATORY_CARE_PROVIDER_SITE_OTHER): Payer: Self-pay | Admitting: Family Medicine

## 2018-10-13 ENCOUNTER — Other Ambulatory Visit: Payer: Self-pay

## 2018-10-13 ENCOUNTER — Ambulatory Visit (INDEPENDENT_AMBULATORY_CARE_PROVIDER_SITE_OTHER): Payer: Medicare Other | Admitting: Family Medicine

## 2018-10-13 VITALS — BP 120/69 | HR 66 | Temp 97.5°F | Ht 63.0 in | Wt 195.0 lb

## 2018-10-13 DIAGNOSIS — K5909 Other constipation: Secondary | ICD-10-CM

## 2018-10-13 DIAGNOSIS — Z6834 Body mass index (BMI) 34.0-34.9, adult: Secondary | ICD-10-CM

## 2018-10-13 DIAGNOSIS — E6609 Other obesity due to excess calories: Secondary | ICD-10-CM | POA: Diagnosis not present

## 2018-10-14 ENCOUNTER — Other Ambulatory Visit: Payer: Self-pay

## 2018-10-14 ENCOUNTER — Ambulatory Visit: Payer: PRIVATE HEALTH INSURANCE | Admitting: Psychology

## 2018-10-14 DIAGNOSIS — Z20828 Contact with and (suspected) exposure to other viral communicable diseases: Secondary | ICD-10-CM | POA: Diagnosis not present

## 2018-10-14 DIAGNOSIS — Z20822 Contact with and (suspected) exposure to covid-19: Secondary | ICD-10-CM

## 2018-10-14 NOTE — Progress Notes (Signed)
Office: [phone]  /  Fax: [phone]   HPI:   Chief Complaint: OBESITY Jennifer [name] is here to discuss her progress with her obesity treatment plan. She is on the Category 1 plan and is following her eating plan approximately 98-99 % of the time. She states she is exercising 0 minutes 0 times per week. Jennifer [name] has [name] well with weight loss on her Category plan. She has been cleared to exercise by Orthopedic and would like to know how to start.  Her weight is 195 lb (88.5 kg) today and has had a weight loss of 5 pounds over a period of 2 weeks since her last visit. She has lost 13 lbs since starting treatment with [place].  Constipation Jennifer [name] notes BM are less frequent and she is having to strain. She states BM are hard and painful. She denies abdominal pain or rectal bleeding.  ASSESSMENT AND PLAN:  Other constipation  Class 1 obesity due to excess calories without serious comorbidity with body mass index (BMI) of 34.0 to 34.9 in adult  PLAN:  Constipation Jennifer [name] was informed decrease bowel movement frequency is normal while losing weight, but stools should not be hard or painful. She was educated on increasing high fiber foods and increasing her H20 intake . High fiber foods were discussed today. Jennifer [name] is ok to take OTC miralax 17 g as needed. Jennifer [name] agrees to follow up with our clinic in 2 to 3 weeks.  I spent > than 50% of the 25 minute visit on counseling as documented in the note.  Obesity Jennifer [name] is currently in the action stage of change. As such, her goal is to continue with weight loss efforts She has agreed to follow the Category 1 plan Jennifer [name] has been instructed to work up to a goal of 150 minutes of combined cardio and strengthening exercise per week or start Jennifer [name] low impact walking tapes for 10 minutes 3-4 times per week for weight loss and overall health benefits. We discussed the following Behavioral Modification Strategies today: increasing lean  protein intake, increase H20 intake, and increasing fiber rich foods   Jennifer [name] has agreed to follow up with our clinic in 2 to 3 weeks. She was informed of the importance of frequent follow up visits to maximize her success with intensive lifestyle modifications for her multiple health conditions.  ALLERGIES: Allergies  Allergen Reactions  . Crestor [Rosuvastatin] Other (See Comments)    Muscle aches  . Neomycin-Bacitracin Zn-Polymyx Swelling    OPTHALMIC EXPOSURE ONLY - - SWELLING EYES   . Neomycin-Bacitracin-Polymyxin [Bacitracin-Neomycin-Polymyxin] Swelling    Reaction to eye drops  . Codeine Rash  . Feldene [Piroxicam] Rash  . Penicillins Rash    Has patient had a PCN reaction causing immediate rash, facial/tongue/throat swelling, SOB or lightheadedness with hypotension:Yes Has patient had a PCN reaction causing severe rash involving mucus membranes or skin necrosis: No Has patient had a PCN reaction that required hospitalization:No Has patient had a PCN reaction occurring within the last 10 years:No If all of the above answers are "NO", then may proceed with Cephalosporin use.   . Piroxicam Rash  . Sulfa Antibiotics Rash  . Sulfonamide Derivatives Rash    MEDICATIONS: Current Outpatient Medications on File Prior to Visit  Medication Sig Dispense Refill  . Biotin 1000 MCG tablet Take 1,000 mcg by mouth daily.    Marland Kitchen CALCIUM CARBONATE PO Take 600 mg by mouth daily.     . cetirizine (ZYRTEC) 10 MG tablet Take 10 mg  by mouth every evening.     . citalopram (CELEXA) 40 MG tablet TAKE 1 TABLET BY MOUTH EVERY DAY (Patient taking differently: Take 40 mg by mouth daily. ) 90 tablet 1  . hydroxypropyl methylcellulose / hypromellose (ISOPTO TEARS / GONIOVISC) 2.5 % ophthalmic solution Place 1 drop into both eyes 2 (two) times daily as needed for dry eyes.    Marland Kitchen levETIRAcetam (KEPPRA) 500 MG tablet Take 1 tablet (500 mg total) by mouth 2 (two) times daily. 180 tablet 3  . levothyroxine  (SYNTHROID) 50 MCG tablet TAKE 1 TABLET BY MOUTH EVERY DAY 90 tablet 3  . losartan (COZAAR) 25 MG tablet Take 2 tablets (50 mg total) by mouth daily. Please make appointment for follow up 180 tablet 0  . MELATONIN PO Take 10 mg by mouth at bedtime.     . Multiple Vitamins-Minerals (MULTIPLE VITAMINS/WOMENS PO) Take 1 tablet by mouth daily.     . pravastatin (PRAVACHOL) 80 MG tablet TAKE 1 TABLET BY MOUTH EVERY DAY (Patient taking differently: Take 80 mg by mouth daily. ) 90 tablet 1  . Vitamin D, Ergocalciferol, (DRISDOL) 1.25 MG (50000 UT) CAPS capsule Take 1 capsule (50,000 Units total) by mouth every 7 (seven) days. 4 capsule 0   No current facility-administered medications on file prior to visit.     PAST MEDICAL HISTORY: Past Medical History:  Diagnosis Date  . Allergy   . Anxiety   . Constipation   . Depression   . Dysrhythmia    FLB - ? PAC'S COMES AND GOES  . Gallbladder problem   . GERD (gastroesophageal reflux disease)   . High cholesterol   . Hx of adenomatous polyp of colon    2012 - [facility]  . Hyperlipidemia   . Hypertension   . Hypothyroid   . Joint pain   . Osteoarthritis   . PLMD (periodic limb movement disorder) 04/24/2012  . Prediabetes   . Seizures ([place])    08/29/13  . Sleep apnea    wears CPAP  . Vitamin D deficiency     PAST SURGICAL HISTORY: Past Surgical History:  Procedure Laterality Date  . APPENDECTOMY    . CESAREAN SECTION    . CHOLECYSTECTOMY  1978  . COLONOSCOPY    . DILATION AND CURETTAGE OF UTERUS    . JOINT REPLACEMENT  1998   right knee  . OSTEOTOMY PROXIMAL FEMORAL    . 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   Procedure: TOTAL HIP ARTHROPLASTY ANTERIOR APPROACH;  Surgeon: Frederik Pear, MD;  Location: [facility];  Service: Orthopedics;  Laterality: Right;  . TOTAL KNEE ARTHROPLASTY Left 07/20/2018   Procedure: Left Knee Arthroplasty;  Surgeon: Frederik Pear, MD;  Location: [facility];  Service: Orthopedics;  Laterality: Left;    SOCIAL  HISTORY: Social History   Tobacco Use  . Smoking status: Never Smoker  . Smokeless tobacco: Never Used  Substance Use Topics  . Alcohol use: No    Alcohol/week: 0.0 standard drinks  . Drug use: No    FAMILY HISTORY: Family History  Problem Relation Age of Onset  . Cancer Father 25  . Esophageal cancer Father   . Alcoholism Father   . Heart disease Mother 67       Aortic valve disease  . Diabetes Mother   . Hypertension Mother   . Thyroid disease Mother   . Atrial fibrillation Brother   . Sleep apnea Brother   . Sleep apnea Sister   . Sleep apnea Sister   .  Stomach cancer Maternal Grandmother   . Colon cancer Neg Hx   . Rectal cancer Neg Hx     ROS: Review of Systems  Constitutional: Positive for weight loss.  Gastrointestinal: Positive for constipation. Negative for abdominal pain.       Negative rectal bleeding    PHYSICAL EXAM: Blood pressure 120/69, pulse 66, temperature (!) 97.5 F (36.4 C), temperature source Oral, height 5\' 3"  (1.6 m), weight 195 lb (88.5 kg), SpO2 95 %. Body mass index is 34.54 kg/m. Physical Exam Vitals signs reviewed.  Constitutional:      Appearance: Normal appearance. She is obese.  Cardiovascular:     Rate and Rhythm: Normal rate.     Pulses: Normal pulses.  Pulmonary:     Effort: Pulmonary effort is normal.     Breath sounds: Normal breath sounds.  Musculoskeletal: Normal range of motion.  Skin:    General: Skin is warm and dry.  Neurological:     Mental Status: She is alert and oriented to person, place, and time.  Psychiatric:        Mood and Affect: Mood normal.        Behavior: Behavior normal.     RECENT LABS AND TESTS: BMET    Component Value Date/Time   NA 139 08/25/2018 1210   K 4.3 08/25/2018 1210   CL 97 08/25/2018 1210   CO2 22 08/25/2018 1210   GLUCOSE 74 08/25/2018 1210   GLUCOSE 138 (H) 07/21/2018 0304   BUN 12 08/25/2018 1210   CREATININE 0.74 08/25/2018 1210   CREATININE 0.72 09/02/2015 1207    CALCIUM 9.9 08/25/2018 1210   GFRNONAA 84 08/25/2018 1210   GFRNONAA 89 09/02/2015 1207   GFRAA 97 08/25/2018 1210   GFRAA >89 09/02/2015 1207   Lab Results  Component Value Date   HGBA1C 5.4 08/25/2018   HGBA1C 5.2 09/16/2017   HGBA1C 5.4 03/20/2017   HGBA1C 5.1 09/25/2016   HGBA1C 5.9 03/13/2016   Lab Results  Component Value Date   INSULIN 14.7 08/25/2018   CBC    Component Value Date/Time   WBC 15.6 (H) 07/21/2018 0304   RBC 3.50 (L) 07/21/2018 0304   HGB 11.0 (L) 07/21/2018 0304   HCT 33.5 (L) 07/21/2018 0304   PLT 276 07/21/2018 0304   MCV 95.7 07/21/2018 0304   MCV 87.4 09/02/2015 1225   MCH 31.4 07/21/2018 0304   MCHC 32.8 07/21/2018 0304   RDW 12.7 07/21/2018 0304   LYMPHSABS 1.2 07/16/2018 1121   MONOABS 0.7 07/16/2018 1121   EOSABS 0.1 07/16/2018 1121   BASOSABS 0.1 07/16/2018 1121   Iron/TIBC/Ferritin/ %Sat No results found for: IRON, TIBC, FERRITIN, IRONPCTSAT Lipid Panel     Component Value Date/Time   CHOL 281 (H) 08/25/2018 1210   TRIG 345 (H) 08/25/2018 1210   HDL 40 08/25/2018 1210   CHOLHDL 5 05/04/2018 0805   VLDL 78.4 (H) 05/04/2018 0805   LDLCALC 172 (H) 08/25/2018 1210   LDLDIRECT 96.0 05/04/2018 0805   Hepatic Function Panel     Component Value Date/Time   PROT 6.9 08/25/2018 1210   ALBUMIN 4.5 08/25/2018 1210   AST 25 08/25/2018 1210   ALT 24 08/25/2018 1210   ALKPHOS 124 (H) 08/25/2018 1210   BILITOT 0.5 08/25/2018 1210      Component Value Date/Time   TSH 2.380 08/25/2018 1210   TSH 2.27 05/04/2018 0805   TSH 2.85 09/16/2017 0918      OBESITY BEHAVIORAL INTERVENTION VISIT  Today's  visit was # 5   Starting weight: 208 lbs Starting date: 08/25/2018 Today's weight : 195 lbs Today's date: 10/13/2018 Total lbs lost to date: 93    ASK: We discussed the diagnosis of obesity with Jennifer [name] today and Jennifer [name] agreed to give [place] permission to discuss obesity behavioral modification therapy today.  ASSESS:  Jennifer [name] has the diagnosis of obesity and her BMI today is 34.55 Jennifer [name] is in the action stage of change   ADVISE: Jennifer [name] was educated on the multiple health risks of obesity as well as the benefit of weight loss to improve her health. She was advised of the need for long term treatment and the importance of lifestyle modifications to improve her current health and to decrease her risk of future health problems.  AGREE: Multiple dietary modification options and treatment options were discussed and  Jennifer [name] agreed to follow the recommendations documented in the above note.  ARRANGE: Jahliyah was educated on the importance of frequent visits to treat obesity as outlined per CMS and USPSTF guidelines and agreed to schedule her next follow up appointment today.  I, Jennifer [name], am acting as transcriptionist for Dennard Nip, MD  I have reviewed the above documentation for accuracy and completeness, and I agree with the above. -Dennard Nip, MD

## 2018-10-15 LAB — NOVEL CORONAVIRUS, NAA: SARS-CoV-2, NAA: NOT DETECTED

## 2018-10-19 NOTE — Progress Notes (Signed)
Office: [phone]  /  Fax: [phone]    Date: October 29, 2018   Appointment Start Time: 2:03pm Duration: 26 minutes Provider: Glennie Isle, Psy.D. Type of Session: Individual Therapy  Location of Patient: Home Location of Provider: [facility] Type of Contact: Telepsychological Visit via [place] only  Session Content:Of note, this provider called Jennifer [name] at 2:02pm as she did not present for the [facility] appointment. She shared she was in the process of joining, but experienced difficulty connecting with video capabilities. As such, today's appointment was initiated 3 minutes late. Jennifer [name] is a 68 y.o. female presenting via [employer] (audio only) for a follow-up appointment to address the previously established treatment goal of decreasing emotional eating. Today's appointment was a telepsychological visit, as it is an option for appointments to reduce exposure to COVID-19. Jennifer [name] expressed understanding regarding the rationale for telepsychological services, and provided verbal consent for today's appointment. Prior to proceeding with today's appointment, Jennifer [name]'s physical location at the time of this appointment was obtained. Jennifer [name] reported she was at home and provided the address. In the event of technical difficulties, Jennifer [name] shared a phone number she could be reached at. Jennifer [name] and this provider participated in today's telepsychological service. Also, Jennifer [name] denied anyone else being present in the room or on the WebEx appointment.  This provider conducted a brief check-in and verbally administered the PHQ-9 and GAD-7. Jennifer [name] shared about her recent trip to [place], which included attending memorial services. She shared her family was "supportive" with her meal plan during her trip. Jennifer [name] described making better choices during her vacation. Positive reinforcement was provided. She also described a reduction in emotional eating since the onset of  treatment with this provider. Remainder of today's appointment focused further on mindfulness. She shared about engaging in the exercise involving her senses, including when she was triggered to emotionally eat. She further described mindfulness as contributing to an increase in overall awareness. Psychoeducation regarding formal (e.g., setting aside a specific time daily to engage in an exercise) and informal (e.g., cultivating awareness in the present moment and taking a non-judgmental approach while engaging in day-to-day tasks) mindfulness was provided. This provider also discussed the utilization of YouTube for mindfulness exercises (e.g., videos by Jennifer [name]). Moreover, Jennifer [name] was led through a mindfulness exercise during today's appointment focusing on her breath as an anchor. Her experience after was processed. Jennifer [name] shared, "That was very calming." She further stated she was able to bring her awareness back to her breath each time her mind wandered. Jennifer [name] provided verbal consent during today's appointment for this provider to send the handout for today's exercise via e-mail. Termination planning was also discussed, as Jennifer [name] noted she will be resuming services with her other therapist following termination of services with this provider. Based on progress to date, a follow-up/termination appointment will be scheduled in four weeks, and Jennifer [name] will schedule an appointment with her other therapist after that appointment. Jennifer [name] was receptive to today's session as evidenced by openness to sharing, responsiveness to feedback, and willingness to continue engaging in learned skills.  Mental Status Examination:  Appearance: unable to assess  Behavior: cooperative Mood: euthymic Affect: unable to fully assess Speech: normal in rate, volume, and tone Eye Contact: unable to assess Psychomotor Activity: unable to assess Thought Process: linear, logical, and goal directed  Content/Perceptual  Disturbances: no hallucinations, delusions, bizarre thinking or behavior reported or observed and no evidence of suicidal and homicidal ideation, plan, and intent Orientation: time, person, place  and purpose of appointment Cognition/Sensorium: memory, attention, language, and fund of knowledge intact  Insight: good Judgment: good  Structured Assessment Results: The Patient Health Questionnaire-9 (PHQ-9) is a self-report measure that assesses symptoms and severity of depression over the course of the last two weeks. Jennifer [name] obtained a score of 0. Little interest or pleasure in doing things 0  Feeling down, depressed, or hopeless 0  Trouble falling or staying asleep, or sleeping too much 0  Feeling tired or having little energy 0  Poor appetite or overeating 0  Feeling bad about yourself --- or that you are a failure or have let yourself or your family down 0  Trouble concentrating on things, such as reading the newspaper or watching television 0  Moving or speaking so slowly that other people could have noticed? Or the opposite --- being so fidgety or restless that you have been moving around a lot more than usual 0  Thoughts that you would be better off dead or hurting yourself in some way 0  PHQ-9 Score 0    The Generalized Anxiety Disorder-7 (GAD-7) is a brief self-report measure that assesses symptoms of anxiety over the course of the last two weeks. Jennifer [name] obtained a score of 0. Feeling nervous, anxious, on edge 0  Not being able to stop or control worrying 0  Worrying too much about different things 0  Trouble relaxing 0  Being so restless that it's hard to sit still 0  Becoming easily annoyed or irritable 0  Feeling afraid as if something awful might happen 0  GAD-7 Score 0   Interventions:  Conducted a brief chart review Verbal administration of PHQ-9 and GAD-7 for symptom monitoring Provided empathic reflections and validation Reviewed content from the previous session  Psychoeducation provided regarding mindfulness Engaged patient in a mindfulness exercise Discussed termination planning Provided positive reinforcement Employed supportive psychotherapy interventions to facilitate reduced distress, and to improve coping skills with identified stressors Employed acceptance and commitment interventions to emphasize mindfulness and acceptance without struggle  DSM-5 Diagnosis: 311 (F32.8) Other Specified Depressive Disorder, Emotional Eating Behaviors  Treatment Goal & Progress: During the initial appointment with this provider, the following treatment goal was established: decrease emotional eating. Tereatha has demonstrated progress in her goal as evidenced by increased awareness of hunger patterns and triggers for emotional eating. Sheyenne also reported a reduction in emotional eating and continues to demonstrate willingness to engage in learned skill(s).  Plan: Jennifer [name] continues to appear able and willing to participate as evidenced by engagement in reciprocal conversation, and asking questions for clarification as appropriate. The next appointment will be scheduled in one month, which will be via [employer]. The next session will focus on reviewing learned skills, and termination.

## 2018-10-22 ENCOUNTER — Other Ambulatory Visit (INDEPENDENT_AMBULATORY_CARE_PROVIDER_SITE_OTHER): Payer: Self-pay | Admitting: Family Medicine

## 2018-10-22 DIAGNOSIS — E559 Vitamin D deficiency, unspecified: Secondary | ICD-10-CM

## 2018-10-25 ENCOUNTER — Other Ambulatory Visit: Payer: Self-pay | Admitting: Family Medicine

## 2018-10-25 DIAGNOSIS — E119 Type 2 diabetes mellitus without complications: Secondary | ICD-10-CM

## 2018-10-25 DIAGNOSIS — E782 Mixed hyperlipidemia: Secondary | ICD-10-CM

## 2018-10-26 ENCOUNTER — Other Ambulatory Visit (INDEPENDENT_AMBULATORY_CARE_PROVIDER_SITE_OTHER): Payer: Self-pay | Admitting: Family Medicine

## 2018-10-26 DIAGNOSIS — E559 Vitamin D deficiency, unspecified: Secondary | ICD-10-CM

## 2018-10-27 NOTE — Telephone Encounter (Signed)
Please schedule patient for follow up anytime now when possible. Thank you

## 2018-10-28 ENCOUNTER — Ambulatory Visit: Payer: PRIVATE HEALTH INSURANCE | Admitting: Psychology

## 2018-10-28 NOTE — Telephone Encounter (Signed)
10/28 virtual appointment

## 2018-10-28 NOTE — Telephone Encounter (Signed)
Left message asking pt to call office  °

## 2018-10-29 ENCOUNTER — Other Ambulatory Visit: Payer: Self-pay

## 2018-10-29 ENCOUNTER — Ambulatory Visit (INDEPENDENT_AMBULATORY_CARE_PROVIDER_SITE_OTHER): Payer: Medicare Other | Admitting: Psychology

## 2018-10-29 DIAGNOSIS — F3289 Other specified depressive episodes: Secondary | ICD-10-CM | POA: Diagnosis not present

## 2018-11-02 ENCOUNTER — Other Ambulatory Visit: Payer: Self-pay

## 2018-11-02 ENCOUNTER — Ambulatory Visit (INDEPENDENT_AMBULATORY_CARE_PROVIDER_SITE_OTHER): Payer: Medicare Other | Admitting: Family Medicine

## 2018-11-02 ENCOUNTER — Encounter (INDEPENDENT_AMBULATORY_CARE_PROVIDER_SITE_OTHER): Payer: Self-pay | Admitting: Family Medicine

## 2018-11-02 VITALS — BP 122/63 | HR 69 | Temp 98.0°F | Ht 63.0 in | Wt 192.0 lb

## 2018-11-02 DIAGNOSIS — E559 Vitamin D deficiency, unspecified: Secondary | ICD-10-CM

## 2018-11-02 DIAGNOSIS — E6609 Other obesity due to excess calories: Secondary | ICD-10-CM | POA: Diagnosis not present

## 2018-11-02 DIAGNOSIS — Z6834 Body mass index (BMI) 34.0-34.9, adult: Secondary | ICD-10-CM | POA: Diagnosis not present

## 2018-11-02 MED ORDER — VITAMIN D (ERGOCALCIFEROL) 1.25 MG (50000 UNIT) PO CAPS: 50000.0000 [IU] | ORAL_CAPSULE | ORAL | 0 refills | Status: DC

## 2018-11-02 NOTE — Progress Notes (Signed)
Office: [phone]  /  Fax: [phone]   HPI:   Chief Complaint: OBESITY [name] is here to discuss her progress with her obesity treatment plan. She is on the Category 1 plan and is following her eating plan approximately 95 % of the time. She states she is exercising 0 minutes 0 times per week. [name] did very well with weight loss even with traveling. She was able to make good choices and had good support from her family.  Her weight is 192 lb (87.1 kg) today and has had a weight loss of 3 pounds over a period of 3 weeks since her last visit. She has lost 16 lbs since starting treatment with [place].  Vitamin D Deficiency [name] has a diagnosis of vitamin D deficiency. She is currently stable on vit D, but is not yet at goal. [name] denies nausea, vomiting, or muscle weakness.  ASSESSMENT AND PLAN:  Vitamin D deficiency - Plan: Vitamin D, Ergocalciferol, (DRISDOL) 1.25 MG (50000 UT) CAPS capsule  Class 1 obesity due to excess calories without serious comorbidity with body mass index (BMI) of 34.0 to 34.9 in adult  PLAN:  Vitamin D Deficiency Jennifer [name] was informed that low vitamin D levels contribute to fatigue and are associated with obesity, breast, and colon cancer. Jennifer [name] agrees to continue to take prescription Vit D @50 ,000 IU every week #4 with no refills and will follow up for routine testing of vitamin D, at least 2-3 times per year. She was informed of the risk of over-replacement of vitamin D and agrees to not increase her dose unless she discusses this with [place] first. Jennifer [name] agrees to follow up in 2 to 3 weeks as directed.  Obesity Jennifer [name] is currently in the action stage of change. As such, her goal is to continue with weight loss efforts. She has agreed to follow the Category 1 plan. [name] has been instructed to work up to a goal of 150 minutes of combined cardio and strengthening exercise per week for weight loss and overall health benefits. We discussed the  following Behavioral Modification Strategies today: increasing lean protein intake and decreasing simple carbohydrates.   Jennifer [name] has agreed to follow up with our clinic in 2 to 3 weeks. She was informed of the importance of frequent follow up visits to maximize her success with intensive lifestyle modifications for her multiple health conditions.  ALLERGIES: Allergies  Allergen Reactions  . Crestor [Rosuvastatin] Other (See Comments)    Muscle aches  . Neomycin-Bacitracin Zn-Polymyx Swelling    OPTHALMIC EXPOSURE ONLY - - SWELLING EYES   . Neomycin-Bacitracin-Polymyxin [Bacitracin-Neomycin-Polymyxin] Swelling    Reaction to eye drops  . Codeine Rash  . Feldene [Piroxicam] Rash  . Penicillins Rash    Has patient had a PCN reaction causing immediate rash, facial/tongue/throat swelling, SOB or lightheadedness with hypotension:Yes Has patient had a PCN reaction causing severe rash involving mucus membranes or skin necrosis: No Has patient had a PCN reaction that required hospitalization:No Has patient had a PCN reaction occurring within the last 10 years:No If all of the above answers are "NO", then may proceed with Cephalosporin use.   . Piroxicam Rash  . Sulfa Antibiotics Rash  . Sulfonamide Derivatives Rash    MEDICATIONS: Current Outpatient Medications on File Prior to Visit  Medication Sig Dispense Refill  . Biotin 1000 MCG tablet Take 1,000 mcg by mouth daily.    Marland Kitchen CALCIUM CARBONATE PO Take 600 mg by mouth daily.     . cetirizine (ZYRTEC) 10  MG tablet Take 10 mg by mouth every evening.     . citalopram (CELEXA) 40 MG tablet TAKE 1 TABLET BY MOUTH EVERY DAY (Patient taking differently: Take 40 mg by mouth daily. ) 90 tablet 1  . hydroxypropyl methylcellulose / hypromellose (ISOPTO TEARS / GONIOVISC) 2.5 % ophthalmic solution Place 1 drop into both eyes 2 (two) times daily as needed for dry eyes.    Marland Kitchen levETIRAcetam (KEPPRA) 500 MG tablet Take 1 tablet (500 mg total) by mouth 2  (two) times daily. 180 tablet 3  . levothyroxine (SYNTHROID) 50 MCG tablet TAKE 1 TABLET BY MOUTH EVERY DAY 90 tablet 3  . losartan (COZAAR) 25 MG tablet Take 2 tablets (50 mg total) by mouth daily. Please make appointment for follow up 180 tablet 0  . MELATONIN PO Take 10 mg by mouth at bedtime.     . Multiple Vitamins-Minerals (MULTIPLE VITAMINS/WOMENS PO) Take 1 tablet by mouth daily.     . pravastatin (PRAVACHOL) 80 MG tablet TAKE 1 TABLET BY MOUTH EVERY DAY 90 tablet 1   No current facility-administered medications on file prior to visit.     PAST MEDICAL HISTORY: Past Medical History:  Diagnosis Date  . Allergy   . Anxiety   . Constipation   . Depression   . Dysrhythmia    FLB - ? PAC'S COMES AND GOES  . Gallbladder problem   . GERD (gastroesophageal reflux disease)   . High cholesterol   . Hx of adenomatous polyp of colon    2012 - [place]  . Hyperlipidemia   . Hypertension   . Hypothyroid   . Joint pain   . Osteoarthritis   . PLMD (periodic limb movement disorder) 04/24/2012  . Prediabetes   . Seizures ([place])    08/29/13  . Sleep apnea    wears CPAP  . Vitamin D deficiency     PAST SURGICAL HISTORY: Past Surgical History:  Procedure Laterality Date  . APPENDECTOMY    . CESAREAN SECTION    . CHOLECYSTECTOMY  1978  . COLONOSCOPY    . DILATION AND CURETTAGE OF UTERUS    . JOINT REPLACEMENT  1998   right knee  . OSTEOTOMY PROXIMAL FEMORAL    . 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   Procedure: TOTAL HIP ARTHROPLASTY ANTERIOR APPROACH;  Surgeon: Frederik Pear, MD;  Location: [place] [facility];  Service: Orthopedics;  Laterality: Right;  . TOTAL KNEE ARTHROPLASTY Left 07/20/2018   Procedure: Left Knee Arthroplasty;  Surgeon: Frederik Pear, MD;  Location: [facility];  Service: Orthopedics;  Laterality: Left;    SOCIAL HISTORY: Social History   Tobacco Use  . Smoking status: Never Smoker  . Smokeless tobacco: Never Used  Substance Use Topics  . Alcohol use: No     Alcohol/week: 0.0 standard drinks  . Drug use: No    FAMILY HISTORY: Family History  Problem Relation Age of Onset  . Cancer Father 84  . Esophageal cancer Father   . Alcoholism Father   . Heart disease Mother 77       Aortic valve disease  . Diabetes Mother   . Hypertension Mother   . Thyroid disease Mother   . Atrial fibrillation Brother   . Sleep apnea Brother   . Sleep apnea Sister   . Sleep apnea Sister   . Stomach cancer Maternal Grandmother   . Colon cancer Neg Hx   . Rectal cancer Neg Hx     ROS: Review of Systems  Constitutional: Positive  for weight loss.  Gastrointestinal: Negative for nausea and vomiting.  Musculoskeletal:       Negative for muscle weakness.    PHYSICAL EXAM: Blood pressure 122/63, pulse 69, temperature 98 F (36.7 C), temperature source Oral, height 5\' 3"  (1.6 m), weight 192 lb (87.1 kg), SpO2 93 %. Body mass index is 34.01 kg/m. Physical Exam Vitals signs reviewed.  Constitutional:      Appearance: Normal appearance. She is obese.  Cardiovascular:     Rate and Rhythm: Normal rate.  Pulmonary:     Effort: Pulmonary effort is normal.  Musculoskeletal: Normal range of motion.  Skin:    General: Skin is warm and dry.  Neurological:     Mental Status: She is alert and oriented to person, place, and time.  Psychiatric:        Mood and Affect: Mood normal.        Behavior: Behavior normal.     RECENT LABS AND TESTS: BMET    Component Value Date/Time   NA 139 08/25/2018 1210   K 4.3 08/25/2018 1210   CL 97 08/25/2018 1210   CO2 22 08/25/2018 1210   GLUCOSE 74 08/25/2018 1210   GLUCOSE 138 (H) 07/21/2018 0304   BUN 12 08/25/2018 1210   CREATININE 0.74 08/25/2018 1210   CREATININE 0.72 09/02/2015 1207   CALCIUM 9.9 08/25/2018 1210   GFRNONAA 84 08/25/2018 1210   GFRNONAA 89 09/02/2015 1207   GFRAA 97 08/25/2018 1210   GFRAA >89 09/02/2015 1207   Lab Results  Component Value Date   HGBA1C 5.4 08/25/2018   HGBA1C 5.2  09/16/2017   HGBA1C 5.4 03/20/2017   HGBA1C 5.1 09/25/2016   HGBA1C 5.9 03/13/2016   Lab Results  Component Value Date   INSULIN 14.7 08/25/2018   CBC    Component Value Date/Time   WBC 15.6 (H) 07/21/2018 0304   RBC 3.50 (L) 07/21/2018 0304   HGB 11.0 (L) 07/21/2018 0304   HCT 33.5 (L) 07/21/2018 0304   PLT 276 07/21/2018 0304   MCV 95.7 07/21/2018 0304   MCV 87.4 09/02/2015 1225   MCH 31.4 07/21/2018 0304   MCHC 32.8 07/21/2018 0304   RDW 12.7 07/21/2018 0304   LYMPHSABS 1.2 07/16/2018 1121   MONOABS 0.7 07/16/2018 1121   EOSABS 0.1 07/16/2018 1121   BASOSABS 0.1 07/16/2018 1121   Iron/TIBC/Ferritin/ %Sat No results found for: IRON, TIBC, FERRITIN, IRONPCTSAT Lipid Panel     Component Value Date/Time   CHOL 281 (H) 08/25/2018 1210   TRIG 345 (H) 08/25/2018 1210   HDL 40 08/25/2018 1210   CHOLHDL 5 05/04/2018 0805   VLDL 78.4 (H) 05/04/2018 0805   LDLCALC 172 (H) 08/25/2018 1210   LDLDIRECT 96.0 05/04/2018 0805   Hepatic Function Panel     Component Value Date/Time   PROT 6.9 08/25/2018 1210   ALBUMIN 4.5 08/25/2018 1210   AST 25 08/25/2018 1210   ALT 24 08/25/2018 1210   ALKPHOS 124 (H) 08/25/2018 1210   BILITOT 0.5 08/25/2018 1210      Component Value Date/Time   TSH 2.380 08/25/2018 1210   TSH 2.27 05/04/2018 0805   TSH 2.85 09/16/2017 0918   Results for Leroy Sea J "DEBBIE" (MRN [id number]) as of 11/02/2018 14:37  Ref. Range 08/25/2018 12:10  Vitamin D, 25-Hydroxy Latest Ref Range: 30.0 - 100.0 ng/mL 26.4 (L)    OBESITY BEHAVIORAL INTERVENTION VISIT  Today's visit was # 6  Starting weight: 208 lbs Starting date: 08/25/2018  Today's weight :  Weight: 192 lb (87.1 kg)  Today's date: 11/02/2018 Total lbs lost to date: 16 At least 15 minutes were spent on discussing the following behavioral intervention visit.    11/02/2018  Height 5\' 3"  (1.6 m)  Weight 192 lb (87.1 kg)  BMI (Calculated) 34.02  BLOOD PRESSURE - SYSTOLIC [id number]  BLOOD  PRESSURE - DIASTOLIC 63   Body Fat % 0000000 %  Total Body Water (lbs) 77.6 lbs    ASK: We discussed the diagnosis of obesity with Jennifer [name] today and Jennifer [name] agreed to give [place] permission to discuss obesity behavioral modification therapy today.  ASSESS: Jennifer [name] has the diagnosis of obesity and her BMI today is 34.02. Takai is in the action stage of change.   ADVISE: Jennifer [name] was educated on the multiple health risks of obesity as well as the benefit of weight loss to improve her health. She was advised of the need for long term treatment and the importance of lifestyle modifications to improve her current health and to decrease her risk of future health problems.  AGREE: Multiple dietary modification options and treatment options were discussed and Jennifer [name] agreed to follow the recommendations documented in the above note.  ARRANGE: Jennifer [name] was educated on the importance of frequent visits to treat obesity as outlined per CMS and USPSTF guidelines and agreed to schedule her next follow up appointment today.  IMarcille Blanco, CMA, am acting as transcriptionist for Starlyn Skeans, MD I have reviewed the above documentation for accuracy and completeness, and I agree with the above. -Dennard Nip, MD

## 2018-11-04 ENCOUNTER — Encounter: Payer: Self-pay | Admitting: Family Medicine

## 2018-11-04 ENCOUNTER — Ambulatory Visit (INDEPENDENT_AMBULATORY_CARE_PROVIDER_SITE_OTHER): Payer: Medicare Other | Admitting: Family Medicine

## 2018-11-04 ENCOUNTER — Other Ambulatory Visit: Payer: Self-pay

## 2018-11-04 VITALS — Ht 60.0 in | Wt 192.0 lb

## 2018-11-04 DIAGNOSIS — Z96652 Presence of left artificial knee joint: Secondary | ICD-10-CM

## 2018-11-04 DIAGNOSIS — E2839 Other primary ovarian failure: Secondary | ICD-10-CM

## 2018-11-04 DIAGNOSIS — F329 Major depressive disorder, single episode, unspecified: Secondary | ICD-10-CM | POA: Diagnosis not present

## 2018-11-04 DIAGNOSIS — Z6837 Body mass index (BMI) 37.0-37.9, adult: Secondary | ICD-10-CM | POA: Diagnosis not present

## 2018-11-04 NOTE — Progress Notes (Signed)
Virtual Visit via Video Note  I connected with Jennifer [name] on 11/04/18 at  2:30 PM EDT by a video enabled telemedicine application and verified that I am speaking with the correct person using two identifiers.  Location: Patient: In her home Provider: [place], Kathe Becton, NP student in room.    I discussed the limitations of evaluation and management by telemedicine and the availability of in person appointments. The patient expressed understanding and agreed to proceed.  History of Present Illness: This is a 68 year old female who presents today for virtual visit for follow-up of chronic medical conditions.  Obesity-she has been attending the medical weight management clinic and has lost almost 20 pounds.  She is very pleased with the results.  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  Depression-she has been seeing counselor associated with medical weight loss and will be going back to her regular counselor at [name].  She feels like her mood has been stable during pandemic.  She has been able to travel a little and to see family which has helped.   Past Medical History:  Diagnosis Date  . Allergy   . Anxiety   . Constipation   . Depression   . Dysrhythmia    FLB - ? PAC'S COMES AND GOES  . Gallbladder problem   . GERD (gastroesophageal reflux disease)   . High cholesterol   . Hx of adenomatous polyp of colon    2012 - [name]  . Hyperlipidemia   . Hypertension   . Hypothyroid   . Joint pain   . Osteoarthritis   . PLMD (periodic limb movement disorder) 04/24/2012  . Prediabetes   . Seizures ([name])    08/29/13  . Sleep apnea    wears CPAP  . Vitamin D deficiency    Past Surgical History:  Procedure Laterality Date  . APPENDECTOMY    . CESAREAN SECTION    . CHOLECYSTECTOMY  1978  . COLONOSCOPY    . DILATION AND CURETTAGE OF UTERUS    . JOINT  REPLACEMENT  1998   right knee  . OSTEOTOMY PROXIMAL FEMORAL    . 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   Procedure: TOTAL HIP ARTHROPLASTY ANTERIOR APPROACH;  Surgeon: Frederik Pear, MD;  Location: [name];  Service: Orthopedics;  Laterality: Right;  . TOTAL KNEE ARTHROPLASTY Left 07/20/2018   Procedure: Left Knee Arthroplasty;  Surgeon: Frederik Pear, MD;  Location: [facility];  Service: Orthopedics;  Laterality: Left;   Family History  Problem Relation Age of Onset  . Cancer Father 55  . Esophageal cancer Father   . Alcoholism Father   . Heart disease Mother 11       Aortic valve disease  . Diabetes Mother   . Hypertension Mother   . Thyroid disease Mother   . Atrial fibrillation Brother   . Sleep apnea Brother   . Sleep apnea Sister   . Sleep apnea Sister   . Stomach cancer Maternal Grandmother   . Colon cancer Neg Hx   . Rectal cancer Neg Hx    Social History   Tobacco Use  . Smoking status: Never Smoker  . Smokeless tobacco: Never Used  Substance Use Topics  . Alcohol use: No    Alcohol/week: 0.0 standard drinks  . Drug use: No      Observations/Objective: Patient is alert and answers questions appropriately.  Visible skin is  unremarkable.  She is normally conversive without shortness of breath.  Mood and affect are appropriate. Ht 5' (1.524 m)   Wt 192 lb (87.1 kg)   BMI 37.50 kg/m  Wt Readings from Last 3 Encounters:  11/04/18 192 lb (87.1 kg)  11/02/18 192 lb (87.1 kg)  10/13/18 195 lb (88.5 kg)    Assessment and Plan: 1. Estrogen deficiency -We will have her get bone density with her next mammogram - [facility] Bone Density; Future  2. Reactive depression -Currently doing well on medication and therapy  3. S/P total knee arthroplasty, left -Significant improvement of pain and mobility  4. Class 2 severe obesity due to excess calories with serious comorbidity and body mass index (BMI) of 37.0 to 37.9 in adult [facility]) -Encouraged her efforts with weight  loss  -Follow-up in 6 months for annual exam   Jennifer Reamer, FNP-BC  [facility], [place]  11/05/2018 9:02 AM   Follow Up Instructions:    I discussed the assessment and treatment plan with the patient. The patient was provided an opportunity to ask questions and all were answered. The patient agreed with the plan and demonstrated an understanding of the instructions.   The patient was advised to call back or seek an in-person evaluation if the symptoms worsen or if the condition fails to improve as anticipated.    Elby Beck, FNP

## 2018-11-05 ENCOUNTER — Encounter: Payer: Self-pay | Admitting: Family Medicine

## 2018-11-11 ENCOUNTER — Ambulatory Visit: Payer: Medicare Other | Admitting: Psychology

## 2018-11-16 ENCOUNTER — Ambulatory Visit (INDEPENDENT_AMBULATORY_CARE_PROVIDER_SITE_OTHER): Payer: Medicare Other | Admitting: Neurology

## 2018-11-16 ENCOUNTER — Encounter: Payer: Self-pay | Admitting: Neurology

## 2018-11-16 ENCOUNTER — Other Ambulatory Visit: Payer: Self-pay

## 2018-11-16 VITALS — BP 115/70 | HR 60 | Temp 97.6°F | Ht 63.0 in | Wt 195.0 lb

## 2018-11-16 DIAGNOSIS — Z9989 Dependence on other enabling machines and devices: Secondary | ICD-10-CM | POA: Diagnosis not present

## 2018-11-16 DIAGNOSIS — G4733 Obstructive sleep apnea (adult) (pediatric): Secondary | ICD-10-CM | POA: Diagnosis not present
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
## 2018-11-16 NOTE — Progress Notes (Signed)
Office: [phone]  /  Fax: [phone]    Date: November 26, 2018   Appointment Start Time: 2:00pm Duration: 26 minutes Provider: Glennie Isle, Psy.D. Type of Session: Individual Therapy  Location of Patient: Home Location of Provider: [facility] Type of Contact: Telepsychological Visit via [employer] Web[employer]   Session Content: Jennifer [name] is a 68 y.o. female presenting via [employer] for a follow-up appointment to address the previously established treatment goal of decreasing emotional eating. Today's appointment was a telepsychological visit, as it is an option for appointments to reduce exposure to COVID-19. Jennifer [name] expressed understanding regarding the rationale for telepsychological services, and provided verbal consent for today's appointment. Prior to proceeding with today's appointment, Jennifer [name]'s physical location at the time of this appointment was obtained. In the event of technical difficulties, Jennifer [name] shared a phone number she could be reached at. Jennifer [name] and this provider participated in today's telepsychological service. Also, Jennifer [name] denied anyone else being present in the room or on the WebEx appointment.  This provider conducted a brief check-in. Jennifer [name] shared about recent events since the last appointment with this provider. She noted she "read the material" about mindfulness and added, "My mind wanders a lot." This was normalized and she was encouraged to continue engaging in mindfulness exercises. In addition, Jennifer [name] shared about her upcoming holiday plans. Learned skills were reviewed and a plan was developed to help Jennifer [name] cope with emotional eating secondary to out of habit and stress in the future. She wrote down the following plan: focus on hydration, be prepared with snacks congruent to the meal plan (especially when traveling), pause to ask questions when triggered to eat (e.g., Am I really hungry?; Is there something bothering me? Will I feel better  if I eat?), and engage learned in coping skills (e.g., five senses exercise) after going through the aforementioned questions. She noted, "I think this is a good way for me. I like it." Jennifer [name] was receptive to today's session as evidenced by openness to sharing, responsiveness to feedback, and willingness to continue engaging in learned skills.  Mental Status Examination:  Appearance: neat Behavior: cooperative Mood: euthymic Affect: mood congruent Speech: normal in rate, volume, and tone Eye Contact: appropriate Psychomotor Activity: appropriate Thought Process: linear, logical, and goal directed  Content/Perceptual Disturbances: no hallucinations, delusions, bizarre thinking or behavior reported or observed and no evidence of suicidal and homicidal ideation, plan, and intent Orientation: time, person, place and purpose of appointment Cognition/Sensorium: memory, attention, language, and fund of knowledge intact  Insight: good Judgment: good  Interventions:  Conducted a brief chart review Provided empathic reflections and validation Employed motivational interviewing skills to assess patient's willingness/desire to adhere to recommended medical treatments and assignments Reviewed learned skills Employed supportive psychotherapy interventions to facilitate reduced distress, and to improve coping skills with identified stressors  DSM-5 Diagnosis: 311 (F32.8) Other Specified Depressive Disorder, Emotional Eating Behaviors  Treatment Goal & Progress: During the initial appointment with this provider, the following treatment goal was established: decrease emotional eating. Jennifer [name] demonstrated progress in her goal as evidenced by increased awareness of hunger patterns and triggers for emotional eating. Jennifer [name] also reported a reduction in emotional eating and continues to demonstrate willingness to engage in learned skill(s).  Plan: Today was Jennifer [name]'s last appointment with this provider as  previously planned. She acknowledged understanding that she may request a follow-up appointment with this provider in the future as long as she is still established with the clinic. No further follow-up planned by this provider.

## 2018-11-16 NOTE — Patient Instructions (Signed)
It was good to see you today! You have been very compliant with your CPAP.  Please keep up the good work, please follow-up routinely in 1 year.

## 2018-11-16 NOTE — Progress Notes (Signed)
Order for cpap supplies sent to [facility] via community message. Confirmation received that the order transmitted was successful.  

## 2018-11-23 ENCOUNTER — Encounter (INDEPENDENT_AMBULATORY_CARE_PROVIDER_SITE_OTHER): Payer: Self-pay | Admitting: Family Medicine

## 2018-11-23 ENCOUNTER — Other Ambulatory Visit: Payer: Self-pay

## 2018-11-23 ENCOUNTER — Ambulatory Visit (INDEPENDENT_AMBULATORY_CARE_PROVIDER_SITE_OTHER): Payer: Medicare Other | Admitting: Family Medicine

## 2018-11-23 VITALS — BP 109/67 | HR 66 | Temp 98.3°F | Ht 63.0 in | Wt 189.0 lb

## 2018-11-23 DIAGNOSIS — E782 Mixed hyperlipidemia: Secondary | ICD-10-CM

## 2018-11-23 DIAGNOSIS — E669 Obesity, unspecified: Secondary | ICD-10-CM | POA: Diagnosis not present

## 2018-11-23 DIAGNOSIS — Z6833 Body mass index (BMI) 33.0-33.9, adult: Secondary | ICD-10-CM | POA: Diagnosis not present

## 2018-11-23 DIAGNOSIS — E559 Vitamin D deficiency, unspecified: Secondary | ICD-10-CM | POA: Diagnosis not present

## 2018-11-23 MED ORDER — VITAMIN D (ERGOCALCIFEROL) 1.25 MG (50000 UNIT) PO CAPS: 50000.0000 [IU] | ORAL_CAPSULE | ORAL | 0 refills | Status: DC

## 2018-11-24 ENCOUNTER — Other Ambulatory Visit (INDEPENDENT_AMBULATORY_CARE_PROVIDER_SITE_OTHER): Payer: Self-pay | Admitting: Family Medicine

## 2018-11-24 DIAGNOSIS — E559 Vitamin D deficiency, unspecified: Secondary | ICD-10-CM

## 2018-11-25 ENCOUNTER — Ambulatory Visit: Payer: Medicare Other | Admitting: Psychology

## 2018-11-25 NOTE — Progress Notes (Signed)
Office: [phone]  /  Fax: [phone]   HPI:   Chief Complaint: OBESITY Jennifer [name] is here to discuss her progress with her obesity treatment plan. She is on the Category 1 plan and is following her eating plan approximately 95 % of the time. She states she is exercising 0 minutes 0 times per week. Jennifer [name] continues to do well with weight loss on her Category 1 plan. Her hunger is controlled, but she will be traveling for Thanksgiving and has questions about how to handle holiday eating.  Her weight is 189 lb (85.7 kg) today and has had a weight loss of 3 pounds over a period of 3 weeks since her last visit. She has lost 19 lbs since starting treatment with [place].  Vitamin D Deficiency Jennifer [name] has a diagnosis of vitamin D deficiency. She is stable on prescription Vit D, but level is not yet at goal. She notes fatigue is improving and denies nausea, vomiting or muscle weakness.  Hyperlipidemia (Mixed) Jennifer [name] has hyperlipidemia and is working to improve her cholesterol levels with intensive lifestyle modification including a low saturated fat diet, exercise and weight loss. She denies any chest pain, claudication or myalgias. She is due for labs soon.  ASSESSMENT AND PLAN:  Vitamin D deficiency - Plan: Vitamin D, Ergocalciferol, (DRISDOL) 1.25 MG (50000 UT) CAPS capsule  Mixed hyperlipidemia  Class 1 obesity with serious comorbidity and body mass index (BMI) of 33.0 to 33.9 in adult, unspecified obesity type  PLAN:  Vitamin D Deficiency Normal was informed that low vitamin D levels contributes to fatigue and are associated with obesity, breast, and colon cancer. Jennifer [name] agrees to continue taking prescription Vit D 50,000 IU every week #4 and we will refill for 1 month. She will follow up for routine testing of vitamin D, at least 2-3 times per year. She was informed of the risk of over-replacement of vitamin D and agrees to not increase her dose unless she discusses this with [place] first.  Jennifer [name] agrees to follow up with our clinic in 3 weeks.  Hyperlipidemia (Mixed) Jennifer [name] was informed of the American Heart Association Guidelines emphasizing intensive lifestyle modifications as the first line treatment for hyperlipidemia. We discussed many lifestyle modifications today in depth, and Jennifer [name] will continue to work on decreasing saturated fats such as fatty red meat, butter and many fried foods. She will also increase vegetables and lean protein in her diet and continue to work on diet, exercise, and weight loss efforts. We will recheck labs at her next visit.  Obesity Jennifer [name] is currently in the action stage of change. As such, her goal is to continue with weight loss efforts She has agreed to follow the Category 1 plan Jennifer [name] has been instructed to work up to a goal of 150 minutes of combined cardio and strengthening exercise per week for weight loss and overall health benefits. We discussed the following Behavioral Modification Strategies today: holiday eating strategies    Jennifer [name] has agreed to follow up with our clinic in 3 weeks. She was informed of the importance of frequent follow up visits to maximize her success with intensive lifestyle modifications for her multiple health conditions.  ALLERGIES: Allergies  Allergen Reactions  . Crestor [Rosuvastatin] Other (See Comments)    Muscle aches  . Neomycin-Bacitracin Zn-Polymyx Swelling    OPTHALMIC EXPOSURE ONLY - - SWELLING EYES   . Neomycin-Bacitracin-Polymyxin [Bacitracin-Neomycin-Polymyxin] Swelling    Reaction to eye drops  . Codeine Rash  . Feldene [Piroxicam] Rash  . Penicillins  Rash    Has patient had a PCN reaction causing immediate rash, facial/tongue/throat swelling, SOB or lightheadedness with hypotension:Yes Has patient had a PCN reaction causing severe rash involving mucus membranes or skin necrosis: No Has patient had a PCN reaction that required hospitalization:No Has patient had a PCN reaction  occurring within the last 10 years:No If all of the above answers are "NO", then may proceed with Cephalosporin use.   . Piroxicam Rash  . Sulfa Antibiotics Rash  . Sulfonamide Derivatives Rash    MEDICATIONS: Current Outpatient Medications on File Prior to Visit  Medication Sig Dispense Refill  . Biotin 1000 MCG tablet Take 1,000 mcg by mouth daily.    Marland Kitchen CALCIUM CARBONATE PO Take 600 mg by mouth daily.     . cetirizine (ZYRTEC) 10 MG tablet Take 10 mg by mouth every evening.     . citalopram (CELEXA) 40 MG tablet TAKE 1 TABLET BY MOUTH EVERY DAY (Patient taking differently: Take 40 mg by mouth daily. ) 90 tablet 1  . clindamycin (CLEOCIN) 300 MG capsule Take 300 mg by mouth. Take 2 hours prior to dental procedure    . hydroxypropyl methylcellulose / hypromellose (ISOPTO TEARS / GONIOVISC) 2.5 % ophthalmic solution Place 1 drop into both eyes 2 (two) times daily as needed for dry eyes.    Marland Kitchen levETIRAcetam (KEPPRA) 500 MG tablet Take 1 tablet (500 mg total) by mouth 2 (two) times daily. 180 tablet 3  . levothyroxine (SYNTHROID) 50 MCG tablet TAKE 1 TABLET BY MOUTH EVERY DAY 90 tablet 3  . losartan (COZAAR) 25 MG tablet Take 2 tablets (50 mg total) by mouth daily. Please make appointment for follow up 180 tablet 0  . MELATONIN PO Take 10 mg by mouth at bedtime.     . Multiple Vitamins-Minerals (MULTIPLE VITAMINS/WOMENS PO) Take 1 tablet by mouth daily.     . pravastatin (PRAVACHOL) 80 MG tablet TAKE 1 TABLET BY MOUTH EVERY DAY 90 tablet 1   No current facility-administered medications on file prior to visit.     PAST MEDICAL HISTORY: Past Medical History:  Diagnosis Date  . Allergy   . Anxiety   . Constipation   . Depression   . Dysrhythmia    FLB - ? PAC'S COMES AND GOES  . Gallbladder problem   . GERD (gastroesophageal reflux disease)   . High cholesterol   . Hx of adenomatous polyp of colon    2012 - [facility]  . Hyperlipidemia   . Hypertension   . Hypothyroid   . Joint pain    . Osteoarthritis   . PLMD (periodic limb movement disorder) 04/24/2012  . Prediabetes   . Seizures ([place])    08/29/13  . Sleep apnea    wears CPAP  . Vitamin D deficiency     PAST SURGICAL HISTORY: Past Surgical History:  Procedure Laterality Date  . APPENDECTOMY    . CESAREAN SECTION    . CHOLECYSTECTOMY  1978  . COLONOSCOPY    . DILATION AND CURETTAGE OF UTERUS    . JOINT REPLACEMENT  1998   right knee  . OSTEOTOMY PROXIMAL FEMORAL    . 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   Procedure: TOTAL HIP ARTHROPLASTY ANTERIOR APPROACH;  Surgeon: Frederik Pear, MD;  Location: [facility];  Service: Orthopedics;  Laterality: Right;  . TOTAL KNEE ARTHROPLASTY Left 07/20/2018   Procedure: Left Knee Arthroplasty;  Surgeon: Frederik Pear, MD;  Location: [facility];  Service: Orthopedics;  Laterality: Left;  SOCIAL HISTORY: Social History   Tobacco Use  . Smoking status: Never Smoker  . Smokeless tobacco: Never Used  Substance Use Topics  . Alcohol use: No    Alcohol/week: 0.0 standard drinks  . Drug use: No    FAMILY HISTORY: Family History  Problem Relation Age of Onset  . Cancer Father 81  . Esophageal cancer Father   . Alcoholism Father   . Heart disease Mother 1       Aortic valve disease  . Diabetes Mother   . Hypertension Mother   . Thyroid disease Mother   . Atrial fibrillation Brother   . Sleep apnea Brother   . Sleep apnea Sister   . Sleep apnea Sister   . Stomach cancer Maternal Grandmother   . Colon cancer Neg Hx   . Rectal cancer Neg Hx     ROS: Review of Systems  Constitutional: Positive for malaise/fatigue and weight loss.  Cardiovascular: Negative for chest pain and claudication.  Gastrointestinal: Negative for nausea and vomiting.  Musculoskeletal: Negative for myalgias.       Negative muscle weakness    PHYSICAL EXAM: Blood pressure 109/67, pulse 66, temperature 98.3 F (36.8 C), temperature source Oral, height 5\' 3"  (1.6 m), weight 189 lb (85.7 kg),  SpO2 96 %. Body mass index is 33.48 kg/m. Physical Exam Vitals signs reviewed.  Constitutional:      Appearance: Normal appearance. She is obese.  Cardiovascular:     Rate and Rhythm: Normal rate.     Pulses: Normal pulses.  Pulmonary:     Effort: Pulmonary effort is normal.     Breath sounds: Normal breath sounds.  Musculoskeletal: Normal range of motion.  Skin:    General: Skin is warm and dry.  Neurological:     Mental Status: She is alert and oriented to person, place, and time.  Psychiatric:        Mood and Affect: Mood normal.        Behavior: Behavior normal.     RECENT LABS AND TESTS: BMET    Component Value Date/Time   NA 139 08/25/2018 1210   K 4.3 08/25/2018 1210   CL 97 08/25/2018 1210   CO2 22 08/25/2018 1210   GLUCOSE 74 08/25/2018 1210   GLUCOSE 138 (H) 07/21/2018 0304   BUN 12 08/25/2018 1210   CREATININE 0.74 08/25/2018 1210   CREATININE 0.72 09/02/2015 1207   CALCIUM 9.9 08/25/2018 1210   GFRNONAA 84 08/25/2018 1210   GFRNONAA 89 09/02/2015 1207   GFRAA 97 08/25/2018 1210   GFRAA >89 09/02/2015 1207   Lab Results  Component Value Date   HGBA1C 5.4 08/25/2018   HGBA1C 5.2 09/16/2017   HGBA1C 5.4 03/20/2017   HGBA1C 5.1 09/25/2016   HGBA1C 5.9 03/13/2016   Lab Results  Component Value Date   INSULIN 14.7 08/25/2018   CBC    Component Value Date/Time   WBC 15.6 (H) 07/21/2018 0304   RBC 3.50 (L) 07/21/2018 0304   HGB 11.0 (L) 07/21/2018 0304   HCT 33.5 (L) 07/21/2018 0304   PLT 276 07/21/2018 0304   MCV 95.7 07/21/2018 0304   MCV 87.4 09/02/2015 1225   MCH 31.4 07/21/2018 0304   MCHC 32.8 07/21/2018 0304   RDW 12.7 07/21/2018 0304   LYMPHSABS 1.2 07/16/2018 1121   MONOABS 0.7 07/16/2018 1121   EOSABS 0.1 07/16/2018 1121   BASOSABS 0.1 07/16/2018 1121   Iron/TIBC/Ferritin/ %Sat No results found for: IRON, TIBC, FERRITIN, IRONPCTSAT Lipid Panel  Component Value Date/Time   CHOL 281 (H) 08/25/2018 1210   TRIG 345 (H)  08/25/2018 1210   HDL 40 08/25/2018 1210   CHOLHDL 5 05/04/2018 0805   VLDL 78.4 (H) 05/04/2018 0805   LDLCALC 172 (H) 08/25/2018 1210   LDLDIRECT 96.0 05/04/2018 0805   Hepatic Function Panel     Component Value Date/Time   PROT 6.9 08/25/2018 1210   ALBUMIN 4.5 08/25/2018 1210   AST 25 08/25/2018 1210   ALT 24 08/25/2018 1210   ALKPHOS 124 (H) 08/25/2018 1210   BILITOT 0.5 08/25/2018 1210      Component Value Date/Time   TSH 2.380 08/25/2018 1210   TSH 2.27 05/04/2018 0805   TSH 2.85 09/16/2017 0918      OBESITY BEHAVIORAL INTERVENTION VISIT  Today's visit was # 7   Starting weight: 208 lbs Starting date: 08/25/2018 Today's weight : 189 lbs Today's date: 11/23/2018 Total lbs lost to date: 19 At least 15 minutes were spent on discussing the following behavioral intervention visit.   ASK: We discussed the diagnosis of obesity with Jennifer [name] today and Jennifer [name] agreed to give [place] permission to discuss obesity behavioral modification therapy today.  ASSESS: Jennifer [name] has the diagnosis of obesity and her BMI today is 33.49 Jennifer [name] is in the action stage of change   ADVISE: Jennifer [name] was educated on the multiple health risks of obesity as well as the benefit of weight loss to improve her health. She was advised of the need for long term treatment and the importance of lifestyle modifications to improve her current health and to decrease her risk of future health problems.  AGREE: Multiple dietary modification options and treatment options were discussed and  Jennifer [name] agreed to follow the recommendations documented in the above note.  ARRANGE: Jennifer [name] was educated on the importance of frequent visits to treat obesity as outlined per CMS and USPSTF guidelines and agreed to schedule her next follow up appointment today.  I, Trixie Dredge, am acting as transcriptionist for Dennard Nip, MD  I have reviewed the above documentation for accuracy and completeness, and  I agree with the above. -Dennard Nip, MD

## 2018-11-26 ENCOUNTER — Other Ambulatory Visit: Payer: Self-pay

## 2018-11-26 ENCOUNTER — Ambulatory Visit (INDEPENDENT_AMBULATORY_CARE_PROVIDER_SITE_OTHER): Payer: Medicare Other | Admitting: Psychology

## 2018-11-26 DIAGNOSIS — Z20828 Contact with and (suspected) exposure to other viral communicable diseases: Secondary | ICD-10-CM | POA: Diagnosis not present

## 2018-11-26 DIAGNOSIS — Z20822 Contact with and (suspected) exposure to covid-19: Secondary | ICD-10-CM

## 2018-11-26 DIAGNOSIS — F3289 Other specified depressive episodes: Secondary | ICD-10-CM | POA: Diagnosis not present

## 2018-11-29 LAB — NOVEL CORONAVIRUS, NAA: SARS-CoV-2, NAA: NOT DETECTED

## 2018-12-09 ENCOUNTER — Ambulatory Visit: Payer: Medicare Other | Admitting: Psychology

## 2018-12-14 ENCOUNTER — Encounter (INDEPENDENT_AMBULATORY_CARE_PROVIDER_SITE_OTHER): Payer: Self-pay | Admitting: Family Medicine

## 2018-12-14 ENCOUNTER — Other Ambulatory Visit (INDEPENDENT_AMBULATORY_CARE_PROVIDER_SITE_OTHER): Payer: Self-pay | Admitting: Family Medicine

## 2018-12-14 ENCOUNTER — Ambulatory Visit (INDEPENDENT_AMBULATORY_CARE_PROVIDER_SITE_OTHER): Payer: Medicare Other | Admitting: Family Medicine

## 2018-12-14 ENCOUNTER — Other Ambulatory Visit: Payer: Self-pay

## 2018-12-14 VITALS — BP 122/74 | HR 63 | Temp 98.3°F | Ht 63.0 in | Wt 184.0 lb

## 2018-12-14 DIAGNOSIS — R739 Hyperglycemia, unspecified: Secondary | ICD-10-CM | POA: Diagnosis not present

## 2018-12-14 DIAGNOSIS — E669 Obesity, unspecified: Secondary | ICD-10-CM | POA: Diagnosis not present

## 2018-12-14 DIAGNOSIS — E559 Vitamin D deficiency, unspecified: Secondary | ICD-10-CM

## 2018-12-14 DIAGNOSIS — E8881 Metabolic syndrome: Secondary | ICD-10-CM | POA: Diagnosis not present

## 2018-12-14 DIAGNOSIS — E039 Hypothyroidism, unspecified: Secondary | ICD-10-CM

## 2018-12-14 DIAGNOSIS — E7849 Other hyperlipidemia: Secondary | ICD-10-CM

## 2018-12-14 DIAGNOSIS — Z6832 Body mass index (BMI) 32.0-32.9, adult: Secondary | ICD-10-CM

## 2018-12-14 MED ORDER — VITAMIN D (ERGOCALCIFEROL) 1.25 MG (50000 UNIT) PO CAPS: 50000.0000 [IU] | ORAL_CAPSULE | ORAL | 0 refills | Status: DC

## 2018-12-15 LAB — LIPID PANEL WITH LDL/HDL RATIO
Cholesterol, Total: 176 mg/dL (ref 100–199)
HDL: 43 mg/dL (ref >39)
LDL Chol Calc (NIH): 104 mg/dL — ABNORMAL HIGH (ref 0–99)
LDL/HDL Ratio: 2.4 ratio (ref 0.0–3.2)
Triglycerides: 163 mg/dL — ABNORMAL HIGH (ref 0–149)
VLDL Cholesterol Cal: 29 mg/dL (ref 5–40)

## 2018-12-15 LAB — COMPREHENSIVE METABOLIC PANEL
ALT: 21 IU/L (ref 0–32)
AST: 20 IU/L (ref 0–40)
Albumin/Globulin Ratio: 1.8 (ref 1.2–2.2)
Albumin: 4.8 g/dL (ref 3.8–4.8)
Alkaline Phosphatase: 123 IU/L — ABNORMAL HIGH (ref 39–117)
BUN/Creatinine Ratio: 26 (ref 12–28)
BUN: 21 mg/dL (ref 8–27)
Bilirubin Total: 0.6 mg/dL (ref 0.0–1.2)
CO2: 24 mmol/L (ref 20–29)
Calcium: 10 mg/dL (ref 8.7–10.3)
Chloride: 100 mmol/L (ref 96–106)
Creatinine, Ser: 0.8 mg/dL (ref 0.57–1.00)
GFR calc Af Amer: 88 mL/min/{1.73_m2} (ref >59)
GFR calc non Af Amer: 77 mL/min/{1.73_m2} (ref >59)
Globulin, Total: 2.7 g/dL (ref 1.5–4.5)
Glucose: 92 mg/dL (ref 65–99)
Potassium: 4.3 mmol/L (ref 3.5–5.2)
Sodium: 138 mmol/L (ref 134–144)
Total Protein: 7.5 g/dL (ref 6.0–8.5)

## 2018-12-15 LAB — HEMOGLOBIN A1C
Est. average glucose Bld gHb Est-mCnc: 108 mg/dL
Hgb A1c MFr Bld: 5.4 % (ref 4.8–5.6)

## 2018-12-15 LAB — INSULIN, RANDOM: INSULIN: 12.8 u[IU]/mL (ref 2.6–24.9)

## 2018-12-15 LAB — TSH: TSH: 2.5 u[IU]/mL (ref 0.450–4.500)

## 2018-12-15 LAB — T4, FREE: Free T4: 1.11 ng/dL (ref 0.82–1.77)

## 2018-12-15 LAB — T3: T3, Total: 79 ng/dL (ref 71–180)

## 2018-12-15 LAB — VITAMIN D 25 HYDROXY (VIT D DEFICIENCY, FRACTURES): Vit D, 25-Hydroxy: 62.8 ng/mL (ref 30.0–100.0)

## 2018-12-15 NOTE — Progress Notes (Signed)
Office: [phone]  /  Fax: [phone]   HPI:   Chief Complaint: OBESITY Jennifer [name] is here to discuss her progress with her obesity treatment plan. She is on the Category 1 plan and is following her eating plan approximately 90 % of the time. She states she is exercising 0 minutes 0 times per week. Jennifer [name] continues to do very well with weight loss, even while traveling. Her hunger is controlled and she did well with portion control over Thanksgiving. She has questions about Christmas eating this year.  Her weight is 184 lb (83.5 kg) today and has had a weight loss of 5 pounds over a period of 3 weeks since her last visit. She has lost 24 lbs since starting treatment with [place].  Vitamin D Deficiency Jennifer [name] has a diagnosis of vitamin D deficiency. She is stable on prescription Vit D. She is due for labs and a refill.  Hypothyroidism Jennifer [name] has a diagnosis of hypothyroidism. She is stable on Synthroid, and she is due for labs.  Insulin Resistance Jennifer [name] has a diagnosis of insulin resistance based on her elevated fasting insulin level >5. Although Jennifer [name]'s blood glucose readings are still under good control, insulin resistance puts her at greater risk of metabolic syndrome and diabetes. She is working on diet and exercise to decrease risk of diabetes, and she is due for labs.  Hyperlipidemia Jennifer [name] has a diagnosis of hyperlipidemia. She is working on her cholesterol levels with intensive lifestyle modification including a low saturated fat diet, exercise and weight loss. She is due for labs.  ASSESSMENT AND PLAN:  Vitamin D deficiency - Plan: Vitamin D (25 hydroxy), Vitamin D, Ergocalciferol, (DRISDOL) 1.25 MG (50000 UT) CAPS capsule  Acquired hypothyroidism - Plan: T3, T4, free, TSH  Insulin resistance - Plan: Comprehensive Metabolic Panel (CMET), HgB A1c, Insulin, random  Other hyperlipidemia - Plan: Lipid Panel With LDL/HDL Ratio  Class 1 obesity with serious comorbidity and  body mass index (BMI) of 32.0 to 32.9 in adult, unspecified obesity type  PLAN:  Vitamin D Deficiency Low vitamin D level contributes to fatigue and are associated with obesity, breast, and colon cancer. Jennifer [name] agrees to continue taking prescription Vit D 50,000 IU every week #4 and we will refill for 1 month. She will follow up for routine testing of vitamin D, at least 2-3 times per year to avoid over-replacement. We will check labs today. Jennifer [name] agrees to follow up with our clinic in 3 weeks.  Hypothyroidism Jennifer [name] was informed of the importance of good thyroid control for overall health and that supertheraputic thyroid levels are dangerous and will not improve weight loss results. We will check labs today and will continue to monitor.  Insulin Resistance Jennifer [name] will continue working on diet, exercise, weight loss, and decreasing simple carbohydrates to help decrease the risk of diabetes. We will check labs today. Jennifer [name] agrees to follow up with [place] as directed to closely monitor her progress.  Hyperlipidemia Intensive lifestyle modifications as the first line treatment for hyperlipidemia. We discussed many lifestyle modifications today and Jennifer [name] will continue to work on diet, exercise and weight loss efforts. We will check labs today.  Obesity Jennifer [name] is currently in the action stage of change. As such, her goal is to continue with weight loss efforts She has agreed to follow the Category 1 plan Jennifer [name] has been instructed to work up to a goal of 150 minutes of combined cardio and strengthening exercise per week for weight loss and overall health benefits. We discussed  the following Behavioral Modification Strategies today: holiday eating strategies    Jennifer [name] has agreed to follow up with our clinic in 3 weeks. She was informed of the importance of frequent follow up visits to maximize her success with intensive lifestyle modifications for her multiple health conditions.   ALLERGIES: Allergies  Allergen Reactions  . Crestor [Rosuvastatin] Other (See Comments)    Muscle aches  . Neomycin-Bacitracin Zn-Polymyx Swelling    OPTHALMIC EXPOSURE ONLY - - SWELLING EYES   . Neomycin-Bacitracin-Polymyxin [Bacitracin-Neomycin-Polymyxin] Swelling    Reaction to eye drops  . Codeine Rash  . Feldene [Piroxicam] Rash  . Penicillins Rash    Has patient had a PCN reaction causing immediate rash, facial/tongue/throat swelling, SOB or lightheadedness with hypotension:Yes Has patient had a PCN reaction causing severe rash involving mucus membranes or skin necrosis: No Has patient had a PCN reaction that required hospitalization:No Has patient had a PCN reaction occurring within the last 10 years:No If all of the above answers are "NO", then may proceed with Cephalosporin use.   . Piroxicam Rash  . Sulfa Antibiotics Rash  . Sulfonamide Derivatives Rash    MEDICATIONS: Current Outpatient Medications on File Prior to Visit  Medication Sig Dispense Refill  . Biotin 1000 MCG tablet Take 1,000 mcg by mouth daily.    Marland Kitchen CALCIUM CARBONATE PO Take 600 mg by mouth daily.     . cetirizine (ZYRTEC) 10 MG tablet Take 10 mg by mouth every evening.     . citalopram (CELEXA) 40 MG tablet TAKE 1 TABLET BY MOUTH EVERY DAY (Patient taking differently: Take 40 mg by mouth daily. ) 90 tablet 1  . clindamycin (CLEOCIN) 300 MG capsule Take 300 mg by mouth. Take 2 hours prior to dental procedure    . hydroxypropyl methylcellulose / hypromellose (ISOPTO TEARS / GONIOVISC) 2.5 % ophthalmic solution Place 1 drop into both eyes 2 (two) times daily as needed for dry eyes.    Marland Kitchen levETIRAcetam (KEPPRA) 500 MG tablet Take 1 tablet (500 mg total) by mouth 2 (two) times daily. 180 tablet 3  . levothyroxine (SYNTHROID) 50 MCG tablet TAKE 1 TABLET BY MOUTH EVERY DAY 90 tablet 3  . losartan (COZAAR) 25 MG tablet Take 2 tablets (50 mg total) by mouth daily. Please make appointment for follow up 180 tablet 0   . MELATONIN PO Take 10 mg by mouth at bedtime.     . Multiple Vitamins-Minerals (MULTIPLE VITAMINS/WOMENS PO) Take 1 tablet by mouth daily.     . pravastatin (PRAVACHOL) 80 MG tablet TAKE 1 TABLET BY MOUTH EVERY DAY 90 tablet 1   No current facility-administered medications on file prior to visit.     PAST MEDICAL HISTORY: Past Medical History:  Diagnosis Date  . Allergy   . Anxiety   . Constipation   . Depression   . Dysrhythmia    FLB - ? PAC'S COMES AND GOES  . Gallbladder problem   . GERD (gastroesophageal reflux disease)   . High cholesterol   . Hx of adenomatous polyp of colon    2012 - [facility]  . Hyperlipidemia   . Hypertension   . Hypothyroid   . Joint pain   . Osteoarthritis   . PLMD (periodic limb movement disorder) 04/24/2012  . Prediabetes   . Seizures ([place])    08/29/13  . Sleep apnea    wears CPAP  . Vitamin D deficiency     PAST SURGICAL HISTORY: Past Surgical History:  Procedure Laterality Date  .  APPENDECTOMY    . CESAREAN SECTION    . CHOLECYSTECTOMY  1978  . COLONOSCOPY    . DILATION AND CURETTAGE OF UTERUS    . JOINT REPLACEMENT  1998   right knee  . OSTEOTOMY PROXIMAL FEMORAL    . 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   Procedure: TOTAL HIP ARTHROPLASTY ANTERIOR APPROACH;  Surgeon: Frederik Pear, MD;  Location: [facility];  Service: Orthopedics;  Laterality: Right;  . TOTAL KNEE ARTHROPLASTY Left 07/20/2018   Procedure: Left Knee Arthroplasty;  Surgeon: Frederik Pear, MD;  Location: [facility];  Service: Orthopedics;  Laterality: Left;    SOCIAL HISTORY: Social History   Tobacco Use  . Smoking status: Never Smoker  . Smokeless tobacco: Never Used  Substance Use Topics  . Alcohol use: No    Alcohol/week: 0.0 standard drinks  . Drug use: No    FAMILY HISTORY: Family History  Problem Relation Age of Onset  . Cancer Father 39  . Esophageal cancer Father   . Alcoholism Father   . Heart disease Mother 29       Aortic valve disease  . Diabetes  Mother   . Hypertension Mother   . Thyroid disease Mother   . Atrial fibrillation Brother   . Sleep apnea Brother   . Sleep apnea Sister   . Sleep apnea Sister   . Stomach cancer Maternal Grandmother   . Colon cancer Neg Hx   . Rectal cancer Neg Hx     ROS: Review of Systems  Constitutional: Positive for weight loss.    PHYSICAL EXAM: Blood pressure 122/74, pulse 63, temperature 98.3 F (36.8 C), temperature source Oral, height 5\' 3"  (1.6 m), weight 184 lb (83.5 kg), SpO2 92 %. Body mass index is 32.59 kg/m. Physical Exam Vitals signs reviewed.  Constitutional:      Appearance: Normal appearance. She is obese.  Cardiovascular:     Rate and Rhythm: Normal rate.     Pulses: Normal pulses.  Pulmonary:     Effort: Pulmonary effort is normal.     Breath sounds: Normal breath sounds.  Musculoskeletal: Normal range of motion.  Skin:    General: Skin is warm and dry.  Neurological:     Mental Status: She is alert and oriented to person, place, and time.  Psychiatric:        Mood and Affect: Mood normal.        Behavior: Behavior normal.     RECENT LABS AND TESTS: BMET    Component Value Date/Time   NA 139 08/25/2018 1210   K 4.3 08/25/2018 1210   CL 97 08/25/2018 1210   CO2 22 08/25/2018 1210   GLUCOSE 74 08/25/2018 1210   GLUCOSE 138 (H) 07/21/2018 0304   BUN 12 08/25/2018 1210   CREATININE 0.74 08/25/2018 1210   CREATININE 0.72 09/02/2015 1207   CALCIUM 9.9 08/25/2018 1210   GFRNONAA 84 08/25/2018 1210   GFRNONAA 89 09/02/2015 1207   GFRAA 97 08/25/2018 1210   GFRAA >89 09/02/2015 1207   Lab Results  Component Value Date   HGBA1C 5.4 08/25/2018   HGBA1C 5.2 09/16/2017   HGBA1C 5.4 03/20/2017   HGBA1C 5.1 09/25/2016   HGBA1C 5.9 03/13/2016   Lab Results  Component Value Date   INSULIN 14.7 08/25/2018   CBC    Component Value Date/Time   WBC 15.6 (H) 07/21/2018 0304   RBC 3.50 (L) 07/21/2018 0304   HGB 11.0 (L) 07/21/2018 0304   HCT 33.5 (L)  07/21/2018 0304   PLT 276 07/21/2018 0304   MCV 95.7 07/21/2018 0304   MCV 87.4 09/02/2015 1225   MCH 31.4 07/21/2018 0304   MCHC 32.8 07/21/2018 0304   RDW 12.7 07/21/2018 0304   LYMPHSABS 1.2 07/16/2018 1121   MONOABS 0.7 07/16/2018 1121   EOSABS 0.1 07/16/2018 1121   BASOSABS 0.1 07/16/2018 1121   Iron/TIBC/Ferritin/ %Sat No results found for: IRON, TIBC, FERRITIN, IRONPCTSAT Lipid Panel     Component Value Date/Time   CHOL 281 (H) 08/25/2018 1210   TRIG 345 (H) 08/25/2018 1210   HDL 40 08/25/2018 1210   CHOLHDL 5 05/04/2018 0805   VLDL 78.4 (H) 05/04/2018 0805   LDLCALC 172 (H) 08/25/2018 1210   LDLDIRECT 96.0 05/04/2018 0805   Hepatic Function Panel     Component Value Date/Time   PROT 6.9 08/25/2018 1210   ALBUMIN 4.5 08/25/2018 1210   AST 25 08/25/2018 1210   ALT 24 08/25/2018 1210   ALKPHOS 124 (H) 08/25/2018 1210   BILITOT 0.5 08/25/2018 1210      Component Value Date/Time   TSH 2.380 08/25/2018 1210   TSH 2.27 05/04/2018 0805   TSH 2.85 09/16/2017 0918      OBESITY BEHAVIORAL INTERVENTION VISIT  Today's visit was # 8   Starting weight: 208 lbs Starting date: 08/25/2018 Today's weight :184 lbs  Today's date: 12/14/2018 Total lbs lost to date: 24 At least 15 minutes were spent on discussing the following behavioral intervention visit.   ASK: We discussed the diagnosis of obesity with Jennifer [name] today and Jennifer [name] agreed to give [place] permission to discuss obesity behavioral modification therapy today.  ASSESS: Jennifer [name] has the diagnosis of obesity and her BMI today is 32.6 Jennifer [name] is in the action stage of change   ADVISE: Jennifer [name] was educated on the multiple health risks of obesity as well as the benefit of weight loss to improve her health. She was advised of the need for long term treatment and the importance of lifestyle modifications to improve her current health and to decrease her risk of future health problems.  AGREE: Multiple  dietary modification options and treatment options were discussed and  Jennifer [name] agreed to follow the recommendations documented in the above note.  ARRANGE: Jennifer [name] was educated on the importance of frequent visits to treat obesity as outlined per CMS and USPSTF guidelines and agreed to schedule her next follow up appointment today.  I, Trixie Dredge, am acting as transcriptionist for Dennard Nip, MD  I have reviewed the above documentation for accuracy and completeness, and I agree with the above. -Dennard Nip, MD

## 2018-12-23 ENCOUNTER — Other Ambulatory Visit: Payer: Self-pay | Admitting: Family Medicine

## 2018-12-23 ENCOUNTER — Ambulatory Visit (INDEPENDENT_AMBULATORY_CARE_PROVIDER_SITE_OTHER): Payer: Medicare Other | Admitting: Psychology

## 2018-12-23 DIAGNOSIS — I10 Essential (primary) hypertension: Secondary | ICD-10-CM

## 2018-12-23 DIAGNOSIS — E119 Type 2 diabetes mellitus without complications: Secondary | ICD-10-CM

## 2018-12-23 DIAGNOSIS — F320 Major depressive disorder, single episode, mild: Secondary | ICD-10-CM

## 2018-12-24 ENCOUNTER — Ambulatory Visit: Payer: Medicare Other | Attending: Internal Medicine

## 2018-12-24 ENCOUNTER — Other Ambulatory Visit: Payer: Self-pay | Admitting: Family Medicine

## 2018-12-24 DIAGNOSIS — F329 Major depressive disorder, single episode, unspecified: Secondary | ICD-10-CM

## 2018-12-24 DIAGNOSIS — Z20822 Contact with and (suspected) exposure to covid-19: Secondary | ICD-10-CM

## 2018-12-25 NOTE — Telephone Encounter (Signed)
Last OV 11/04/2018 Last refilled 07/01/2018 #90 x 1 refill  Please advise, thanks.

## 2018-12-26 LAB — NOVEL CORONAVIRUS, NAA: SARS-CoV-2, NAA: NOT DETECTED

## 2019-01-05 ENCOUNTER — Other Ambulatory Visit (INDEPENDENT_AMBULATORY_CARE_PROVIDER_SITE_OTHER): Payer: Self-pay | Admitting: Family Medicine

## 2019-01-05 DIAGNOSIS — E559 Vitamin D deficiency, unspecified: Secondary | ICD-10-CM

## 2019-01-06 ENCOUNTER — Ambulatory Visit: Payer: Medicare Other | Admitting: Psychology

## 2019-01-11 ENCOUNTER — Telehealth (INDEPENDENT_AMBULATORY_CARE_PROVIDER_SITE_OTHER): Payer: Self-pay | Admitting: Psychology

## 2019-01-11 ENCOUNTER — Encounter (INDEPENDENT_AMBULATORY_CARE_PROVIDER_SITE_OTHER): Payer: Self-pay | Admitting: Family Medicine

## 2019-01-11 ENCOUNTER — Other Ambulatory Visit: Payer: Self-pay

## 2019-01-11 ENCOUNTER — Ambulatory Visit (INDEPENDENT_AMBULATORY_CARE_PROVIDER_SITE_OTHER): Payer: Medicare Other | Admitting: Family Medicine

## 2019-01-11 VITALS — BP 111/68 | HR 68 | Temp 97.6°F | Ht 63.0 in | Wt 180.0 lb

## 2019-01-11 DIAGNOSIS — E669 Obesity, unspecified: Secondary | ICD-10-CM

## 2019-01-11 DIAGNOSIS — E559 Vitamin D deficiency, unspecified: Secondary | ICD-10-CM

## 2019-01-11 DIAGNOSIS — Z6832 Body mass index (BMI) 32.0-32.9, adult: Secondary | ICD-10-CM

## 2019-01-11 DIAGNOSIS — E782 Mixed hyperlipidemia: Secondary | ICD-10-CM | POA: Diagnosis not present

## 2019-01-11 DIAGNOSIS — E8881 Metabolic syndrome: Secondary | ICD-10-CM | POA: Diagnosis not present

## 2019-01-11 MED ORDER — VITAMIN D (ERGOCALCIFEROL) 1.25 MG (50000 UNIT) PO CAPS: 50000.0000 [IU] | ORAL_CAPSULE | ORAL | 0 refills | Status: DC

## 2019-01-11 NOTE — Telephone Encounter (Signed)
  Office: [phone]  /  Fax: [phone]  Date of Call: January 11, 2019  Time of Call: 8:39am Duration of Call: 2.5 minutes Provider: Glennie Isle, PsyD  CONTENT: This provider called Jennifer [name] to thank her for the holiday card she sent this provider. Minelly expressed appreciation for services and shared about the recent holidays. No evidence of suicidal and homicidal ideation, plan, or intent  PLAN: No further follow-up planned by this provider.

## 2019-01-12 NOTE — Progress Notes (Signed)
Chief Complaint: [name] is here to discuss her progress with her obesity treatment plan. Jennifer [name] is on the Category 1 Plan and states she is following her eating plan approximately 90% of the time. Jennifer [name] states she is exercising 0 minutes 0 times per week.  Today's visit was #: 9 Starting weight: 208 lbs Starting date: 08/25/2018  Today's weight: 180 lbs Today's date: 01/11/2019 Total lbs lost to date: 28  Total lbs lost since last in-office visit: 4  Subjective:   Interim History: Jennifer [name] continues to do well with weight loss even over the holidays. Hunger is controlled and she is doing well with meal prep. She has good support from her family.  General review of systems is unchanged or negative.   Assessment/Plan:   1. Insulin resistance. Jennifer [name] had labs [name] at her last visit and would like to review results. She has [name] well with diet and weight loss.  Jennifer [name] was congratulated on her improved labs and will continue diet and weight loss.    2. Vitamin D deficiency. Jennifer [name]'s Vitamin D level has improved and is now at goal. No nausea, vomiting, or muscle weakness.  Jennifer [name] was given a refill on her Vitamin D x1 and agrees to follow-up with our clinic in 3 weeks. We will recheck labs in 3 months.    3. Mixed hyperlipidemia. Jennifer [name]'s HDL, LDL, and triglycerides are all improved with diet. She is tolerating pravastatin well. No chest pain.  Jennifer [name] will continue diet and medications. Will follow.   4. Class 1 obesity with serious comorbidity and body mass index (BMI) of 32.0 to 32.9 in adult, unspecified obesity type    Jennifer [name] is currently in the action stage of change. As such, her goal is to continue with weight loss efforts. She has agreed to Category 1 Plan.   We discussed the following exercise goals today: Jennifer [name] should follow the adult guidelines. When Jennifer [name] cannot meet the adult guidelines, they should be as physically active as their  abilities and conditions will allow.  Jennifer [name] should do exercises that maintain or improve balance if they are at risk of falling. She will start walking and strengthening exercises.  We discussed the following behavioral modification strategies today: dealing with family or coworker sabotage.  Jennifer [name] has agreed to follow-up with our clinic in 3 weeks. She was informed of the importance of frequent follow-up visits to maximize her success with intensive lifestyle modifications for her multiple health conditions.  Objective:   Blood pressure 111/68, pulse 68, temperature 97.6 F (36.4 C), temperature source Oral, height 5\' 3"  (1.6 m), weight 180 lb (81.6 kg), SpO2 98 %. Body mass index is 31.89 kg/m.  General: Cooperative, alert, well developed, in no acute distress. HEENT: Conjunctivae and lids unremarkable. Neck: No thyromegaly.  Cardiovascular: Regular rhythm.  Lungs: Normal work of breathing. Extremities: No edema.  Neurologic: No focal deficits.   Lab Results  Component Value Date   CREATININE 0.80 12/14/2018   BUN 21 12/14/2018   NA 138 12/14/2018   K 4.3 12/14/2018   CL 100 12/14/2018   CO2 24 12/14/2018   Lab Results  Component Value Date   ALT 21 12/14/2018   AST 20 12/14/2018   ALKPHOS 123 (H) 12/14/2018   BILITOT 0.6 12/14/2018   Lab Results  Component Value Date   HGBA1C 5.4 12/14/2018   HGBA1C 5.4 08/25/2018   HGBA1C 5.2 09/16/2017   HGBA1C 5.4 03/20/2017  HGBA1C 5.1 09/25/2016   Lab Results  Component Value Date   INSULIN 12.8 12/14/2018   INSULIN 14.7 08/25/2018   Lab Results  Component Value Date   TSH 2.500 12/14/2018   Lab Results  Component Value Date   CHOL 176 12/14/2018   HDL 43 12/14/2018   LDLCALC 104 (H) 12/14/2018   LDLDIRECT 96.0 05/04/2018   TRIG 163 (H) 12/14/2018   CHOLHDL 5 05/04/2018   Lab Results  Component Value Date   WBC 15.6 (H) 07/21/2018   HGB 11.0 (L) 07/21/2018   HCT 33.5 (L) 07/21/2018    MCV 95.7 07/21/2018   PLT 276 07/21/2018   No results found for: IRON, TIBC, FERRITIN Obesity Behavioral Intervention Visit Documentation for Insurance (15 Minutes):   ASK: We discussed the diagnosis of obesity with Jennifer [name] today and Robby agreed to give [place] permission to discuss obesity behavioral modification therapy today.  ASSESS: [name] has the diagnosis of obesity and her BMI today is 32.0. Jennifer [name] is in the action stage of change.   ADVISE: Jennifer [name] was educated on the multiple health risks of obesity as well as the benefit of weight loss to improve her health. She was advised of the need for long term treatment and the importance of lifestyle modifications to improve her current health and to decrease her risk of future health problems.  AGREE: Multiple dietary modification options and treatment options were discussed and Jennifer [name] agreed to follow the recommendations documented in the above note.  ARRANGE: Jennifer [name] was educated on the importance of frequent visits to treat obesity as outlined per CMS and USPSTF guidelines and agreed to schedule her next follow up appointment today.  Attestation Statements:   Reviewed by clinician on day of visit: allergies, medications, problem list, medical history, surgical history, family history, social history and previous encounter notes.  This visit occurred during the SARS-CoV-2 public health emergency. Safety protocols were in place, including screening questions prior to the visit, additional usage of staff PPE, and extensive cleaning of exam room while observing appropriate contact time as indicated for disinfecting solutions. (CPT [id number])  I, Michaelene Song, am acting as transcriptionist for Dennard Nip, MD  I have reviewed the above documentation for accuracy and completeness, and I agree with the above. -  Dennard Nip, MD

## 2019-01-20 ENCOUNTER — Ambulatory Visit (INDEPENDENT_AMBULATORY_CARE_PROVIDER_SITE_OTHER): Payer: Medicare Other | Admitting: Psychology

## 2019-01-20 DIAGNOSIS — F320 Major depressive disorder, single episode, mild: Secondary | ICD-10-CM | POA: Diagnosis not present

## 2019-02-01 ENCOUNTER — Ambulatory Visit (INDEPENDENT_AMBULATORY_CARE_PROVIDER_SITE_OTHER): Payer: Medicare Other | Admitting: Family Medicine

## 2019-02-01 ENCOUNTER — Encounter (INDEPENDENT_AMBULATORY_CARE_PROVIDER_SITE_OTHER): Payer: Self-pay | Admitting: Family Medicine

## 2019-02-01 ENCOUNTER — Other Ambulatory Visit: Payer: Self-pay

## 2019-02-01 VITALS — BP 109/46 | HR 65 | Temp 98.4°F | Ht 63.0 in | Wt 176.0 lb

## 2019-02-01 DIAGNOSIS — E669 Obesity, unspecified: Secondary | ICD-10-CM | POA: Diagnosis not present

## 2019-02-01 DIAGNOSIS — Z6831 Body mass index (BMI) 31.0-31.9, adult: Secondary | ICD-10-CM

## 2019-02-01 DIAGNOSIS — R5383 Other fatigue: Secondary | ICD-10-CM | POA: Diagnosis not present

## 2019-02-01 DIAGNOSIS — E559 Vitamin D deficiency, unspecified: Secondary | ICD-10-CM | POA: Diagnosis not present

## 2019-02-01 MED ORDER — VITAMIN D (ERGOCALCIFEROL) 1.25 MG (50000 UNIT) PO CAPS: 50000.0000 [IU] | ORAL_CAPSULE | ORAL | 0 refills | Status: DC

## 2019-02-02 NOTE — Progress Notes (Signed)
Chief Complaint:   OBESITY [name] is here to discuss her progress with her obesity treatment plan along with follow-up of her obesity related diagnoses. Jennifer [name] is on the Category 1 Plan and states she is following her eating plan approximately 90% of the time. [name] states she is exercising for 0 minutes 0 times per week.  Today's visit was #: 10 Starting weight: 208 lbs Starting date: 08/25/2018 Today's weight: 176 lbs Today's date: 02/01/2019 Total lbs lost to date: 32 lbs Total lbs lost since last in-office visit: 4 lbs  Interim History: Jennifer [name] continues to do well with weight loss on her Category 1 plan.  Her hunger is mostly controlled and she says she has done will overall with meal planning.  Subjective:   1. Other fatigue Jennifer [name] is taking melatonin OTC 10 mg at bedtime and notes increased daytime fatigue.  Reports falling asleep watching TV for 1-2 hours at a time according to her fitness tracker.  2. Vitamin D deficiency Jennifer [name]'s Vitamin D level was at goal at 62.8 on 12/14/2018. She is currently taking vit D. She denies nausea, vomiting or muscle weakness.  Assessment/Plan:   1. Other fatigue Decrease melatonin OTC to 5 mg at bedtime and follow.  2. Vitamin D deficiency Low Vitamin D level contributes to fatigue and are associated with obesity, breast, and colon cancer. She agrees to continue to take prescription Vitamin D @50 ,000 IU every week and will follow-up for routine testing of Vitamin D, at least 2-3 times per year to avoid over-replacement.  We will recheck her labs in 2 months. - Vitamin D, Ergocalciferol, (DRISDOL) 1.25 MG (50000 UNIT) CAPS capsule; Take 1 capsule (50,000 Units total) by mouth every 7 (seven) days.  Dispense: 4 capsule; Refill: 0  3. Class 1 obesity with serious comorbidity and body mass index (BMI) of 31.0 to 31.9 in adult, unspecified obesity type Jennifer [name] is currently in the action stage of change. As such, her goal is to continue  with weight loss efforts. She has agreed to the Category 1 Plan.   Behavioral modification strategies: meal planning and cooking strategies.  Jennifer [name] has agreed to follow-up with our clinic in 3 weeks. She was informed of the importance of frequent follow-up visits to maximize her success with intensive lifestyle modifications for her multiple health conditions.   Objective:   Blood pressure (!) 109/46, pulse 65, temperature 98.4 F (36.9 C), temperature source Oral, height 5\' 3"  (1.6 m), weight 176 lb (79.8 kg), SpO2 93 %. Body mass index is 31.18 kg/m.  General: Cooperative, alert, well developed, in no acute distress. HEENT: Conjunctivae and lids unremarkable. Cardiovascular: Regular rhythm.  Lungs: Normal work of breathing. Neurologic: No focal deficits.   Lab Results  Component Value Date   CREATININE 0.80 12/14/2018   BUN 21 12/14/2018   NA 138 12/14/2018   K 4.3 12/14/2018   CL 100 12/14/2018   CO2 24 12/14/2018   Lab Results  Component Value Date   ALT 21 12/14/2018   AST 20 12/14/2018   ALKPHOS 123 (H) 12/14/2018   BILITOT 0.6 12/14/2018   Lab Results  Component Value Date   HGBA1C 5.4 12/14/2018   HGBA1C 5.4 08/25/2018   HGBA1C 5.2 09/16/2017   HGBA1C 5.4 03/20/2017   HGBA1C 5.1 09/25/2016   Lab Results  Component Value Date   INSULIN 12.8 12/14/2018   INSULIN 14.7 08/25/2018   Lab Results  Component Value Date   TSH 2.500 12/14/2018   Lab Results  Component Value Date   CHOL 176 12/14/2018   HDL 43 12/14/2018   LDLCALC 104 (H) 12/14/2018   LDLDIRECT 96.0 05/04/2018   TRIG 163 (H) 12/14/2018   CHOLHDL 5 05/04/2018   Lab Results  Component Value Date   WBC 15.6 (H) 07/21/2018   HGB 11.0 (L) 07/21/2018   HCT 33.5 (L) 07/21/2018   MCV 95.7 07/21/2018   PLT 276 07/21/2018   Obesity Behavioral Intervention Documentation for Insurance:   Approximately 15 minutes were spent on the discussion below.  ASK: We discussed the diagnosis of  obesity with Jennifer [name] today and Jennifer [name] agreed to give [place] permission to discuss obesity behavioral modification therapy today.  ASSESS: Jennifer [name] has the diagnosis of obesity and her BMI today is 31.3. Jennifer [name] is in the action stage of change.   ADVISE: Jennifer [name] was educated on the multiple health risks of obesity as well as the benefit of weight loss to improve her health. She was advised of the need for long term treatment and the importance of lifestyle modifications to improve her current health and to decrease her risk of future health problems.  AGREE: Multiple dietary modification options and treatment options were discussed and Jennifer [name] agreed to follow the recommendations documented in the above note.  ARRANGE: Jennifer [name] was educated on the importance of frequent visits to treat obesity as outlined per CMS and USPSTF guidelines and agreed to schedule her next follow up appointment today.  Attestation Statements:   Reviewed by clinician on day of visit: allergies, medications, problem list, medical history, surgical history, family history, social history, and previous encounter notes.  I, Water quality scientist, CMA, am acting as transcriptionist for Dennard Nip, MD.  I have reviewed the above documentation for accuracy and completeness, and I agree with the above. -  Dennard Nip, MD

## 2019-02-03 ENCOUNTER — Ambulatory Visit: Payer: Medicare Other | Admitting: Psychology

## 2019-02-03 ENCOUNTER — Encounter: Payer: Self-pay | Admitting: Family Medicine

## 2019-02-05 ENCOUNTER — Other Ambulatory Visit (INDEPENDENT_AMBULATORY_CARE_PROVIDER_SITE_OTHER): Payer: Self-pay | Admitting: Family Medicine

## 2019-02-05 DIAGNOSIS — E559 Vitamin D deficiency, unspecified: Secondary | ICD-10-CM

## 2019-02-15 ENCOUNTER — Telehealth: Payer: Self-pay

## 2019-02-15 ENCOUNTER — Encounter: Payer: Self-pay | Admitting: Neurology

## 2019-02-15 NOTE — Telephone Encounter (Signed)
Called na answer voice mail left  informed that letter and DMV paper work at front desk for her to pick up

## 2019-02-17 ENCOUNTER — Ambulatory Visit (INDEPENDENT_AMBULATORY_CARE_PROVIDER_SITE_OTHER): Payer: Medicare Other | Admitting: Psychology

## 2019-02-17 DIAGNOSIS — F320 Major depressive disorder, single episode, mild: Secondary | ICD-10-CM | POA: Diagnosis not present

## 2019-02-22 ENCOUNTER — Other Ambulatory Visit: Payer: Self-pay

## 2019-02-22 ENCOUNTER — Ambulatory Visit (INDEPENDENT_AMBULATORY_CARE_PROVIDER_SITE_OTHER): Payer: Medicare Other | Admitting: Family Medicine

## 2019-02-22 ENCOUNTER — Encounter (INDEPENDENT_AMBULATORY_CARE_PROVIDER_SITE_OTHER): Payer: Self-pay | Admitting: Family Medicine

## 2019-02-22 ENCOUNTER — Other Ambulatory Visit (INDEPENDENT_AMBULATORY_CARE_PROVIDER_SITE_OTHER): Payer: Self-pay | Admitting: Family Medicine

## 2019-02-22 VITALS — BP 112/67 | HR 70 | Temp 97.8°F | Ht 63.0 in | Wt 175.0 lb

## 2019-02-22 DIAGNOSIS — I10 Essential (primary) hypertension: Secondary | ICD-10-CM | POA: Diagnosis not present

## 2019-02-22 DIAGNOSIS — E559 Vitamin D deficiency, unspecified: Secondary | ICD-10-CM

## 2019-02-22 DIAGNOSIS — Z6831 Body mass index (BMI) 31.0-31.9, adult: Secondary | ICD-10-CM | POA: Diagnosis not present

## 2019-02-22 DIAGNOSIS — E669 Obesity, unspecified: Secondary | ICD-10-CM

## 2019-02-23 NOTE — Progress Notes (Signed)
Chief Complaint:   OBESITY Jennifer [name] is here to discuss her progress with her obesity treatment plan along with follow-up of her obesity related diagnoses. Jennifer [name] is on the Category 1 Plan and states she is following her eating plan approximately 90% of the time. Jennifer [name] states she is doing 0 minutes 0 times per week.  Today's visit was #: 11 Starting weight: 208 lbs Starting date: 08/25/2018 Today's weight: 175 lbs Today's date: 02/22/2019 Total lbs lost to date: 33 Total lbs lost since last in-office visit: 1  Interim History: Jennifer [name] continues to do well with weight loss on her Category 1 plan. Her is hunger is controlled. She hasn't started exercising yet but she has learned how to use her Roku to watch on her TV, and she is going to start exercise videos.  Subjective:   1. Essential hypertension Jennifer [name]'s blood pressure is stable on her medications and with weight loss. She denies lightheadedness.  Assessment/Plan:   1. Essential hypertension Jennifer [name] will continue her medications, diet, healthy weight loss, and exercise to improve blood pressure control. We will watch for signs of hypotension as she continues her lifestyle modifications. We will continue to monitor.  2. Class 1 obesity with serious comorbidity and body mass index (BMI) of 31.0 to 31.9 in adult, unspecified obesity type Jennifer [name] is currently in the action stage of change. As such, her goal is to continue with weight loss efforts. She has agreed to the Category 1 Plan.   Exercise goals: Jennifer [name] is to do some exercise.  Behavioral modification strategies: increasing water intake.  Jennifer [name] has agreed to follow-up with our clinic in 3 weeks. She was informed of the importance of frequent follow-up visits to maximize her success with intensive lifestyle modifications for her multiple health conditions.   Objective:   Blood pressure 112/67, pulse 70, temperature 97.8 F (36.6 C), temperature source Oral, height  5\' 3"  (1.6 m), weight 175 lb (79.4 kg), SpO2 95 %. Body mass index is 31 kg/m.  General: Cooperative, alert, well developed, in no acute distress. HEENT: Conjunctivae and lids unremarkable. Cardiovascular: Regular rhythm.  Lungs: Normal work of breathing. Neurologic: No focal deficits.   Lab Results  Component Value Date   CREATININE 0.80 12/14/2018   BUN 21 12/14/2018   NA 138 12/14/2018   K 4.3 12/14/2018   CL 100 12/14/2018   CO2 24 12/14/2018   Lab Results  Component Value Date   ALT 21 12/14/2018   AST 20 12/14/2018   ALKPHOS 123 (H) 12/14/2018   BILITOT 0.6 12/14/2018   Lab Results  Component Value Date   HGBA1C 5.4 12/14/2018   HGBA1C 5.4 08/25/2018   HGBA1C 5.2 09/16/2017   HGBA1C 5.4 03/20/2017   HGBA1C 5.1 09/25/2016   Lab Results  Component Value Date   INSULIN 12.8 12/14/2018   INSULIN 14.7 08/25/2018   Lab Results  Component Value Date   TSH 2.500 12/14/2018   Lab Results  Component Value Date   CHOL 176 12/14/2018   HDL 43 12/14/2018   LDLCALC 104 (H) 12/14/2018   LDLDIRECT 96.0 05/04/2018   TRIG 163 (H) 12/14/2018   CHOLHDL 5 05/04/2018   Lab Results  Component Value Date   WBC 15.6 (H) 07/21/2018   HGB 11.0 (L) 07/21/2018   HCT 33.5 (L) 07/21/2018   MCV 95.7 07/21/2018   PLT 276 07/21/2018   No results found for: IRON, TIBC, FERRITIN  Attestation Statements:   Reviewed by clinician on day of visit:  allergies, medications, problem list, medical history, surgical history, family history, social history, and previous encounter notes.  Time spent on visit including pre-visit chart review and post-visit care was 22 minutes.    I, Jennifer [name], am acting as transcriptionist for Dennard Nip, MD.  I have reviewed the above documentation for accuracy and completeness, and I agree with the above. -  Dennard Nip, MD

## 2019-03-01 ENCOUNTER — Encounter: Payer: Self-pay | Admitting: Family Medicine

## 2019-03-01 ENCOUNTER — Other Ambulatory Visit: Payer: Self-pay | Admitting: Family Medicine

## 2019-03-01 DIAGNOSIS — M79674 Pain in right toe(s): Secondary | ICD-10-CM

## 2019-03-01 DIAGNOSIS — M79675 Pain in left toe(s): Secondary | ICD-10-CM

## 2019-03-01 DIAGNOSIS — L819 Disorder of pigmentation, unspecified: Secondary | ICD-10-CM

## 2019-03-03 DIAGNOSIS — L821 Other seborrheic keratosis: Secondary | ICD-10-CM | POA: Diagnosis not present

## 2019-03-03 DIAGNOSIS — L578 Other skin changes due to chronic exposure to nonionizing radiation: Secondary | ICD-10-CM | POA: Diagnosis not present

## 2019-03-03 DIAGNOSIS — D2272 Melanocytic nevi of left lower limb, including hip: Secondary | ICD-10-CM | POA: Diagnosis not present

## 2019-03-03 DIAGNOSIS — B354 Tinea corporis: Secondary | ICD-10-CM | POA: Diagnosis not present

## 2019-03-03 DIAGNOSIS — L814 Other melanin hyperpigmentation: Secondary | ICD-10-CM | POA: Diagnosis not present

## 2019-03-03 DIAGNOSIS — D225 Melanocytic nevi of trunk: Secondary | ICD-10-CM | POA: Diagnosis not present

## 2019-03-05 ENCOUNTER — Encounter: Payer: Self-pay | Admitting: Podiatry

## 2019-03-05 ENCOUNTER — Other Ambulatory Visit: Payer: Self-pay

## 2019-03-05 ENCOUNTER — Ambulatory Visit (INDEPENDENT_AMBULATORY_CARE_PROVIDER_SITE_OTHER): Payer: Medicare Other | Admitting: Podiatry

## 2019-03-05 ENCOUNTER — Ambulatory Visit (INDEPENDENT_AMBULATORY_CARE_PROVIDER_SITE_OTHER): Payer: Medicare Other

## 2019-03-05 DIAGNOSIS — S90423A Blister (nonthermal), unspecified great toe, initial encounter: Secondary | ICD-10-CM | POA: Diagnosis not present

## 2019-03-05 DIAGNOSIS — I7300 Raynaud's syndrome without gangrene: Secondary | ICD-10-CM

## 2019-03-05 DIAGNOSIS — S90421A Blister (nonthermal), right great toe, initial encounter: Secondary | ICD-10-CM

## 2019-03-10 NOTE — Progress Notes (Signed)
Subjective:   Patient ID: Jennifer [name], female   DOB: 69 y.o.   MRN: [id number]   HPI Patient presents stating she started to develop discoloration of the bilateral tips of the great toes bilateral and she bought new shoes in December and it seemed like it all started in December or January.  Does not remember specific injury and states they are sore and states that she is not had drainage but pain.  Patient does not smoke likes to be active   Review of Systems  All other systems reviewed and are negative.       Objective:  Physical Exam Vitals and nursing note reviewed.  Constitutional:      Appearance: She is well-developed.  Pulmonary:     Effort: Pulmonary effort is normal.  Musculoskeletal:        General: Normal range of motion.  Skin:    General: Skin is warm.  Neurological:     Mental Status: She is alert.     Neurovascular status was found to be intact with patient found to have mild coolness of the digits bilateral and has on the hallux bilateral distal thickened tissue formation with no active drainage noted with irritative type tissue formation and obvious trauma to the soft tissue.  No structural changes or damage noted F2     Assessment:  Probability that this is a vascular incident with most likely cold exposure     Plan:  H&P conditions reviewed and I debrided some tissue did not note any breakdown of tissue and I explained Raynaud's phenomena and exposure to cold with possibility for low-grade frostbite.  It should respond over time and she will keep her toes warm and protect them and I gave her strict instructions of any breakdown or necrosis were to occur she is to reappoint immediately  X-rays indicate that there is no signs that there is a bony injury here it appears to be strictly soft tissue

## 2019-03-15 ENCOUNTER — Ambulatory Visit (INDEPENDENT_AMBULATORY_CARE_PROVIDER_SITE_OTHER): Payer: Medicare Other | Admitting: Family Medicine

## 2019-03-15 ENCOUNTER — Other Ambulatory Visit: Payer: Self-pay

## 2019-03-15 ENCOUNTER — Encounter (INDEPENDENT_AMBULATORY_CARE_PROVIDER_SITE_OTHER): Payer: Self-pay | Admitting: Family Medicine

## 2019-03-15 VITALS — BP 125/65 | HR 60 | Temp 98.4°F | Ht 63.0 in | Wt 174.0 lb

## 2019-03-15 DIAGNOSIS — Z6830 Body mass index (BMI) 30.0-30.9, adult: Secondary | ICD-10-CM

## 2019-03-15 DIAGNOSIS — E559 Vitamin D deficiency, unspecified: Secondary | ICD-10-CM | POA: Diagnosis not present

## 2019-03-15 DIAGNOSIS — E66811 Obesity, class 1: Secondary | ICD-10-CM

## 2019-03-15 DIAGNOSIS — E669 Obesity, unspecified: Secondary | ICD-10-CM | POA: Diagnosis not present

## 2019-03-15 MED ORDER — VITAMIN D (ERGOCALCIFEROL) 1.25 MG (50000 UNIT) PO CAPS: 50000.0000 [IU] | ORAL_CAPSULE | ORAL | 0 refills | Status: DC

## 2019-03-16 ENCOUNTER — Encounter (INDEPENDENT_AMBULATORY_CARE_PROVIDER_SITE_OTHER): Payer: Self-pay | Admitting: Family Medicine

## 2019-03-16 NOTE — Progress Notes (Signed)
Chief Complaint:   OBESITY Jennifer [name] is here to discuss her progress with her obesity treatment plan along with follow-up of her obesity related diagnoses. Jennifer [name] is on the Category 1 Plan and states she is following her eating plan approximately 90% of the time. Jennifer [name] states she is doing 0 minutes 0 times per week.  Today's visit was #: 12 Starting weight: 208 lbs Starting date: 08/25/2018 Today's weight: 174 lbs Today's date: 03/15/2019 Total lbs lost to date: 34 Total lbs lost since last in-office visit: 1  Interim History: Jennifer [name] continues to do well with weight loss on her Category 1 plan. Her hunger is controlled and she doesn't feel deprived. She is happy with her plan and weight loss overall.  Subjective:   1. Vitamin D deficiency Jennifer [name] is stable on Vit D, and she denies nausea, vomiting, or muscle weakness. She requests a refill today.  Assessment/Plan:   1. Vitamin D deficiency Low Vitamin D level contributes to fatigue and are associated with obesity, breast, and colon cancer. We will refill prescription Vitamin D for 1 month. Jennifer [name] will follow-up for routine testing of Vitamin D, at least 2-3 times per year to avoid over-replacement.  - Vitamin D, Ergocalciferol, (DRISDOL) 1.25 MG (50000 UNIT) CAPS capsule; Take 1 capsule (50,000 Units total) by mouth every 7 (seven) days.  Dispense: 4 capsule; Refill: 0  2. Class 1 obesity with serious comorbidity and body mass index (BMI) of 30.0 to 30.9 in adult, unspecified obesity type Jennifer [name] is currently in the action stage of change. As such, her goal is to continue with weight loss efforts. She has agreed to the Category 1 Plan.   Behavioral modification strategies: meal planning and cooking strategies.  Jennifer [name] has agreed to follow-up with our clinic in 3 weeks. She was informed of the importance of frequent follow-up visits to maximize her success with intensive lifestyle modifications for her multiple health  conditions.   Objective:   Blood pressure 125/65, pulse 60, temperature 98.4 F (36.9 C), temperature source Oral, height 5\' 3"  (1.6 m), weight 174 lb (78.9 kg), SpO2 95 %. Body mass index is 30.82 kg/m.  General: Cooperative, alert, well developed, in no acute distress. HEENT: Conjunctivae and lids unremarkable. Cardiovascular: Regular rhythm.  Lungs: Normal work of breathing. Neurologic: No focal deficits.   Lab Results  Component Value Date   CREATININE 0.80 12/14/2018   BUN 21 12/14/2018   NA 138 12/14/2018   K 4.3 12/14/2018   CL 100 12/14/2018   CO2 24 12/14/2018   Lab Results  Component Value Date   ALT 21 12/14/2018   AST 20 12/14/2018   ALKPHOS 123 (H) 12/14/2018   BILITOT 0.6 12/14/2018   Lab Results  Component Value Date   HGBA1C 5.4 12/14/2018   HGBA1C 5.4 08/25/2018   HGBA1C 5.2 09/16/2017   HGBA1C 5.4 03/20/2017   HGBA1C 5.1 09/25/2016   Lab Results  Component Value Date   INSULIN 12.8 12/14/2018   INSULIN 14.7 08/25/2018   Lab Results  Component Value Date   TSH 2.500 12/14/2018   Lab Results  Component Value Date   CHOL 176 12/14/2018   HDL 43 12/14/2018   LDLCALC 104 (H) 12/14/2018   LDLDIRECT 96.0 05/04/2018   TRIG 163 (H) 12/14/2018   CHOLHDL 5 05/04/2018   Lab Results  Component Value Date   WBC 15.6 (H) 07/21/2018   HGB 11.0 (L) 07/21/2018   HCT 33.5 (L) 07/21/2018   MCV 95.7 07/21/2018  PLT 276 07/21/2018   No results found for: IRON, TIBC, FERRITIN  Obesity Behavioral Intervention Documentation for Insurance:   Approximately 15 minutes were spent on the discussion below.  ASK: We discussed the diagnosis of obesity with Jennifer [name] today and Jennifer [name] agreed to give [place] permission to discuss obesity behavioral modification therapy today.  ASSESS: Jennifer [name] has the diagnosis of obesity and her BMI today is 30.83. Jennifer [name] is in the action stage of change.   ADVISE: Jennifer [name] was educated on the multiple health risks of obesity as  well as the benefit of weight loss to improve her health. She was advised of the need for long term treatment and the importance of lifestyle modifications to improve her current health and to decrease her risk of future health problems.  AGREE: Multiple dietary modification options and treatment options were discussed and Jennifer [name] agreed to follow the recommendations documented in the above note.  ARRANGE: Jennifer [name] was educated on the importance of frequent visits to treat obesity as outlined per CMS and USPSTF guidelines and agreed to schedule her next follow up appointment today.  Attestation Statements:   Reviewed by clinician on day of visit: allergies, medications, problem list, medical history, surgical history, family history, social history, and previous encounter notes.   I, Trixie Dredge, am acting as transcriptionist for Dennard Nip, MD.  I have reviewed the above documentation for accuracy and completeness, and I agree with the above. -  Dennard Nip, MD

## 2019-03-17 ENCOUNTER — Ambulatory Visit (INDEPENDENT_AMBULATORY_CARE_PROVIDER_SITE_OTHER): Payer: Medicare Other | Admitting: Psychology

## 2019-03-17 DIAGNOSIS — F320 Major depressive disorder, single episode, mild: Secondary | ICD-10-CM | POA: Diagnosis not present

## 2019-03-17 NOTE — Telephone Encounter (Signed)
Please advise 

## 2019-03-22 ENCOUNTER — Encounter: Payer: Self-pay | Admitting: Internal Medicine

## 2019-03-31 ENCOUNTER — Ambulatory Visit: Payer: Medicare Other | Admitting: Psychology

## 2019-04-06 ENCOUNTER — Other Ambulatory Visit: Payer: Self-pay

## 2019-04-06 ENCOUNTER — Encounter (INDEPENDENT_AMBULATORY_CARE_PROVIDER_SITE_OTHER): Payer: Self-pay | Admitting: Family Medicine

## 2019-04-06 ENCOUNTER — Ambulatory Visit (INDEPENDENT_AMBULATORY_CARE_PROVIDER_SITE_OTHER): Payer: Medicare Other | Admitting: Family Medicine

## 2019-04-06 VITALS — BP 133/75 | HR 63 | Temp 98.5°F | Ht 63.0 in | Wt 170.0 lb

## 2019-04-06 DIAGNOSIS — E669 Obesity, unspecified: Secondary | ICD-10-CM | POA: Diagnosis not present

## 2019-04-06 DIAGNOSIS — Z6830 Body mass index (BMI) 30.0-30.9, adult: Secondary | ICD-10-CM

## 2019-04-06 DIAGNOSIS — I10 Essential (primary) hypertension: Secondary | ICD-10-CM

## 2019-04-06 DIAGNOSIS — E559 Vitamin D deficiency, unspecified: Secondary | ICD-10-CM

## 2019-04-06 MED ORDER — VITAMIN D (ERGOCALCIFEROL) 1.25 MG (50000 UNIT) PO CAPS: 50000.0000 [IU] | ORAL_CAPSULE | ORAL | 0 refills | Status: DC

## 2019-04-06 NOTE — Progress Notes (Signed)
Chief Complaint:   OBESITY Jennifer [name] is here to discuss her progress with her obesity treatment plan along with follow-up of her obesity related diagnoses. Jennifer [name] is on the Category 1 Plan and states she is following her eating plan approximately 90% of the time. Jennifer [name] states she is doing [employer] walking on YouTube for 20-30 minutes 5 times per week.  Today's visit was #: 45 Starting weight: 208 lbs Starting date: 08/25/2018 Today's weight: 170 lbs Today's date: 04/06/2019 Total lbs lost to date: 38 Total lbs lost since last in-office visit: 4  Interim History: Jennifer [name] is wondering if she can enjoy some Ham over the weekend. I advised that it is okay to enjoy, just increase her water intake to eliminate the sodium from her holiday meal. She has been exercising with YouTube videos. She enjoys "the higher beat versions". She will be traveling to [place] for Easter holiday.  Subjective:   1. Vitamin D deficiency Jennifer [name]'s last Vit D level was 62.8 on 12/14/2018. She is currently on prescription strength Vit D supplementation.  2. Essential hypertension Jennifer [name]'s blood pressure is well controlled on losartan 25 mg 2 tablets q daily. I encouraged her to increase her wter intake with higher sodium foods.  Assessment/Plan:   1. Vitamin D deficiency Low Vitamin D level contributes to fatigue and are associated with obesity, breast, and colon cancer. We will refill prescription Vitamin D for 1 month. Jennifer [name] will follow-up for routine testing of Vitamin D, at least 2-3 times per year to avoid over-replacement.  - Vitamin D, Ergocalciferol, (DRISDOL) 1.25 MG (50000 UNIT) CAPS capsule; Take 1 capsule (50,000 Units total) by mouth every 7 (seven) days.  Dispense: 4 capsule; Refill: 0  2. Essential hypertension Jennifer [name] will continue her Category 1 meal plan, and will continue to work on healthy weight loss and exercise to improve blood pressure control. We will watch for signs of  hypotension as she continues her lifestyle modifications. Jennifer [name] will continue her current ARB therapy.  3. Class 1 obesity with serious comorbidity and body mass index (BMI) of 30.0 to 30.9 in adult, unspecified obesity type Jennifer [name] is currently in the action stage of change. As such, her goal is to continue with weight loss efforts. She has agreed to the Category 1 Plan.   Exercise goals: As is.  Behavioral modification strategies: increasing water intake and holiday eating strategies .  Jennifer [name] has agreed to follow-up with our clinic in 4 weeks. She was informed of the importance of frequent follow-up visits to maximize her success with intensive lifestyle modifications for her multiple health conditions.   Objective:   Blood pressure 133/75, pulse 63, temperature 98.5 F (36.9 C), temperature source Oral, height 5\' 3"  (1.6 m), weight 170 lb (77.1 kg), SpO2 97 %. Body mass index is 30.11 kg/m.  General: Cooperative, alert, well developed, in no acute distress. HEENT: Conjunctivae and lids unremarkable. Cardiovascular: Regular rhythm.  Lungs: Normal work of breathing. Neurologic: No focal deficits.   Lab Results  Component Value Date   CREATININE 0.80 12/14/2018   BUN 21 12/14/2018   NA 138 12/14/2018   K 4.3 12/14/2018   CL 100 12/14/2018   CO2 24 12/14/2018   Lab Results  Component Value Date   ALT 21 12/14/2018   AST 20 12/14/2018   ALKPHOS 123 (H) 12/14/2018   BILITOT 0.6 12/14/2018   Lab Results  Component Value Date   HGBA1C 5.4 12/14/2018   HGBA1C 5.4 08/25/2018   HGBA1C 5.2  09/16/2017   HGBA1C 5.4 03/20/2017   HGBA1C 5.1 09/25/2016   Lab Results  Component Value Date   INSULIN 12.8 12/14/2018   INSULIN 14.7 08/25/2018   Lab Results  Component Value Date   TSH 2.500 12/14/2018   Lab Results  Component Value Date   CHOL 176 12/14/2018   HDL 43 12/14/2018   LDLCALC 104 (H) 12/14/2018   LDLDIRECT 96.0 05/04/2018   TRIG 163 (H) 12/14/2018    CHOLHDL 5 05/04/2018   Lab Results  Component Value Date   WBC 15.6 (H) 07/21/2018   HGB 11.0 (L) 07/21/2018   HCT 33.5 (L) 07/21/2018   MCV 95.7 07/21/2018   PLT 276 07/21/2018   No results found for: IRON, TIBC, FERRITIN  Obesity Behavioral Intervention Documentation for Insurance:   Approximately 15 minutes were spent on the discussion below.  ASK: We discussed the diagnosis of obesity with Jennifer [name] today and Jennifer [name] agreed to give [place] permission to discuss obesity behavioral modification therapy today.  ASSESS: Jennifer [name] has the diagnosis of obesity and her BMI today is 30.12. Jennifer [name] is in the action stage of change.   ADVISE: Jennifer [name] was educated on the multiple health risks of obesity as well as the benefit of weight loss to improve her health. She was advised of the need for long term treatment and the importance of lifestyle modifications to improve her current health and to decrease her risk of future health problems.  AGREE: Multiple dietary modification options and treatment options were discussed and Jennifer [name] agreed to follow the recommendations documented in the above note.  ARRANGE: Jennifer [name] was educated on the importance of frequent visits to treat obesity as outlined per CMS and USPSTF guidelines and agreed to schedule her next follow up appointment today.  Attestation Statements:   Reviewed by clinician on day of visit: allergies, medications, problem list, medical history, surgical history, family history, social history, and previous encounter notes.   I, Trixie Dredge, am acting as transcriptionist for Dennard Nip, MD.  I have reviewed the above documentation for accuracy and completeness, and I agree with the above. -  Dennard Nip, MD

## 2019-04-07 ENCOUNTER — Other Ambulatory Visit (INDEPENDENT_AMBULATORY_CARE_PROVIDER_SITE_OTHER): Payer: Self-pay | Admitting: Family Medicine

## 2019-04-07 DIAGNOSIS — E559 Vitamin D deficiency, unspecified: Secondary | ICD-10-CM

## 2019-04-14 ENCOUNTER — Ambulatory Visit (INDEPENDENT_AMBULATORY_CARE_PROVIDER_SITE_OTHER): Payer: Medicare Other | Admitting: Psychology

## 2019-04-14 DIAGNOSIS — F320 Major depressive disorder, single episode, mild: Secondary | ICD-10-CM | POA: Diagnosis not present

## 2019-04-15 ENCOUNTER — Ambulatory Visit (AMBULATORY_SURGERY_CENTER): Payer: Self-pay | Admitting: *Deleted

## 2019-04-15 ENCOUNTER — Other Ambulatory Visit: Payer: Self-pay

## 2019-04-15 VITALS — Ht 63.0 in | Wt 176.6 lb

## 2019-04-15 DIAGNOSIS — Z86010 Personal history of colonic polyps: Secondary | ICD-10-CM

## 2019-04-15 NOTE — Progress Notes (Signed)
Patient denies any allergies to egg or soy products. Patient denies complications with anesthesia/sedation.  Dx with OSA, wears CPAP nightly. Patient denies oxygen use at home and denies diet medications. Emmi instructions for colonoscopy explained and given to patient.  Patient had both covid vaccinations, last one on 03/10/19.  No covid test needed.

## 2019-04-19 ENCOUNTER — Other Ambulatory Visit: Payer: Self-pay

## 2019-04-19 ENCOUNTER — Ambulatory Visit (INDEPENDENT_AMBULATORY_CARE_PROVIDER_SITE_OTHER): Payer: Medicare Other

## 2019-04-19 VITALS — BP 133/75 | Wt 170.0 lb

## 2019-04-19 DIAGNOSIS — Z0000 Encounter for general adult medical examination without abnormal findings: Secondary | ICD-10-CM | POA: Diagnosis not present

## 2019-04-19 NOTE — Progress Notes (Signed)
Subjective:   Jennifer [name] is a 69 y.o. female who presents for an Initial Medicare Annual Wellness Visit.  Review of Systems: N/A      This visit is being conducted through telemedicine via telephone at the nurse health advisor's home address due to the COVID-19 pandemic. This patient has given me verbal consent via doximity to conduct this visit, patient states they are participating from their home address. Patient and myself are on the telephone call. There is no referral for this visit. Some vital signs may be absent or patient reported.    Patient identification: identified by name, DOB, and current address   Cardiac Risk Factors include: advanced age (>53men, >1 women);hypertension;dyslipidemia     Objective:    Today's Vitals   04/19/19 1155  BP: 133/75  Weight: 170 lb (77.1 kg)   Body mass index is 30.11 kg/m.  Advanced Directives 04/19/2019 07/20/2018 07/20/2018 07/16/2018 10/09/2015 03/15/2014 08/30/2013  Does Patient Have a Medical Advance Directive? Yes No No No Yes No No  Type of Paramedic of [place];Living will - - - - - -  Copy of [place] in Chart? Yes - validated most recent copy scanned in chart (See row information) - - - - - -  Would patient like information on creating a medical advance directive? - No - Patient declined - No - Patient declined - No - patient declined information No - patient declined information    Current Medications (verified) Outpatient Encounter Medications as of 04/19/2019  Medication Sig  . Biotin 1000 MCG tablet Take 1,000 mcg by mouth daily.  . bisacodyl (BISACODYL) 5 MG EC tablet Take 5 mg by mouth daily as needed for moderate constipation. Dulcolax 5 mg tab take as directed for colonoscopy prep.  Marland Kitchen CALCIUM CARBONATE PO Take 600 mg by mouth daily.   . cetirizine (ZYRTEC) 10 MG tablet Take 10 mg by mouth every evening.   . citalopram (CELEXA) 40 MG tablet TAKE 1 TABLET BY MOUTH EVERY  DAY  . clindamycin (CLEOCIN) 300 MG capsule Take 300 mg by mouth. Take 2 hours prior to dental procedure  . hydroxypropyl methylcellulose / hypromellose (ISOPTO TEARS / GONIOVISC) 2.5 % ophthalmic solution Place 1 drop into both eyes 2 (two) times daily as needed for dry eyes.  Marland Kitchen ketoconazole (NIZORAL) 2 % cream Apply 1 application topically daily.  Marland Kitchen levETIRAcetam (KEPPRA) 500 MG tablet Take 1 tablet (500 mg total) by mouth 2 (two) times daily.  Marland Kitchen levothyroxine (SYNTHROID) 50 MCG tablet TAKE 1 TABLET BY MOUTH EVERY DAY  . losartan (COZAAR) 25 MG tablet TAKE 2 TABLETS (50 MG TOTAL) BY MOUTH DAILY. PLEASE MAKE APPOINTMENT FOR FOLLOW UP  . MELATONIN PO Take 5 mg by mouth at bedtime.   . Multiple Vitamins-Minerals (MULTIPLE VITAMINS/WOMENS PO) Take 1 tablet by mouth daily.   . Polyethylene Glycol 3350 (MIRALAX PO) Take by mouth. Miralax 238 and 119 grams as directed for colonoscopy prep.  . pravastatin (PRAVACHOL) 80 MG tablet TAKE 1 TABLET BY MOUTH EVERY DAY  . Vitamin D, Ergocalciferol, (DRISDOL) 1.25 MG (50000 UNIT) CAPS capsule Take 1 capsule (50,000 Units total) by mouth every 7 (seven) days.   No facility-administered encounter medications on file as of 04/19/2019.    Allergies (verified) Crestor [rosuvastatin], Neomycin-bacitracin zn-polymyx, Neomycin-bacitracin-polymyxin [bacitracin-neomycin-polymyxin], Codeine, Feldene [piroxicam], Penicillins, Piroxicam, Sulfa antibiotics, and Sulfonamide derivatives   History: Past Medical History:  Diagnosis Date  . Allergy   . Anxiety   .  Constipation   . Depression   . Dysrhythmia    FLB - ? PAC'S COMES AND GOES  . Gallbladder problem   . GERD (gastroesophageal reflux disease)   . High cholesterol   . Hx of adenomatous polyp of colon    2012 - [place]  . Hyperlipidemia   . Hypertension   . Hypothyroid   . Joint pain    no current problems  . Osteoarthritis   . PLMD (periodic limb movement disorder) 04/24/2012  . Prediabetes   .  Seizures ([place]) 08/29/2013   only 1 seizure episode  . Sleep apnea    wears CPAP nightly  . Vitamin D deficiency    Past Surgical History:  Procedure Laterality Date  . APPENDECTOMY    . CESAREAN SECTION      x1  . CHOLECYSTECTOMY  1978  . COLONOSCOPY    . DILATION AND CURETTAGE OF UTERUS    . JOINT REPLACEMENT  1998   right knee  . OSTEOTOMY PROXIMAL FEMORAL    . 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   Procedure: TOTAL HIP ARTHROPLASTY ANTERIOR APPROACH;  Surgeon: Frederik Pear, MD;  Location: [facility];  Service: Orthopedics;  Laterality: Right;  . TOTAL KNEE ARTHROPLASTY Left 07/20/2018   Procedure: Left Knee Arthroplasty;  Surgeon: Frederik Pear, MD;  Location: [facility];  Service: Orthopedics;  Laterality: Left;   Family History  Problem Relation Age of Onset  . Cancer Father 43  . Esophageal cancer Father   . Alcoholism Father   . Heart disease Mother 78       Aortic valve disease  . Diabetes Mother   . Hypertension Mother   . Thyroid disease Mother   . Atrial fibrillation Brother   . Sleep apnea Brother   . Sleep apnea Sister   . Sleep apnea Sister   . Stomach cancer Maternal Grandmother   . Colon cancer Neg Hx   . Rectal cancer Neg Hx    Social History   Socioeconomic History  . Marital status: Divorced    Spouse name: Not on file  . Number of children: 1  . Years of education: Not on file  . Highest education level: Not on file  Occupational History  . Occupation: Retired    Fish farm manager: [employer]  Tobacco Use  . Smoking status: Never Smoker  . Smokeless tobacco: Never Used  Substance and Sexual Activity  . Alcohol use: No    Alcohol/week: 0.0 standard drinks  . Drug use: No  . Sexual activity: Never    Birth control/protection: Post-menopausal  Other Topics Concern  . Not on file  Social History Narrative   Lives with blind dog and cat.    Social Determinants of Health   Financial Resource Strain: Low Risk   . Difficulty of Paying Living Expenses:  Not hard at all  Food Insecurity: No Food Insecurity  . Worried About Charity fundraiser in the Last Year: Never true  . Ran Out of Food in the Last Year: Never true  Transportation Needs: No Transportation Needs  . Lack of Transportation (Medical): No  . Lack of Transportation (Non-Medical): No  Physical Activity: Sufficiently Active  . Days of Exercise per Week: 5 days  . Minutes of Exercise per Session: 30 min  Stress: No Stress Concern Present  . Feeling of Stress : Not at all  Social Connections:   . Frequency of Communication with Friends and Family:   . Frequency of Social Gatherings with Friends  and Family:   . Attends Religious Services:   . Active Member of Clubs or Organizations:   . Attends Archivist Meetings:   Marland Kitchen Marital Status:     Tobacco Counseling Counseling given: Not Answered   Clinical Intake:  Pre-visit preparation completed: Yes  Pain : No/denies pain     Nutritional Risks: None Diabetes: No  How often do you need to have someone help you when you read instructions, pamphlets, or other written materials from your doctor or pharmacy?: 1 - Never What is the last grade level you completed in school?: 2 years of college  Interpreter Needed?: No  Information entered by :: CJohnson, LPN   Activities of Daily Living In your present state of health, do you have any difficulty performing the following activities: 04/19/2019 07/20/2018  Hearing? N N  Vision? N N  Difficulty concentrating or making decisions? N N  Walking or climbing stairs? N Y  Dressing or bathing? N N  Doing errands, shopping? N N  Preparing Food and eating ? N -  Using the Toilet? N -  In the past six months, have you accidently leaked urine? N -  Do you have problems with loss of bowel control? N -  Managing your Medications? N -  Managing your Finances? N -  Housekeeping or managing your Housekeeping? N -  Some recent data might be hidden     Immunizations and  Health Maintenance Immunization History  Administered Date(s) Administered  . Influenza, High Dose Seasonal PF 10/08/2016, 09/17/2017, 09/08/2018  . Influenza,inj,Quad PF,6+ Mos 09/02/2015  . Influenza-Unspecified 11/06/2013  . Pneumococcal Conjugate-13 03/08/2016  . Tdap 06/18/2011   Health Maintenance Due  Topic Date Due  . PNA vac Low Risk Adult (2 of 2 - PPSV23) 03/08/2017    Patient Care Team: Elby Beck, FNP as PCP - General (Nurse Practitioner)  Indicate any recent Medical Services you may have received from other than [facility] providers in the past year (date may be approximate).     Assessment:   This is a routine wellness examination for Jennifer [name].  Hearing/Vision screen  Hearing Screening   125Hz  250Hz  500Hz  1000Hz  2000Hz  3000Hz  4000Hz  6000Hz  8000Hz   Right ear:           Left ear:           Vision Screening Comments: Patient gets annual eye exams.  Dietary issues and exercise activities discussed: Current Exercise Habits: Home exercise routine, Type of exercise: walking, Time (Minutes): 30, Frequency (Times/Week): 5, Weekly Exercise (Minutes/Week): 150, Intensity: Moderate, Exercise limited by: None identified  Goals    . Patient Stated     04/19/2019, I will continue to do my exercise tapes 4-5 days a week for about 1-2 miles at a time.       Depression Screen PHQ 2/9 Scores 04/19/2019 08/25/2018 05/06/2018 12/17/2017 09/25/2016 09/02/2015 10/17/2014  PHQ - 2 Score 2 3 0 4 0 0 0  PHQ- 9 Score 2 11 3 13  0 - -  Some encounter information is confidential and restricted. Go to Review Flowsheets activity to see all data.    Fall Risk Fall Risk  04/19/2019 08/24/2018 09/03/2017 08/04/2017 09/25/2016  Falls in the past year? 0 1 No No No  Number falls in past yr: 0 0 - - -  Injury with Fall? 0 0 - - -  Risk for fall due to : Medication side effect Impaired balance/gait - - -  Follow up Falls evaluation completed;Falls prevention  discussed Falls evaluation completed - -  -  Some encounter information is confidential and restricted. Go to Review Flowsheets activity to see all data.    Is the patient's home free of loose throw rugs in walkways, pet beds, electrical cords, etc?   yes      Grab bars in the bathroom? no      Handrails on the stairs?   yes      Adequate lighting?   yes  Timed Get Up and Go Performed: N/A  Cognitive Function: MMSE - Mini Mental State Exam 04/19/2019  Orientation to time 5  Orientation to Place 5  Registration 3  Attention/ Calculation 5  Recall 3  Language- repeat 1  Mini Cog  Mini-Cog screen was completed. Maximum score is 22. A value of 0 denotes this part of the MMSE was not completed or the patient failed this part of the Mini-Cog screening.'      Screening Tests Health Maintenance  Topic Date Due  . PNA vac Low Risk Adult (2 of 2 - PPSV23) 03/08/2017  . COLONOSCOPY  05/09/2019  . INFLUENZA VACCINE  08/08/2019  . MAMMOGRAM  08/05/2020  . TETANUS/TDAP  06/17/2021  . DEXA SCAN  Completed  . Hepatitis C Screening  Completed    Qualifies for Shingles Vaccine: Yes   Cancer Screenings: Lung: Low Dose CT Chest recommended if Age 21-80 years, 30 pack-year currently smoking OR have quit w/in 15 years. Patient does not qualify. Breast: Up to date on Mammogram: Yes, completed 08/06/2018   Bone Density/Dexa: completed 03/29/2016, scheduled for 08/09/2019 per patient Colorectal: completed 05/09/2014, scheduled for 04/2019 per patient  Additional Screenings:  Hepatitis C Screening: 09/02/2015     Plan:    Patient will continue to do her exercise tapes 4-5 days a week for about 1-2 miles at a time.   I have personally reviewed and noted the following in the patient's chart:   . Medical and social history . Use of alcohol, tobacco or illicit drugs  . Current medications and supplements . Functional ability and status . Nutritional status . Physical activity . Advanced directives . List of other  physicians . Hospitalizations, surgeries, and ER visits in previous 12 months . Vitals . Screenings to include cognitive, depression, and falls . Referrals and appointments  In addition, I have reviewed and discussed with patient certain preventive protocols, quality metrics, and best practice recommendations. A written personalized care plan for preventive services as well as general preventive health recommendations were provided to patient.     Andrez Grime, LPN   [id number]

## 2019-04-19 NOTE — Patient Instructions (Signed)
Jennifer [name] , Thank you for taking time to come for your Medicare Wellness Visit. I appreciate your ongoing commitment to your health goals. Please review the following plan we discussed and let me know if I can assist you in the future.   Screening recommendations/referrals: Colonoscopy: Up to date, completed 05/09/2014 Mammogram: Up to date, completed 7/30/20020 Bone Density: completed 03/29/2016 Recommended yearly ophthalmology/optometry visit for glaucoma screening and checkup Recommended yearly dental visit for hygiene and checkup  Vaccinations: Influenza vaccine: Up to date, completed 09/08/2018 Pneumococcal vaccine: due Tdap vaccine: Up to date, completed 06/18/2011 Shingles vaccine: discussed    Advanced directives: Please bring a copy of your POA (Power of Andres) and/or Living Will to your next appointment.   Conditions/risks identified: hypertension, hyperlipidemia  Next appointment: 04/23/2019 @ 3 pm    Preventive Care 65 Years and Older, Female Preventive care refers to lifestyle choices and visits with your health care provider that can promote health and wellness. What does preventive care include?  A yearly physical exam. This is also called an annual well check.  Dental exams once or twice a year.  Routine eye exams. Ask your health care provider how often you should have your eyes checked.  Personal lifestyle choices, including:  Daily care of your teeth and gums.  Regular physical activity.  Eating a healthy diet.  Avoiding tobacco and drug use.  Limiting alcohol use.  Practicing safe sex.  Taking low-dose aspirin every day.  Taking vitamin and mineral supplements as recommended by your health care provider. What happens during an annual well check? The services and screenings done by your health care provider during your annual well check will depend on your age, overall health, lifestyle risk factors, and family history of disease. Counseling    Your health care provider may ask you questions about your:  Alcohol use.  Tobacco use.  Drug use.  Emotional well-being.  Home and relationship well-being.  Sexual activity.  Eating habits.  History of falls.  Memory and ability to understand (cognition).  Work and work Statistician.  Reproductive health. Screening  You may have the following tests or measurements:  Height, weight, and BMI.  Blood pressure.  Lipid and cholesterol levels. These may be checked every 5 years, or more frequently if you are over 20 years old.  Skin check.  Lung cancer screening. You may have this screening every year starting at age 41 if you have a 30-pack-year history of smoking and currently smoke or have quit within the past 15 years.  Fecal occult blood test (FOBT) of the stool. You may have this test every year starting at age 62.  Flexible sigmoidoscopy or colonoscopy. You may have a sigmoidoscopy every 5 years or a colonoscopy every 10 years starting at age 16.  Hepatitis C blood test.  Hepatitis B blood test.  Sexually transmitted disease (STD) testing.  Diabetes screening. This is done by checking your blood sugar (glucose) after you have not eaten for a while (fasting). You may have this done every 1-3 years.  Bone density scan. This is done to screen for osteoporosis. You may have this done starting at age 46.  Mammogram. This may be done every 1-2 years. Talk to your health care provider about how often you should have regular mammograms. Talk with your health care provider about your test results, treatment options, and if necessary, the need for more tests. Vaccines  Your health care provider may recommend certain vaccines, such as:  Influenza vaccine.  This is recommended every year.  Tetanus, diphtheria, and acellular pertussis (Tdap, Td) vaccine. You may need a Td booster every 10 years.  Zoster vaccine. You may need this after age 58.  Pneumococcal 13-valent  conjugate (PCV13) vaccine. One dose is recommended after age 4.  Pneumococcal polysaccharide (PPSV23) vaccine. One dose is recommended after age 62. Talk to your health care provider about which screenings and vaccines you need and how often you need them. This information is not intended to replace advice given to you by your health care provider. Make sure you discuss any questions you have with your health care provider. Document Released: 01/20/2015 Document Revised: 09/13/2015 Document Reviewed: 10/25/2014 Elsevier Interactive Patient Education  2017 [place] Prevention in the Home Falls can cause injuries. They can happen to people of all ages. There are many things you can do to make your home safe and to help prevent falls. What can I do on the outside of my home?  Regularly fix the edges of walkways and driveways and fix any cracks.  Remove anything that might make you trip as you walk through a door, such as a raised step or threshold.  Trim any bushes or trees on the path to your home.  Use bright outdoor lighting.  Clear any walking paths of anything that might make someone trip, such as rocks or tools.  Regularly check to see if handrails are loose or broken. Make sure that both sides of any steps have handrails.  Any raised decks and porches should have guardrails on the edges.  Have any leaves, snow, or ice cleared regularly.  Use sand or salt on walking paths during winter.  Clean up any spills in your garage right away. This includes oil or grease spills. What can I do in the bathroom?  Use night lights.  Install grab bars by the toilet and in the tub and shower. Do not use towel bars as grab bars.  Use non-skid mats or decals in the tub or shower.  If you need to sit down in the shower, use a plastic, non-slip stool.  Keep the floor dry. Clean up any water that spills on the floor as soon as it happens.  Remove soap buildup in the tub or  shower regularly.  Attach bath mats securely with double-sided non-slip rug tape.  Do not have throw rugs and other things on the floor that can make you trip. What can I do in the bedroom?  Use night lights.  Make sure that you have a light by your bed that is easy to reach.  Do not use any sheets or blankets that are too big for your bed. They should not hang down onto the floor.  Have a firm chair that has side arms. You can use this for support while you get dressed.  Do not have throw rugs and other things on the floor that can make you trip. What can I do in the kitchen?  Clean up any spills right away.  Avoid walking on wet floors.  Keep items that you use a lot in easy-to-reach places.  If you need to reach something above you, use a strong step stool that has a grab bar.  Keep electrical cords out of the way.  Do not use floor polish or wax that makes floors slippery. If you must use wax, use non-skid floor wax.  Do not have throw rugs and other things on the floor that can make  you trip. What can I do with my stairs?  Do not leave any items on the stairs.  Make sure that there are handrails on both sides of the stairs and use them. Fix handrails that are broken or loose. Make sure that handrails are as long as the stairways.  Check any carpeting to make sure that it is firmly attached to the stairs. Fix any carpet that is loose or worn.  Avoid having throw rugs at the top or bottom of the stairs. If you do have throw rugs, attach them to the floor with carpet tape.  Make sure that you have a light switch at the top of the stairs and the bottom of the stairs. If you do not have them, ask someone to add them for you. What else can I do to help prevent falls?  Wear shoes that:  Do not have high heels.  Have rubber bottoms.  Are comfortable and fit you well.  Are closed at the toe. Do not wear sandals.  If you use a stepladder:  Make sure that it is fully  opened. Do not climb a closed stepladder.  Make sure that both sides of the stepladder are locked into place.  Ask someone to hold it for you, if possible.  Clearly mark and make sure that you can see:  Any grab bars or handrails.  First and last steps.  Where the edge of each step is.  Use tools that help you move around (mobility aids) if they are needed. These include:  Canes.  Walkers.  Scooters.  Crutches.  Turn on the lights when you go into a dark area. Replace any light bulbs as soon as they burn out.  Set up your furniture so you have a clear path. Avoid moving your furniture around.  If any of your floors are uneven, fix them.  If there are any pets around you, be aware of where they are.  Review your medicines with your doctor. Some medicines can make you feel dizzy. This can increase your chance of falling. Ask your doctor what other things that you can do to help prevent falls. This information is not intended to replace advice given to you by your health care provider. Make sure you discuss any questions you have with your health care provider. Document Released: 10/20/2008 Document Revised: 06/01/2015 Document Reviewed: 01/28/2014 Elsevier Interactive Patient Education  2017 [employer].

## 2019-04-19 NOTE — Progress Notes (Signed)
PCP notes:  Health Maintenance: Pneumovax 23- due   Abnormal Screenings: none   Patient concerns: none   Nurse concerns: none   Next PCP appt.: 04/23/2019 @ 3 pm

## 2019-04-22 ENCOUNTER — Other Ambulatory Visit: Payer: Self-pay | Admitting: Family Medicine

## 2019-04-22 ENCOUNTER — Other Ambulatory Visit: Payer: Self-pay

## 2019-04-22 DIAGNOSIS — E782 Mixed hyperlipidemia: Secondary | ICD-10-CM

## 2019-04-22 DIAGNOSIS — E119 Type 2 diabetes mellitus without complications: Secondary | ICD-10-CM

## 2019-04-22 NOTE — Telephone Encounter (Signed)
Last OV 11/04/18 Next apt 04/23/19 Sent in script

## 2019-04-23 ENCOUNTER — Ambulatory Visit (INDEPENDENT_AMBULATORY_CARE_PROVIDER_SITE_OTHER): Payer: Medicare Other | Admitting: Family Medicine

## 2019-04-23 ENCOUNTER — Encounter: Payer: Self-pay | Admitting: Family Medicine

## 2019-04-23 VITALS — BP 132/62 | HR 53 | Temp 98.1°F | Ht 63.0 in | Wt 176.0 lb

## 2019-04-23 DIAGNOSIS — Z6831 Body mass index (BMI) 31.0-31.9, adult: Secondary | ICD-10-CM

## 2019-04-23 DIAGNOSIS — G4733 Obstructive sleep apnea (adult) (pediatric): Secondary | ICD-10-CM | POA: Diagnosis not present

## 2019-04-23 DIAGNOSIS — G40009 Localization-related (focal) (partial) idiopathic epilepsy and epileptic syndromes with seizures of localized onset, not intractable, without status epilepticus: Secondary | ICD-10-CM

## 2019-04-23 DIAGNOSIS — R238 Other skin changes: Secondary | ICD-10-CM

## 2019-04-23 DIAGNOSIS — I10 Essential (primary) hypertension: Secondary | ICD-10-CM | POA: Diagnosis not present

## 2019-04-23 DIAGNOSIS — E6609 Other obesity due to excess calories: Secondary | ICD-10-CM

## 2019-04-23 MED ORDER — VALACYCLOVIR HCL 1 G PO TABS: 1000.0000 mg | ORAL_TABLET | Freq: Three times a day (TID) | ORAL | 0 refills | Status: AC

## 2019-04-23 NOTE — Progress Notes (Signed)
Subjective:    Patient ID: Jennifer [name], female    DOB: 08-Aug-1950, 69 y.o.   MRN: [id number]  HPI  Chief Complaint  Patient presents with  . Annual Exam   Review of Systems  Constitutional: Negative for chills, diaphoresis, fever, malaise/fatigue and weight loss.  HENT: Positive for hearing loss. Negative for congestion, ear discharge, ear pain, nosebleeds, sinus pain, sore throat and tinnitus.        Decreased hearing to the left ear  Eyes: Negative for blurred vision, double vision, photophobia, pain, discharge and redness.  Respiratory: Negative for cough, hemoptysis, sputum production, shortness of breath, wheezing and stridor.   Cardiovascular: Negative for chest pain, palpitations, orthopnea, claudication, leg swelling and PND.  Gastrointestinal: Positive for constipation. Negative for abdominal pain, blood in stool, diarrhea, heartburn, melena, nausea and vomiting.  Genitourinary: Negative for dysuria, flank pain, frequency, hematuria and urgency.  Musculoskeletal: Negative for back pain, falls, joint pain, myalgias and neck pain.  Skin:       Pustular skin lesions to perirectal area  Neurological: Negative for dizziness, tingling, tremors, sensory change, speech change, focal weakness, seizures, loss of consciousness, weakness and headaches.  Endo/Heme/Allergies: Negative for environmental allergies and polydipsia. Does not bruise/bleed easily.  Psychiatric/Behavioral: Negative for depression, hallucinations, memory loss, substance abuse and suicidal ideas. The patient is not nervous/anxious and does not have insomnia.        Review of Systems  Constitutional: Negative for chills, diaphoresis, fever, malaise/fatigue and weight loss.  HENT: Positive for hearing loss. Negative for congestion, ear discharge, ear pain, nosebleeds, sinus pain, sore throat and tinnitus.        Decreased hearing to the left ear  Eyes: Negative for blurred vision, double vision,  photophobia, pain, discharge and redness.  Respiratory: Negative for cough, hemoptysis, sputum production, shortness of breath, wheezing and stridor.   Cardiovascular: Negative for chest pain, palpitations, orthopnea, claudication, leg swelling and PND.  Gastrointestinal: Positive for constipation. Negative for abdominal pain, blood in stool, diarrhea, heartburn, melena, nausea and vomiting.  Endocrine: Negative for polydipsia.  Genitourinary: Negative for dysuria, flank pain, frequency, hematuria and urgency.  Musculoskeletal: Negative for back pain, falls, joint pain, myalgias and neck pain.  Skin:       Pustular skin lesions to perirectal area  Allergic/Immunologic: Negative for environmental allergies.  Neurological: Negative for dizziness, tingling, tremors, sensory change, speech change, focal weakness, seizures, loss of consciousness, weakness and headaches.  Hematological: Does not bruise/bleed easily.  Psychiatric/Behavioral: Negative for depression, hallucinations, memory loss, substance abuse and suicidal ideas. The patient is not nervous/anxious and does not have insomnia.        Objective:   Physical Exam HENT:     Head: Normocephalic and atraumatic.     Right Ear: External ear normal.      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     Conjunctiva/sclera: Conjunctivae normal.     Pupils: Pupils are equal, round, and reactive to light.  Neck:     Thyroid: No thyromegaly.     Vascular: No JVD.  Cardiovascular:     Rate and Rhythm: Normal rate and regular rhythm.     Pulses: Intact distal pulses.     Heart sounds: Normal heart sounds. No murmur.  Pulmonary:     Effort: Pulmonary effort is normal. No respiratory distress.     Breath sounds: Normal breath sounds. No wheezing or rales.  Abdominal:     General:  Bowel sounds are normal.     Palpations: Abdomen is soft.  Musculoskeletal:        General: No tenderness, deformity or  edema. Normal range of motion.     Cervical back: Normal range of motion and neck supple.  Skin:    General: Skin is warm and dry.     Coloration: Skin is not pale.     Findings: No erythema or rash.     Comments: Pustular lesions to perianal area  Neurological:     Mental Status: She is alert and oriented to person, place, and time.     GCS: GCS score is 15.     BP 132/62 (BP Location: Left Arm, Patient Position: Sitting, Cuff Size: Normal)   Pulse (!) 53   Temp 98.1 F (36.7 C) (Temporal)   Ht 5\' 3"  (1.6 m)   Wt 176 lb (79.8 kg)   LMP  (LMP Unknown)   SpO2 98%   BMI 31.18 kg/m  Physical Exam  Constitutional: She is oriented to person, place, and time and well-developed, well-nourished, and in no distress.  HENT:  Head: Normocephalic and atraumatic.  Right Ear: External ear normal.  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 Conjunctivae and EOM are normal.  Neck: No JVD present. No thyromegaly present.  Cardiovascular: Normal rate, regular rhythm, normal heart sounds and intact distal pulses.  No murmur heard. Pulmonary/Chest: Effort normal and breath sounds normal. No respiratory distress. She has no wheezes. She has no rales.  Abdominal: Soft. Bowel sounds are normal.  Musculoskeletal:        General: No tenderness, deformity or edema. Normal range of motion.     Cervical back: Normal range of motion and neck supple.  Neurological: She is alert and oriented to person, place, and time. GCS score is 15.  Skin: Skin is warm and dry. No rash noted. No erythema. No pallor.  Pustular lesions to perianal area          Assessment & Plan:  Herpes zoster We will prescribe Acyclovir 800 mg 4 times a day   Weight reduction  Please continue to follow the weight reduction program, you're doing great job!

## 2019-04-23 NOTE — Progress Notes (Signed)
Subjective:    Patient ID: Jennifer [name], female    DOB: [birth year], 69 y.o.   MRN: [id number]  HPI This is a 69 yo female who presents today for follow up of chronic medical conditions. She recently had AWV with wellness nurse.   Herpes outbreak- has noticed a rash around her rectum. Feels like prior herpes outbreak. Has not had outbreak in 30+ years. Not sexually active. Has not had Shingrix vaccine. Rash started about 2-3 days ago. No prodrome. Has upcoming colonoscopy next week, concerned about having to reschedule.   Seizure disorder- continues to be seizure free on Keppra. Follows annually with neurology.   OSA- endorses compliance with CPAP, has annual follow up. Feels rested, good energy.   Obesity- has lost about 40 pounds with [facility].    Review of Systems Denies chest pain, SOB, abdominal pain, diarrhea/ constipation, pedal edema.    Objective:   Physical Exam Vitals reviewed. Exam conducted with a chaperone present.  Constitutional:      General: She is not in acute distress.    Appearance: Normal appearance. She is obese. She is not ill-appearing, toxic-appearing or diaphoretic.  Eyes:     Conjunctiva/sclera: Conjunctivae normal.  Cardiovascular:     Rate and Rhythm: Regular rhythm.     Pulses: Normal pulses.     Heart sounds: Normal heart sounds.  Pulmonary:     Breath sounds: Normal breath sounds.  Chest:     Breasts:        Right: Normal.        Left: Normal.  Abdominal:     General: Abdomen is flat. Bowel sounds are normal. There is no distension.     Palpations: Abdomen is soft. There is no mass.     Tenderness: There is no abdominal tenderness. There is no guarding or rebound.     Hernia: No hernia is present.  Musculoskeletal:     Cervical back: Normal range of motion and neck supple.     Right lower leg: No edema.     Left lower leg: No edema.  Skin:    General: Skin is dry.     Findings: Rash (few scattered vessicle  around rectum. ) present.  Neurological:     Mental Status: She is alert and oriented to person, place, and time.  Psychiatric:        Mood and Affect: Mood normal.        Behavior: Behavior normal.        Thought Content: Thought content normal.        Judgment: Judgment normal.       BP 132/62 (BP Location: Left Arm, Patient Position: Sitting, Cuff Size: Normal)   Pulse (!) 53   Temp 98.1 F (36.7 C) (Temporal)   Ht 5\' 3"  (1.6 m)   Wt 176 lb (79.8 kg)   LMP  (LMP Unknown)   SpO2 98%   BMI 31.18 kg/m  Wt Readings from Last 3 Encounters:  04/23/19 176 lb (79.8 kg)  04/19/19 170 lb (77.1 kg)  04/15/19 176 lb 9.6 oz (80.1 kg)       Assessment & Plan:  1. Vesicular skin lesions - can not rule out shingles, has history of herpes in past, never in this location. Will treat with antiviral, recommended she get Shingrix vaccine in 2 months.  - valACYclovir (VALTREX) 1000 MG tablet; Take 1 tablet (1,000 mg total) by mouth 3 (three) times daily for 7 days.  Dispense:  21 tablet; Refill: 0  2. Localization-related idiopathic epilepsy and epileptic syndromes with seizures of localized onset, not intractable, without status epilepticus ([employer]) - no further seizures, continues follow up with neuro and plan to stay on Keppra  3. OSA (obstructive sleep apnea) - per patient, good compliance with CPAP, has annual follow up with pulmonary  4. Essential hypertension - well controlled  5. Class 1 obesity due to excess calories without serious comorbidity with body mass index (BMI) of 31.0 to 31.9 in adult - celebrated her weight loss and encouraged her continued efforts  - follow up in 6 months  This visit occurred during the SARS-CoV-2 public health emergency.  Safety protocols were in place, including screening questions prior to the visit, additional usage of staff PPE, and extensive cleaning of exam room while observing appropriate contact time as indicated for disinfecting solutions.       [name], FNP-BC  [facility]  04/27/2019 2:42 PM

## 2019-04-23 NOTE — Patient Instructions (Addendum)
Great to see you today  Keep up the [place] work with your diet and exercise  Follow up in 6 months

## 2019-04-26 ENCOUNTER — Encounter: Payer: Self-pay | Admitting: Family Medicine

## 2019-04-28 ENCOUNTER — Other Ambulatory Visit: Payer: Self-pay | Admitting: Internal Medicine

## 2019-04-28 ENCOUNTER — Telehealth: Payer: Self-pay | Admitting: Gastroenterology

## 2019-04-28 DIAGNOSIS — Z86010 Personal history of colonic polyps: Secondary | ICD-10-CM

## 2019-04-28 MED ORDER — SUPREP BOWEL PREP KIT 17.5-3.13-1.6 GM/177ML PO SOLN: 1.0000 | Freq: Once | ORAL | 0 refills | Status: AC

## 2019-04-28 NOTE — Telephone Encounter (Signed)
Pt informed of Dr Celesta Aver recommendation.  Suprep sent to pharmacy and new instructions sent via myChart

## 2019-04-28 NOTE — Telephone Encounter (Signed)
4 dulcolax this afternoon  Do Suprep tonight and tomorrow - it is on her formulary it says  Thanks!

## 2019-04-28 NOTE — Telephone Encounter (Signed)
Pt states that she drank first part of 2 day Miralax/Miralax prep- she has not had any results since then.  When I spoke with her, she states, "I haven't had any results yet, but my stomach is starting to gurgle. I think something may be getting ready to happen."  I told her to push her fluids and I will call her back at 1:00 pm to see if she has had any results.  Pt is agreeable to this.

## 2019-04-28 NOTE — Telephone Encounter (Signed)
Dr Carlean Purl, This pt did the first part of her 2 day Miralax/Miralax prep yesterday.  She called this am because she was concerned because she hadn't had a BM yet.  I just spoke with her and she has only had 1 BM since she started the prep.  She did have to repeat her last colonoscopy d/t a poor prep.  Would you like her to just continue with the normal Miralax prep this afternoon or do something else?  Please advise,  Thanks, Cyril Mourning

## 2019-04-28 NOTE — Telephone Encounter (Signed)
Pt is scheduled for a colonoscopy 04/29/19 and stated that she has not had a bm.

## 2019-04-29 ENCOUNTER — Encounter: Payer: Self-pay | Admitting: Internal Medicine

## 2019-04-29 ENCOUNTER — Other Ambulatory Visit: Payer: Self-pay

## 2019-04-29 ENCOUNTER — Ambulatory Visit (AMBULATORY_SURGERY_CENTER): Payer: Medicare Other | Admitting: Internal Medicine

## 2019-04-29 VITALS — BP 120/55 | HR 63 | Temp 96.8°F | Resp 12 | Ht 63.0 in | Wt 176.6 lb

## 2019-04-29 DIAGNOSIS — D125 Benign neoplasm of sigmoid colon: Secondary | ICD-10-CM

## 2019-04-29 DIAGNOSIS — D120 Benign neoplasm of cecum: Secondary | ICD-10-CM

## 2019-04-29 DIAGNOSIS — D123 Benign neoplasm of transverse colon: Secondary | ICD-10-CM | POA: Diagnosis not present

## 2019-04-29 DIAGNOSIS — Z86010 Personal history of colonic polyps: Secondary | ICD-10-CM | POA: Diagnosis not present

## 2019-04-29 DIAGNOSIS — D122 Benign neoplasm of ascending colon: Secondary | ICD-10-CM

## 2019-04-29 DIAGNOSIS — G4733 Obstructive sleep apnea (adult) (pediatric): Secondary | ICD-10-CM | POA: Diagnosis not present

## 2019-04-29 MED ORDER — SODIUM CHLORIDE 0.9 % IV SOLN: 500.0000 mL | Freq: Once | INTRAVENOUS | Status: DC

## 2019-04-29 NOTE — Progress Notes (Signed)
Report given to PACU, vss 

## 2019-04-29 NOTE — Progress Notes (Signed)
Pt's states no medical or surgical changes since previsit or office visit. 

## 2019-04-29 NOTE — Op Note (Signed)
[facility] Patient Name: Jennifer [name] Procedure Date: 04/29/2019 10:36 AM MRN: [id number] Endoscopist: Gatha Mayer , MD Age: 69 Referring MD:  Date of Birth: Nov 08, 1950 Gender: Female Account #: [id number] Procedure:                Colonoscopy Indications:              Surveillance: Personal history of adenomatous                            polyps on last colonoscopy 5 years ago Medicines:                Propofol per Anesthesia, Monitored Anesthesia Care Procedure:                Pre-Anesthesia Assessment:                           - Prior to the procedure, a History and Physical                            was performed, and patient medications and                            allergies were reviewed. The patient's tolerance of                            previous anesthesia was also reviewed. The risks                            and benefits of the procedure and the sedation                            options and risks were discussed with the patient.                            All questions were answered, and informed consent                            was obtained. Prior Anticoagulants: The patient has                            taken no previous anticoagulant or antiplatelet                            agents. ASA Grade Assessment: II - A patient with                            mild systemic disease. After reviewing the risks                            and benefits, the patient was deemed in                            satisfactory condition to undergo the procedure.  After obtaining informed consent, the colonoscope                            was passed under direct vision. Throughout the                            procedure, the patient's blood pressure, pulse, and                            oxygen saturations were monitored continuously. The                            Colonoscope was introduced through the anus and                             advanced to the the cecum, identified by                            appendiceal orifice and ileocecal valve. The                            colonoscopy was somewhat difficult due to a                            redundant colon and significant looping. Successful                            completion of the procedure was aided by                            straightening and shortening the scope to obtain                            bowel loop reduction and applying abdominal                            pressure. The patient tolerated the procedure well.                            The quality of the bowel preparation was good. The                            bowel preparation used was Miralax and SUPREP via                            split dose instruction. Scope In: 10:40:34 AM Scope Out: 11:04:20 AM Scope Withdrawal Time: 0 hours 15 minutes 12 seconds  Total Procedure Duration: 0 hours 23 minutes 46 seconds  Findings:                 The perianal and digital rectal examinations were                            normal.  Five sessile polyps were found in the sigmoid                            colon, transverse colon, ascending colon and cecum.                            The polyps were diminutive in size. These polyps                            were removed with a cold snare. Resection and                            retrieval were complete. Verification of patient                            identification for the specimen was done. Estimated                            blood loss was minimal.                           Multiple diverticula were found in the sigmoid                            colon and descending colon.                           The exam was otherwise without abnormality on                            direct and retroflexion views. Complications:            No immediate complications. Estimated Blood Loss:     Estimated blood loss was  minimal. Impression:               - Five diminutive polyps in the sigmoid colon, in                            the transverse colon, in the ascending colon and in                            the cecum, removed with a cold snare. Resected and                            retrieved.                           - Diverticulosis in the sigmoid colon and in the                            descending colon.                           - The examination was otherwise normal on direct  and retroflexion views.                           - Personal history of colonic polyps.                           -2004 single adenoma                           2008 3 adenomas                           2012 - small adenoma - sigmoid                           2013 - hyperplastic polyp                           03/29/2014 incomplete prep                           05/09/2014 no polyps Recommendation:           - Patient has a contact number available for                            emergencies. The signs and symptoms of potential                            delayed complications were discussed with the                            patient. Return to normal activities tomorrow.                            Written discharge instructions were provided to the                            patient.                           - Resume previous diet.                           - Continue present medications.                           - Repeat colonoscopy is recommended for                            surveillance. The colonoscopy date will be                            determined after pathology results from today's                            exam become available for review. would use double  prep that includes suprep again Gatha Mayer, MD 04/29/2019 11:13:15 AM This report has been signed electronically.

## 2019-04-29 NOTE — Patient Instructions (Addendum)
I found and removed 5 tiny polyps. You have diverticulosis - thickened muscle rings and pouches in the colon wall. Please read the handout about this condition.   I will let you know pathology results and when to have another routine colonoscopy by mail and/or My Chart.  I appreciate the opportunity to care for you. Gatha Mayer, MD, FACG  YOU HAD AN ENDOSCOPIC PROCEDURE TODAY AT [facility]:   Refer to the procedure report that was given to you for any specific questions about what was found during the examination.  If the procedure report does not answer your questions, please call your gastroenterologist to clarify.  If you requested that your care partner not be given the details of your procedure findings, then the procedure report has been included in a sealed envelope for you to review at your convenience later.  YOU SHOULD EXPECT: Some feelings of bloating in the abdomen. Passage of more gas than usual.  Walking can help get rid of the air that was put into your GI tract during the procedure and reduce the bloating. If you had a lower endoscopy (such as a colonoscopy or flexible sigmoidoscopy) you may notice spotting of blood in your stool or on the toilet paper. If you underwent a bowel prep for your procedure, you may not have a normal bowel movement for a few days.  Please Note:  You might notice some irritation and congestion in your nose or some drainage.  This is from the oxygen used during your procedure.  There is no need for concern and it should clear up in a day or so.  SYMPTOMS TO REPORT IMMEDIATELY:   Following lower endoscopy (colonoscopy or flexible sigmoidoscopy):  Excessive amounts of blood in the stool  Significant tenderness or worsening of abdominal pains  Swelling of the abdomen that is new, acute  Fever of 100F or higher  For urgent or emergent issues, a gastroenterologist can be reached at any hour by calling [phone]. Do not use  MyChart messaging for urgent concerns.    DIET:  We do recommend a small meal at first, but then you may proceed to your regular diet.  Drink plenty of fluids but you should avoid alcoholic beverages for 24 hours.  ACTIVITY:  You should plan to take it easy for the rest of today and you should NOT DRIVE or use heavy machinery until tomorrow (because of the sedation medicines used during the test).    FOLLOW UP: Our staff will call the number listed on your records 48-72 hours following your procedure to check on you and address any questions or concerns that you may have regarding the information given to you following your procedure. If we do not reach you, we will leave a message.  We will attempt to reach you two times.  During this call, we will ask if you have developed any symptoms of COVID 19. If you develop any symptoms (ie: fever, flu-like symptoms, shortness of breath, cough etc.) before then, please call [phone].  If you test positive for Covid 19 in the 2 weeks post procedure, please call and report this information to [place].    If any biopsies were taken you will be contacted by phone or by letter within the next 1-3 weeks.  Please call us at [phone] if you have not heard about the biopsies in 3 weeks.    SIGNATURES/CONFIDENTIALITY: You and/or your care partner have signed paperwork which will be entered  into your electronic medical record.  These signatures attest to the fact that that the information above on your After Visit Summary has been reviewed and is understood.  Full responsibility of the confidentiality of this discharge information lies with you and/or your care-partner.

## 2019-05-03 ENCOUNTER — Other Ambulatory Visit (INDEPENDENT_AMBULATORY_CARE_PROVIDER_SITE_OTHER): Payer: Self-pay | Admitting: Family Medicine

## 2019-05-03 ENCOUNTER — Telehealth: Payer: Self-pay

## 2019-05-03 ENCOUNTER — Telehealth: Payer: Self-pay | Admitting: *Deleted

## 2019-05-03 DIAGNOSIS — E559 Vitamin D deficiency, unspecified: Secondary | ICD-10-CM

## 2019-05-03 NOTE — Telephone Encounter (Signed)
Attempted to reach pt. With follow-up call following endoscopic procedure 04/29/2019.  LM on pt. Voice mail.  Will try to reach pt. Again later today.

## 2019-05-03 NOTE — Telephone Encounter (Signed)
  Follow up Call-  Call back number 04/29/2019  Post procedure Call Back phone  # [phone]  Permission to leave phone message Yes  Some recent data might be hidden     Patient questions:  Do you have a fever, pain , or abdominal swelling? No. Pain Score  0 *  Have you tolerated food without any problems? Yes.    Have you been able to return to your normal activities? Yes.    Do you have any questions about your discharge instructions: Diet   No. Medications  No. Follow up visit  No.  Do you have questions or concerns about your Care? No.  Actions: * If pain score is 4 or above: No action needed, pain <4.  1. Have you developed a fever since your procedure? no  2.   Have you had an respiratory symptoms (SOB or cough) since your procedure? no  3.   Have you tested positive for COVID 19 since your procedure no  4.   Have you had any family members/close contacts diagnosed with the COVID 19 since your procedure?  no   If yes to any of these questions please route to Joylene John, RN and Erenest Rasher, RN

## 2019-05-04 ENCOUNTER — Encounter: Payer: Self-pay | Admitting: Internal Medicine

## 2019-05-05 ENCOUNTER — Ambulatory Visit (INDEPENDENT_AMBULATORY_CARE_PROVIDER_SITE_OTHER): Payer: Medicare Other | Admitting: Family Medicine

## 2019-05-05 ENCOUNTER — Other Ambulatory Visit: Payer: Self-pay

## 2019-05-05 ENCOUNTER — Encounter (INDEPENDENT_AMBULATORY_CARE_PROVIDER_SITE_OTHER): Payer: Self-pay | Admitting: Family Medicine

## 2019-05-05 VITALS — BP 116/66 | HR 60 | Temp 97.8°F | Ht 63.0 in | Wt 166.0 lb

## 2019-05-05 DIAGNOSIS — E669 Obesity, unspecified: Secondary | ICD-10-CM | POA: Diagnosis not present

## 2019-05-05 DIAGNOSIS — Z6830 Body mass index (BMI) 30.0-30.9, adult: Secondary | ICD-10-CM

## 2019-05-05 DIAGNOSIS — R739 Hyperglycemia, unspecified: Secondary | ICD-10-CM | POA: Diagnosis not present

## 2019-05-05 DIAGNOSIS — E7849 Other hyperlipidemia: Secondary | ICD-10-CM | POA: Diagnosis not present

## 2019-05-05 DIAGNOSIS — E559 Vitamin D deficiency, unspecified: Secondary | ICD-10-CM | POA: Diagnosis not present

## 2019-05-05 DIAGNOSIS — E039 Hypothyroidism, unspecified: Secondary | ICD-10-CM

## 2019-05-05 DIAGNOSIS — K5900 Constipation, unspecified: Secondary | ICD-10-CM | POA: Diagnosis not present

## 2019-05-05 MED ORDER — VITAMIN D (ERGOCALCIFEROL) 1.25 MG (50000 UNIT) PO CAPS: 50000.0000 [IU] | ORAL_CAPSULE | ORAL | 0 refills | Status: DC

## 2019-05-06 LAB — COMPREHENSIVE METABOLIC PANEL
ALT: 35 IU/L — ABNORMAL HIGH (ref 0–32)
AST: 23 IU/L (ref 0–40)
Albumin/Globulin Ratio: 1.8 (ref 1.2–2.2)
Albumin: 4.6 g/dL (ref 3.8–4.8)
Alkaline Phosphatase: 117 IU/L (ref 39–117)
BUN/Creatinine Ratio: 25 (ref 12–28)
BUN: 20 mg/dL (ref 8–27)
Bilirubin Total: 0.5 mg/dL (ref 0.0–1.2)
CO2: 22 mmol/L (ref 20–29)
Calcium: 10 mg/dL (ref 8.7–10.3)
Chloride: 98 mmol/L (ref 96–106)
Creatinine, Ser: 0.8 mg/dL (ref 0.57–1.00)
GFR calc Af Amer: 88 mL/min/{1.73_m2} (ref >59)
GFR calc non Af Amer: 76 mL/min/{1.73_m2} (ref >59)
Globulin, Total: 2.5 g/dL (ref 1.5–4.5)
Glucose: 83 mg/dL (ref 65–99)
Potassium: 4.5 mmol/L (ref 3.5–5.2)
Sodium: 135 mmol/L (ref 134–144)
Total Protein: 7.1 g/dL (ref 6.0–8.5)

## 2019-05-06 LAB — LIPID PANEL
Chol/HDL Ratio: 3.5 ratio (ref 0.0–4.4)
Cholesterol, Total: 174 mg/dL (ref 100–199)
HDL: 50 mg/dL (ref >39)
LDL Chol Calc (NIH): 101 mg/dL — ABNORMAL HIGH (ref 0–99)
Triglycerides: 131 mg/dL (ref 0–149)
VLDL Cholesterol Cal: 23 mg/dL (ref 5–40)

## 2019-05-06 LAB — HEMOGLOBIN A1C
Est. average glucose Bld gHb Est-mCnc: 108 mg/dL
Hgb A1c MFr Bld: 5.4 % (ref 4.8–5.6)

## 2019-05-06 LAB — T4, FREE: Free T4: 1 ng/dL (ref 0.82–1.77)

## 2019-05-06 LAB — T3, FREE: T3, Free: 1.8 pg/mL — ABNORMAL LOW (ref 2.0–4.4)

## 2019-05-06 LAB — TSH: TSH: 2.47 u[IU]/mL (ref 0.450–4.500)

## 2019-05-06 LAB — INSULIN, RANDOM: INSULIN: 13.5 u[IU]/mL (ref 2.6–24.9)

## 2019-05-06 NOTE — Progress Notes (Signed)
Chief Complaint:   OBESITY Jennifer [name] is here to discuss her progress with her obesity treatment plan along with follow-up of her obesity related diagnoses. Jennifer [name] is on the Category 1 Plan and states she is following her eating plan approximately 90% of the time. Jennifer [name] states she is exercising with [employer], power walking for 25-30 minutes 4 times per week.  Today's visit was #: 14 Starting weight: 208 lbs Starting date: 08/25/2018 Today's weight: 166 lbs Today's date: 05/05/2019 Total lbs lost to date: 42 Total lbs lost since last in-office visit: 4  Interim History: Jennifer [name] continues to do well with weight loss on her plan. Her hunger is controlled and she is exercising regularly and feeling well overall.  Subjective:   1. Hyperglycemia Jennifer [name] is doing well with diet and exercise, and she is due for labs.  2. Vitamin D deficiency Jennifer [name] is stable on Vit D. She is at risk of over-replacement. She is due for labs.  3. Other hyperlipidemia Jennifer [name] is stable on diet, and she denies chest pain. She is due for labs.  4. Constipation, unspecified constipation type Jennifer [name] notes less frequent BM, especially after recent colonoscopy with small polyps found. She has questions about diet.  5. Hypothyroidism, unspecified type Jennifer [name] is stable on Synthroid, and she denies palpitations. Her energy is good.  Assessment/Plan:   1. Hyperglycemia Fasting labs will be obtained today and results with be discussed with Jennifer [name] in 2 weeks at her follow up visit. In the meanwhile Jennifer [name] was started on a lower simple carbohydrate diet and will work on weight loss efforts.  - Comprehensive metabolic panel - Hemoglobin A1c - Insulin, random  2. Vitamin D deficiency Low Vitamin D level contributes to fatigue and are associated with obesity, breast, and colon cancer. We will refill prescription Vitamin D for 1 month. Jennifer [name] will follow-up for routine testing of Vitamin D, at least 2-3  times per year to avoid over-replacement.  - Vitamin D, Ergocalciferol, (DRISDOL) 1.25 MG (50000 UNIT) CAPS capsule; Take 1 capsule (50,000 Units total) by mouth every 7 (seven) days.  Dispense: 4 capsule; Refill: 0  3. Other hyperlipidemia Cardiovascular risk and specific lipid/LDL goals reviewed. We discussed several lifestyle modifications today and Jennifer [name] will continue to work on diet, exercise and weight loss efforts. We will check labs today. Orders and follow up as documented in patient record.   - Lipid panel  4. Constipation, unspecified constipation type Jennifer [name] was informed that a decrease in bowel movement frequency is normal while losing weight, but stools should not be hard or painful. Increasing high fiber foods list was given and discussed. Orders and follow up as documented in patient record.   5. Hypothyroidism, unspecified type Patient with long-standing hypothyroidism, on levothyroxine therapy. She appears euthyroid. Orders and follow up as documented in patient record.  - T4, free - T3, free - TSH  6. Class 1 obesity with serious comorbidity and body mass index (BMI) of 30.0 to 30.9 in adult, unspecified obesity type Jennifer [name] is currently in the action stage of change. As such, her goal is to continue with weight loss efforts. She has agreed to the Category 1 Plan.   Exercise goals: As is.  Behavioral modification strategies: increasing water intake and increasing high fiber foods.  Jennifer [name] has agreed to follow-up with our clinic in 4 weeks. She was informed of the importance of frequent follow-up visits to maximize her success with intensive lifestyle modifications for her multiple health conditions.   Jennifer [name]  was informed we would discuss her lab results at her next visit unless there is a critical issue that needs to be addressed sooner. Jennifer [name] agreed to keep her next visit at the agreed upon time to discuss these results.  Objective:   Blood pressure 116/66,  pulse 60, temperature 97.8 F (36.6 C), temperature source Oral, height 5\' 3"  (1.6 m), weight 166 lb (75.3 kg), SpO2 97 %. Body mass index is 29.41 kg/m.  General: Cooperative, alert, well developed, in no acute distress. HEENT: Conjunctivae and lids unremarkable. Cardiovascular: Regular rhythm.  Lungs: Normal work of breathing. Neurologic: No focal deficits.   Lab Results  Component Value Date   CREATININE 0.80 05/05/2019   BUN 20 05/05/2019   NA 135 05/05/2019   K 4.5 05/05/2019   CL 98 05/05/2019   CO2 22 05/05/2019   Lab Results  Component Value Date   ALT 35 (H) 05/05/2019   AST 23 05/05/2019   ALKPHOS 117 05/05/2019   BILITOT 0.5 05/05/2019   Lab Results  Component Value Date   HGBA1C 5.4 05/05/2019   HGBA1C 5.4 12/14/2018   HGBA1C 5.4 08/25/2018   HGBA1C 5.2 09/16/2017   HGBA1C 5.4 03/20/2017   Lab Results  Component Value Date   INSULIN 13.5 05/05/2019   INSULIN 12.8 12/14/2018   INSULIN 14.7 08/25/2018   Lab Results  Component Value Date   TSH 2.470 05/05/2019   Lab Results  Component Value Date   CHOL 174 05/05/2019   HDL 50 05/05/2019   LDLCALC 101 (H) 05/05/2019   LDLDIRECT 96.0 05/04/2018   TRIG 131 05/05/2019   CHOLHDL 3.5 05/05/2019   Lab Results  Component Value Date   WBC 15.6 (H) 07/21/2018   HGB 11.0 (L) 07/21/2018   HCT 33.5 (L) 07/21/2018   MCV 95.7 07/21/2018   PLT 276 07/21/2018   No results found for: IRON, TIBC, FERRITIN  Obesity Behavioral Intervention Documentation for Insurance:   Approximately 15 minutes were spent on the discussion below.  ASK: We discussed the diagnosis of obesity with Jennifer [name] today and Jennifer [name] agreed to give [place] permission to discuss obesity behavioral modification therapy today.  ASSESS: Jennifer [name] has the diagnosis of obesity and her BMI today is 29.41. [name] is in the action stage of change.   ADVISE: Jennifer [name] was educated on the multiple health risks of obesity as well as the benefit of weight  loss to improve her health. She was advised of the need for long term treatment and the importance of lifestyle modifications to improve her current health and to decrease her risk of future health problems.  AGREE: Multiple dietary modification options and treatment options were discussed and Heleana agreed to follow the recommendations documented in the above note.  ARRANGE: Armeta was educated on the importance of frequent visits to treat obesity as outlined per CMS and USPSTF guidelines and agreed to schedule her next follow up appointment today.  Attestation Statements:   Reviewed by clinician on day of visit: allergies, medications, problem list, medical history, surgical history, family history, social history, and previous encounter notes.   I, Trixie Dredge, am acting as transcriptionist for Dennard Nip, MD.  I have reviewed the above documentation for accuracy and completeness, and I agree with the above. -  Dennard Nip, MD

## 2019-05-10 ENCOUNTER — Encounter (INDEPENDENT_AMBULATORY_CARE_PROVIDER_SITE_OTHER): Payer: Self-pay | Admitting: Family Medicine

## 2019-05-10 NOTE — Addendum Note (Signed)
Addended by: Lyn Records L on: 05/10/2019 03:44 PM   Modules accepted: Orders

## 2019-05-11 LAB — VITAMIN D 25 HYDROXY (VIT D DEFICIENCY, FRACTURES): Vit D, 25-Hydroxy: 79.8 ng/mL (ref 30.0–100.0)

## 2019-05-11 LAB — SPECIMEN STATUS REPORT

## 2019-05-12 ENCOUNTER — Ambulatory Visit: Payer: Medicare Other | Admitting: Psychology

## 2019-05-26 ENCOUNTER — Ambulatory Visit (INDEPENDENT_AMBULATORY_CARE_PROVIDER_SITE_OTHER): Payer: Medicare Other | Admitting: Psychology

## 2019-05-26 DIAGNOSIS — F320 Major depressive disorder, single episode, mild: Secondary | ICD-10-CM

## 2019-05-27 ENCOUNTER — Other Ambulatory Visit (INDEPENDENT_AMBULATORY_CARE_PROVIDER_SITE_OTHER): Payer: Self-pay | Admitting: Family Medicine

## 2019-05-27 DIAGNOSIS — E559 Vitamin D deficiency, unspecified: Secondary | ICD-10-CM

## 2019-05-31 ENCOUNTER — Encounter (INDEPENDENT_AMBULATORY_CARE_PROVIDER_SITE_OTHER): Payer: Self-pay | Admitting: Family Medicine

## 2019-05-31 ENCOUNTER — Other Ambulatory Visit: Payer: Self-pay

## 2019-05-31 ENCOUNTER — Ambulatory Visit (INDEPENDENT_AMBULATORY_CARE_PROVIDER_SITE_OTHER): Payer: Medicare Other | Admitting: Family Medicine

## 2019-05-31 VITALS — BP 138/73 | HR 79 | Temp 98.0°F | Ht 63.0 in | Wt 164.0 lb

## 2019-05-31 DIAGNOSIS — Z6830 Body mass index (BMI) 30.0-30.9, adult: Secondary | ICD-10-CM

## 2019-05-31 DIAGNOSIS — E669 Obesity, unspecified: Secondary | ICD-10-CM

## 2019-05-31 DIAGNOSIS — F3289 Other specified depressive episodes: Secondary | ICD-10-CM | POA: Diagnosis not present

## 2019-05-31 DIAGNOSIS — E7849 Other hyperlipidemia: Secondary | ICD-10-CM | POA: Diagnosis not present

## 2019-05-31 DIAGNOSIS — E8881 Metabolic syndrome: Secondary | ICD-10-CM

## 2019-05-31 DIAGNOSIS — E559 Vitamin D deficiency, unspecified: Secondary | ICD-10-CM | POA: Diagnosis not present

## 2019-05-31 NOTE — Progress Notes (Signed)
Chief Complaint:   OBESITY Jennifer [name] is here to discuss her progress with her obesity treatment plan along with follow-up of her obesity related diagnoses. Jennifer [name] is on the Category 1 Plan and states she is following her eating plan approximately 90% of the time. Jennifer [name] states she is exercising with walking videos for 30-40 minutes 5 times per week.  Today's visit was #: 15 Starting weight: 208 lbs Starting date: 08/25/2018 Today's weight: 164 lbs Today's date: 05/31/2019 Total lbs lost to date: 44 Total lbs lost since last in-office visit: 2  Interim History: Jennifer [name] states she feels just great. She feels that the Category 1 meal plan is quite easy to follow and she is not growing tired of the food or meals on the plan. She has family coming into town soon, and we discussed strategies to remain on the plan as much as possible.  Subjective:   1. Vitamin D deficiency Jennifer [name]'s Vit D level on 05/05/2019 was 79.8. She has been on prescription strength Vit D supplementation, and is tolerating it well. I discussed labs with the patient today.  2. Other hyperlipidemia Jennifer [name]'s lipid panel on 05/05/2019, showed a total cholesterol of 174, triglycerides 131, HDL 50, and LDL 101. She is on pravastatin 80 mg q daily, and is tolerating it well. I discussed labs with the patient today.  3. Other depression with emotional eating Jennifer [name] has been on citalopram 40 mg q daily for many years. She reports stable mood, and denies suicidal ideas or homicidal ideas.  4. Insulin resistance Jennifer [name]'s labs on 05/05/2019, showed A1c at 5.4, BGs 83, and insulin level at 13.5. Her insulin level slightly increased from 12.8 on 12/14/2018. However, her BG and A1c both are at goal. I discussed labs with the patient today.  Assessment/Plan:   1. Vitamin D deficiency Low Vitamin D level contributes to fatigue and are associated with obesity, breast, and colon cancer.Jennifer [name] agreed to discontinue prescription Vit  D, and start OTC Vit D3 2,000 IU once daily. She will follow-up for routine testing of Vitamin D, at least 2-3 times per year to avoid over-replacement. We will recheck labs in June 2021.  2. Other hyperlipidemia Cardiovascular risk and specific lipid/LDL goals reviewed. We discussed several lifestyle modifications today. Jennifer [name] will continue her Category 1 meal plan, and will continue to work on exercise and weight loss efforts. She will continue her current statin therapy as is. Orders and follow up as documented in patient record.   3. Other depression with emotional eating Behavior modification techniques were discussed today to help Jennifer [name] deal with her emotional/non-hunger eating behaviors. Jennifer [name] will continue her current anti-depressant regimen. Orders and follow up as documented in patient record.   4. Insulin resistance Jennifer [name] will continue her Category 1 meal plan, and will continue to work on weight loss, regular exercise, and decreasing simple carbohydrates to help decrease the risk of diabetes. Jennifer [name] agreed to follow-up with [place] as directed to closely monitor her progress.  5. Class 1 obesity with serious comorbidity and body mass index (BMI) of 30.0 to 30.9 in adult, unspecified obesity type Jennifer [name] is currently in the action stage of change. As such, her goal is to continue with weight loss efforts. She has agreed to the Category 1 Plan.   Exercise goals: As is.  Behavioral modification strategies: increasing lean protein intake, decreasing simple carbohydrates, increasing water intake, meal planning and cooking strategies and celebration eating strategies.  Jennifer [name] has agreed to follow-up with our clinic in  4 weeks. She was informed of the importance of frequent follow-up visits to maximize her success with intensive lifestyle modifications for her multiple health conditions.   Objective:   Blood pressure 138/73, pulse 79, temperature 98 F (36.7 C), temperature source  Oral, height 5\' 3"  (1.6 m), weight 164 lb (74.4 kg), SpO2 95 %. Body mass index is 29.05 kg/m.  General: Cooperative, alert, well developed, in no acute distress. HEENT: Conjunctivae and lids unremarkable. Cardiovascular: Regular rhythm.  Lungs: Normal work of breathing. Neurologic: No focal deficits.   Lab Results  Component Value Date   CREATININE 0.80 05/05/2019   BUN 20 05/05/2019   NA 135 05/05/2019   K 4.5 05/05/2019   CL 98 05/05/2019   CO2 22 05/05/2019   Lab Results  Component Value Date   ALT 35 (H) 05/05/2019   AST 23 05/05/2019   ALKPHOS 117 05/05/2019   BILITOT 0.5 05/05/2019   Lab Results  Component Value Date   HGBA1C 5.4 05/05/2019   HGBA1C 5.4 12/14/2018   HGBA1C 5.4 08/25/2018   HGBA1C 5.2 09/16/2017   HGBA1C 5.4 03/20/2017   Lab Results  Component Value Date   INSULIN 13.5 05/05/2019   INSULIN 12.8 12/14/2018   INSULIN 14.7 08/25/2018   Lab Results  Component Value Date   TSH 2.470 05/05/2019   Lab Results  Component Value Date   CHOL 174 05/05/2019   HDL 50 05/05/2019   LDLCALC 101 (H) 05/05/2019   LDLDIRECT 96.0 05/04/2018   TRIG 131 05/05/2019   CHOLHDL 3.5 05/05/2019   Lab Results  Component Value Date   WBC 15.6 (H) 07/21/2018   HGB 11.0 (L) 07/21/2018   HCT 33.5 (L) 07/21/2018   MCV 95.7 07/21/2018   PLT 276 07/21/2018   No results found for: IRON, TIBC, FERRITIN  Obesity Behavioral Intervention Documentation for Insurance:   Approximately 15 minutes were spent on the discussion below.  ASK: We discussed the diagnosis of obesity with Jennifer [name] today and Jennifer [name] agreed to give [place] permission to discuss obesity behavioral modification therapy today.  ASSESS: Jennifer [name] has the diagnosis of obesity and her BMI today is 29.06. Jennifer [name] is in the action stage of change.   ADVISE: Jennifer [name] was educated on the multiple health risks of obesity as well as the benefit of weight loss to improve her health. She was advised of the need for  long term treatment and the importance of lifestyle modifications to improve her current health and to decrease her risk of future health problems.  AGREE: Multiple dietary modification options and treatment options were discussed and Jennifer [name] agreed to follow the recommendations documented in the above note.  ARRANGE: Jennifer [name] was educated on the importance of frequent visits to treat obesity as outlined per CMS and USPSTF guidelines and agreed to schedule her next follow up appointment today.  Attestation Statements:   Reviewed by clinician on day of visit: allergies, medications, problem list, medical history, surgical history, family history, social history, and previous encounter notes.   I, Trixie Dredge, am acting as transcriptionist for Dennard Nip, MD.  I have reviewed the above documentation for accuracy and completeness, and I agree with the above. -  Dennard Nip, MD

## 2019-05-31 NOTE — Telephone Encounter (Signed)
Please advise 

## 2019-06-01 NOTE — Telephone Encounter (Signed)
For you -

## 2019-06-09 ENCOUNTER — Ambulatory Visit: Payer: Medicare Other | Admitting: Psychology

## 2019-06-13 ENCOUNTER — Other Ambulatory Visit: Payer: Self-pay | Admitting: Family Medicine

## 2019-06-13 DIAGNOSIS — E119 Type 2 diabetes mellitus without complications: Secondary | ICD-10-CM

## 2019-06-13 DIAGNOSIS — I10 Essential (primary) hypertension: Secondary | ICD-10-CM

## 2019-06-17 ENCOUNTER — Other Ambulatory Visit: Payer: Self-pay | Admitting: Family Medicine

## 2019-06-17 DIAGNOSIS — F329 Major depressive disorder, single episode, unspecified: Secondary | ICD-10-CM

## 2019-06-17 NOTE — Telephone Encounter (Signed)
Last OV 04/23/19  Last refill 12/24/2018 #90 x 1 refills  Please advise, thanks.

## 2019-06-23 ENCOUNTER — Ambulatory Visit: Payer: Medicare Other | Admitting: Psychology

## 2019-06-28 ENCOUNTER — Encounter (INDEPENDENT_AMBULATORY_CARE_PROVIDER_SITE_OTHER): Payer: Self-pay | Admitting: Family Medicine

## 2019-06-28 ENCOUNTER — Ambulatory Visit (INDEPENDENT_AMBULATORY_CARE_PROVIDER_SITE_OTHER): Payer: Medicare Other | Admitting: Family Medicine

## 2019-06-28 ENCOUNTER — Telehealth: Payer: Self-pay

## 2019-06-28 ENCOUNTER — Other Ambulatory Visit: Payer: Self-pay

## 2019-06-28 VITALS — BP 118/69 | HR 58 | Temp 98.2°F | Ht 63.0 in | Wt 163.0 lb

## 2019-06-28 DIAGNOSIS — I10 Essential (primary) hypertension: Secondary | ICD-10-CM | POA: Diagnosis not present

## 2019-06-28 DIAGNOSIS — E669 Obesity, unspecified: Secondary | ICD-10-CM

## 2019-06-28 DIAGNOSIS — Z6830 Body mass index (BMI) 30.0-30.9, adult: Secondary | ICD-10-CM

## 2019-06-28 NOTE — Telephone Encounter (Signed)
DEXA order faxed to [facility].  Pt to have DEXA done 08-09-19 w/her screening mammogram.

## 2019-06-30 NOTE — Progress Notes (Signed)
Chief Complaint:   OBESITY Jennifer [name] is here to discuss her progress with her obesity treatment plan along with follow-up of her obesity related diagnoses. Jennifer [name] is on the Category 1 Plan and states she is following her eating plan approximately 80% of the time. Jennifer [name] states she is walking 3 miles for 45 minutes 5 times per week.  Today's visit was #: 19 Starting weight: 208 lbs Starting date: 08/25/2018 Today's weight: 163 lbs Today's date: 06/28/2019 Total lbs lost to date: 45 Total lbs lost since last in-office visit: 1  Interim History: Jennifer [name] had extra challenges since her last visit with her son and his family visiting. She did very well with portion control and smarter food choices, and she was able to lose another pound.  Subjective:   1. Essential hypertension Jennifer [name]'s blood pressure is well controlled on her medications and with diet, and exercise. She denies chest pain or feeling lightheaded.  Assessment/Plan:   1. Essential hypertension Jennifer [name] will continue healthy weight loss, diet, and exercise to improve blood pressure control. We will watch for signs of hypotension as she continues her lifestyle modifications.  2. Class 1 obesity with serious comorbidity and body mass index (BMI) of 30.0 to 30.9 in adult, unspecified obesity type Jennifer [name] is currently in the action stage of change. As such, her goal is to continue with weight loss efforts. She has agreed to the Category 1 Plan.   Exercise goals: As is, add yoga ball or dyna-disk exercises discussed. She is to work on care strategies.  Behavioral modification strategies: increasing lean protein intake and increasing water intake.  Jennifer [name] has agreed to follow-up with our clinic in 4 weeks. She was informed of the importance of frequent follow-up visits to maximize her success with intensive lifestyle modifications for her multiple health conditions.   Objective:   Blood pressure 118/69, pulse (!) 58,  temperature 98.2 F (36.8 C), temperature source Oral, height 5\' 3"  (1.6 m), weight 163 lb (73.9 kg), SpO2 96 %. Body mass index is 28.87 kg/m.  General: Cooperative, alert, well developed, in no acute distress. HEENT: Conjunctivae and lids unremarkable. Cardiovascular: Regular rhythm.  Lungs: Normal work of breathing. Neurologic: No focal deficits.   Lab Results  Component Value Date   CREATININE 0.80 05/05/2019   BUN 20 05/05/2019   NA 135 05/05/2019   K 4.5 05/05/2019   CL 98 05/05/2019   CO2 22 05/05/2019   Lab Results  Component Value Date   ALT 35 (H) 05/05/2019   AST 23 05/05/2019   ALKPHOS 117 05/05/2019   BILITOT 0.5 05/05/2019   Lab Results  Component Value Date   HGBA1C 5.4 05/05/2019   HGBA1C 5.4 12/14/2018   HGBA1C 5.4 08/25/2018   HGBA1C 5.2 09/16/2017   HGBA1C 5.4 03/20/2017   Lab Results  Component Value Date   INSULIN 13.5 05/05/2019   INSULIN 12.8 12/14/2018   INSULIN 14.7 08/25/2018   Lab Results  Component Value Date   TSH 2.470 05/05/2019   Lab Results  Component Value Date   CHOL 174 05/05/2019   HDL 50 05/05/2019   LDLCALC 101 (H) 05/05/2019   LDLDIRECT 96.0 05/04/2018   TRIG 131 05/05/2019   CHOLHDL 3.5 05/05/2019   Lab Results  Component Value Date   WBC 15.6 (H) 07/21/2018   HGB 11.0 (L) 07/21/2018   HCT 33.5 (L) 07/21/2018   MCV 95.7 07/21/2018   PLT 276 07/21/2018   No results found for: IRON, TIBC, FERRITIN  Attestation Statements:   Reviewed by clinician on day of visit: allergies, medications, problem list, medical history, surgical history, family history, social history, and previous encounter notes.  Time spent on visit including pre-visit chart review and post-visit care and charting was 32 minutes.    I, Trixie Dredge, am acting as transcriptionist for Dennard Nip, MD.  I have reviewed the above documentation for accuracy and completeness, and I agree with the above. -  Dennard Nip, MD

## 2019-07-02 ENCOUNTER — Encounter: Payer: Self-pay | Admitting: Family Medicine

## 2019-07-02 ENCOUNTER — Other Ambulatory Visit: Payer: Self-pay | Admitting: Family Medicine

## 2019-07-02 DIAGNOSIS — R238 Other skin changes: Secondary | ICD-10-CM

## 2019-07-02 MED ORDER — VALACYCLOVIR HCL 1 G PO TABS: 1000.0000 mg | ORAL_TABLET | Freq: Two times a day (BID) | ORAL | 2 refills | Status: DC

## 2019-07-07 ENCOUNTER — Ambulatory Visit: Payer: PRIVATE HEALTH INSURANCE | Admitting: Psychology

## 2019-07-21 ENCOUNTER — Ambulatory Visit (INDEPENDENT_AMBULATORY_CARE_PROVIDER_SITE_OTHER): Payer: Medicare Other | Admitting: Psychology

## 2019-07-21 DIAGNOSIS — F320 Major depressive disorder, single episode, mild: Secondary | ICD-10-CM | POA: Diagnosis not present

## 2019-07-26 ENCOUNTER — Encounter (INDEPENDENT_AMBULATORY_CARE_PROVIDER_SITE_OTHER): Payer: Self-pay | Admitting: Family Medicine

## 2019-07-26 ENCOUNTER — Other Ambulatory Visit: Payer: Self-pay

## 2019-07-26 ENCOUNTER — Ambulatory Visit (INDEPENDENT_AMBULATORY_CARE_PROVIDER_SITE_OTHER): Payer: Medicare Other | Admitting: Family Medicine

## 2019-07-26 VITALS — BP 118/68 | HR 61 | Temp 97.8°F | Ht 63.0 in | Wt 163.0 lb

## 2019-07-26 DIAGNOSIS — I10 Essential (primary) hypertension: Secondary | ICD-10-CM

## 2019-07-26 DIAGNOSIS — Z6830 Body mass index (BMI) 30.0-30.9, adult: Secondary | ICD-10-CM

## 2019-07-26 DIAGNOSIS — E669 Obesity, unspecified: Secondary | ICD-10-CM | POA: Diagnosis not present

## 2019-07-28 NOTE — Progress Notes (Signed)
Chief Complaint:   OBESITY Rayley is here to discuss her progress with her obesity treatment plan along with follow-up of her obesity related diagnoses. Tonnia is on the Category 1 Plan and states she is following her eating plan approximately 85% of the time. Aubrionna states she is walking and power boosted for 3 miles 45 minutes 4 times per week.  Today's visit was #: 11 Starting weight: 208 lbs Starting date: 08/25/2018 Today's weight: 163 lbs Today's date: 07/26/2019 Total lbs lost to date: 45 Total lbs lost since last in-office visit: 0  Interim History: Haruye has done well maintaining her weight loss. She does well meeting her protein goals, and she has increased her fiber intake. Her hunger is mostly controlled.  Subjective:   1. Essential hypertension Shaquita's blood pressure is well controlled with weight loss and medications. She denies feeling lightheaded or dizzy.  Assessment/Plan:   1. Essential hypertension Azra is working on healthy weight loss, diet, and exercise to improve blood pressure control. We will watch for signs of hypotension as she continues her lifestyle modifications.  2. Class 1 obesity with serious comorbidity and body mass index (BMI) of 30.0 to 30.9 in adult, unspecified obesity type Raenah is currently in the action stage of change. As such, her goal is to continue with weight loss efforts. She has agreed to the Category 1 Plan.   Exercise goals: As is.  Behavioral modification strategies: increasing water intake.  Kadeshia has agreed to follow-up with our clinic in 4 weeks. She was informed of the importance of frequent follow-up visits to maximize her success with intensive lifestyle modifications for her multiple health conditions.   Objective:   Blood pressure 118/68, pulse 61, temperature 97.8 F (36.6 C), temperature source Oral, height 5\' 3"  (1.6 m), weight 163 lb (73.9 kg), SpO2 98 %. Body mass index is 28.87  kg/m.  General: Cooperative, alert, well developed, in no acute distress. HEENT: Conjunctivae and lids unremarkable. Cardiovascular: Regular rhythm.  Lungs: Normal work of breathing. Neurologic: No focal deficits.   Lab Results  Component Value Date   CREATININE 0.80 05/05/2019   BUN 20 05/05/2019   NA 135 05/05/2019   K 4.5 05/05/2019   CL 98 05/05/2019   CO2 22 05/05/2019   Lab Results  Component Value Date   ALT 35 (H) 05/05/2019   AST 23 05/05/2019   ALKPHOS 117 05/05/2019   BILITOT 0.5 05/05/2019   Lab Results  Component Value Date   HGBA1C 5.4 05/05/2019   HGBA1C 5.4 12/14/2018   HGBA1C 5.4 08/25/2018   HGBA1C 5.2 09/16/2017   HGBA1C 5.4 03/20/2017   Lab Results  Component Value Date   INSULIN 13.5 05/05/2019   INSULIN 12.8 12/14/2018   INSULIN 14.7 08/25/2018   Lab Results  Component Value Date   TSH 2.470 05/05/2019   Lab Results  Component Value Date   CHOL 174 05/05/2019   HDL 50 05/05/2019   LDLCALC 101 (H) 05/05/2019   LDLDIRECT 96.0 05/04/2018   TRIG 131 05/05/2019   CHOLHDL 3.5 05/05/2019   Lab Results  Component Value Date   WBC 15.6 (H) 07/21/2018   HGB 11.0 (L) 07/21/2018   HCT 33.5 (L) 07/21/2018   MCV 95.7 07/21/2018   PLT 276 07/21/2018   No results found for: IRON, TIBC, FERRITIN  Attestation Statements:   Reviewed by clinician on day of visit: allergies, medications, problem list, medical history, surgical history, family history, social history, and previous encounter notes.  Time spent on visit including pre-visit chart review and post-visit care and charting was 33 minutes.    I, Trixie Dredge, am acting as transcriptionist for Dennard Nip, MD.  I have reviewed the above documentation for accuracy and completeness, and I agree with the above. -  Dennard Nip, MD

## 2019-08-09 ENCOUNTER — Encounter: Payer: Self-pay | Admitting: Family Medicine

## 2019-08-09 DIAGNOSIS — Z1231 Encounter for screening mammogram for malignant neoplasm of breast: Secondary | ICD-10-CM | POA: Diagnosis not present

## 2019-08-09 DIAGNOSIS — Z780 Asymptomatic menopausal state: Secondary | ICD-10-CM | POA: Diagnosis not present

## 2019-08-09 LAB — HM MAMMOGRAPHY

## 2019-08-10 ENCOUNTER — Encounter: Payer: Self-pay | Admitting: Family Medicine

## 2019-08-12 ENCOUNTER — Other Ambulatory Visit: Payer: Self-pay | Admitting: Neurology

## 2019-08-12 DIAGNOSIS — G40009 Localization-related (focal) (partial) idiopathic epilepsy and epileptic syndromes with seizures of localized onset, not intractable, without status epilepticus: Secondary | ICD-10-CM

## 2019-08-18 ENCOUNTER — Ambulatory Visit: Payer: PRIVATE HEALTH INSURANCE | Admitting: Psychology

## 2019-08-20 ENCOUNTER — Encounter: Payer: Self-pay | Admitting: Family Medicine

## 2019-08-23 ENCOUNTER — Other Ambulatory Visit: Payer: Self-pay | Admitting: Family Medicine

## 2019-08-23 DIAGNOSIS — E039 Hypothyroidism, unspecified: Secondary | ICD-10-CM

## 2019-08-23 NOTE — Telephone Encounter (Signed)
Last prescribed on 09/07/2018 Last OV (cpe ) with Tor Netters on  04/23/2019 No future OV scheduled

## 2019-08-24 ENCOUNTER — Other Ambulatory Visit: Payer: Self-pay

## 2019-08-24 ENCOUNTER — Encounter: Payer: Self-pay | Admitting: Neurology

## 2019-08-24 ENCOUNTER — Ambulatory Visit (INDEPENDENT_AMBULATORY_CARE_PROVIDER_SITE_OTHER): Payer: Medicare Other | Admitting: Neurology

## 2019-08-24 VITALS — BP 137/65 | HR 52 | Ht 63.0 in | Wt 166.4 lb

## 2019-08-24 DIAGNOSIS — G40009 Localization-related (focal) (partial) idiopathic epilepsy and epileptic syndromes with seizures of localized onset, not intractable, without status epilepticus: Secondary | ICD-10-CM

## 2019-08-24 MED ORDER — LEVETIRACETAM 500 MG PO TABS: 500.0000 mg | ORAL_TABLET | Freq: Two times a day (BID) | ORAL | 3 refills | Status: DC

## 2019-08-24 NOTE — Patient Instructions (Addendum)
Always good to see you! Continue Levetiracetam 500mg  twice a day. Follow-up in 1 year, call for any changes. Have a good and safe trip!   Seizure Precautions: 1. If medication has been prescribed for you to prevent seizures, take it exactly as directed.  Do not stop taking the medicine without talking to your doctor first, even if you have not had a seizure in a long time.   2. Avoid activities in which a seizure would cause danger to yourself or to others.  Don't operate dangerous machinery, swim alone, or climb in high or dangerous places, such as on ladders, roofs, or girders.  Do not drive unless your doctor says you may.  3. If you have any warning that you may have a seizure, lay down in a safe place where you can't hurt yourself.    4.  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   Please refer to the following link on the [employer] website for more information: http://www.epilepsyfoundation.org/answerplace/Social/driving/drivingu.cfm   5.  Maintain good sleep hygiene. Avoid alcohol.  6.  Contact your doctor if you have any problems that may be related to the medicine you are taking.  7.  Call 911 and bring the patient back to the ED if:        A.  The seizure lasts longer than 5 minutes.       B.  The patient doesn't awaken shortly after the seizure  C.  The patient has new problems such as difficulty seeing, speaking or moving  D.  The patient was injured during the seizure  E.  The patient has a temperature over 102 F (39C)  F.  The patient vomited and now is having trouble breathing
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
## 2019-08-30 ENCOUNTER — Other Ambulatory Visit: Payer: Self-pay

## 2019-08-30 ENCOUNTER — Other Ambulatory Visit (INDEPENDENT_AMBULATORY_CARE_PROVIDER_SITE_OTHER): Payer: Self-pay | Admitting: Family Medicine

## 2019-08-30 ENCOUNTER — Encounter (INDEPENDENT_AMBULATORY_CARE_PROVIDER_SITE_OTHER): Payer: Self-pay | Admitting: Family Medicine

## 2019-08-30 ENCOUNTER — Ambulatory Visit (INDEPENDENT_AMBULATORY_CARE_PROVIDER_SITE_OTHER): Payer: Medicare Other | Admitting: Family Medicine

## 2019-08-30 VITALS — BP 120/67 | HR 59 | Temp 97.6°F | Ht 63.0 in | Wt 159.0 lb

## 2019-08-30 DIAGNOSIS — E038 Other specified hypothyroidism: Secondary | ICD-10-CM

## 2019-08-30 DIAGNOSIS — E7849 Other hyperlipidemia: Secondary | ICD-10-CM

## 2019-08-30 DIAGNOSIS — E039 Hypothyroidism, unspecified: Secondary | ICD-10-CM | POA: Diagnosis not present

## 2019-08-30 DIAGNOSIS — K760 Fatty (change of) liver, not elsewhere classified: Secondary | ICD-10-CM

## 2019-08-30 DIAGNOSIS — E559 Vitamin D deficiency, unspecified: Secondary | ICD-10-CM

## 2019-08-30 DIAGNOSIS — Z6830 Body mass index (BMI) 30.0-30.9, adult: Secondary | ICD-10-CM | POA: Diagnosis not present

## 2019-08-30 DIAGNOSIS — E669 Obesity, unspecified: Secondary | ICD-10-CM | POA: Diagnosis not present

## 2019-08-30 NOTE — Progress Notes (Signed)
Chief Complaint:   OBESITY Jennifer [name] is here to discuss her progress with her obesity treatment plan along with follow-up of her obesity related diagnoses. Jennifer [name] is on the Category 1 Plan and states she is following her eating plan approximately 90% of the time. Jennifer [name] states she is walking for 45 minutes 3-4 times per week.  Today's visit was #: 18 Starting weight: 208 lbs Starting date: 08/25/2018 Today's weight: 159 lbs Today's date: 08/30/2019 Total lbs lost to date: 49 Total lbs lost since last in-office visit: 4  Interim History: Jennifer [name] continues to do well with weight loss. She will be doing more traveling in the next month, and she is making strategies on how to do "damage control" while traveling.  Subjective:   1. Other hyperlipidemia Jennifer [name] is on pravastatin, and she is working on diet and weight loss. Last LDL was at 101.  2. NAFLD (nonalcoholic fatty liver disease) Jennifer [name]'s last ALT was slightly elevated. She has done well with diet and weight loss. She is due for labs.  3. Vitamin D deficiency Jennifer [name] is on OTC Vit D, and she is due for labs. She is at risk of over-replacement.  4. Other specified hypothyroidism Jennifer [name] is tolerating Synthroid, and she has been losing weight so her dose may need to be adjusted.  Assessment/Plan:   1. Other hyperlipidemia Cardiovascular risk and specific lipid/LDL goals reviewed. We discussed several lifestyle modifications today. We will check labs today. Jennifer [name] will continue her medications, and will continue to work on diet, exercise and weight loss efforts. Orders and follow up as documented in patient record.   - Lipid Panel With LDL/HDL Ratio  2. NAFLD (nonalcoholic fatty liver disease) We discussed the likely diagnosis of non-alcoholic fatty liver disease today and how this condition is obesity related. Jennifer [name] was educated the importance of weight loss. We will check labs today. Jennifer [name] agreed to continue with her  weight loss efforts with healthier diet and exercise as an essential part of her treatment plan.  - Comprehensive metabolic panel  3. Vitamin D deficiency Low Vitamin D level contributes to fatigue and are associated with obesity, breast, and colon cancer. We will check labs today. Jennifer [name] will follow-up for routine testing of Vitamin D, at least 2-3 times per year to avoid over-replacement.  - VITAMIN D 25 Hydroxy (Vit-D Deficiency, Fractures)  4. Other specified hypothyroidism Patient with long-standing hypothyroidism, on Synthroid therapy. Jennifer [name] appears euthyroid. We will check labs today. Orders and follow up as documented in patient record.  - TSH - T4, free - T3  5. Class 1 obesity with serious comorbidity and body mass index (BMI) of 30.0 to 30.9 in adult, unspecified obesity type Jennifer [name] is currently in the action stage of change. As such, her goal is to continue with weight loss efforts. She has agreed to the Category 1 Plan.   Exercise goals: As is.  Behavioral modification strategies: increasing lean protein intake.  Jennifer [name] has agreed to follow-up with our clinic in 3 to 4 weeks. She was informed of the importance of frequent follow-up visits to maximize her success with intensive lifestyle modifications for her multiple health conditions.   Jennifer [name] was informed we would discuss her lab results at her next visit unless there is a critical issue that needs to be addressed sooner. Jennifer [name] agreed to keep her next visit at the agreed upon time to discuss these results.  Objective:   Blood pressure 120/67, pulse (!) 59, temperature 97.6 F (36.4 C),  temperature source Oral, height 5\' 3"  (1.6 m), weight 159 lb (72.1 kg), SpO2 98 %. Body mass index is 28.17 kg/m.  General: Cooperative, alert, well developed, in no acute distress. HEENT: Conjunctivae and lids unremarkable. Cardiovascular: Regular rhythm.  Lungs: Normal work of breathing. Neurologic: No focal deficits.    Lab Results  Component Value Date   CREATININE 0.80 05/05/2019   BUN 20 05/05/2019   NA 135 05/05/2019   K 4.5 05/05/2019   CL 98 05/05/2019   CO2 22 05/05/2019   Lab Results  Component Value Date   ALT 35 (H) 05/05/2019   AST 23 05/05/2019   ALKPHOS 117 05/05/2019   BILITOT 0.5 05/05/2019   Lab Results  Component Value Date   HGBA1C 5.4 05/05/2019   HGBA1C 5.4 12/14/2018   HGBA1C 5.4 08/25/2018   HGBA1C 5.2 09/16/2017   HGBA1C 5.4 03/20/2017   Lab Results  Component Value Date   INSULIN 13.5 05/05/2019   INSULIN 12.8 12/14/2018   INSULIN 14.7 08/25/2018   Lab Results  Component Value Date   TSH 2.470 05/05/2019   Lab Results  Component Value Date   CHOL 174 05/05/2019   HDL 50 05/05/2019   LDLCALC 101 (H) 05/05/2019   LDLDIRECT 96.0 05/04/2018   TRIG 131 05/05/2019   CHOLHDL 3.5 05/05/2019   Lab Results  Component Value Date   WBC 15.6 (H) 07/21/2018   HGB 11.0 (L) 07/21/2018   HCT 33.5 (L) 07/21/2018   MCV 95.7 07/21/2018   PLT 276 07/21/2018   No results found for: IRON, TIBC, FERRITIN  Obesity Behavioral Intervention Documentation for Insurance:   Approximately 15 minutes were spent on the discussion below.  ASK: We discussed the diagnosis of obesity with Jennifer [name] today and Jennifer [name] agreed to give [place] permission to discuss obesity behavioral modification therapy today.  ASSESS: Jennifer [name] has the diagnosis of obesity and her BMI today is 28.17. Jennifer [name] is in the action stage of change.   ADVISE: Jennifer [name] was educated on the multiple health risks of obesity as well as the benefit of weight loss to improve her health. She was advised of the need for long term treatment and the importance of lifestyle modifications to improve her current health and to decrease her risk of future health problems.  AGREE: Multiple dietary modification options and treatment options were discussed and Jennifer [name] agreed to follow the recommendations documented in the above  note.  ARRANGE: Jennifer [name] was educated on the importance of frequent visits to treat obesity as outlined per CMS and USPSTF guidelines and agreed to schedule her next follow up appointment today.  Attestation Statements:   Reviewed by clinician on day of visit: allergies, medications, problem list, medical history, surgical history, family history, social history, and previous encounter notes.   I, Trixie Dredge, am acting as transcriptionist for Dennard Nip, MD.  I have reviewed the above documentation for accuracy and completeness, and I agree with the above. -  Dennard Nip, MD

## 2019-08-31 LAB — LIPID PANEL WITH LDL/HDL RATIO
Cholesterol, Total: 190 mg/dL (ref 100–199)
HDL: 58 mg/dL (ref >39)
LDL Chol Calc (NIH): 112 mg/dL — ABNORMAL HIGH (ref 0–99)
LDL/HDL Ratio: 1.9 ratio (ref 0.0–3.2)
Triglycerides: 110 mg/dL (ref 0–149)
VLDL Cholesterol Cal: 20 mg/dL (ref 5–40)

## 2019-08-31 LAB — COMPREHENSIVE METABOLIC PANEL
ALT: 45 IU/L — ABNORMAL HIGH (ref 0–32)
ALT: 46 IU/L — ABNORMAL HIGH (ref 0–32)
AST: 29 IU/L (ref 0–40)
AST: 29 IU/L (ref 0–40)
Albumin/Globulin Ratio: 2 (ref 1.2–2.2)
Albumin/Globulin Ratio: 2.1 (ref 1.2–2.2)
Albumin: 4.7 g/dL (ref 3.8–4.8)
Albumin: 4.7 g/dL (ref 3.8–4.8)
Alkaline Phosphatase: 101 IU/L (ref 48–121)
Alkaline Phosphatase: 104 IU/L (ref 48–121)
BUN/Creatinine Ratio: 24 (ref 12–28)
BUN/Creatinine Ratio: 26 (ref 12–28)
BUN: 17 mg/dL (ref 8–27)
BUN: 18 mg/dL (ref 8–27)
Bilirubin Total: 0.4 mg/dL (ref 0.0–1.2)
Bilirubin Total: 0.4 mg/dL (ref 0.0–1.2)
CO2: 22 mmol/L (ref 20–29)
CO2: 22 mmol/L (ref 20–29)
Calcium: 10 mg/dL (ref 8.7–10.3)
Calcium: 9.8 mg/dL (ref 8.7–10.3)
Chloride: 100 mmol/L (ref 96–106)
Chloride: 101 mmol/L (ref 96–106)
Creatinine, Ser: 0.7 mg/dL (ref 0.57–1.00)
Creatinine, Ser: 0.72 mg/dL (ref 0.57–1.00)
GFR calc Af Amer: 100 mL/min/{1.73_m2} (ref >59)
GFR calc Af Amer: 103 mL/min/{1.73_m2} (ref >59)
GFR calc non Af Amer: 86 mL/min/{1.73_m2} (ref >59)
GFR calc non Af Amer: 89 mL/min/{1.73_m2} (ref >59)
Globulin, Total: 2.2 g/dL (ref 1.5–4.5)
Globulin, Total: 2.3 g/dL (ref 1.5–4.5)
Glucose: 79 mg/dL (ref 65–99)
Glucose: 80 mg/dL (ref 65–99)
Potassium: 4.7 mmol/L (ref 3.5–5.2)
Potassium: 4.8 mmol/L (ref 3.5–5.2)
Sodium: 137 mmol/L (ref 134–144)
Sodium: 138 mmol/L (ref 134–144)
Total Protein: 6.9 g/dL (ref 6.0–8.5)
Total Protein: 7 g/dL (ref 6.0–8.5)

## 2019-08-31 LAB — VITAMIN D 25 HYDROXY (VIT D DEFICIENCY, FRACTURES): Vit D, 25-Hydroxy: 54.5 ng/mL (ref 30.0–100.0)

## 2019-08-31 LAB — T3: T3, Total: 69 ng/dL — ABNORMAL LOW (ref 71–180)

## 2019-08-31 LAB — T4, FREE: Free T4: 1.16 ng/dL (ref 0.82–1.77)

## 2019-08-31 LAB — TSH: TSH: 3.89 u[IU]/mL (ref 0.450–4.500)

## 2019-09-01 ENCOUNTER — Ambulatory Visit: Payer: PRIVATE HEALTH INSURANCE | Admitting: Psychology

## 2019-09-15 ENCOUNTER — Ambulatory Visit (INDEPENDENT_AMBULATORY_CARE_PROVIDER_SITE_OTHER): Payer: Medicare Other | Admitting: Psychology

## 2019-09-15 DIAGNOSIS — F320 Major depressive disorder, single episode, mild: Secondary | ICD-10-CM | POA: Diagnosis not present

## 2019-09-20 ENCOUNTER — Ambulatory Visit (INDEPENDENT_AMBULATORY_CARE_PROVIDER_SITE_OTHER): Payer: Medicare Other | Admitting: Family Medicine

## 2019-09-20 ENCOUNTER — Other Ambulatory Visit: Payer: Self-pay

## 2019-09-20 ENCOUNTER — Encounter: Payer: Self-pay | Admitting: Family Medicine

## 2019-09-20 ENCOUNTER — Encounter (INDEPENDENT_AMBULATORY_CARE_PROVIDER_SITE_OTHER): Payer: Self-pay | Admitting: Family Medicine

## 2019-09-20 VITALS — BP 131/78 | HR 67 | Temp 98.0°F | Ht 63.0 in | Wt 156.0 lb

## 2019-09-20 DIAGNOSIS — E669 Obesity, unspecified: Secondary | ICD-10-CM

## 2019-09-20 DIAGNOSIS — E038 Other specified hypothyroidism: Secondary | ICD-10-CM

## 2019-09-20 DIAGNOSIS — Z6830 Body mass index (BMI) 30.0-30.9, adult: Secondary | ICD-10-CM

## 2019-09-20 DIAGNOSIS — E782 Mixed hyperlipidemia: Secondary | ICD-10-CM

## 2019-09-20 DIAGNOSIS — R7989 Other specified abnormal findings of blood chemistry: Secondary | ICD-10-CM | POA: Diagnosis not present

## 2019-09-20 NOTE — Progress Notes (Signed)
Chief Complaint:   OBESITY Jennifer [name] is here to discuss her progress with her obesity treatment plan along with follow-up of her obesity related diagnoses. Jennifer [name] is on the Category 1 Plan and states she is following her eating plan approximately 85% of the time. Jennifer [name] states she is exercising with Jennifer [name] videos 5k for 45 minutes 3 times per week.  Today's visit was #: 81 Starting weight: 208 lbs Starting date: 08/25/2018 Today's weight: 156 lbs Today's date: 09/20/2019 Total lbs lost to date: 52 Total lbs lost since last in-office visit: 3  Interim History: Jennifer [name] continues to do well with weight loss on her Category 1 plan even in vacation. She indulged a bitbut she was able to portion control well and continued to exercise regularly. She feels well overall.  Subjective:   1. Elevated LFTs Jennifer [name]'s ALT is slightly elevated even with weight loss. She denies abdominal pain or jaundice. She denies excessive Tylenol or drinking much ETOH.  2. Mixed hyperlipidemia Jennifer [name]'s HDL and LDL are at goal, but her her LDL is still above goal. She denies chest pain and she is on Crestor regularly.  3. Other specified hypothyroidism Jennifer [name]'s TSH is at goal, and T3 is slightly low. She denies change in fatigue or hot/cold intolerance.   Assessment/Plan:   1. Elevated LFTs We discussed the likely diagnosis of non-alcoholic fatty liver disease today and how this condition is obesity related. Jennifer [name] was educated the importance of weight loss. She will discuss with her primary care provider to see if further evaluation needs to be done. Jennifer [name] agreed to continue with her weight loss efforts with healthier diet and exercise as an essential part of her treatment plan.   2. Mixed hyperlipidemia Cardiovascular risk and specific lipid/LDL goals reviewed. We discussed several lifestyle modifications today. Jennifer [name] will continue Crestor as is, and will continue to work on diet, exercise  and weight loss efforts. Orders and follow up as documented in patient record.   3. Other specified hypothyroidism Jennifer [name] is with long-standing hypothyroidism, and she will continue Synthroid as is. She appears euthyroid. We will recheck labs in 3 months. Orders and follow up as documented in patient record.  4. Class 1 obesity with serious comorbidity and body mass index (BMI) of 30.0 to 30.9 in adult, unspecified obesity type Jennifer [name] is currently in the action stage of change. As such, her goal is to continue with weight loss efforts. She has agreed to the Category 1 Plan.   Exercise goals: As is.  Behavioral modification strategies: increasing lean protein intake.  Jennifer [name] has agreed to follow-up with our clinic in 4 weeks. She was informed of the importance of frequent follow-up visits to maximize her success with intensive lifestyle modifications for her multiple health conditions.   Objective:   Blood pressure 131/78, pulse 67, temperature 98 F (36.7 C), height 5\' 3"  (1.6 m), weight 156 lb (70.8 kg), SpO2 96 %. Body mass index is 27.63 kg/m.  General: Cooperative, alert, well developed, in no acute distress. HEENT: Conjunctivae and lids unremarkable. Cardiovascular: Regular rhythm.  Lungs: Normal work of breathing. Neurologic: No focal deficits.   Lab Results  Component Value Date   CREATININE 0.72 08/30/2019   BUN 17 08/30/2019   NA 137 08/30/2019   K 4.8 08/30/2019   CL 100 08/30/2019   CO2 22 08/30/2019   Lab Results  Component Value Date   ALT 45 (H) 08/30/2019   AST 29 08/30/2019   ALKPHOS 101 08/30/2019  BILITOT 0.4 08/30/2019   Lab Results  Component Value Date   HGBA1C 5.4 05/05/2019   HGBA1C 5.4 12/14/2018   HGBA1C 5.4 08/25/2018   HGBA1C 5.2 09/16/2017   HGBA1C 5.4 03/20/2017   Lab Results  Component Value Date   INSULIN 13.5 05/05/2019   INSULIN 12.8 12/14/2018   INSULIN 14.7 08/25/2018   Lab Results  Component Value Date   TSH 3.890  08/30/2019   Lab Results  Component Value Date   CHOL 190 08/30/2019   HDL 58 08/30/2019   LDLCALC 112 (H) 08/30/2019   LDLDIRECT 96.0 05/04/2018   TRIG 110 08/30/2019   CHOLHDL 3.5 05/05/2019   Lab Results  Component Value Date   WBC 15.6 (H) 07/21/2018   HGB 11.0 (L) 07/21/2018   HCT 33.5 (L) 07/21/2018   MCV 95.7 07/21/2018   PLT 276 07/21/2018   No results found for: IRON, TIBC, FERRITIN  Attestation Statements:   Reviewed by clinician on day of visit: allergies, medications, problem list, medical history, surgical history, family history, social history, and previous encounter notes.  Time spent on visit including pre-visit chart review and post-visit care and charting was 32 minutes.    I, Trixie Dredge, am acting as Location manager for Dennard Nip, MD.  I have reviewed the above documentation for accuracy and completeness, and I agree with the above. -  Dennard Nip, MD

## 2019-10-11 ENCOUNTER — Other Ambulatory Visit: Payer: Self-pay | Admitting: Family Medicine

## 2019-10-11 DIAGNOSIS — E782 Mixed hyperlipidemia: Secondary | ICD-10-CM

## 2019-10-11 DIAGNOSIS — E119 Type 2 diabetes mellitus without complications: Secondary | ICD-10-CM

## 2019-10-18 ENCOUNTER — Ambulatory Visit (INDEPENDENT_AMBULATORY_CARE_PROVIDER_SITE_OTHER): Payer: Medicare Other | Admitting: Family Medicine

## 2019-10-25 ENCOUNTER — Encounter (INDEPENDENT_AMBULATORY_CARE_PROVIDER_SITE_OTHER): Payer: Self-pay | Admitting: Family Medicine

## 2019-10-25 ENCOUNTER — Ambulatory Visit: Payer: Medicare Other | Admitting: Family Medicine

## 2019-10-25 ENCOUNTER — Ambulatory Visit (INDEPENDENT_AMBULATORY_CARE_PROVIDER_SITE_OTHER): Payer: Medicare Other | Admitting: Family Medicine

## 2019-10-25 ENCOUNTER — Other Ambulatory Visit: Payer: Self-pay

## 2019-10-25 VITALS — BP 157/78 | HR 58 | Temp 97.7°F | Ht 63.0 in | Wt 160.0 lb

## 2019-10-25 DIAGNOSIS — Z6830 Body mass index (BMI) 30.0-30.9, adult: Secondary | ICD-10-CM | POA: Diagnosis not present

## 2019-10-25 DIAGNOSIS — I10 Essential (primary) hypertension: Secondary | ICD-10-CM | POA: Diagnosis not present

## 2019-10-25 DIAGNOSIS — E669 Obesity, unspecified: Secondary | ICD-10-CM | POA: Diagnosis not present

## 2019-10-26 DIAGNOSIS — L600 Ingrowing nail: Secondary | ICD-10-CM | POA: Diagnosis not present

## 2019-10-26 DIAGNOSIS — M79674 Pain in right toe(s): Secondary | ICD-10-CM | POA: Diagnosis not present

## 2019-10-29 DIAGNOSIS — Z23 Encounter for immunization: Secondary | ICD-10-CM | POA: Diagnosis not present

## 2019-11-01 ENCOUNTER — Encounter: Payer: Self-pay | Admitting: Family Medicine

## 2019-11-01 ENCOUNTER — Other Ambulatory Visit: Payer: Self-pay

## 2019-11-01 ENCOUNTER — Ambulatory Visit (INDEPENDENT_AMBULATORY_CARE_PROVIDER_SITE_OTHER): Payer: Medicare Other | Admitting: Family Medicine

## 2019-11-01 VITALS — BP 154/78 | HR 60 | Temp 97.1°F | Ht 63.0 in | Wt 164.2 lb

## 2019-11-01 DIAGNOSIS — Z23 Encounter for immunization: Secondary | ICD-10-CM | POA: Diagnosis not present

## 2019-11-01 DIAGNOSIS — R7989 Other specified abnormal findings of blood chemistry: Secondary | ICD-10-CM

## 2019-11-01 DIAGNOSIS — I10 Essential (primary) hypertension: Secondary | ICD-10-CM

## 2019-11-01 DIAGNOSIS — N9089 Other specified noninflammatory disorders of vulva and perineum: Secondary | ICD-10-CM

## 2019-11-01 LAB — CBC WITH DIFFERENTIAL/PLATELET
Basophils Absolute: 0 10*3/uL (ref 0.0–0.1)
Basophils Relative: 1.2 % (ref 0.0–3.0)
Eosinophils Absolute: 0.1 10*3/uL (ref 0.0–0.7)
Eosinophils Relative: 2.1 % (ref 0.0–5.0)
HCT: 37.8 % (ref 36.0–46.0)
Hemoglobin: 13 g/dL (ref 12.0–15.0)
Lymphocytes Relative: 23.1 % (ref 12.0–46.0)
Lymphs Abs: 0.9 10*3/uL (ref 0.7–4.0)
MCHC: 34.5 g/dL (ref 30.0–36.0)
MCV: 89.9 fl (ref 78.0–100.0)
Monocytes Absolute: 0.5 10*3/uL (ref 0.1–1.0)
Monocytes Relative: 13.3 % — ABNORMAL HIGH (ref 3.0–12.0)
Neutro Abs: 2.4 10*3/uL (ref 1.4–7.7)
Neutrophils Relative %: 60.3 % (ref 43.0–77.0)
Platelets: 311 10*3/uL (ref 150.0–400.0)
RBC: 4.21 Mil/uL (ref 3.87–5.11)
RDW: 12.4 % (ref 11.5–15.5)
WBC: 4 10*3/uL (ref 4.0–10.5)

## 2019-11-01 NOTE — Progress Notes (Signed)
   Subjective:    Patient ID: Jennifer [name], female    DOB: [birth year], 69 y.o.   MRN: [id number]  HPI Chief Complaint  Patient presents with  . Follow-up    lab work   Elevated ALT, checked at [facility]. Has been going up slightly this year.  Was normal last year.  Hot flashes- has at bedtime, usually around midnight.  Does not interfere with her sleep or bother her enough to consider medication.  Always thirsty- drinks a lot of diet pepsi-6 bottles a day. Not sure if more of a habit.  Good urine output.  Known regular nocturia.  Has been advised to wean artificial sweeteners by provider at healthy weight management.  Also drinks water and ice tea.  Labial irritation- no rash, just feels irritated. Uses Vagisil moisturizer, this helped in the past but does not seem to help as much recently.  Not sure if worse with sitting or walking. No new soaps/ detergents, wears cotton underwear.   Review of Systems    Per HPI Objective:   Physical Exam Physical Exam  Vitals reviewed. Constitutional: Oriented to person, place, and time. Appears well-developed and well-nourished.  HENT:  Head: Normocephalic and atraumatic.  Eyes: Conjunctivae are normal.  Neck: Normal range of motion. Neck supple.  Cardiovascular: Normal rate.   Pulmonary/Chest: Effort normal.  Genitourinary: Labia with mild erythema, no rash, no discharge Musculoskeletal: Normal range of motion.  Neurological: Alert and oriented to person, place, and time.  Psychiatric: Normal mood and affect. Behavior is normal. Judgment and thought content normal.      BP (!) 154/78   Pulse 60   Temp (!) 97.1 F (36.2 C) (Temporal)   Ht 5' 3" (1.6 m)   Wt 164 lb 4 oz (74.5 kg)   LMP  (LMP Unknown)   SpO2 96%   BMI 29.10 kg/m  Wt Readings from Last 3 Encounters:  11/01/19 164 lb 4 oz (74.5 kg)  10/25/19 160 lb (72.6 kg)  09/20/19 156 lb (70.8 kg)   BP Readings from Last 3 Encounters:  11/01/19 (!) 154/78  10/25/19 (!)  157/78  09/20/19 131/78       Assessment & Plan:  1. Elevated LFTs -We will check labs, if no source, will get ultrasound of liver - Hepatitis B surface antigen - Hepatitis B surface antibody,quantitative - Hepatitis B core antibody, IgM - Hepatitis C antibody - Iron and TIBC - CBC with Differential  2. Essential hypertension -Discussed things that may be contributing to elevated blood pressure readings including caffeine, and adequate water intake.  Discussed weaning her diet sodas, increasing water -She is going to purchase home blood pressure monitoring kit and was provided instructions for checking her blood pressure -She will send me her readings in 2 weeks and if still elevated, will increase her losartan  3. Labial irritation -Discussed switching soaps to a no fragrance bar that does not have antibacterial properties -Can try once daily over-the-counter hydrocortisone 1% -Follow-up if no improvement  This visit occurred during the SARS-CoV-2 public health emergency.  Safety protocols were in place, including screening questions prior to the visit, additional usage of staff PPE, and extensive cleaning of exam room while observing appropriate contact time as indicated for disinfecting solutions.     [name], FNP-BC  [facility]  11/01/2019 11:03 AM

## 2019-11-01 NOTE — Progress Notes (Signed)
Chief Complaint:   OBESITY Jennifer [name] is here to discuss her progress with her obesity treatment plan along with follow-up of her obesity related diagnoses. Jennifer [name] is on the Category 1 Plan and states she is following her eating plan approximately 50% of the time. Jennifer [name] states she is exercising with walking tapes for 45 minutes 2 times per week.  Today's visit was #: 20 Starting weight: 208 lbs Starting date: 08/25/2018 Today's weight: 160 lbs Today's date: 10/25/2019 Total lbs lost to date: 48 Total lbs lost since last in-office visit: 0  Interim History: Jennifer [name] has done a lot of traveling and has had a lot more celebration eating. She is looking forward to getting back to following her plan and exercise routine.  Subjective:   1. Essential hypertension Jennifer [name] blood pressure is elevated, but is normally very well controlled. She denies chest pain or headache. She is stable on Cozaar.  Assessment/Plan:   1. Essential hypertension Jennifer [name] will continue her medications, diet, exercise, and healthy weight loss to improve blood pressure control. We will watch for signs of hypotension as she continues her lifestyle modifications. We will recheck her blood pressure in 4 weeks.  2. Class 1 obesity with serious comorbidity and body mass index (BMI) of 30.0 to 30.9 in adult, unspecified obesity type Jennifer [name] is currently in the action stage of change. As such, her goal is to continue with weight loss efforts. She has agreed to the Category 1 Plan.   Exercise goals: As is.  Behavioral modification strategies: meal planning and cooking strategies.  Jennifer [name] has agreed to follow-up with our clinic in 4 weeks. She was informed of the importance of frequent follow-up visits to maximize her success with intensive lifestyle modifications for her multiple health conditions.   Objective:   Blood pressure (!) 157/78, pulse (!) 58, temperature 97.7 F (36.5 C), height 5\' 3"  (1.6 m), weight  160 lb (72.6 kg), SpO2 98 %. Body mass index is 28.34 kg/m.  General: Cooperative, alert, well developed, in no acute distress. HEENT: Conjunctivae and lids unremarkable. Cardiovascular: Regular rhythm.  Lungs: Normal work of breathing. Neurologic: No focal deficits.   Lab Results  Component Value Date   CREATININE 0.72 08/30/2019   BUN 17 08/30/2019   NA 137 08/30/2019   K 4.8 08/30/2019   CL 100 08/30/2019   CO2 22 08/30/2019   Lab Results  Component Value Date   ALT 45 (H) 08/30/2019   AST 29 08/30/2019   ALKPHOS 101 08/30/2019   BILITOT 0.4 08/30/2019   Lab Results  Component Value Date   HGBA1C 5.4 05/05/2019   HGBA1C 5.4 12/14/2018   HGBA1C 5.4 08/25/2018   HGBA1C 5.2 09/16/2017   HGBA1C 5.4 03/20/2017   Lab Results  Component Value Date   INSULIN 13.5 05/05/2019   INSULIN 12.8 12/14/2018   INSULIN 14.7 08/25/2018   Lab Results  Component Value Date   TSH 3.890 08/30/2019   Lab Results  Component Value Date   CHOL 190 08/30/2019   HDL 58 08/30/2019   LDLCALC 112 (H) 08/30/2019   LDLDIRECT 96.0 05/04/2018   TRIG 110 08/30/2019   CHOLHDL 3.5 05/05/2019   Lab Results  Component Value Date   WBC 4.0 11/01/2019   HGB 13.0 11/01/2019   HCT 37.8 11/01/2019   MCV 89.9 11/01/2019   PLT 311.0 11/01/2019   No results found for: IRON, TIBC, FERRITIN  Attestation Statements:   Reviewed by clinician on day of visit: allergies, medications, problem  list, medical history, surgical history, family history, social history, and previous encounter notes.  Time spent on visit including pre-visit chart review and post-visit care and charting was 22 minutes.    I, Trixie Dredge, am acting as transcriptionist for Dennard Nip, MD.  I have reviewed the above documentation for accuracy and completeness, and I agree with the above. -  Dennard Nip, MD

## 2019-11-01 NOTE — Patient Instructions (Addendum)
Please take your blood pressure 2x/ week and keep a log, send me your readings in 2 weeks, next step will be to increase losartan to maximum dose Look for youtube videos by Dr. Luis Abed, Dr. Leeanne Rio on artificial sweetener  Dove or Purpose bar without antibacterial Use hydrocortisone 1% once a day  Schedule nurse visit for pneumococcal 23 2 weeks after your booster  How to Take Your Blood Pressure You can take your blood pressure at home with a machine. You may need to check your blood pressure at home:  To check if you have high blood pressure (hypertension).  To check your blood pressure over time.  To make sure your blood pressure medicine is working. Supplies needed: You will need a blood pressure machine, or monitor. You can buy one at a drugstore or online. When choosing one:  Choose one with an arm cuff.  Choose one that wraps around your upper arm. Only one finger should fit between your arm and the cuff.  Do not choose one that measures your blood pressure from your wrist or finger. Your doctor can suggest a monitor. How to prepare Avoid these things for 30 minutes before checking your blood pressure:  Drinking caffeine.  Drinking alcohol.  Eating.  Smoking.  Exercising. Five minutes before checking your blood pressure:  Pee.  Sit in a dining chair. Avoid sitting in a soft couch or armchair.  Be quiet. Do not talk. How to take your blood pressure Follow the instructions that came with your machine. If you have a digital blood pressure monitor, these may be the instructions: 1. Sit up straight. 2. Place your feet on the floor. Do not cross your ankles or legs. 3. Rest your left arm at the level of your heart. You may rest it on a table, desk, or chair. 4. Pull up your shirt sleeve. 5. Wrap the blood pressure cuff around the upper part of your left arm. The cuff should be 1 inch (2.5 cm) above your elbow. It is best to wrap the cuff around bare skin. 6. Fit the  cuff snugly around your arm. You should be able to place only one finger between the cuff and your arm. 7. Put the cord inside the groove of your elbow. 8. Press the power button. 9. Sit quietly while the cuff fills with air and loses air. 10. Write down the numbers on the screen. 11. Wait 2-3 minutes and then repeat steps 1-10. What do the numbers mean? Two numbers make up your blood pressure. The first number is called systolic pressure. The second is called diastolic pressure. An example of a blood pressure reading is "120 over 80" (or 120/80). If you are an adult and do not have a medical condition, use this guide to find out if your blood pressure is normal: Normal  First number: below 120.  Second number: below 80. Elevated  First number: 120-129.  Second number: below 80. Hypertension stage 1  First number: 130-139.  Second number: 80-89. Hypertension stage 2  First number: 140 or above.  Second number: 68 or above. Your blood pressure is above normal even if only the top or bottom number is above normal. Follow these instructions at home:  Check your blood pressure as often as your doctor tells you to.  Take your monitor to your next doctor's appointment. Your doctor will: ? Make sure you are using it correctly. ? Make sure it is working right.  Make sure you understand what your  blood pressure numbers should be.  Tell your doctor if your medicines are causing side effects. Contact a doctor if:  Your blood pressure keeps being high. Get help right away if:  Your first blood pressure number is higher than 180.  Your second blood pressure number is higher than 120. This information is not intended to replace advice given to you by your health care provider. Make sure you discuss any questions you have with your health care provider. Document Revised: 12/06/2016 Document Reviewed: 06/02/2015 Elsevier Patient Education  2020 [employer].

## 2019-11-02 ENCOUNTER — Encounter: Payer: Self-pay | Admitting: Family Medicine

## 2019-11-02 LAB — IRON, TOTAL/TOTAL IRON BINDING CAP
%SAT: 29 % (calc) (ref 16–45)
Iron: 110 ug/dL (ref 45–160)
TIBC: 382 mcg/dL (calc) (ref 250–450)

## 2019-11-02 LAB — HEPATITIS B SURFACE ANTIBODY, QUANTITATIVE: Hep B S AB Quant (Post): <5 m[IU]/mL — ABNORMAL LOW (ref >=10)

## 2019-11-02 LAB — HEPATITIS B CORE ANTIBODY, IGM: Hep B C IgM: NONREACTIVE

## 2019-11-02 LAB — HEPATITIS C ANTIBODY
Hepatitis C Ab: NONREACTIVE
SIGNAL TO CUT-OFF: 0.01 (ref <1.00)

## 2019-11-02 LAB — HEPATITIS B SURFACE ANTIGEN: Hepatitis B Surface Ag: NONREACTIVE

## 2019-11-02 NOTE — Addendum Note (Signed)
Addended by: [name] B on: 11/02/2019 01:26 PM   Modules accepted: Orders

## 2019-11-10 ENCOUNTER — Ambulatory Visit (INDEPENDENT_AMBULATORY_CARE_PROVIDER_SITE_OTHER): Payer: Medicare Other | Admitting: Psychology

## 2019-11-10 DIAGNOSIS — F320 Major depressive disorder, single episode, mild: Secondary | ICD-10-CM

## 2019-11-12 ENCOUNTER — Ambulatory Visit
Admission: RE | Admit: 2019-11-12 | Discharge: 2019-11-12 | Disposition: A | Discharge status: Home / Self Care | Payer: Medicare Other | Source: Ambulatory Visit | Attending: Family Medicine | Admitting: Family Medicine

## 2019-11-12 DIAGNOSIS — R7989 Other specified abnormal findings of blood chemistry: Secondary | ICD-10-CM

## 2019-11-16 ENCOUNTER — Encounter (INDEPENDENT_AMBULATORY_CARE_PROVIDER_SITE_OTHER): Payer: Self-pay | Admitting: Family Medicine

## 2019-11-16 NOTE — Telephone Encounter (Signed)
Dr.Beasley 

## 2019-11-17 ENCOUNTER — Encounter: Payer: Self-pay | Admitting: Family Medicine

## 2019-11-17 ENCOUNTER — Encounter: Payer: Self-pay | Admitting: Neurology

## 2019-11-17 ENCOUNTER — Ambulatory Visit (INDEPENDENT_AMBULATORY_CARE_PROVIDER_SITE_OTHER): Payer: Medicare Other | Admitting: Neurology

## 2019-11-17 VITALS — BP 155/80 | HR 66 | Ht 63.0 in | Wt 165.0 lb

## 2019-11-17 DIAGNOSIS — Z9989 Dependence on other enabling machines and devices: Secondary | ICD-10-CM | POA: Diagnosis not present

## 2019-11-17 DIAGNOSIS — G4733 Obstructive sleep apnea (adult) (pediatric): Secondary | ICD-10-CM
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
## 2019-11-17 NOTE — Progress Notes (Signed)
Order for cpap supplies sent to [facility] via community message. Confirmation received that the order transmitted was successful.  

## 2019-11-17 NOTE — Patient Instructions (Addendum)
It was good to see you again, as always.  Congratulations on your weight loss success, you look great!  As discussed, you may be able to get a better seal with a different style of mask, please stop in the sleep lab to meet with Robin, our sleep lab manager to talk about a different type of mask, such as the DreamWear full facemask or ResMed F 30i  Keep up the good work! I will see you back in one year.

## 2019-11-22 ENCOUNTER — Encounter (INDEPENDENT_AMBULATORY_CARE_PROVIDER_SITE_OTHER): Payer: Self-pay | Admitting: Family Medicine

## 2019-11-22 ENCOUNTER — Ambulatory Visit (INDEPENDENT_AMBULATORY_CARE_PROVIDER_SITE_OTHER): Payer: Medicare Other | Admitting: Family Medicine

## 2019-11-22 ENCOUNTER — Other Ambulatory Visit: Payer: Self-pay

## 2019-11-22 VITALS — BP 136/70 | HR 64 | Temp 97.5°F | Ht 63.0 in | Wt 157.0 lb

## 2019-11-22 DIAGNOSIS — K760 Fatty (change of) liver, not elsewhere classified: Secondary | ICD-10-CM | POA: Diagnosis not present

## 2019-11-22 DIAGNOSIS — E669 Obesity, unspecified: Secondary | ICD-10-CM | POA: Diagnosis not present

## 2019-11-22 DIAGNOSIS — F3289 Other specified depressive episodes: Secondary | ICD-10-CM

## 2019-11-22 DIAGNOSIS — Z6830 Body mass index (BMI) 30.0-30.9, adult: Secondary | ICD-10-CM

## 2019-11-23 NOTE — Progress Notes (Signed)
Chief Complaint:   OBESITY Jennifer [name] is here to discuss her progress with her obesity treatment plan along with follow-up of her obesity related diagnoses. Jennifer [name] is on the Category 1 Plan and states she is following her eating plan approximately 80% of the time. Jennifer [name] states she is doing 0 minutes 0 times per week.  Today's visit was #: 21 Starting weight: 208 lbs Starting date: 08/25/2018 Today's weight: 157 lbs Today's date: 11/22/2019 Total lbs lost to date: 51 Total lbs lost since last in-office visit: 3  Interim History: Jennifer [name] continues to do well with weight loss. She had a foot injury which has decreased her exercise. She notes being hard on herself most of her life concerning her weight, and she realizes too much of her self-esteem is related to her size which isn't healthy.  Subjective:   1. NAFLD (nonalcoholic fatty liver disease) Jennifer [name] has mildly elevated ALT, and she has concerns about what this is and how this affects her health.  2. Other depression Jennifer [name] has been very introspective recently and her mood appears to be stable. She notes increased family drama over the holidays. She does not note any problem with Celexa.  Assessment/Plan:   1. NAFLD (nonalcoholic fatty liver disease) We discussed the likely diagnosis of non-alcoholic fatty liver disease today and how this condition is obesity related. Jennifer [name] was educated the importance of weight loss. Jennifer [name] agreed to continue with her weight loss efforts with healthier diet and exercise as an essential part of her treatment plan.  2. Other depression Behavior modification techniques were discussed today to help Jennifer [name] deal with her emotional/non-hunger eating behaviors. Jennifer [name] will continue Celexa as is, and she was encouraged to keep working on her emotional eating behaviors. Orders and follow up as documented in patient record.   3. Class 1 obesity with serious comorbidity and body mass index (BMI) of  30.0 to 30.9 in adult, unspecified obesity type Jennifer [name] is currently in the action stage of change. As such, her goal is to continue with weight loss efforts. She has agreed to the Category 1 Plan.   Exercise goals: Add strength exercises.  Behavioral modification strategies: meal planning and cooking strategies and holiday eating strategies .  Jennifer [name] has agreed to follow-up with our clinic in 4 weeks. She was informed of the importance of frequent follow-up visits to maximize her success with intensive lifestyle modifications for her multiple health conditions.   Objective:   Blood pressure 136/70, pulse 64, temperature (!) 97.5 F (36.4 C), height 5\' 3"  (1.6 m), weight 157 lb (71.2 kg), SpO2 95 %. Body mass index is 27.81 kg/m.  General: Cooperative, alert, well developed, in no acute distress. HEENT: Conjunctivae and lids unremarkable. Cardiovascular: Regular rhythm.  Lungs: Normal work of breathing. Neurologic: No focal deficits.   Lab Results  Component Value Date   CREATININE 0.72 08/30/2019   BUN 17 08/30/2019   NA 137 08/30/2019   K 4.8 08/30/2019   CL 100 08/30/2019   CO2 22 08/30/2019   Lab Results  Component Value Date   ALT 45 (H) 08/30/2019   AST 29 08/30/2019   ALKPHOS 101 08/30/2019   BILITOT 0.4 08/30/2019   Lab Results  Component Value Date   HGBA1C 5.4 05/05/2019   HGBA1C 5.4 12/14/2018   HGBA1C 5.4 08/25/2018   HGBA1C 5.2 09/16/2017   HGBA1C 5.4 03/20/2017   Lab Results  Component Value Date   INSULIN 13.5 05/05/2019   INSULIN 12.8 12/14/2018  INSULIN 14.7 08/25/2018   Lab Results  Component Value Date   TSH 3.890 08/30/2019   Lab Results  Component Value Date   CHOL 190 08/30/2019   HDL 58 08/30/2019   LDLCALC 112 (H) 08/30/2019   LDLDIRECT 96.0 05/04/2018   TRIG 110 08/30/2019   CHOLHDL 3.5 05/05/2019   Lab Results  Component Value Date   WBC 4.0 11/01/2019   HGB 13.0 11/01/2019   HCT 37.8 11/01/2019   MCV 89.9 11/01/2019    PLT 311.0 11/01/2019   Lab Results  Component Value Date   IRON 110 11/01/2019   TIBC 382 11/01/2019   Attestation Statements:   Reviewed by clinician on day of visit: allergies, medications, problem list, medical history, surgical history, family history, social history, and previous encounter notes.  Time spent on visit including pre-visit chart review and post-visit care and charting was 33 minutes.    I, Trixie Dredge, am acting as transcriptionist for Dennard Nip, MD.  I have reviewed the above documentation for accuracy and completeness, and I agree with the above. -  Dennard Nip, MD

## 2019-12-06 ENCOUNTER — Other Ambulatory Visit: Payer: Self-pay | Admitting: Family Medicine

## 2019-12-06 DIAGNOSIS — F329 Major depressive disorder, single episode, unspecified: Secondary | ICD-10-CM

## 2019-12-06 DIAGNOSIS — E119 Type 2 diabetes mellitus without complications: Secondary | ICD-10-CM

## 2019-12-06 DIAGNOSIS — I10 Essential (primary) hypertension: Secondary | ICD-10-CM

## 2019-12-06 NOTE — Telephone Encounter (Signed)
Pharmacy requests refill on: Losartan Potassium 25 mg   LAST REFILL: 06/16/2019 (Q-180, R-1) LAST OV: 11/01/2019 NEXT OV: Not Scheduled  PHARMACY: [facility] [place], [place] requests refill on: Citalopram HBR 40 mg   LAST REFILL: 06/17/2019 (Q-90, R-1) LAST OV: 11/01/2019 NEXT OV: Not Scheduled  PHARMACY: [facility] [place], [place]

## 2019-12-08 ENCOUNTER — Ambulatory Visit (INDEPENDENT_AMBULATORY_CARE_PROVIDER_SITE_OTHER): Payer: Medicare Other | Admitting: Psychology

## 2019-12-08 DIAGNOSIS — F320 Major depressive disorder, single episode, mild: Secondary | ICD-10-CM | POA: Diagnosis not present

## 2019-12-10 ENCOUNTER — Encounter: Payer: Self-pay | Admitting: Family Medicine

## 2019-12-19 DIAGNOSIS — Z23 Encounter for immunization: Secondary | ICD-10-CM | POA: Diagnosis not present

## 2019-12-20 ENCOUNTER — Encounter (INDEPENDENT_AMBULATORY_CARE_PROVIDER_SITE_OTHER): Payer: Self-pay | Admitting: Family Medicine

## 2019-12-20 ENCOUNTER — Other Ambulatory Visit: Payer: Self-pay

## 2019-12-20 ENCOUNTER — Encounter: Payer: Self-pay | Admitting: Family Medicine

## 2019-12-20 ENCOUNTER — Ambulatory Visit (INDEPENDENT_AMBULATORY_CARE_PROVIDER_SITE_OTHER): Payer: Medicare Other | Admitting: Family Medicine

## 2019-12-20 VITALS — BP 146/77 | HR 98 | Temp 98.0°F | Ht 63.0 in | Wt 157.0 lb

## 2019-12-20 DIAGNOSIS — E669 Obesity, unspecified: Secondary | ICD-10-CM

## 2019-12-20 DIAGNOSIS — I10 Essential (primary) hypertension: Secondary | ICD-10-CM

## 2019-12-20 DIAGNOSIS — Z6830 Body mass index (BMI) 30.0-30.9, adult: Secondary | ICD-10-CM

## 2019-12-20 NOTE — Progress Notes (Signed)
Chief Complaint:   OBESITY Jennifer [name] is here to discuss her progress with her obesity treatment plan along with follow-up of her obesity related diagnoses. Jennifer [name] is on the Category 1 Plan and states she is following her eating plan approximately 90% of the time. Jennifer [name] states she is doing 0 minutes 0 times per week.  Today's visit was #: 22 Starting weight: 208 lbs Starting date: 08/25/2018 Today's weight: 157 lbs Today's date: 12/20/2019 Total lbs lost to date: 51 Total lbs lost since last in-office visit: 0  Interim History: Jennifer [name] has done well with maintaining her weight so far over the holidays. She has been very hectic and she hasn't has as much time to exercise, and she notes she doesn't feels as well without the exercise.  Subjective:   1. Essential hypertension Jennifer [name]'s blood pressure is elevated, but it had been well controlled previously. She had stopped checking her blood pressure at home. She denies chest pain, or change in medications.  Assessment/Plan:   1. Essential hypertension Jennifer [name] is working on healthy weight loss and exercise to improve blood pressure control. We will watch for signs of hypotension as she continues her lifestyle modifications. Jennifer [name] agreed to start checking her blood pressure at home, and we will review in 3-4 weeks.  2. Class 1 obesity with serious comorbidity and body mass index (BMI) of 30.0 to 30.9 in adult, unspecified obesity type Jennifer [name] is currently in the action stage of change. As such, her goal is to continue with weight loss efforts. She has agreed to the Category 1 Plan.   Exercise goals: Jennifer [name] is to restart exercise.  Behavioral modification strategies: increasing lean protein intake and holiday eating strategies .  Jennifer [name] has agreed to follow-up with our clinic in 3 to 4 weeks. She was informed of the importance of frequent follow-up visits to maximize her success with intensive lifestyle modifications for her  multiple health conditions.   Objective:   Blood pressure (!) 146/77, pulse 98, temperature 98 F (36.7 C), height 5\' 3"  (1.6 m), weight 157 lb (71.2 kg), SpO2 97 %. Body mass index is 27.81 kg/m.  General: Cooperative, alert, well developed, in no acute distress. HEENT: Conjunctivae and lids unremarkable. Cardiovascular: Regular rhythm.  Lungs: Normal work of breathing. Neurologic: No focal deficits.   Lab Results  Component Value Date   CREATININE 0.72 08/30/2019   BUN 17 08/30/2019   NA 137 08/30/2019   K 4.8 08/30/2019   CL 100 08/30/2019   CO2 22 08/30/2019   Lab Results  Component Value Date   ALT 45 (H) 08/30/2019   AST 29 08/30/2019   ALKPHOS 101 08/30/2019   BILITOT 0.4 08/30/2019   Lab Results  Component Value Date   HGBA1C 5.4 05/05/2019   HGBA1C 5.4 12/14/2018   HGBA1C 5.4 08/25/2018   HGBA1C 5.2 09/16/2017   HGBA1C 5.4 03/20/2017   Lab Results  Component Value Date   INSULIN 13.5 05/05/2019   INSULIN 12.8 12/14/2018   INSULIN 14.7 08/25/2018   Lab Results  Component Value Date   TSH 3.890 08/30/2019   Lab Results  Component Value Date   CHOL 190 08/30/2019   HDL 58 08/30/2019   LDLCALC 112 (H) 08/30/2019   LDLDIRECT 96.0 05/04/2018   TRIG 110 08/30/2019   CHOLHDL 3.5 05/05/2019   Lab Results  Component Value Date   WBC 4.0 11/01/2019   HGB 13.0 11/01/2019   HCT 37.8 11/01/2019   MCV 89.9 11/01/2019   PLT  311.0 11/01/2019   Lab Results  Component Value Date   IRON 110 11/01/2019   TIBC 382 11/01/2019   Attestation Statements:   Reviewed by clinician on day of visit: allergies, medications, problem list, medical history, surgical history, family history, social history, and previous encounter notes.  Time spent on visit including pre-visit chart review and post-visit care and charting was 32 minutes.    I, Trixie Dredge, am acting as transcriptionist for Dennard Nip, MD.  I have reviewed the above documentation for accuracy  and completeness, and I agree with the above. -  Dennard Nip, MD

## 2020-01-17 ENCOUNTER — Ambulatory Visit (INDEPENDENT_AMBULATORY_CARE_PROVIDER_SITE_OTHER): Payer: Medicare Other | Admitting: Family Medicine

## 2020-01-17 ENCOUNTER — Other Ambulatory Visit: Payer: Self-pay

## 2020-01-17 VITALS — BP 114/69 | HR 64 | Temp 97.8°F | Ht 63.0 in | Wt 159.0 lb

## 2020-01-17 DIAGNOSIS — E7849 Other hyperlipidemia: Secondary | ICD-10-CM | POA: Diagnosis not present

## 2020-01-17 DIAGNOSIS — R739 Hyperglycemia, unspecified: Secondary | ICD-10-CM

## 2020-01-17 DIAGNOSIS — E669 Obesity, unspecified: Secondary | ICD-10-CM

## 2020-01-17 DIAGNOSIS — Z6830 Body mass index (BMI) 30.0-30.9, adult: Secondary | ICD-10-CM | POA: Diagnosis not present

## 2020-01-17 DIAGNOSIS — E559 Vitamin D deficiency, unspecified: Secondary | ICD-10-CM | POA: Diagnosis not present

## 2020-01-17 DIAGNOSIS — R7989 Other specified abnormal findings of blood chemistry: Secondary | ICD-10-CM | POA: Diagnosis not present

## 2020-01-17 DIAGNOSIS — E039 Hypothyroidism, unspecified: Secondary | ICD-10-CM | POA: Diagnosis not present

## 2020-01-18 LAB — T4, FREE: Free T4: 1.1 ng/dL (ref 0.82–1.77)

## 2020-01-18 LAB — COMPREHENSIVE METABOLIC PANEL
ALT: 26 IU/L (ref 0–32)
AST: 18 IU/L (ref 0–40)
Albumin/Globulin Ratio: 1.9 (ref 1.2–2.2)
Albumin: 4.6 g/dL (ref 3.8–4.8)
Alkaline Phosphatase: 97 IU/L (ref 44–121)
BUN/Creatinine Ratio: 32 — ABNORMAL HIGH (ref 12–28)
BUN: 24 mg/dL (ref 8–27)
Bilirubin Total: 0.4 mg/dL (ref 0.0–1.2)
CO2: 25 mmol/L (ref 20–29)
Calcium: 9.7 mg/dL (ref 8.7–10.3)
Chloride: 98 mmol/L (ref 96–106)
Creatinine, Ser: 0.74 mg/dL (ref 0.57–1.00)
GFR calc Af Amer: 96 mL/min/{1.73_m2} (ref >59)
GFR calc non Af Amer: 84 mL/min/{1.73_m2} (ref >59)
Globulin, Total: 2.4 g/dL (ref 1.5–4.5)
Glucose: 75 mg/dL (ref 65–99)
Potassium: 5.2 mmol/L (ref 3.5–5.2)
Sodium: 136 mmol/L (ref 134–144)
Total Protein: 7 g/dL (ref 6.0–8.5)

## 2020-01-18 LAB — TSH: TSH: 3.1 u[IU]/mL (ref 0.450–4.500)

## 2020-01-18 LAB — VITAMIN D 25 HYDROXY (VIT D DEFICIENCY, FRACTURES): Vit D, 25-Hydroxy: 48 ng/mL (ref 30.0–100.0)

## 2020-01-18 LAB — INSULIN, RANDOM: INSULIN: 4.7 u[IU]/mL (ref 2.6–24.9)

## 2020-01-18 LAB — LIPID PANEL WITH LDL/HDL RATIO
Cholesterol, Total: 217 mg/dL — ABNORMAL HIGH (ref 100–199)
HDL: 62 mg/dL (ref >39)
LDL Chol Calc (NIH): 136 mg/dL — ABNORMAL HIGH (ref 0–99)
LDL/HDL Ratio: 2.2 ratio (ref 0.0–3.2)
Triglycerides: 110 mg/dL (ref 0–149)
VLDL Cholesterol Cal: 19 mg/dL (ref 5–40)

## 2020-01-18 LAB — T3: T3, Total: 63 ng/dL — ABNORMAL LOW (ref 71–180)

## 2020-01-18 LAB — HEMOGLOBIN A1C
Est. average glucose Bld gHb Est-mCnc: 103 mg/dL
Hgb A1c MFr Bld: 5.2 % (ref 4.8–5.6)

## 2020-01-18 NOTE — Progress Notes (Signed)
Chief Complaint:   OBESITY Jennifer [name] is here to discuss her progress with her obesity treatment plan along with follow-up of her obesity related diagnoses. Jennifer [name] is on the Category 1 Plan and states she is following her eating plan approximately 80% of the time. Jennifer [name] states she is doing 0 minutes 0 times per week.  Today's visit was #: 23 Starting weight: 208 lbs Starting date: 08/25/2018 Today's weight: 159 lbs Today's date: 01/17/2020 Total lbs lost to date: 49 Total lbs lost since last in-office visit: 0  Interim History: Jennifer [name] did well being mindful even over the holidays. She did some celebration eating but she was able to damage control how much she indulged.  Subjective:   1. Elevated LFTs Talya has a history of elevated LFTs, most likely due to non alcoholic fatty liver disease. She has done very well with diet and weight loss.  2. Acquired hypothyroidism Jennifer [name] is stable on Synthroid, and she is due for labs. She denies symptoms of over or under replacement.  3. Other hyperlipidemia  Jennifer [name] is stable on pravastatin, and she is working on diet and is due for labs.  4. Hyperglycemia Jennifer [name] is doing very well with diet, and her last labs were almost 1 year ago.  5. Vitamin D deficiency Jennifer [name] is on Vit D, and she is due for labs.  Assessment/Plan:   1. Elevated LFTs We discussed the likely diagnosis of non-alcoholic fatty liver disease today and how this condition is obesity related. Jennifer [name] was educated the importance of weight loss. We will check labs today. Jennifer [name] agreed to continue with her weight loss efforts with healthier diet and exercise as an essential part of her treatment plan.  - Comprehensive metabolic panel  2. Acquired hypothyroidism Patient with long-standing hypothyroidism, on levothyroxine therapy. Ligia appears euthyroid. We will check labs today. Orders and follow up as documented in patient record.  - TSH - T4, free -  T3  3. Other hyperlipidemia  Cardiovascular risk and specific lipid/LDL goals reviewed. We discussed several lifestyle modifications today. We will check labs today. Jennifer [name] will continue to work on diet, exercise and weight loss efforts. Orders and follow up as documented in patient record.   - Lipid Panel With LDL/HDL Ratio  4. Hyperglycemia Fasting labs will be obtained today and results with be discussed with Jennifer [name] in 3 weeks at her follow up visit. In the meanwhile Jennifer [name] will continue her diet, exercise, and weight loss efforts.  - Insulin, random - Hemoglobin A1c  5. Vitamin D deficiency Low Vitamin D level contributes to fatigue and are associated with obesity, breast, and colon cancer. We will check labs today. Jennifer [name] will follow-up for routine testing of Vitamin D, at least 2-3 times per year to avoid over-replacement.  - VITAMIN D 25 [name] (Vit-D Deficiency, Fractures)  6. Class 1 obesity with serious comorbidity and body mass index (BMI) of 30.0 to 30.9 in adult, unspecified obesity type Jennifer [name] is currently in the action stage of change. As such, her goal is to continue with weight loss efforts. She has agreed to the Category 1 Plan.   Behavioral modification strategies: meal planning and cooking strategies.  Jennifer [name] has agreed to follow-up with our clinic in 3 weeks. She was informed of the importance of frequent follow-up visits to maximize her success with intensive lifestyle modifications for her multiple health conditions.   Jennifer [name] was informed we would discuss her lab results at her next visit unless there is a critical issue  that needs to be addressed sooner. Jennifer [name] agreed to keep her next visit at the agreed upon time to discuss these results.  Objective:   Blood pressure 114/69, pulse 64, temperature 97.8 F (36.6 C), height 5\' 3"  (1.6 m), weight 159 lb (72.1 kg), SpO2 95 %. Body mass index is 28.17 kg/m.  General: Cooperative, alert, well developed, in  no acute distress. HEENT: Conjunctivae and lids unremarkable. Cardiovascular: Regular rhythm.  Lungs: Normal work of breathing. Neurologic: No focal deficits.   Lab Results  Component Value Date   CREATININE 0.74 01/17/2020   BUN 24 01/17/2020   NA 136 01/17/2020   K 5.2 01/17/2020   CL 98 01/17/2020   CO2 25 01/17/2020   Lab Results  Component Value Date   ALT 26 01/17/2020   AST 18 01/17/2020   ALKPHOS 97 01/17/2020   BILITOT 0.4 01/17/2020   Lab Results  Component Value Date   HGBA1C 5.2 01/17/2020   HGBA1C 5.4 05/05/2019   HGBA1C 5.4 12/14/2018   HGBA1C 5.4 08/25/2018   HGBA1C 5.2 09/16/2017   Lab Results  Component Value Date   INSULIN 4.7 01/17/2020   INSULIN 13.5 05/05/2019   INSULIN 12.8 12/14/2018   INSULIN 14.7 08/25/2018   Lab Results  Component Value Date   TSH 3.100 01/17/2020   Lab Results  Component Value Date   CHOL 217 (H) 01/17/2020   HDL 62 01/17/2020   LDLCALC 136 (H) 01/17/2020   LDLDIRECT 96.0 05/04/2018   TRIG 110 01/17/2020   CHOLHDL 3.5 05/05/2019   Lab Results  Component Value Date   WBC 4.0 11/01/2019   HGB 13.0 11/01/2019   HCT 37.8 11/01/2019   MCV 89.9 11/01/2019   PLT 311.0 11/01/2019   Lab Results  Component Value Date   IRON 110 11/01/2019   TIBC 382 11/01/2019    Obesity Behavioral Intervention:   Approximately 15 minutes were spent on the discussion below.  ASK: We discussed the diagnosis of obesity with Jennifer [name] today and Jennifer [name] agreed to give [place] permission to discuss obesity behavioral modification therapy today.  ASSESS: Jennifer [name] has the diagnosis of obesity and her BMI today is 28.17. Jennifer [name] is in the action stage of change.   ADVISE: Jennifer [name] was educated on the multiple health risks of obesity as well as the benefit of weight loss to improve her health. She was advised of the need for long term treatment and the importance of lifestyle modifications to improve her current health and to decrease her risk  of future health problems.  AGREE: Multiple dietary modification options and treatment options were discussed and Jennifer [name] agreed to follow the recommendations documented in the above note.  ARRANGE: Jennifer [name] was educated on the importance of frequent visits to treat obesity as outlined per CMS and USPSTF guidelines and agreed to schedule her next follow up appointment today.  Attestation Statements:   Reviewed by clinician on day of visit: allergies, medications, problem list, medical history, surgical history, family history, social history, and previous encounter notes.   I, Trixie Dredge, am acting as transcriptionist for Dennard Nip, MD.  I have reviewed the above documentation for accuracy and completeness, and I agree with the above. -  Dennard Nip, MD

## 2020-01-19 ENCOUNTER — Ambulatory Visit (INDEPENDENT_AMBULATORY_CARE_PROVIDER_SITE_OTHER): Payer: Medicare Other | Admitting: Psychology

## 2020-01-19 DIAGNOSIS — F320 Major depressive disorder, single episode, mild: Secondary | ICD-10-CM | POA: Diagnosis not present

## 2020-01-28 IMAGING — CR CHEST - 2 VIEW
2 series · 2 of 2 positions shown · non-contrast
Comparison: 10/09/2015.

CLINICAL DATA: Preop exam for knee surgery.

EXAM:
CHEST - 2 VIEW

[w chest pa]
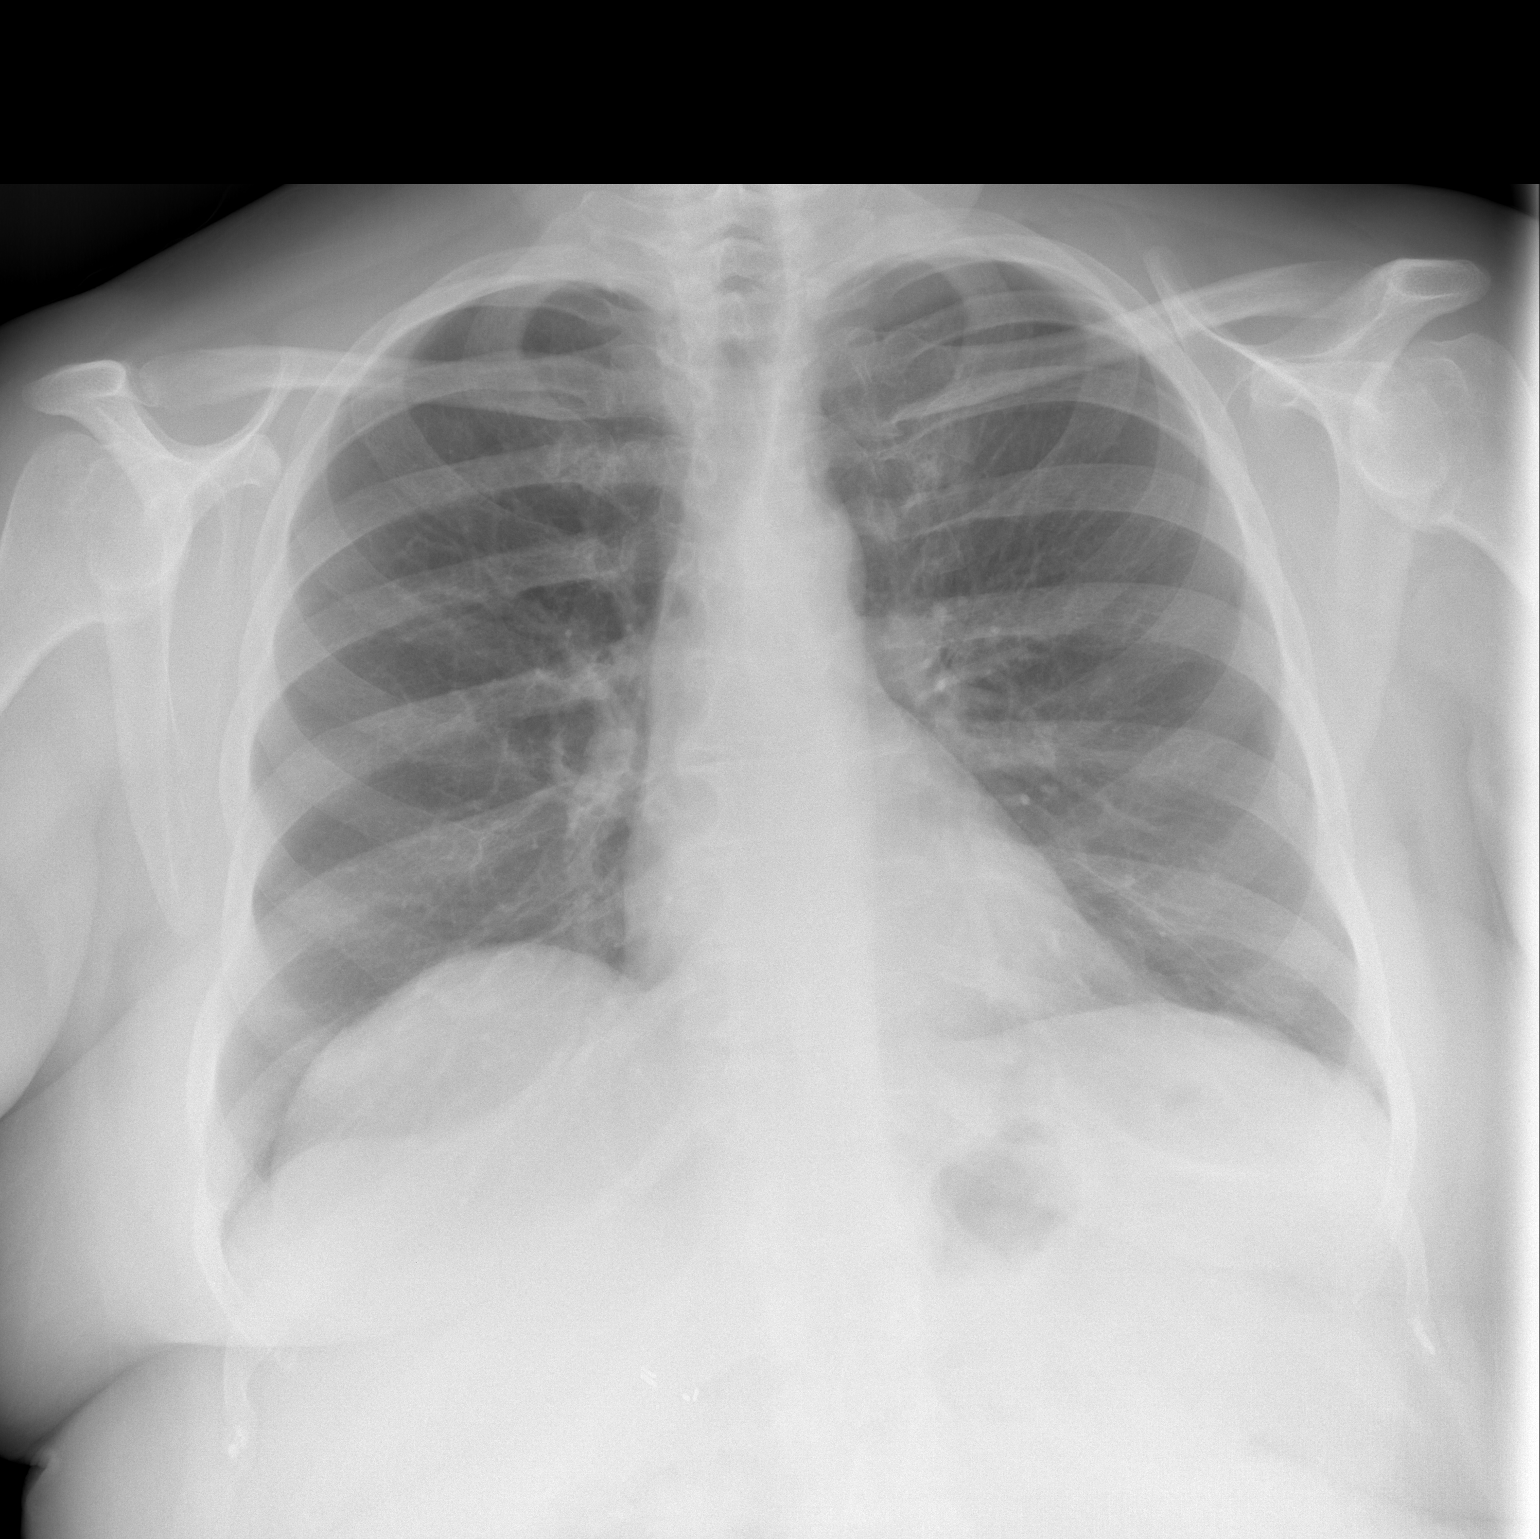

[w chest lat]
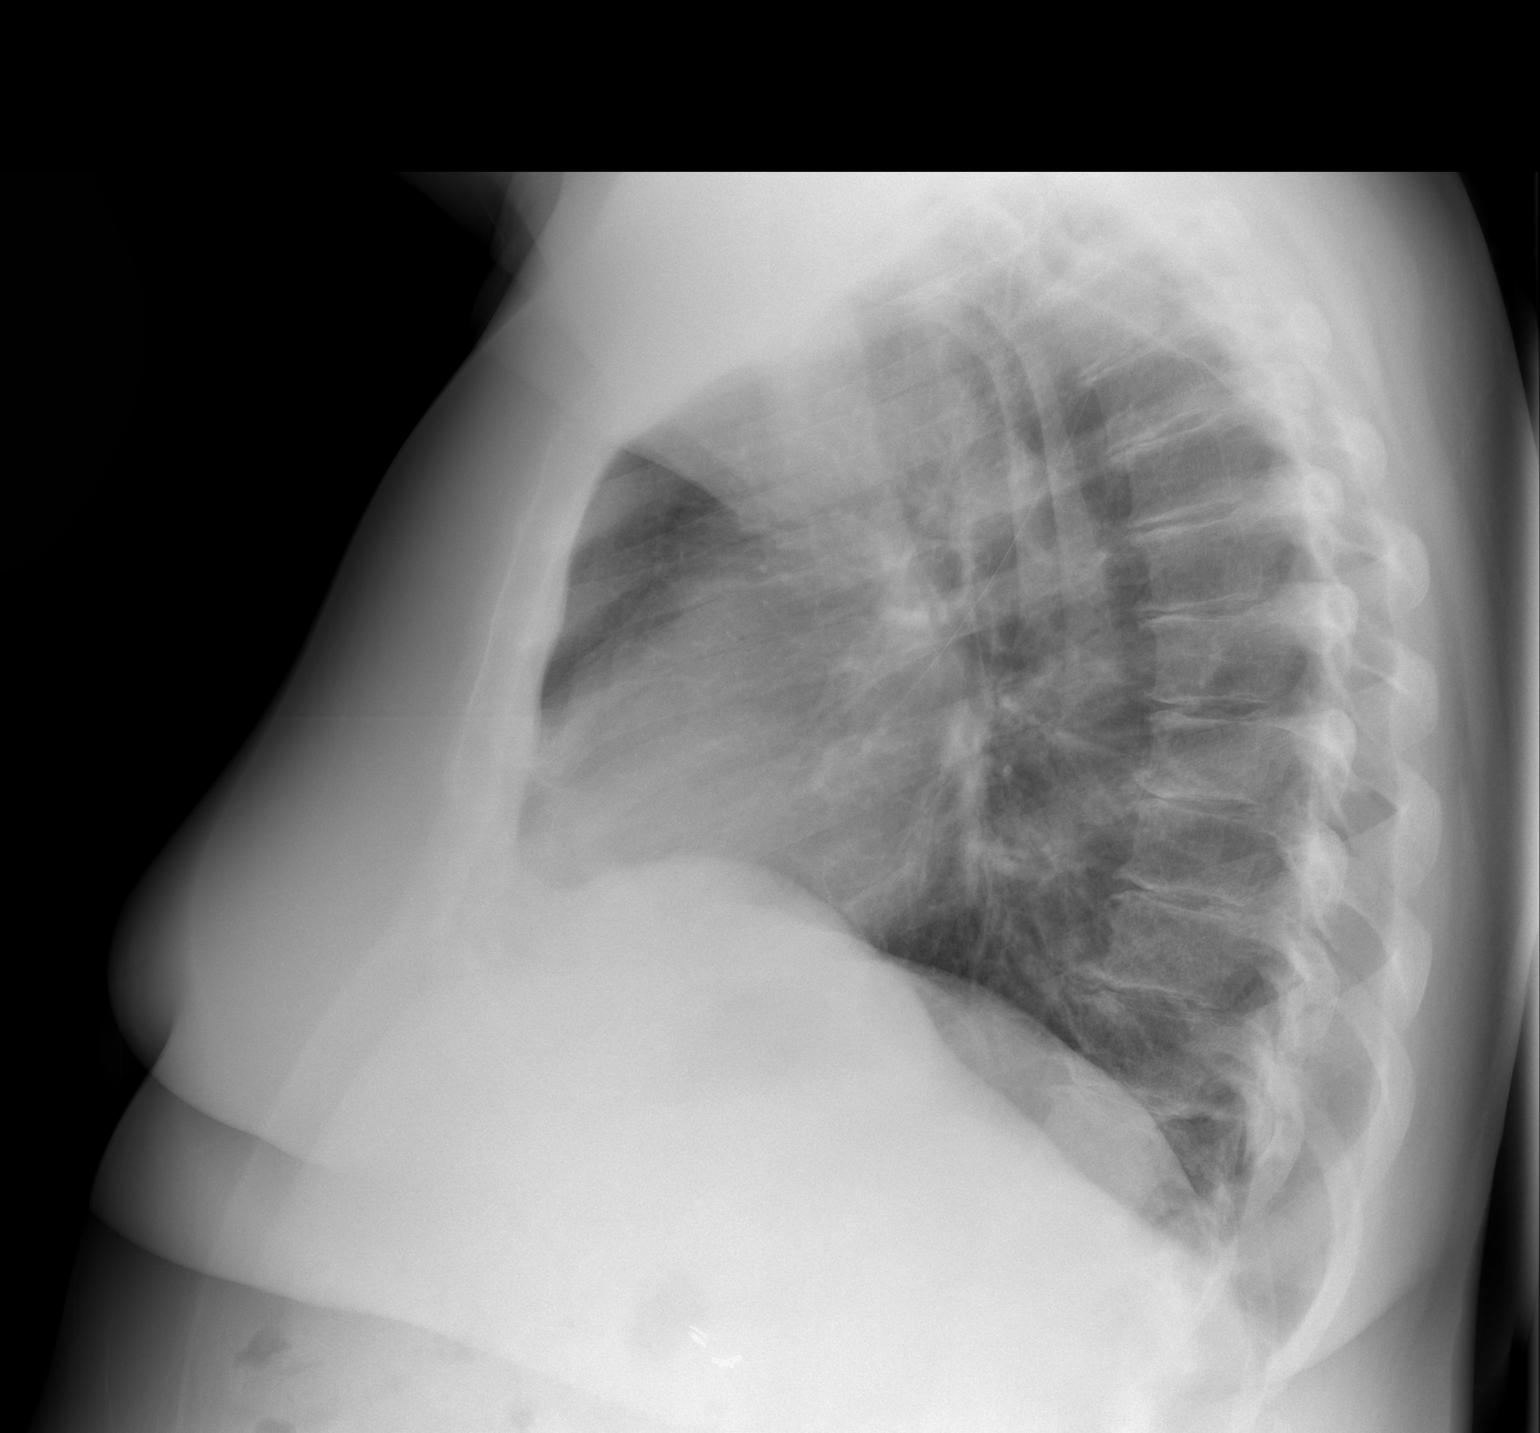

[2 of 2 positions shown; findings below may reference images not displayed]

FINDINGS: Mediastinum and hilar structures normal. Heart size normal. Lungs
are clear. No pleural effusion or pneumothorax noted. Degenerative
change thoracic spine.
IMPRESSION: No acute cardiopulmonary disease.

## 2020-02-02 ENCOUNTER — Ambulatory Visit: Payer: PRIVATE HEALTH INSURANCE | Admitting: Psychology

## 2020-02-15 ENCOUNTER — Other Ambulatory Visit: Payer: Self-pay

## 2020-02-15 ENCOUNTER — Ambulatory Visit (INDEPENDENT_AMBULATORY_CARE_PROVIDER_SITE_OTHER): Payer: Medicare Other | Admitting: Family Medicine

## 2020-02-15 ENCOUNTER — Encounter (INDEPENDENT_AMBULATORY_CARE_PROVIDER_SITE_OTHER): Payer: Self-pay | Admitting: Family Medicine

## 2020-02-15 VITALS — BP 151/74 | HR 60 | Temp 97.4°F | Ht 63.0 in | Wt 159.0 lb

## 2020-02-15 DIAGNOSIS — I10 Essential (primary) hypertension: Secondary | ICD-10-CM | POA: Diagnosis not present

## 2020-02-15 DIAGNOSIS — E559 Vitamin D deficiency, unspecified: Secondary | ICD-10-CM | POA: Diagnosis not present

## 2020-02-15 DIAGNOSIS — E669 Obesity, unspecified: Secondary | ICD-10-CM

## 2020-02-15 DIAGNOSIS — Z6830 Body mass index (BMI) 30.0-30.9, adult: Secondary | ICD-10-CM | POA: Diagnosis not present

## 2020-02-15 DIAGNOSIS — E7849 Other hyperlipidemia: Secondary | ICD-10-CM | POA: Diagnosis not present

## 2020-02-15 NOTE — Progress Notes (Signed)
Chief Complaint:   OBESITY Jennifer [name] is here to discuss her progress with her obesity treatment plan along with follow-up of her obesity related diagnoses. Jennifer [name] is on the Category 1 Plan and states she is following her eating plan approximately 80% of the time. Jennifer [name] states she is doing 0 minutes 0 times per week.  Today's visit was #: 24 Starting weight: 208 lbs Starting date: 08/25/2018 Today's weight: 159 lbs Today's date: 02/15/2020 Total lbs lost to date: 49 Total lbs lost since last in-office visit: 0  Interim History: Jennifer [name] has done very well maintaining her weight. She has been looking into starting some strengthening exercises with a trainer starting next month.  Subjective:   1. Vitamin D deficiency Jennifer [name]'s Vit D level fell a little last month, which is expected in January. She is on a multivitamin with Vit D and OTC Vit D at 2,000 IU daily. She is slightly below goal but not significantly.  2. Other hyperlipidemia Jennifer [name]'s LDL slightly elevated even with her changes in diet, exercise, and weight loss. She is on pravastatin and tolerating it well.   3. Essential hypertension Jennifer [name]'s blood pressure is elevated again. She takes losartan 25 mg 2 tablets PO q daily. She denies chest pain.  Assessment/Plan:   1. Vitamin D deficiency Low Vitamin D level contributes to fatigue and are associated with obesity, breast, and colon cancer. Jennifer [name] will continue Vitamin D as is and will follow-up for routine testing of Vitamin D, at least 2-3 times per year to avoid over-replacement.  2. Other hyperlipidemia Cardiovascular risk and specific lipid/LDL goals reviewed. We discussed several lifestyle modifications today. Jennifer [name] will continue to work on diet, exercise and weight loss efforts. We discussed other statin options with her primary care provider if LDL remains elevated. Orders and follow up as documented in patient record.   3. Essential hypertension Jennifer [name] is  to check her blood pressure at home to see if it is related to being in our office. She can also change her losartan to 25 mg BID and see if this makes a difference. We will recheck her blood pressure in 3-4 weeks. She will continue working on healthy weight loss and exercise to improve blood pressure control.   4. Class 1 obesity with serious comorbidity and body mass index (BMI) of 30.0 to 30.9 in adult, unspecified obesity type Jennifer [name] is currently in the action stage of change. As such, her goal is to continue with weight loss efforts. She has agreed to the Category 1 Plan.   Jennifer [name] is to decrease caffeine in her diet.  Exercise goals: No exercise has been prescribed at this time.  Behavioral modification strategies: increasing lean protein intake and increasing water intake.  Jennifer [name] has agreed to follow-up with our clinic in 4 weeks. She was informed of the importance of frequent follow-up visits to maximize her success with intensive lifestyle modifications for her multiple health conditions.   Objective:   Blood pressure (!) 151/74, pulse 60, temperature (!) 97.4 F (36.3 C), height 5\' 3"  (1.6 m), weight 159 lb (72.1 kg), SpO2 98 %. Body mass index is 28.17 kg/m.  General: Cooperative, alert, well developed, in no acute distress. HEENT: Conjunctivae and lids unremarkable. Cardiovascular: Regular rhythm.  Lungs: Normal work of breathing. Neurologic: No focal deficits.   Lab Results  Component Value Date   CREATININE 0.74 01/17/2020   BUN 24 01/17/2020   NA 136 01/17/2020   K 5.2 01/17/2020   CL 98 01/17/2020  CO2 25 01/17/2020   Lab Results  Component Value Date   ALT 26 01/17/2020   AST 18 01/17/2020   ALKPHOS 97 01/17/2020   BILITOT 0.4 01/17/2020   Lab Results  Component Value Date   HGBA1C 5.2 01/17/2020   HGBA1C 5.4 05/05/2019   HGBA1C 5.4 12/14/2018   HGBA1C 5.4 08/25/2018   HGBA1C 5.2 09/16/2017   Lab Results  Component Value Date   INSULIN 4.7  01/17/2020   INSULIN 13.5 05/05/2019   INSULIN 12.8 12/14/2018   INSULIN 14.7 08/25/2018   Lab Results  Component Value Date   TSH 3.100 01/17/2020   Lab Results  Component Value Date   CHOL 217 (H) 01/17/2020   HDL 62 01/17/2020   LDLCALC 136 (H) 01/17/2020   LDLDIRECT 96.0 05/04/2018   TRIG 110 01/17/2020   CHOLHDL 3.5 05/05/2019   Lab Results  Component Value Date   WBC 4.0 11/01/2019   HGB 13.0 11/01/2019   HCT 37.8 11/01/2019   MCV 89.9 11/01/2019   PLT 311.0 11/01/2019   Lab Results  Component Value Date   IRON 110 11/01/2019   TIBC 382 11/01/2019   Attestation Statements:   Reviewed by clinician on day of visit: allergies, medications, problem list, medical history, surgical history, family history, social history, and previous encounter notes.  Time spent on visit including pre-visit chart review and post-visit care and charting was 34 minutes.    I, Trixie Dredge, am acting as transcriptionist for Dennard Nip, MD.  I have reviewed the above documentation for accuracy and completeness, and I agree with the above. -  Dennard Nip, MD

## 2020-02-16 ENCOUNTER — Ambulatory Visit: Payer: PRIVATE HEALTH INSURANCE | Admitting: Psychology

## 2020-02-23 DIAGNOSIS — H04123 Dry eye syndrome of bilateral lacrimal glands: Secondary | ICD-10-CM | POA: Diagnosis not present

## 2020-02-23 DIAGNOSIS — H52203 Unspecified astigmatism, bilateral: Secondary | ICD-10-CM | POA: Diagnosis not present

## 2020-02-23 DIAGNOSIS — H35372 Puckering of macula, left eye: Secondary | ICD-10-CM | POA: Diagnosis not present

## 2020-02-23 DIAGNOSIS — H25813 Combined forms of age-related cataract, bilateral: Secondary | ICD-10-CM | POA: Diagnosis not present

## 2020-03-01 ENCOUNTER — Ambulatory Visit (INDEPENDENT_AMBULATORY_CARE_PROVIDER_SITE_OTHER): Payer: Medicare Other | Admitting: Psychology

## 2020-03-01 DIAGNOSIS — F320 Major depressive disorder, single episode, mild: Secondary | ICD-10-CM

## 2020-03-02 DIAGNOSIS — L578 Other skin changes due to chronic exposure to nonionizing radiation: Secondary | ICD-10-CM | POA: Diagnosis not present

## 2020-03-02 DIAGNOSIS — D225 Melanocytic nevi of trunk: Secondary | ICD-10-CM | POA: Diagnosis not present

## 2020-03-02 DIAGNOSIS — D2272 Melanocytic nevi of left lower limb, including hip: Secondary | ICD-10-CM | POA: Diagnosis not present

## 2020-03-02 DIAGNOSIS — L821 Other seborrheic keratosis: Secondary | ICD-10-CM | POA: Diagnosis not present

## 2020-03-02 DIAGNOSIS — L814 Other melanin hyperpigmentation: Secondary | ICD-10-CM | POA: Diagnosis not present

## 2020-03-06 ENCOUNTER — Other Ambulatory Visit: Payer: Self-pay

## 2020-03-06 DIAGNOSIS — G40009 Localization-related (focal) (partial) idiopathic epilepsy and epileptic syndromes with seizures of localized onset, not intractable, without status epilepticus: Secondary | ICD-10-CM

## 2020-03-06 MED ORDER — LEVETIRACETAM 500 MG PO TABS: 500.0000 mg | ORAL_TABLET | Freq: Two times a day (BID) | ORAL | 3 refills | Status: DC

## 2020-03-06 NOTE — Progress Notes (Signed)
Per pt Keppra sent to [facility] in [facility]. Script cancelled at [facility].

## 2020-03-13 ENCOUNTER — Encounter (INDEPENDENT_AMBULATORY_CARE_PROVIDER_SITE_OTHER): Payer: Self-pay | Admitting: Family Medicine

## 2020-03-13 ENCOUNTER — Other Ambulatory Visit: Payer: Self-pay

## 2020-03-13 ENCOUNTER — Ambulatory Visit (INDEPENDENT_AMBULATORY_CARE_PROVIDER_SITE_OTHER): Payer: Medicare Other | Admitting: Family Medicine

## 2020-03-13 VITALS — BP 123/69 | HR 69 | Temp 98.4°F | Ht 63.0 in | Wt 158.0 lb

## 2020-03-13 DIAGNOSIS — I10 Essential (primary) hypertension: Secondary | ICD-10-CM | POA: Diagnosis not present

## 2020-03-13 DIAGNOSIS — Z6830 Body mass index (BMI) 30.0-30.9, adult: Secondary | ICD-10-CM

## 2020-03-13 DIAGNOSIS — E669 Obesity, unspecified: Secondary | ICD-10-CM

## 2020-03-14 NOTE — Progress Notes (Signed)
Chief Complaint:   OBESITY Jennifer [name] is here to discuss her progress with her obesity treatment plan along with follow-up of her obesity related diagnoses. Jennifer Jennifer [name] is on the Category 1 Plan and states she is following her eating plan approximately 80% of the time. Jennifer Jennifer [name] states she is doing strengthening and balance for 30-45 minutes 2-3 times per week.  Today's visit was #: 25 Starting weight: 208 lbs Starting date: 08/25/2018 Today's weight: 158 lbs Today's date: 03/13/2020 Total lbs lost to date: 50 Total lbs lost since last in-office visit: 1  Interim History: Jennifer [name] continues to do well with weight loss, and exercising both in classes and with her Physiological scientist. She is doing well with decreasing caffieine and she feels she is sleeping better overall.  Subjective:   1. Essential hypertension Jennifer Jennifer [name]'s blood pressure is well controlled with diet and exercise. She is stable on her medications and she denies signs of hypotension. Her GFR is within normal limits.  Assessment/Plan:   1. Essential hypertension Jennifer Jennifer [name] will continue Cozaar, and will continue working on diet, healthy weight loss, and exercise to improve blood pressure control. We will watch for signs of hypotension as she continues her lifestyle modifications.  2. Class 1 obesity with serious comorbidity and body mass index (BMI) of 30.0 to 30.9 in adult, unspecified obesity type Jennifer Jennifer [name] is currently in the action stage of change. As such, her goal is to continue with weight loss efforts. She has agreed to the Category 1 Plan.   Exercise goals: As is.  Behavioral modification strategies: increasing lean protein intake and meal planning and cooking strategies.  Jennifer Jennifer [name] has agreed to follow-up with our clinic in 8 weeks. She was informed of the importance of frequent follow-up visits to maximize her success with intensive lifestyle modifications for her multiple health conditions.   Objective:   Blood pressure  123/69, pulse 69, temperature 98.4 F (36.9 C), temperature source Oral, height 5\' 3"  (1.6 m), weight 158 lb (71.7 kg), SpO2 98 %. Body mass index is 27.99 kg/m.  General: Cooperative, alert, well developed, in no acute distress. HEENT: Conjunctivae and lids unremarkable. Cardiovascular: Regular rhythm.  Lungs: Normal work of breathing. Neurologic: No focal deficits.   Lab Results  Component Value Date   CREATININE 0.74 01/17/2020   BUN 24 01/17/2020   NA 136 01/17/2020   K 5.2 01/17/2020   CL 98 01/17/2020   CO2 25 01/17/2020   Lab Results  Component Value Date   ALT 26 01/17/2020   AST 18 01/17/2020   ALKPHOS 97 01/17/2020   BILITOT 0.4 01/17/2020   Lab Results  Component Value Date   HGBA1C 5.2 01/17/2020   HGBA1C 5.4 05/05/2019   HGBA1C 5.4 12/14/2018   HGBA1C 5.4 08/25/2018   HGBA1C 5.2 09/16/2017   Lab Results  Component Value Date   INSULIN 4.7 01/17/2020   INSULIN 13.5 05/05/2019   INSULIN 12.8 12/14/2018   INSULIN 14.7 08/25/2018   Lab Results  Component Value Date   TSH 3.100 01/17/2020   Lab Results  Component Value Date   CHOL 217 (H) 01/17/2020   HDL 62 01/17/2020   LDLCALC 136 (H) 01/17/2020   LDLDIRECT 96.0 05/04/2018   TRIG 110 01/17/2020   CHOLHDL 3.5 05/05/2019   Lab Results  Component Value Date   WBC 4.0 11/01/2019   HGB 13.0 11/01/2019   HCT 37.8 11/01/2019   MCV 89.9 11/01/2019   PLT 311.0 11/01/2019   Lab Results  Component Value Date  IRON 110 11/01/2019   TIBC 382 11/01/2019   Attestation Statements:   Reviewed by clinician on day of visit: allergies, medications, problem list, medical history, surgical history, family history, social history, and previous encounter notes.  Time spent on visit including pre-visit chart review and post-visit care and charting was 35 minutes.    I, Trixie Dredge, am acting as transcriptionist for Dennard Nip, MD.  I have reviewed the above documentation for accuracy and  completeness, and I agree with the above. -  Dennard Nip, MD

## 2020-03-15 ENCOUNTER — Ambulatory Visit: Payer: PRIVATE HEALTH INSURANCE | Admitting: Psychology

## 2020-03-27 ENCOUNTER — Telehealth: Payer: Self-pay

## 2020-03-27 NOTE — Telephone Encounter (Signed)
Left patient vm to call back to schedule TOC appt from [facility] to Dr. Carlota Raspberry.

## 2020-03-29 ENCOUNTER — Ambulatory Visit: Payer: PRIVATE HEALTH INSURANCE | Admitting: Psychology

## 2020-04-12 ENCOUNTER — Ambulatory Visit: Payer: PRIVATE HEALTH INSURANCE | Admitting: Psychology

## 2020-04-21 ENCOUNTER — Ambulatory Visit (INDEPENDENT_AMBULATORY_CARE_PROVIDER_SITE_OTHER): Payer: Medicare Other | Admitting: Psychology

## 2020-04-21 DIAGNOSIS — F320 Major depressive disorder, single episode, mild: Secondary | ICD-10-CM

## 2020-04-26 ENCOUNTER — Ambulatory Visit: Payer: PRIVATE HEALTH INSURANCE | Admitting: Psychology

## 2020-05-15 ENCOUNTER — Encounter (INDEPENDENT_AMBULATORY_CARE_PROVIDER_SITE_OTHER): Payer: Self-pay | Admitting: Family Medicine

## 2020-05-15 ENCOUNTER — Other Ambulatory Visit: Payer: Self-pay

## 2020-05-15 ENCOUNTER — Ambulatory Visit (INDEPENDENT_AMBULATORY_CARE_PROVIDER_SITE_OTHER): Payer: Medicare Other | Admitting: Family Medicine

## 2020-05-15 VITALS — BP 140/71 | HR 72 | Temp 98.0°F | Ht 63.0 in | Wt 163.0 lb

## 2020-05-15 DIAGNOSIS — Z6836 Body mass index (BMI) 36.0-36.9, adult: Secondary | ICD-10-CM | POA: Diagnosis not present

## 2020-05-15 DIAGNOSIS — E559 Vitamin D deficiency, unspecified: Secondary | ICD-10-CM | POA: Diagnosis not present

## 2020-05-16 NOTE — Progress Notes (Signed)
Chief Complaint:   OBESITY Jennifer [name] is here to discuss her progress with her obesity treatment plan along with follow-up of her obesity related diagnoses. Mikaia is on the Category 1 Plan and states she is following her eating plan approximately 60% of the time. Rettie states she is doing 0 minutes 0 times per week.  Today's visit was #: 49 Starting weight: 208 lbs Starting date: 08/25/2018 Today's weight: 163 lbs Today's date: 05/15/2020 Total lbs lost to date: 45 Total lbs lost since last in-office visit: 0  Interim History: Phylis has a lot of challenges and temptations since her last visit, especially while dealing with family tragic death of her brother in-law. She is trying to get back on track and this next month is expected to calmer.  Subjective:   1. Vitamin D deficiency Krisi is on OTC Vit D, and she has no signs of over-replacement.  Assessment/Plan:   1. Vitamin D deficiency Low Vitamin D level contributes to fatigue and are associated with obesity, breast, and colon cancer. We will check labs today, and Peaches will continue taking OTC Vitamin D 2,000 IU daily and will follow-up for routine testing of Vitamin D, at least 2-3 times per year to avoid over-replacement.  2. Obesity with current BMI 28.9 Belanna is currently in the action stage of change. As such, her goal is to continue with weight loss efforts. She has agreed to the Category 1 Plan.   Behavioral modification strategies: increasing lean protein intake, emotional eating strategies and planning for success.  Briseyda has agreed to follow-up with our clinic in 4 to 5 weeks. She was informed of the importance of frequent follow-up visits to maximize her success with intensive lifestyle modifications for her multiple health conditions.   Objective:   Blood pressure 140/71, pulse 72, temperature 98 F (36.7 C), height 5\' 3"  (1.6 m), weight 163 lb (73.9 kg), SpO2 96 %. Body mass index is 28.87  kg/m.  General: Cooperative, alert, well developed, in no acute distress. HEENT: Conjunctivae and lids unremarkable. Cardiovascular: Regular rhythm.  Lungs: Normal work of breathing. Neurologic: No focal deficits.   Lab Results  Component Value Date   CREATININE 0.74 01/17/2020   BUN 24 01/17/2020   NA 136 01/17/2020   K 5.2 01/17/2020   CL 98 01/17/2020   CO2 25 01/17/2020   Lab Results  Component Value Date   ALT 26 01/17/2020   AST 18 01/17/2020   ALKPHOS 97 01/17/2020   BILITOT 0.4 01/17/2020   Lab Results  Component Value Date   HGBA1C 5.2 01/17/2020   HGBA1C 5.4 05/05/2019   HGBA1C 5.4 12/14/2018   HGBA1C 5.4 08/25/2018   HGBA1C 5.2 09/16/2017   Lab Results  Component Value Date   INSULIN 4.7 01/17/2020   INSULIN 13.5 05/05/2019   INSULIN 12.8 12/14/2018   INSULIN 14.7 08/25/2018   Lab Results  Component Value Date   TSH 3.100 01/17/2020   Lab Results  Component Value Date   CHOL 217 (H) 01/17/2020   HDL 62 01/17/2020   LDLCALC 136 (H) 01/17/2020   LDLDIRECT 96.0 05/04/2018   TRIG 110 01/17/2020   CHOLHDL 3.5 05/05/2019   Lab Results  Component Value Date   WBC 4.0 11/01/2019   HGB 13.0 11/01/2019   HCT 37.8 11/01/2019   MCV 89.9 11/01/2019   PLT 311.0 11/01/2019   Lab Results  Component Value Date   IRON 110 11/01/2019   TIBC 382 11/01/2019   Attestation Statements:  Reviewed by clinician on day of visit: allergies, medications, problem list, medical history, surgical history, family history, social history, and previous encounter notes.  Time spent on visit including pre-visit chart review and post-visit care and charting was 32 minutes.    I, Trixie Dredge, am acting as transcriptionist for Dennard Nip, MD.  I have reviewed the above documentation for accuracy and completeness, and I agree with the above. -  Dennard Nip, MD

## 2020-05-24 ENCOUNTER — Ambulatory Visit (INDEPENDENT_AMBULATORY_CARE_PROVIDER_SITE_OTHER): Payer: Medicare Other | Admitting: Psychology

## 2020-05-24 DIAGNOSIS — F320 Major depressive disorder, single episode, mild: Secondary | ICD-10-CM

## 2020-06-03 DIAGNOSIS — Z23 Encounter for immunization: Secondary | ICD-10-CM | POA: Diagnosis not present

## 2020-06-08 ENCOUNTER — Ambulatory Visit (INDEPENDENT_AMBULATORY_CARE_PROVIDER_SITE_OTHER): Payer: Medicare Other | Admitting: Family Medicine

## 2020-06-08 ENCOUNTER — Other Ambulatory Visit: Payer: Self-pay

## 2020-06-08 ENCOUNTER — Encounter: Payer: Self-pay | Admitting: Family Medicine

## 2020-06-08 VITALS — BP 128/74 | HR 67 | Temp 98.3°F | Resp 16 | Ht 63.0 in | Wt 170.6 lb

## 2020-06-08 DIAGNOSIS — F329 Major depressive disorder, single episode, unspecified: Secondary | ICD-10-CM | POA: Diagnosis not present

## 2020-06-08 DIAGNOSIS — W57XXXA Bitten or stung by nonvenomous insect and other nonvenomous arthropods, initial encounter: Secondary | ICD-10-CM

## 2020-06-08 DIAGNOSIS — S60562A Insect bite (nonvenomous) of left hand, initial encounter: Secondary | ICD-10-CM | POA: Diagnosis not present

## 2020-06-08 DIAGNOSIS — E039 Hypothyroidism, unspecified: Secondary | ICD-10-CM

## 2020-06-08 DIAGNOSIS — E7849 Other hyperlipidemia: Secondary | ICD-10-CM

## 2020-06-08 DIAGNOSIS — F419 Anxiety disorder, unspecified: Secondary | ICD-10-CM | POA: Diagnosis not present

## 2020-06-08 DIAGNOSIS — I10 Essential (primary) hypertension: Secondary | ICD-10-CM | POA: Diagnosis not present

## 2020-06-08 DIAGNOSIS — E119 Type 2 diabetes mellitus without complications: Secondary | ICD-10-CM

## 2020-06-08 MED ORDER — LOSARTAN POTASSIUM 25 MG PO TABS: 50.0000 mg | ORAL_TABLET | Freq: Every day | ORAL | 1 refills | Status: DC

## 2020-06-08 MED ORDER — CITALOPRAM HYDROBROMIDE 40 MG PO TABS: 40.0000 mg | ORAL_TABLET | Freq: Every day | ORAL | 1 refills | Status: DC

## 2020-06-08 MED ORDER — TRIAMCINOLONE ACETONIDE 0.1 % EX CREA: 1.0000 "application " | TOPICAL_CREAM | Freq: Two times a day (BID) | CUTANEOUS | 0 refills | Status: DC

## 2020-06-08 NOTE — Progress Notes (Signed)
Subjective:  Patient ID: Jennifer [name], female    DOB: 1950/05/22  Age: 69 y.o. MRN: [id number]  CC:  Chief Complaint  Patient presents with  . Establish Care    Pt is here to establish care, pt doing well.   . Hand Pain    Pt reports red spot on the small finger of her Lt hand, pt reports no pain or discomfort, 1 week thinks may have been bitten, reports this is itchy but otherwise no abnormalities    Here to establish care along with acute concerns as above previous primary provider [place].   HPI Jennifer [name] presents for   Hand pain  notes a red spot on her left hand, top of 5th finger.  Sting? Or maybe caught in screen door. about the same size. Itches. No treatments. No d/c. Able to bend ok. No fever, feels ok otherwise. Leaving out of town in 12 days.   Followed by neuro for seizures, none in 6 years. Dr. Delice Lesch. On Keppra.   Hypothyroidism: Lab Results  Component Value Date   TSH 3.100 01/17/2020   Synthroid 8mcg qd. Has fasting labs soon with Dr Leafy Ro - followed for weight management.   Anxiety: celexa 40 mg qd - working well.  Depression screen [facility] 2/9 06/08/2020 11/01/2019 04/19/2019 08/25/2018 05/06/2018  Decreased Interest 0 0 1 1 0  Down, Depressed, Hopeless 0 0 1 2 0  PHQ - 2 Score 0 0 2 3 0  Altered sleeping 0 - 0 1 0  Tired, decreased energy 1 - 0 2 0  Change in appetite 0 - 0 2 1  Feeling bad or failure about yourself  0 - 0 3 1  Trouble concentrating 0 - 0 0 1  Moving slowly or fidgety/restless 0 - 0 0 0  Suicidal thoughts 0 - 0 0 0  PHQ-9 Score 1 - 2 11 3   Difficult doing work/chores - - Not difficult at all Not difficult at all Not difficult at all  Some encounter information is confidential and restricted. Go to Review Flowsheets activity to see all data.  Some recent data might be hidden   GAD 7 : Generalized Anxiety Score 06/08/2020  Nervous, Anxious, on Edge 1  Control/stop worrying 0  Worry too  much - different things 0  Trouble relaxing 0  Restless 0  Easily annoyed or irritable 0  Afraid - awful might happen 0  Total GAD 7 Score 1    Hypertension: Losartan 50mg  qd. On CPAP for OSA - Dr. Rexene Alberts. Using nightly.  Home readings: normal. No new side effects.  BP Readings from Last 3 Encounters:  06/08/20 128/74  05/15/20 140/71  03/13/20 123/69   Lab Results  Component Value Date   CREATININE 0.74 01/17/2020   Hyperlipidemia: pravastain 80mg  qd - no new myalgias or side effects.  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    History Patient Active Problem List   Diagnosis Date Noted  . Elevated LFTs 11/01/2019  . Abrasion of left buttock  08/05/2018  . S/P total knee arthroplasty, left 07/20/2018  . Degenerative arthritis of left knee 07/17/2018  . Chronic constipation 12/20/2017  . Class 1 obesity due to excess calories without serious comorbidity with body mass index (BMI) of 34.0 to 34.9 in adult 12/20/2017  . Primary osteoarthritis of right hip 10/20/2015  . Arthritis of right hip 10/20/2015  . Localization-related idiopathic epilepsy and epileptic syndromes with seizures of localized onset, not intractable, without status epilepticus ([place]) 09/09/2014  . Depression 07/19/2014  . Hx of adenomatous polyp of colon 03/29/2014  . Seizure after head injury ([place]) 08/29/2013  . OSA (obstructive sleep apnea) 04/24/2012  . PLMD (periodic limb movement disorder) 04/24/2012  . Fibroids, submucosal 12/18/2010  . HYPERLIPIDEMIA 07/04/2008  . Essential hypertension 07/04/2008  . BAKER'S CYST, LEFT KNEE 07/04/2008  . Shortened PR  interval 07/04/2008   Past Medical History:  Diagnosis Date  . Allergy   . Anxiety   . Constipation   . Depression   . Dysrhythmia    FLB - ? PAC'S COMES AND GOES  . Gallbladder problem   . GERD (gastroesophageal reflux disease)   . High cholesterol   . Hx of adenomatous polyp of colon    2012 - [place]  . Hyperlipidemia   . Hypertension   . Hypothyroid   . Joint pain    no current problems  . Osteoarthritis   . PLMD (periodic limb movement disorder) 04/24/2012  . Prediabetes   . Seizures ([place]) 08/29/2013   only 1 seizure episode  . Sleep apnea    wears CPAP nightly  . Vitamin D deficiency    Past Surgical History:  Procedure Laterality Date  . APPENDECTOMY    . CESAREAN SECTION      x1  . CHOLECYSTECTOMY  1978  . COLONOSCOPY    . DILATION AND CURETTAGE OF UTERUS    . JOINT REPLACEMENT  1998   right knee  . OSTEOTOMY PROXIMAL FEMORAL    . 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   Procedure: TOTAL HIP ARTHROPLASTY ANTERIOR APPROACH;  Surgeon: Frederik Pear, MD;  Location: [place];  Service: Orthopedics;  Laterality: Right;  . TOTAL KNEE ARTHROPLASTY Left 07/20/2018   Procedure: Left Knee Arthroplasty;  Surgeon: Frederik Pear, MD;  Location: [facility];  Service: Orthopedics;  Laterality: Left;   Allergies  Allergen Reactions  . Crestor [Rosuvastatin] Other (See Comments)    Muscle aches  . Neomycin-Bacitracin Zn-Polymyx Swelling    OPTHALMIC EXPOSURE ONLY - - SWELLING EYES   . Neomycin-Bacitracin-Polymyxin [Bacitracin-Neomycin-Polymyxin] Swelling    Reaction to eye drops  . Codeine Rash  . Feldene [Piroxicam] Rash  . Penicillins Rash    Has patient had a PCN reaction causing immediate rash, facial/tongue/throat swelling, SOB or lightheadedness with hypotension:Yes Has patient had a PCN reaction causing severe rash involving mucus membranes or skin necrosis: No Has patient had a PCN reaction that required hospitalization:No Has patient had a PCN reaction occurring  within the last 10 years:No If all of the above answers are "NO", then may proceed with Cephalosporin use.   . Piroxicam Rash  . Sulfa Antibiotics Rash  . Sulfonamide Derivatives Rash   Prior to Admission medications   Medication Sig Start Date End Date Taking? Authorizing Provider  Biotin 1000 MCG tablet Take 1,000 mcg by mouth daily.   Yes [provider]  CALCIUM CARBONATE PO Take 600 mg by mouth daily.    Yes [provider]  cetirizine (ZYRTEC) 10 MG tablet Take 10  mg by mouth every evening.    Yes [provider]  Cholecalciferol (VITAMIN D) 50 MCG (2000 UT) tablet Take 2,000 Units by mouth daily.   Yes [provider]  citalopram (CELEXA) 40 MG tablet TAKE 1 TABLET BY MOUTH EVERY DAY 12/06/19  Yes Elby Beck, FNP  hydroxypropyl methylcellulose / hypromellose (ISOPTO TEARS / GONIOVISC) 2.5 % ophthalmic solution Place 1 drop into both eyes 2 (two) times daily as needed for dry eyes.   Yes [provider]  ketoconazole (NIZORAL) 2 % cream Apply 1 application topically daily. 03/03/19  Yes [provider]  levETIRAcetam (KEPPRA) 500 MG tablet Take 1 tablet (500 mg total) by mouth 2 (two) times daily. 03/06/20  Yes Cameron Sprang, MD  levothyroxine (SYNTHROID) 50 MCG tablet TAKE 1 TABLET BY MOUTH EVERY DAY 08/23/19  Yes Elby Beck, FNP  losartan (COZAAR) 25 MG tablet TAKE 2 TABLETS BY MOUTH EVERY DAY 12/06/19  Yes Elby Beck, FNP  MELATONIN PO Take 5 mg by mouth at bedtime.    Yes [provider]  Multiple Vitamins-Minerals (MULTIPLE VITAMINS/WOMENS PO) Take 1 tablet by mouth daily.    Yes [provider]  NAPROSYN 500 MG tablet Take 500 mg by mouth 2 (two) times daily as needed. 10/27/19  Yes [provider]  pravastatin (PRAVACHOL) 80 MG tablet TAKE 1 TABLET BY MOUTH EVERY DAY 10/12/19  Yes Elby Beck, FNP  valACYclovir (VALTREX) 1000 MG tablet Take 1 tablet (1,000 mg total) by  mouth 2 (two) times daily. 07/02/19  Yes Elby Beck, FNP   Social History   Socioeconomic History  . Marital status: Divorced    Spouse name: Not on file  . Number of children: 1  . Years of education: Not on file  . Highest education level: Not on file  Occupational History  . Occupation: Retired    Fish farm manager: [employer]  Tobacco Use  . Smoking status: Never Smoker  . Smokeless tobacco: Never Used  Vaping Use  . Vaping Use: Never used  Substance and Sexual Activity  . Alcohol use: No    Alcohol/week: 0.0 standard drinks  . Drug use: No  . Sexual activity: Never    Birth control/protection: Post-menopausal  Other Topics Concern  . Not on file  Social History Narrative   Lives with blind dog and cat.    Right handed    Social Determinants of Health   Financial Resource Strain: Not on file  Food Insecurity: Not on file  Transportation Needs: Not on file  Physical Activity: Not on file  Stress: Not on file  Social Connections: Not on file  Intimate Partner Violence: Not on file    Review of Systems  Constitutional: Negative for chills, fatigue (chronic, no new symptoms. ), fever and unexpected weight change.  Respiratory: Negative for chest tightness and shortness of breath.   Cardiovascular: Negative for chest pain, palpitations and leg swelling.  Gastrointestinal: Negative for abdominal pain, blood in stool, nausea and vomiting.       Lower abdomen/bladder area  Genitourinary: Positive for dysuria, frequency and urgency. Negative for difficulty urinating, hematuria, menstrual problem, pelvic pain, vaginal bleeding, vaginal discharge and vaginal pain.  Musculoskeletal: Negative for back pain.  Neurological: Negative for dizziness, syncope, light-headedness and headaches.     Objective:   Vitals:   06/08/20 1527  BP: 128/74  Pulse: 67  Resp: 16  Temp: 98.3 F (36.8 C)  TempSrc: Temporal  SpO2: 98%  Weight: 170 lb 9.6 oz (77.4 kg)  Height: 5\' 3"   (1.6 m)     Physical Exam    .  Assessment & Plan:  Jennifer [name] is a 70 y.o. female . Insect bite of left hand, initial encounter - Plan: triamcinolone cream (KENALOG) 0.1 %  -No apparent sign of infection, full range of motion.  Suspected insect bite, stable, triamcinolone cream twice daily as needed with RTC precautions if not improving.  Reactive depression - Plan: citalopram (CELEXA) 40 MG tablet Anxiety  -Stable with Celexa, continue same dose.  Essential hypertension - Plan: losartan (COZAAR) 25 MG tablet  -Stable, continue losartan, plan blood work with healthy weight and wellness.  Controlled type 2 diabetes mellitus without complication, without long-term current use of insulin ([place]) - Plan: losartan (COZAAR) 25 MG tablet  -Plan blood work with healthy weight and wellness.  Other hyperlipidemia  -Tolerating pravastatin at current dose, fasting labs pending.  Hypothyroidism  -Most recent TSH stable, denies new symptoms.  Continue same dose Synthroid.  75-month follow-up.  Meds ordered this encounter  Medications  . triamcinolone cream (KENALOG) 0.1 %    Sig: Apply 1 application topically 2 (two) times daily.    Dispense:  30 g    Refill:  0  . citalopram (CELEXA) 40 MG tablet    Sig: Take 1 tablet (40 mg total) by mouth daily.    Dispense:  90 tablet    Refill:  1  . losartan (COZAAR) 25 MG tablet    Sig: Take 2 tablets (50 mg total) by mouth daily.    Dispense:  180 tablet    Refill:  1   Patient Instructions  Steroid cream up to twice per day to finger as needed. If not improving in next 10 days, recheck.  Sooner if worse.   No med changes at this time. Good seeing you today. recheck in 6 months. Please let me know if there are questions sooner.        Signed, Merri Ray, MD [facility]

## 2020-06-08 NOTE — Patient Instructions (Addendum)
Steroid cream up to twice per day to finger as needed. If not improving in next 10 days, recheck.  Sooner if worse.   No med changes at this time. Good seeing you today. recheck in 6 months. Please let me know if there are questions sooner.

## 2020-06-19 ENCOUNTER — Ambulatory Visit (INDEPENDENT_AMBULATORY_CARE_PROVIDER_SITE_OTHER): Payer: Medicare Other | Admitting: Family Medicine

## 2020-06-19 ENCOUNTER — Encounter (INDEPENDENT_AMBULATORY_CARE_PROVIDER_SITE_OTHER): Payer: Self-pay | Admitting: Family Medicine

## 2020-06-19 ENCOUNTER — Other Ambulatory Visit: Payer: Self-pay

## 2020-06-19 VITALS — BP 140/75 | HR 68 | Temp 98.0°F | Resp 14 | Ht 63.0 in | Wt 162.0 lb

## 2020-06-19 DIAGNOSIS — E7849 Other hyperlipidemia: Secondary | ICD-10-CM

## 2020-06-19 DIAGNOSIS — R7989 Other specified abnormal findings of blood chemistry: Secondary | ICD-10-CM

## 2020-06-19 DIAGNOSIS — E039 Hypothyroidism, unspecified: Secondary | ICD-10-CM

## 2020-06-19 DIAGNOSIS — Z6836 Body mass index (BMI) 36.0-36.9, adult: Secondary | ICD-10-CM

## 2020-06-19 DIAGNOSIS — E559 Vitamin D deficiency, unspecified: Secondary | ICD-10-CM

## 2020-06-19 DIAGNOSIS — I10 Essential (primary) hypertension: Secondary | ICD-10-CM | POA: Diagnosis not present

## 2020-06-20 LAB — CMP14+EGFR
ALT: 26 IU/L (ref 0–32)
AST: 21 IU/L (ref 0–40)
Albumin/Globulin Ratio: 1.9 (ref 1.2–2.2)
Albumin: 4.6 g/dL (ref 3.8–4.8)
Alkaline Phosphatase: 97 IU/L (ref 44–121)
BUN/Creatinine Ratio: 27 (ref 12–28)
BUN: 20 mg/dL (ref 8–27)
Bilirubin Total: 0.5 mg/dL (ref 0.0–1.2)
CO2: 21 mmol/L (ref 20–29)
Calcium: 9.7 mg/dL (ref 8.7–10.3)
Chloride: 100 mmol/L (ref 96–106)
Creatinine, Ser: 0.75 mg/dL (ref 0.57–1.00)
Globulin, Total: 2.4 g/dL (ref 1.5–4.5)
Glucose: 83 mg/dL (ref 65–99)
Potassium: 4.5 mmol/L (ref 3.5–5.2)
Sodium: 137 mmol/L (ref 134–144)
Total Protein: 7 g/dL (ref 6.0–8.5)
eGFR: 86 mL/min/{1.73_m2} (ref >59)

## 2020-06-20 LAB — LIPID PANEL WITH LDL/HDL RATIO
Cholesterol, Total: 188 mg/dL (ref 100–199)
HDL: 59 mg/dL (ref >39)
LDL Chol Calc (NIH): 106 mg/dL — ABNORMAL HIGH (ref 0–99)
LDL/HDL Ratio: 1.8 ratio (ref 0.0–3.2)
Triglycerides: 130 mg/dL (ref 0–149)
VLDL Cholesterol Cal: 23 mg/dL (ref 5–40)

## 2020-06-20 LAB — T4, FREE: Free T4: 1 ng/dL (ref 0.82–1.77)

## 2020-06-20 LAB — VITAMIN D 25 HYDROXY (VIT D DEFICIENCY, FRACTURES): Vit D, 25-Hydroxy: 48.7 ng/mL (ref 30.0–100.0)

## 2020-06-20 LAB — TSH: TSH: 3.22 u[IU]/mL (ref 0.450–4.500)

## 2020-06-20 LAB — T3: T3, Total: 63 ng/dL — ABNORMAL LOW (ref 71–180)

## 2020-06-26 NOTE — Progress Notes (Signed)
Chief Complaint:   OBESITY Jennifer [name] is here to discuss her progress with her obesity treatment plan along with follow-up of her obesity related diagnoses. Jennifer [name] is on the Category 1 Plan and states she is following her eating plan approximately 75% of the time. Jennifer [name] states she is doing 0 minutes 0 times per week.  Today's visit was #: 68 Starting weight: 208 lbs Starting date: 08/25/2018 Today's weight: 162 lbs Today's date: 06/19/2020 Total lbs lost to date: 46 Total lbs lost since last in-office visit: 1  Interim History: Jennifer [name] had a lot of stress with the recent news that she has to move from a home she has lived in for 30+ years. She notes some stress eating but she made much better choices than she would have done in the past, and she avoided weight gain. She even lost another lb but she is making sure to eat healthy overall and not skipping meals.  Subjective:   1. Vitamin D deficiency Jennifer [name] is on Vit D, and she [place] due to have labs checked. She denies signs of over-replacement.  2. Elevated LFTs Jennifer [name] has a history of elevated LFT, and her last labs were within normal limits.  3. Hyperlipidemia, pure Jennifer [name] is on Pravachol, and she is working on diet and weight loss.  4. Hypothyroidism, unspecified type Jennifer [name] is on levothyroxine 50 mcg daily. She is due to have labs checked. She denies signs of over or under-replacement.  5. Essential hypertension Jennifer [name]'s blood pressure is mildly elevated today. She is due for labs, and she is working on diet and weight loss.  Assessment/Plan:   1. Vitamin D deficiency Low Vitamin D level contributes to fatigue and are associated with obesity, breast, and colon cancer. We will check labs today. Jennifer [name] will follow-up for routine testing of Vitamin D, at least 2-3 times per year to avoid over-replacement.  - VITAMIN D 25 Hydroxy (Vit-D Deficiency, Fractures)  2. Elevated LFTs We discussed the likely diagnosis of  non-alcoholic fatty liver disease today and how this condition is obesity related. Jennifer [name] was educated the importance of weight loss. We will check labs today. Jennifer [name] agreed to continue with her weight loss efforts with healthier diet and exercise as an essential part of her treatment plan.  3. Hyperlipidemia, pure Cardiovascular risk and specific lipid/LDL goals reviewed. We discussed several lifestyle modifications today. We will check labs today. Jennifer [name] will continue Pravachol, and will continue to work on diet, exercise and weight loss efforts. Orders and follow up as documented in patient record.   - Lipid Panel With LDL/HDL Ratio  4. Hypothyroidism, unspecified type We will check labs today. Jennifer [name] will continue to follow up as directed. Orders and follow up as documented in patient record.   - TSH - T4, free - T3  5. Essential hypertension We will check labs today. Jennifer [name] will continue working on diet and exercise to improve blood pressure control. We will watch for signs of hypotension as she continues her lifestyle modifications.  - CMP14+EGFR  6. Obesity with current BMI 28.7 Jennifer [name] is currently in the action stage of change. As such, her goal is to continue with weight loss efforts. She has agreed to the Category 1 Plan.   Behavioral modification strategies: emotional eating strategies.  Jennifer [name] has agreed to follow-up with our clinic in 4 weeks. She was informed of the importance of frequent follow-up visits to maximize her success with intensive lifestyle modifications for her multiple health conditions.   Jennifer [name]  was informed we would discuss her lab results at her next visit unless there is a critical issue that needs to be addressed sooner. Jennifer [name] agreed to keep her next visit at the agreed upon time to discuss these results.  Objective:   Blood pressure 140/75, pulse 68, temperature 98 F (36.7 C), resp. rate 14, height '5\' 3"'  (1.6 m), weight 162 lb (73.5 kg),  SpO2 98 %. Body mass index is 28.7 kg/m.  General: Cooperative, alert, well developed, in no acute distress. HEENT: Conjunctivae and lids unremarkable. Cardiovascular: Regular rhythm.  Lungs: Normal work of breathing. Neurologic: No focal deficits.   Lab Results  Component Value Date   CREATININE 0.75 06/19/2020   BUN 20 06/19/2020   NA 137 06/19/2020   K 4.5 06/19/2020   CL 100 06/19/2020   CO2 21 06/19/2020   Lab Results  Component Value Date   ALT 26 06/19/2020   AST 21 06/19/2020   ALKPHOS 97 06/19/2020   BILITOT 0.5 06/19/2020   Lab Results  Component Value Date   HGBA1C 5.2 01/17/2020   HGBA1C 5.4 05/05/2019   HGBA1C 5.4 12/14/2018   HGBA1C 5.4 08/25/2018   HGBA1C 5.2 09/16/2017   Lab Results  Component Value Date   INSULIN 4.7 01/17/2020   INSULIN 13.5 05/05/2019   INSULIN 12.8 12/14/2018   INSULIN 14.7 08/25/2018   Lab Results  Component Value Date   TSH 3.220 06/19/2020   Lab Results  Component Value Date   CHOL 188 06/19/2020   HDL 59 06/19/2020   LDLCALC 106 (H) 06/19/2020   LDLDIRECT 96.0 05/04/2018   TRIG 130 06/19/2020   CHOLHDL 3.5 05/05/2019   Lab Results  Component Value Date   WBC 4.0 11/01/2019   HGB 13.0 11/01/2019   HCT 37.8 11/01/2019   MCV 89.9 11/01/2019   PLT 311.0 11/01/2019   Lab Results  Component Value Date   IRON 110 11/01/2019   TIBC 382 11/01/2019    Obesity Behavioral Intervention:   Approximately 15 minutes were spent on the discussion below.  ASK: We discussed the diagnosis of obesity with Jennifer [name] today and Jennifer [name] agreed to give [place] permission to discuss obesity behavioral modification therapy today.  ASSESS: Jennifer [name] has the diagnosis of obesity and her BMI today is 28.7. Jennifer [name] is in the action stage of change.   ADVISE: Jennifer [name] was educated on the multiple health risks of obesity as well as the benefit of weight loss to improve her health. She was advised of the need for long term treatment and the  importance of lifestyle modifications to improve her current health and to decrease her risk of future health problems.  AGREE: Multiple dietary modification options and treatment options were discussed and Jennifer [name] agreed to follow the recommendations documented in the above note.  ARRANGE: [place] was educated on the importance of frequent visits to treat obesity as outlined per CMS and USPSTF guidelines and agreed to schedule her next follow up appointment today.  Attestation Statements:   Reviewed by clinician on day of visit: allergies, medications, problem list, medical history, surgical history, family history, social history, and previous encounter notes.   I, Trixie Dredge, am acting as transcriptionist for Dennard Nip, MD.  I have reviewed the above documentation for accuracy and completeness, and I agree with the above. -  Dennard Nip, MD

## 2020-07-17 ENCOUNTER — Ambulatory Visit (INDEPENDENT_AMBULATORY_CARE_PROVIDER_SITE_OTHER): Payer: Medicare Other | Admitting: Family Medicine

## 2020-07-27 ENCOUNTER — Encounter (INDEPENDENT_AMBULATORY_CARE_PROVIDER_SITE_OTHER): Payer: Self-pay | Admitting: Family Medicine

## 2020-07-27 ENCOUNTER — Other Ambulatory Visit: Payer: Self-pay

## 2020-07-27 ENCOUNTER — Ambulatory Visit (INDEPENDENT_AMBULATORY_CARE_PROVIDER_SITE_OTHER): Payer: Medicare Other | Admitting: Family Medicine

## 2020-07-27 VITALS — BP 136/68 | HR 61 | Temp 98.2°F | Ht 63.0 in | Wt 167.0 lb

## 2020-07-27 DIAGNOSIS — E038 Other specified hypothyroidism: Secondary | ICD-10-CM | POA: Diagnosis not present

## 2020-07-27 DIAGNOSIS — F439 Reaction to severe stress, unspecified: Secondary | ICD-10-CM

## 2020-07-27 DIAGNOSIS — Z6836 Body mass index (BMI) 36.0-36.9, adult: Secondary | ICD-10-CM

## 2020-07-28 ENCOUNTER — Encounter: Payer: Self-pay | Admitting: Family Medicine

## 2020-07-28 NOTE — Telephone Encounter (Signed)
Pt is asking that you review most recent TSH as well as 1 previous to that and advise any medication changes at this time as levels have been out of balance

## 2020-08-03 DIAGNOSIS — Z96641 Presence of right artificial hip joint: Secondary | ICD-10-CM | POA: Diagnosis not present

## 2020-08-03 DIAGNOSIS — Z96652 Presence of left artificial knee joint: Secondary | ICD-10-CM | POA: Diagnosis not present

## 2020-08-03 DIAGNOSIS — Z96651 Presence of right artificial knee joint: Secondary | ICD-10-CM | POA: Diagnosis not present

## 2020-08-03 DIAGNOSIS — Z471 Aftercare following joint replacement surgery: Secondary | ICD-10-CM | POA: Diagnosis not present

## 2020-08-03 NOTE — Progress Notes (Signed)
Chief Complaint:   OBESITY Jennifer [name] is here to discuss her progress with her obesity treatment plan along with follow-up of her obesity related diagnoses. Jennifer [name] is on the Category 1 Plan and states she is following her eating plan approximately 50% of the time. Jennifer [name] states she is doing 0 minutes 0 times per week.  Today's visit was #: 28 Starting weight: 208 lbs Starting date: 08/25/2018 Today's weight: 167 lbs Today's date: 07/27/2020 Total lbs lost to date: 41 Total lbs lost since last in-office visit: 0  Interim History: Jennifer [name] has been traveling and on vacation, and she has done more celebration eating this last month. She has tried to be mindful and minimize vacation weight gain.  Subjective:   1. Other specified hypothyroidism Jennifer [name] is on Synthroid, and her last TSH was within normal limits but her T3 was slightly low.  2. Stress Jennifer [name] is having to move unexpectedly and this is naturally very stressful. She is working on dealing with this stress in a more healthy way. She is sleeping well on her CPAP approximately 7 hours per night.  Assessment/Plan:   1. Other specified hypothyroidism Jennifer [name] is to contact Dr. Nyoka Cowden to see if he wants to make changes. It is unlikely that this is contributing to her weight at this time. Orders and follow up as documented in patient record.  2. Stress Jennifer [name] was offered support  and encouragement. She will continue to monitor her mood, and emotional eating strategies were discussed..  3. Obesity with current BMI 29.7 Jennifer [name] is currently in the action stage of change. As such, her goal is to continue with weight loss efforts. She has agreed to the Category 1 Plan.   Behavioral modification strategies: increasing lean protein intake, increasing vegetables, and emotional eating strategies.  Jennifer [name] has agreed to follow-up with our clinic in 3 to 4 weeks. She was informed of the importance of frequent follow-up visits to maximize  her success with intensive lifestyle modifications for her multiple health conditions.   Objective:   Blood pressure 136/68, pulse 61, temperature 98.2 F (36.8 C), height '5\' 3"'$  (1.6 m), weight 167 lb (75.8 kg), SpO2 95 %. Body mass index is 29.58 kg/m.  General: Cooperative, alert, well developed, in no acute distress. HEENT: Conjunctivae and lids unremarkable. Cardiovascular: Regular rhythm.  Lungs: Normal work of breathing. Neurologic: No focal deficits.   Lab Results  Component Value Date   CREATININE 0.75 06/19/2020   BUN 20 06/19/2020   NA 137 06/19/2020   K 4.5 06/19/2020   CL 100 06/19/2020   CO2 21 06/19/2020   Lab Results  Component Value Date   ALT 26 06/19/2020   AST 21 06/19/2020   ALKPHOS 97 06/19/2020   BILITOT 0.5 06/19/2020   Lab Results  Component Value Date   HGBA1C 5.2 01/17/2020   HGBA1C 5.4 05/05/2019   HGBA1C 5.4 12/14/2018   HGBA1C 5.4 08/25/2018   HGBA1C 5.2 09/16/2017   Lab Results  Component Value Date   INSULIN 4.7 01/17/2020   INSULIN 13.5 05/05/2019   INSULIN 12.8 12/14/2018   INSULIN 14.7 08/25/2018   Lab Results  Component Value Date   TSH 3.220 06/19/2020   Lab Results  Component Value Date   CHOL 188 06/19/2020   HDL 59 06/19/2020   LDLCALC 106 (H) 06/19/2020   LDLDIRECT 96.0 05/04/2018   TRIG 130 06/19/2020   CHOLHDL 3.5 05/05/2019   Lab Results  Component Value Date   VD25OH 48.7 06/19/2020  VD25OH 48.0 01/17/2020   VD25OH 54.5 08/30/2019   Lab Results  Component Value Date   WBC 4.0 11/01/2019   HGB 13.0 11/01/2019   HCT 37.8 11/01/2019   MCV 89.9 11/01/2019   PLT 311.0 11/01/2019   Lab Results  Component Value Date   IRON 110 11/01/2019   TIBC 382 11/01/2019   Attestation Statements:   Reviewed by clinician on day of visit: allergies, medications, problem list, medical history, surgical history, family history, social history, and previous encounter notes.  Time spent on visit including pre-visit  chart review and post-visit care and charting was 32 minutes.    I, Trixie Dredge, am acting as transcriptionist for Dennard Nip, MD.  I have reviewed the above documentation for accuracy and completeness, and I agree with the above. -  Dennard Nip, MD

## 2020-08-16 ENCOUNTER — Encounter (INDEPENDENT_AMBULATORY_CARE_PROVIDER_SITE_OTHER): Payer: Self-pay | Admitting: Adult Health

## 2020-08-16 ENCOUNTER — Other Ambulatory Visit: Payer: Self-pay

## 2020-08-16 ENCOUNTER — Ambulatory Visit (INDEPENDENT_AMBULATORY_CARE_PROVIDER_SITE_OTHER): Payer: Medicare Other | Admitting: Adult Health

## 2020-08-16 VITALS — BP 148/76 | HR 67 | Temp 97.7°F | Ht 63.0 in | Wt 164.0 lb

## 2020-08-16 DIAGNOSIS — I10 Essential (primary) hypertension: Secondary | ICD-10-CM

## 2020-08-16 DIAGNOSIS — Z6836 Body mass index (BMI) 36.0-36.9, adult: Secondary | ICD-10-CM

## 2020-08-16 NOTE — Progress Notes (Signed)
Chief Complaint:   OBESITY Jennifer [name] is here to discuss her progress with her obesity treatment plan along with follow-up of her obesity related diagnoses. Jennifer [name] is on the Category 1 Plan and states she is following her eating plan approximately 70% of the time. Jennifer [name] states she is not exercising regularly.  Today's visit was #: 62 Starting weight: 208 lbs Starting date: 08/25/2018 Today's weight: 164 lbs Today's date: 08/16/2020 Total lbs lost to date: 44 lbs Total lbs lost since last in-office visit: 3 lbs  Interim History: [place] did not have to move - she was granted a lease extension.  She continues to travel frequently - [place], [place] - will be traveling to [place] next week for 7 days.  Reviewed body composition with patient.  Subjective:   1. Essential hypertension She has not taken daily losartan 25 mg, and rushed into the clinic.  BP slightly elevated at office visit.  She denies acute cardiac symptoms.  BP Readings from Last 3 Encounters:  08/16/20 (!) 148/76  07/27/20 136/68  06/19/20 140/75   Assessment/Plan:   1. Essential hypertension Jennifer [name] is working on healthy weight loss and exercise to improve blood pressure control. We will watch for signs of hypotension as she continues her lifestyle modifications.  Continue Category 1 meal plan and daily losartan.  2. Obesity with current BMI 29.2  Jennifer [name] is currently in the action stage of change. As such, her goal is to continue with weight loss efforts. She has agreed to the Category 1 Plan.   Exercise goals:  As is.  Behavioral modification strategies: increasing lean protein intake, decreasing simple carbohydrates, meal planning and cooking strategies, keeping healthy foods in the home, travel eating strategies, and planning for success.  Jennifer [name] has agreed to follow-up with our clinic in 3-4 weeks. She was informed of the importance of frequent follow-up visits to maximize her success with intensive lifestyle  modifications for her multiple health conditions.   Objective:   Blood pressure (!) 148/76, pulse 67, temperature 97.7 F (36.5 C), height '5\' 3"'$  (1.6 m), weight 164 lb (74.4 kg), SpO2 96 %. Body mass index is 29.05 kg/m.  General: Cooperative, alert, well developed, in no acute distress. HEENT: Conjunctivae and lids unremarkable. Cardiovascular: Regular rhythm.  Lungs: Normal work of breathing. Neurologic: No focal deficits.   Lab Results  Component Value Date   CREATININE 0.75 06/19/2020   BUN 20 06/19/2020   NA 137 06/19/2020   K 4.5 06/19/2020   CL 100 06/19/2020   CO2 21 06/19/2020   Lab Results  Component Value Date   ALT 26 06/19/2020   AST 21 06/19/2020   ALKPHOS 97 06/19/2020   BILITOT 0.5 06/19/2020   Lab Results  Component Value Date   HGBA1C 5.2 01/17/2020   HGBA1C 5.4 05/05/2019   HGBA1C 5.4 12/14/2018   HGBA1C 5.4 08/25/2018   HGBA1C 5.2 09/16/2017   Lab Results  Component Value Date   INSULIN 4.7 01/17/2020   INSULIN 13.5 05/05/2019   INSULIN 12.8 12/14/2018   INSULIN 14.7 08/25/2018   Lab Results  Component Value Date   TSH 3.220 06/19/2020   Lab Results  Component Value Date   CHOL 188 06/19/2020   HDL 59 06/19/2020   LDLCALC 106 (H) 06/19/2020   LDLDIRECT 96.0 05/04/2018   TRIG 130 06/19/2020   CHOLHDL 3.5 05/05/2019   Lab Results  Component Value Date   VD25OH 48.7 06/19/2020   VD25OH 48.0 01/17/2020   VD25OH 54.5  08/30/2019   Lab Results  Component Value Date   WBC 4.0 11/01/2019   HGB 13.0 11/01/2019   HCT 37.8 11/01/2019   MCV 89.9 11/01/2019   PLT 311.0 11/01/2019   Lab Results  Component Value Date   IRON 110 11/01/2019   TIBC 382 11/01/2019   Attestation Statements:   Reviewed by clinician on day of visit: allergies, medications, problem list, medical history, surgical history, family history, social history, and previous encounter notes.  Time spent on visit including pre-visit chart review and post-visit care  and charting was 28 minutes.   I, Water quality scientist, CMA, am acting as Location manager for Mina Marble, NP.  I have reviewed the above documentation for accuracy and completeness, and I agree with the above. -  Dawayne Ohair d. Remmy Crass,NP-C

## 2020-08-17 ENCOUNTER — Encounter (INDEPENDENT_AMBULATORY_CARE_PROVIDER_SITE_OTHER): Payer: Self-pay | Admitting: Adult Health

## 2020-08-17 NOTE — Telephone Encounter (Signed)
FYI

## 2020-08-20 ENCOUNTER — Other Ambulatory Visit: Payer: Self-pay | Admitting: Family Medicine

## 2020-08-20 DIAGNOSIS — E039 Hypothyroidism, unspecified: Secondary | ICD-10-CM

## 2020-08-29 DIAGNOSIS — Z1231 Encounter for screening mammogram for malignant neoplasm of breast: Secondary | ICD-10-CM | POA: Diagnosis not present

## 2020-08-29 LAB — HM MAMMOGRAPHY

## 2020-09-04 ENCOUNTER — Encounter: Payer: Self-pay | Admitting: Family Medicine

## 2020-09-05 ENCOUNTER — Other Ambulatory Visit: Payer: Self-pay

## 2020-09-05 ENCOUNTER — Encounter: Payer: Self-pay | Admitting: Neurology

## 2020-09-05 ENCOUNTER — Ambulatory Visit (INDEPENDENT_AMBULATORY_CARE_PROVIDER_SITE_OTHER): Payer: Medicare Other | Admitting: Neurology

## 2020-09-05 DIAGNOSIS — G40009 Localization-related (focal) (partial) idiopathic epilepsy and epileptic syndromes with seizures of localized onset, not intractable, without status epilepticus: Secondary | ICD-10-CM

## 2020-09-05 MED ORDER — LEVETIRACETAM 500 MG PO TABS: 500.0000 mg | ORAL_TABLET | Freq: Two times a day (BID) | ORAL | 3 refills | Status: DC

## 2020-09-05 NOTE — Patient Instructions (Signed)
Always good to see you. Continue Keppra 500mg twice a day. Follow-up in 1 year, call for any changes.  ° ° °Seizure Precautions: °1. If medication has been prescribed for you to prevent seizures, take it exactly as directed.  Do not stop taking the medicine without talking to your doctor first, even if you have not had a seizure in a long time.  ° °2. Avoid activities in which a seizure would cause danger to yourself or to others.  Don't operate dangerous machinery, swim alone, or climb in high or dangerous places, such as on ladders, roofs, or girders.  Do not drive unless your doctor says you may. ° °3. If you have any warning that you may have a seizure, lay down in a safe place where you can't hurt yourself.   ° °4.  No driving for 6 months from last seizure, as per [place] state law.   Please refer to the following link on the [employer]'s website for more information: http://www.epilepsyfoundation.org/answerplace/Social/driving/drivingu.cfm  ° °5.  Maintain good sleep hygiene. Avoid alcohol. ° °6.  Contact your doctor if you have any problems that may be related to the medicine you are taking. ° °7.  Call 911 and bring the patient back to the ED if: °      ° A.  The seizure lasts longer than 5 minutes.      ° B.  The patient doesn't awaken shortly after the seizure ° C.  The patient has new problems such as difficulty seeing, speaking or moving ° D.  The patient was injured during the seizure ° E.  The patient has a temperature over 102 F (39C) ° F.  The patient vomited and now is having trouble breathing °      ° °
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
## 2020-09-05 NOTE — Telephone Encounter (Signed)
I have 4 open spots tomorrow on my schedule.  This would be appropriate for same-day visit, or can use hospital follow-up slot if needed.  Please contact her and see if she is able to come in tomorrow to be seen.  Thanks.

## 2020-09-06 ENCOUNTER — Ambulatory Visit (INDEPENDENT_AMBULATORY_CARE_PROVIDER_SITE_OTHER): Payer: Medicare Other | Admitting: Family Medicine

## 2020-09-06 ENCOUNTER — Encounter: Payer: Self-pay | Admitting: Family Medicine

## 2020-09-06 ENCOUNTER — Other Ambulatory Visit: Payer: Self-pay

## 2020-09-06 VITALS — BP 128/82 | HR 60 | Temp 98.2°F | Resp 17 | Ht 63.0 in | Wt 172.6 lb

## 2020-09-06 DIAGNOSIS — G40009 Localization-related (focal) (partial) idiopathic epilepsy and epileptic syndromes with seizures of localized onset, not intractable, without status epilepticus: Secondary | ICD-10-CM

## 2020-09-06 DIAGNOSIS — R21 Rash and other nonspecific skin eruption: Secondary | ICD-10-CM

## 2020-09-06 MED ORDER — LEVETIRACETAM 500 MG PO TABS: 500.0000 mg | ORAL_TABLET | Freq: Two times a day (BID) | ORAL | 3 refills | Status: DC

## 2020-09-06 NOTE — Progress Notes (Signed)
Subjective:  Patient ID: Jennifer [name], female    DOB: 07/10/1950  Age: 70 y.o. MRN: [id number]  CC:  Chief Complaint  Patient presents with   Rash    Pt reports has rash on her abdomen and top of her legs, pt notes mostly under her extra abdominal skin, notes cat recently had fleas and was treated but unsure if this started rash. Started 3 weeks ago location has migrated     HPI [name] presents for  Rash: Initially 3 weeks ago.  Lower abdomen area, slight itching, slight irritation.  Noted on both sides with small bumps, possible blisters but again bilateral. As above - cat had fleas that have been treated.  But no leg, arm, or other areas of lesions.  No fever/malaise.  Uses medicated powder daily to the intertriginous skin for years.  No new treatments, no new topicals, no new dermatologic products.  Initial areas improved, then occasional new area will pop up.  Has been exercising, possibly more sweating in that area.  Does not shave the area.  Has leftover triamcinolone from other condition, not using.     History Patient Active Problem List   Diagnosis Date Noted   Elevated LFTs 11/01/2019   Abrasion of left buttock 08/05/2018   S/P total knee arthroplasty, left 07/20/2018   Degenerative arthritis of left knee 07/17/2018   Chronic constipation 12/20/2017   Class 1 obesity due to excess calories without serious comorbidity with body mass index (BMI) of 34.0 to 34.9 in adult 12/20/2017   Primary osteoarthritis of right hip 10/20/2015   Arthritis of right hip 10/20/2015   Localization-related idiopathic epilepsy and epileptic syndromes with seizures of localized onset, not intractable, without status epilepticus ([place]) 09/09/2014   Depression 07/19/2014   Hx of adenomatous polyp of colon 03/29/2014   Seizure after head injury ([place]) 08/29/2013   OSA (obstructive sleep apnea) 04/24/2012   PLMD (periodic limb movement disorder) 04/24/2012   Fibroids,  submucosal 12/18/2010   HYPERLIPIDEMIA 07/04/2008   Essential hypertension 07/04/2008   BAKER'S CYST, LEFT KNEE 07/04/2008   Shortened PR interval 07/04/2008   Past Medical History:  Diagnosis Date   Allergy    Anxiety    Constipation    Depression    Dysrhythmia    FLB - ? PAC'S COMES AND GOES   Gallbladder problem    GERD (gastroesophageal reflux disease)    High cholesterol    Hx of adenomatous polyp of colon    2012 - [facility]   Hyperlipidemia    Hypertension    Hypothyroid    Joint pain    no current problems   Osteoarthritis    PLMD (periodic limb movement disorder) 04/24/2012   Prediabetes    Seizures ([place]) 08/29/2013   only 1 seizure episode   Sleep apnea    wears CPAP nightly   Vitamin D deficiency    Past Surgical History:  Procedure Laterality Date   APPENDECTOMY     CESAREAN SECTION      x1   CHOLECYSTECTOMY  1978   COLONOSCOPY     DILATION AND CURETTAGE OF UTERUS     JOINT REPLACEMENT  1998   right knee   OSTEOTOMY PROXIMAL FEMORAL     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   Procedure: TOTAL HIP ARTHROPLASTY ANTERIOR APPROACH;  Surgeon: Frederik Pear, MD;  Location: [facility];  Service: Orthopedics;  Laterality: Right;   TOTAL KNEE ARTHROPLASTY Left 07/20/2018   Procedure: Left Knee Arthroplasty;  Surgeon: Frederik Pear, MD;  Location: [facility];  Service: Orthopedics;  Laterality: Left;   Allergies  Allergen Reactions   Crestor [Rosuvastatin] Other (See Comments)    Muscle aches   Neomycin-Bacitracin Zn-Polymyx Swelling    OPTHALMIC EXPOSURE ONLY - - SWELLING EYES    Neomycin-Bacitracin-Polymyxin [Bacitracin-Neomycin-Polymyxin] Swelling    Reaction to eye drops   Codeine Rash   Feldene [Piroxicam] Rash   Penicillins Rash    Has patient had a PCN reaction causing immediate rash, facial/tongue/throat swelling, SOB or lightheadedness with hypotension:Yes Has patient had a PCN reaction causing severe rash involving mucus membranes or skin necrosis: No Has  patient had a PCN reaction that required hospitalization:No Has patient had a PCN reaction occurring within the last 10 years:No If all of the above answers are "NO", then may proceed with Cephalosporin use.    Piroxicam Rash   Sulfa Antibiotics Rash   Sulfonamide Derivatives Rash   Prior to Admission medications   Medication Sig Start Date End Date Taking? Authorizing Provider  Biotin 1000 MCG tablet Take 1,000 mcg by mouth daily.   Yes [provider]  CALCIUM CARBONATE PO Take 600 mg by mouth daily.    Yes [provider]  cetirizine (ZYRTEC) 10 MG tablet Take 10 mg by mouth every evening.    Yes [provider]  Cholecalciferol (VITAMIN D) 50 MCG (2000 UT) tablet Take 2,000 Units by mouth daily.   Yes [provider]  citalopram (CELEXA) 40 MG tablet Take 1 tablet (40 mg total) by mouth daily. 06/08/20  Yes Wendie Agreste, MD  hydroxypropyl methylcellulose / hypromellose (ISOPTO TEARS / GONIOVISC) 2.5 % ophthalmic solution Place 1 drop into both eyes 2 (two) times daily as needed for dry eyes.   Yes [provider]  ketoconazole (NIZORAL) 2 % cream Apply 1 application topically daily. 03/03/19  Yes [provider]  levETIRAcetam (KEPPRA) 500 MG tablet Take 1 tablet (500 mg total) by mouth 2 (two) times daily. 09/06/20  Yes Cameron Sprang, MD  levothyroxine (SYNTHROID) 50 MCG tablet TAKE 1 TABLET BY MOUTH EVERY DAY 08/21/20  Yes Wendie Agreste, MD  losartan (COZAAR) 25 MG tablet Take 2 tablets (50 mg total) by mouth daily. 06/08/20  Yes Wendie Agreste, MD  Multiple Vitamins-Minerals (MULTIPLE VITAMINS/WOMENS PO) Take 1 tablet by mouth daily.    Yes [provider]  pravastatin (PRAVACHOL) 80 MG tablet TAKE 1 TABLET BY MOUTH EVERY DAY 10/12/19  Yes Elby Beck, FNP  triamcinolone cream (KENALOG) 0.1 % Apply 1 application topically 2 (two) times daily. 06/08/20  Yes Wendie Agreste, MD  valACYclovir (VALTREX) 1000 MG  tablet Take 1 tablet (1,000 mg total) by mouth 2 (two) times daily. 07/02/19  Yes Elby Beck, FNP   Social History   Socioeconomic History   Marital status: Divorced    Spouse name: Not on file   Number of children: 1   Years of education: Not on file   Highest education level: Not on file  Occupational History   Occupation: Retired    Employer: [employer]  Tobacco Use   Smoking status: Never   Smokeless tobacco: Never  Vaping Use   Vaping Use: Never used  Substance and Sexual Activity   Alcohol use: No    Alcohol/week: 0.0 standard drinks   Drug use: No   Sexual activity: Never    Birth control/protection: Post-menopausal  Other Topics Concern   Not on file  Social History  Narrative   Lives with blind dog and cat.    Right handed    Social Determinants of Health   Financial Resource Strain: Not on file  Food Insecurity: Not on file  Transportation Needs: Not on file  Physical Activity: Not on file  Stress: Not on file  Social Connections: Not on file  Intimate Partner Violence: Not on file    Review of Systems   Objective:   Vitals:   09/06/20 1035  BP: 128/82  Pulse: 60  Resp: 17  Temp: 98.2 F (36.8 C)  TempSrc: Temporal  SpO2: 97%  Weight: 172 lb 9.6 oz (78.3 kg)  Height: '5\' 3"'$  (1.6 m)     Physical Exam Constitutional:      General: She is not in acute distress.    Appearance: Normal appearance. She is well-developed.  HENT:     Head: Normocephalic and atraumatic.  Cardiovascular:     Rate and Rhythm: Normal rate.  Pulmonary:     Effort: Pulmonary effort is normal.  Skin:      Neurological:     Mental Status: She is alert and oriented to person, place, and time.  Psychiatric:        Mood and Affect: Mood normal.       Assessment & Plan:  LEEYANA STELMACK is a 70 y.o. female . Rash and nonspecific skin eruption Possible slight irritated area with previous sweating, intertrigo, but overall reassuring exam at  present.  Treatment options discussed with continued medicated powder, Polysporin over-the-counter if irritated/erythematous area with RTC precautions.  Triamcinolone that she has at home if itching or irritated.  Continue treatment/prevention for intertrigo with handout given, RTC precautions given.  No orders of the defined types were placed in this encounter.  Patient Instructions  Few small areas may have been some irritation from sweating, but overall the skin looks to be doing well.  Okay to use Polysporin antibiotic ointment if you do have a small bump or irritated area, triamcinolone cream that you have at home if it is itching or irritation.  If any surrounding redness or worsening symptoms please return to be seen.  Okay to keep up with the medicated powder to help treat any possible intertrigo, see information on that condition below.  Thanks for coming in today and keep me posted.  Intertrigo Intertrigo is skin irritation or inflammation (dermatitis) that occurs when folds of skin rub together. The irritation can cause a rash and make skin raw and itchy. This condition most commonly occurs in the skin folds of these areas: Toes. Armpits. Groin. Under the belly. Under the breasts. Buttocks. Intertrigo is not passed from person to person (is not contagious). What are the causes? This condition is caused by heat, moisture, rubbing (friction), and not enough air circulation. The condition can be made worse by: Sweat. Bacteria. A fungus, such as yeast. What increases the risk? This condition is more likely to occur if you have moisture in your skin folds. You are more likely to develop this condition if you: Have diabetes. Are overweight. Are not able to move around or are not active. Live in a warm and moist climate. Wear splints, braces, or other medical devices. Are not able to control your bowels or bladder (have incontinence). What are the signs or symptoms? Symptoms of this  condition include: A pink or red skin rash in the skin fold or near the skin fold. Raw or scaly skin. Itchiness. A burning feeling. Bleeding. Leaking fluid.  A bad smell. How is this diagnosed? This condition is diagnosed with a medical history and physical exam. You may also have a skin swab to test for bacteria or a fungus. How is this treated? This condition may be treated by: Cleaning and drying your skin. Taking an antibiotic medicine or using an antibiotic skin cream for a bacterial infection. Using an antifungal cream on your skin or taking pills for an infection that was caused by a fungus, such as yeast. Using a steroid ointment to relieve itchiness and irritation. Separating the skin fold with a clean cotton cloth to absorb moisture and allow air to flow into the area. Follow these instructions at home: Keep the affected area clean and dry. Do not scratch your skin. Stay in a cool environment as much as possible. Use an air conditioner or fan, if available. Apply over-the-counter and prescription medicines only as told by your health care provider. If you were prescribed an antibiotic medicine, use it as told by your health care provider. Do not stop using the antibiotic even if your condition improves. Keep all follow-up visits as told by your health care provider. This is important. How is this prevented?  Maintain a healthy weight. Take care of your feet, especially if you have diabetes. Foot care includes: Wearing shoes that fit well. Keeping your feet dry. Wearing clean, breathable socks. Protect the skin around your groin and buttocks, especially if you have incontinence. Skin protection includes: Following a regular cleaning routine. Using skin protectant creams, powders, or ointments. Changing protection pads frequently. Do not wear tight clothes. Wear clothes that are loose, absorbent, and made of cotton. Wear a bra that gives good support, if needed. Shower and  dry yourself well after activity or exercise. Use a hair dryer on a cool setting to dry between skin folds, especially after you bathe. If you have diabetes, keep your blood sugar under control. Contact a health care provider if: Your symptoms do not improve with treatment. Your symptoms get worse or they spread. You notice increased redness and warmth. You have a fever. Summary Intertrigo is skin irritation or inflammation (dermatitis) that occurs when folds of skin rub together. This condition is caused by heat, moisture, rubbing (friction), and not enough air circulation. This condition may be treated by cleaning and drying your skin and with medicines. Apply over-the-counter and prescription medicines only as told by your health care provider. Keep all follow-up visits as told by your health care provider. This is important. This information is not intended to replace advice given to you by your health care provider. Make sure you discuss any questions you have with your health care provider. Document Revised: 05/19/2017 Document Reviewed: 05/26/2017 Elsevier Patient Education  2022 [place],   Merri Ray, MD [place], [facility] 09/06/20 11:27 AM

## 2020-09-06 NOTE — Patient Instructions (Signed)
Few small areas may have been some irritation from sweating, but overall the skin looks to be doing well.  Okay to use Polysporin antibiotic ointment if you do have a small bump or irritated area, triamcinolone cream that you have at home if it is itching or irritation.  If any surrounding redness or worsening symptoms please return to be seen.  Okay to keep up with the medicated powder to help treat any possible intertrigo, see information on that condition below.  Thanks for coming in today and keep me posted.  Intertrigo Intertrigo is skin irritation or inflammation (dermatitis) that occurs when folds of skin rub together. The irritation can cause a rash and make skin raw and itchy. This condition most commonly occurs in the skin folds of these areas: Toes. Armpits. Groin. Under the belly. Under the breasts. Buttocks. Intertrigo is not passed from person to person (is not contagious). What are the causes? This condition is caused by heat, moisture, rubbing (friction), and not enough air circulation. The condition can be made worse by: Sweat. Bacteria. A fungus, such as yeast. What increases the risk? This condition is more likely to occur if you have moisture in your skin folds. You are more likely to develop this condition if you: Have diabetes. Are overweight. Are not able to move around or are not active. Live in a warm and moist climate. Wear splints, braces, or other medical devices. Are not able to control your bowels or bladder (have incontinence). What are the signs or symptoms? Symptoms of this condition include: A pink or red skin rash in the skin fold or near the skin fold. Raw or scaly skin. Itchiness. A burning feeling. Bleeding. Leaking fluid. A bad smell. How is this diagnosed? This condition is diagnosed with a medical history and physical exam. You may also have a skin swab to test for bacteria or a fungus. How is this treated? This condition may be treated  by: Cleaning and drying your skin. Taking an antibiotic medicine or using an antibiotic skin cream for a bacterial infection. Using an antifungal cream on your skin or taking pills for an infection that was caused by a fungus, such as yeast. Using a steroid ointment to relieve itchiness and irritation. Separating the skin fold with a clean cotton cloth to absorb moisture and allow air to flow into the area. Follow these instructions at home: Keep the affected area clean and dry. Do not scratch your skin. Stay in a cool environment as much as possible. Use an air conditioner or fan, if available. Apply over-the-counter and prescription medicines only as told by your health care provider. If you were prescribed an antibiotic medicine, use it as told by your health care provider. Do not stop using the antibiotic even if your condition improves. Keep all follow-up visits as told by your health care provider. This is important. How is this prevented?  Maintain a healthy weight. Take care of your feet, especially if you have diabetes. Foot care includes: Wearing shoes that fit well. Keeping your feet dry. Wearing clean, breathable socks. Protect the skin around your groin and buttocks, especially if you have incontinence. Skin protection includes: Following a regular cleaning routine. Using skin protectant creams, powders, or ointments. Changing protection pads frequently. Do not wear tight clothes. Wear clothes that are loose, absorbent, and made of cotton. Wear a bra that gives good support, if needed. Shower and dry yourself well after activity or exercise. Use a hair dryer on a cool  setting to dry between skin folds, especially after you bathe. If you have diabetes, keep your blood sugar under control. Contact a health care provider if: Your symptoms do not improve with treatment. Your symptoms get worse or they spread. You notice increased redness and warmth. You have a  fever. Summary Intertrigo is skin irritation or inflammation (dermatitis) that occurs when folds of skin rub together. This condition is caused by heat, moisture, rubbing (friction), and not enough air circulation. This condition may be treated by cleaning and drying your skin and with medicines. Apply over-the-counter and prescription medicines only as told by your health care provider. Keep all follow-up visits as told by your health care provider. This is important. This information is not intended to replace advice given to you by your health care provider. Make sure you discuss any questions you have with your health care provider. Document Revised: 05/19/2017 Document Reviewed: 05/26/2017 Elsevier Patient Education  [employer].

## 2020-09-07 ENCOUNTER — Other Ambulatory Visit: Payer: Self-pay

## 2020-09-07 ENCOUNTER — Encounter (INDEPENDENT_AMBULATORY_CARE_PROVIDER_SITE_OTHER): Payer: Self-pay | Admitting: Family Medicine

## 2020-09-07 ENCOUNTER — Ambulatory Visit (INDEPENDENT_AMBULATORY_CARE_PROVIDER_SITE_OTHER): Payer: Medicare Other | Admitting: Family Medicine

## 2020-09-07 VITALS — BP 138/76 | HR 64 | Temp 98.6°F | Ht 63.0 in | Wt 165.0 lb

## 2020-09-07 DIAGNOSIS — Z6836 Body mass index (BMI) 36.0-36.9, adult: Secondary | ICD-10-CM

## 2020-09-07 DIAGNOSIS — I10 Essential (primary) hypertension: Secondary | ICD-10-CM | POA: Diagnosis not present

## 2020-09-07 NOTE — Progress Notes (Signed)
Chief Complaint:   OBESITY Jennifer [name] is here to discuss her progress with her obesity treatment plan along with follow-up of her obesity related diagnoses. Claren is on the Category 1 Plan and states she is following her eating plan approximately 80% of the time. Adelmira states she is walking for 30 minutes 2 times per week.  Today's visit was #: 66 Starting weight: 208 lbs Starting date: 08/25/2018 Today's weight: 165 lbs Today's date: 09/07/2020 Total lbs lost to date: 43 Total lbs lost since last in-office visit: 0  Interim History: Telecia has done more traveling in the last month. She worked on making better eating choices despite eating out more. She is working on increasing her walking.  Subjective:   1. Essential hypertension Twila's blood pressure is stable on her medications and with weight loss. She is walking more and she feels well overall.  Assessment/Plan:   1. Essential hypertension Dewana will continue with diet, exercise, and weight loss, and will continue to monitor for signs of hypotension as she continues her lifestyle modifications.  2. Obesity with current BMI 29.3 Ceren is currently in the action stage of change. As such, her goal is to continue with weight loss efforts. She has agreed to the Category 1 Plan.   Exercise goals: Increase walking, she is getting ready to do a 5K.  Behavioral modification strategies: increasing lean protein intake and travel eating strategies.  Vernie has agreed to follow-up with our clinic in 4 weeks. She was informed of the importance of frequent follow-up visits to maximize her success with intensive lifestyle modifications for her multiple health conditions.   Objective:   Blood pressure 138/76, pulse 64, temperature 98.6 F (37 C), height '5\' 3"'$  (1.6 m), weight 165 lb (74.8 kg), SpO2 96 %. Body mass index is 29.23 kg/m.  General: Cooperative, alert, well developed, in no acute distress. HEENT: Conjunctivae  and lids unremarkable. Cardiovascular: Regular rhythm.  Lungs: Normal work of breathing. Neurologic: No focal deficits.   Lab Results  Component Value Date   CREATININE 0.75 06/19/2020   BUN 20 06/19/2020   NA 137 06/19/2020   K 4.5 06/19/2020   CL 100 06/19/2020   CO2 21 06/19/2020   Lab Results  Component Value Date   ALT 26 06/19/2020   AST 21 06/19/2020   ALKPHOS 97 06/19/2020   BILITOT 0.5 06/19/2020   Lab Results  Component Value Date   HGBA1C 5.2 01/17/2020   HGBA1C 5.4 05/05/2019   HGBA1C 5.4 12/14/2018   HGBA1C 5.4 08/25/2018   HGBA1C 5.2 09/16/2017   Lab Results  Component Value Date   INSULIN 4.7 01/17/2020   INSULIN 13.5 05/05/2019   INSULIN 12.8 12/14/2018   INSULIN 14.7 08/25/2018   Lab Results  Component Value Date   TSH 3.220 06/19/2020   Lab Results  Component Value Date   CHOL 188 06/19/2020   HDL 59 06/19/2020   LDLCALC 106 (H) 06/19/2020   LDLDIRECT 96.0 05/04/2018   TRIG 130 06/19/2020   CHOLHDL 3.5 05/05/2019   Lab Results  Component Value Date   VD25OH 48.7 06/19/2020   VD25OH 48.0 01/17/2020   VD25OH 54.5 08/30/2019   Lab Results  Component Value Date   WBC 4.0 11/01/2019   HGB 13.0 11/01/2019   HCT 37.8 11/01/2019   MCV 89.9 11/01/2019   PLT 311.0 11/01/2019   Lab Results  Component Value Date   IRON 110 11/01/2019   TIBC 382 11/01/2019   Attestation Statements:  Reviewed by clinician on day of visit: allergies, medications, problem list, medical history, surgical history, family history, social history, and previous encounter notes.  Time spent on visit including pre-visit chart review and post-visit care and charting was 34 minutes.    I, Trixie Dredge, am acting as transcriptionist for Dennard Nip, MD.  I have reviewed the above documentation for accuracy and completeness, and I agree with the above. -  Dennard Nip, MD

## 2020-09-08 DIAGNOSIS — Z23 Encounter for immunization: Secondary | ICD-10-CM | POA: Diagnosis not present

## 2020-09-10 ENCOUNTER — Telehealth: Payer: Self-pay

## 2020-09-10 NOTE — Telephone Encounter (Signed)
Called to schedule Medicare AWV, had to leave message for patient to call office to schedule.

## 2020-09-13 ENCOUNTER — Encounter: Payer: Self-pay | Admitting: Family Medicine

## 2020-09-13 ENCOUNTER — Ambulatory Visit: Payer: Medicare Other | Admitting: Registered Nurse

## 2020-10-09 ENCOUNTER — Other Ambulatory Visit: Payer: Self-pay

## 2020-10-09 ENCOUNTER — Ambulatory Visit (INDEPENDENT_AMBULATORY_CARE_PROVIDER_SITE_OTHER): Payer: Medicare Other | Admitting: Family Medicine

## 2020-10-09 ENCOUNTER — Encounter (INDEPENDENT_AMBULATORY_CARE_PROVIDER_SITE_OTHER): Payer: Self-pay | Admitting: Family Medicine

## 2020-10-09 VITALS — BP 131/76 | HR 68 | Temp 98.1°F | Ht 63.0 in | Wt 168.0 lb

## 2020-10-09 DIAGNOSIS — Z6836 Body mass index (BMI) 36.0-36.9, adult: Secondary | ICD-10-CM | POA: Diagnosis not present

## 2020-10-09 DIAGNOSIS — I10 Essential (primary) hypertension: Secondary | ICD-10-CM

## 2020-10-09 NOTE — Progress Notes (Signed)
Chief Complaint:   OBESITY Jennifer [name] is here to discuss her progress with her obesity treatment plan along with follow-up of her obesity related diagnoses. Jennifer [name] is on the Category 1 Plan and states she is following her eating plan approximately 75% of the time. Jennifer [name] states she is doing 0 minutes 0 times per week.  Today's visit was #: 34 Starting weight: 208 lbs Starting date: 08/25/2018 Today's weight: 168 lbs Today's date: 10/09/2020 Total lbs lost to date: 40 Total lbs lost since last in-office visit: 0  Interim History: Jennifer [name] was on vacation at [employer] and she did some celebration eating, and she is up a bit in weight. She is ready to get back on track. She has been drinking more diet soda.  Subjective:   1. Essential hypertension Jennifer [name]'s blood pressure is well controlled today. She is on losartan and she is working on diet and weight loss.  Assessment/Plan:   1. Essential hypertension Jennifer [name] will continue with losartan and diet to improve blood pressure control. She will watch for signs of hypotension as she continues her lifestyle modifications.  2. Obesity with current BMI 29.8 Jennifer [name] is currently in the action stage of change. As such, her goal is to continue with weight loss efforts. She has agreed to the Category 1 Plan.   Behavioral modification strategies: increasing lean protein intake.  Jennifer [name] has agreed to follow-up with our clinic in 3 to 4 weeks. She was informed of the importance of frequent follow-up visits to maximize her success with intensive lifestyle modifications for her multiple health conditions.   Objective:   Blood pressure 131/76, pulse 68, temperature 98.1 F (36.7 C), height 5\' 3"  (1.6 m), weight 168 lb (76.2 kg), SpO2 98 %. Body mass index is 29.76 kg/m.  General: Cooperative, alert, well developed, in no acute distress. HEENT: Conjunctivae and lids unremarkable. Cardiovascular: Regular rhythm.  Lungs: Normal work of  breathing. Neurologic: No focal deficits.   Lab Results  Component Value Date   CREATININE 0.75 06/19/2020   BUN 20 06/19/2020   NA 137 06/19/2020   K 4.5 06/19/2020   CL 100 06/19/2020   CO2 21 06/19/2020   Lab Results  Component Value Date   ALT 26 06/19/2020   AST 21 06/19/2020   ALKPHOS 97 06/19/2020   BILITOT 0.5 06/19/2020   Lab Results  Component Value Date   HGBA1C 5.2 01/17/2020   HGBA1C 5.4 05/05/2019   HGBA1C 5.4 12/14/2018   HGBA1C 5.4 08/25/2018   HGBA1C 5.2 09/16/2017   Lab Results  Component Value Date   INSULIN 4.7 01/17/2020   INSULIN 13.5 05/05/2019   INSULIN 12.8 12/14/2018   INSULIN 14.7 08/25/2018   Lab Results  Component Value Date   TSH 3.220 06/19/2020   Lab Results  Component Value Date   CHOL 188 06/19/2020   HDL 59 06/19/2020   LDLCALC 106 (H) 06/19/2020   LDLDIRECT 96.0 05/04/2018   TRIG 130 06/19/2020   CHOLHDL 3.5 05/05/2019   Lab Results  Component Value Date   VD25OH 48.7 06/19/2020   VD25OH 48.0 01/17/2020   VD25OH 54.5 08/30/2019   Lab Results  Component Value Date   WBC 4.0 11/01/2019   HGB 13.0 11/01/2019   HCT 37.8 11/01/2019   MCV 89.9 11/01/2019   PLT 311.0 11/01/2019   Lab Results  Component Value Date   IRON 110 11/01/2019   TIBC 382 11/01/2019   Attestation Statements:   Reviewed by clinician on day of  visit: allergies, medications, problem list, medical history, surgical history, family history, social history, and previous encounter notes.  Time spent on visit including pre-visit chart review and post-visit care and charting was 35 minutes.    I, Trixie Dredge, am acting as transcriptionist for Dennard Nip, MD.  I have reviewed the above documentation for accuracy and completeness, and I agree with the above. -  Dennard Nip, MD

## 2020-10-11 ENCOUNTER — Encounter (INDEPENDENT_AMBULATORY_CARE_PROVIDER_SITE_OTHER): Payer: Self-pay | Admitting: Family Medicine

## 2020-10-12 ENCOUNTER — Ambulatory Visit (INDEPENDENT_AMBULATORY_CARE_PROVIDER_SITE_OTHER): Payer: Medicare Other | Admitting: *Deleted

## 2020-10-12 DIAGNOSIS — Z0000 Encounter for general adult medical examination without abnormal findings: Secondary | ICD-10-CM

## 2020-10-12 NOTE — Patient Instructions (Signed)
Jennifer [name] , Thank you for taking time to come for your Medicare Wellness Visit. I appreciate your ongoing commitment to your health goals. Please review the following plan we discussed and let me know if I can assist you in the future.   Screening recommendations/referrals: Colonoscopy: up to date Mammogram: up to date Bone Density: up to date Recommended yearly ophthalmology/optometry visit for glaucoma screening and checkup Recommended yearly dental visit for hygiene and checkup  Vaccinations: Influenza vaccine: up to date Pneumococcal vaccine: up to date Tdap vaccine: up to date Shingles vaccine: up to date    Advanced directives: Education provided  Conditions/risks identified:     Preventive Care 69 Years and Older, Female Preventive care refers to lifestyle choices and visits with your health care provider that can promote health and wellness. What does preventive care include? A yearly physical exam. This is also called an annual well check. Dental exams once or twice a year. Routine eye exams. Ask your health care provider how often you should have your eyes checked. Personal lifestyle choices, including: Daily care of your teeth and gums. Regular physical activity. Eating a healthy diet. Avoiding tobacco and drug use. Limiting alcohol use. Practicing safe sex. Taking low-dose aspirin every day. Taking vitamin and mineral supplements as recommended by your health care provider. What happens during an annual well check? The services and screenings done by your health care provider during your annual well check will depend on your age, overall health, lifestyle risk factors, and family history of disease. Counseling  Your health care provider may ask you questions about your: Alcohol use. Tobacco use. Drug use. Emotional well-being. Home and relationship well-being. Sexual activity. Eating habits. History of falls. Memory and ability to understand  (cognition). Work and work Statistician. Reproductive health. Screening  You may have the following tests or measurements: Height, weight, and BMI. Blood pressure. Lipid and cholesterol levels. These may be checked every 5 years, or more frequently if you are over 69 years old. Skin check. Lung cancer screening. You may have this screening every year starting at age 69 if you have a 30-pack-year history of smoking and currently smoke or have quit within the past 15 years. Fecal occult blood test (FOBT) of the stool. You may have this test every year starting at age 69. Flexible sigmoidoscopy or colonoscopy. You may have a sigmoidoscopy every 5 years or a colonoscopy every 10 years starting at age 69. Hepatitis C blood test. Hepatitis B blood test. Sexually transmitted disease (STD) testing. Diabetes screening. This is done by checking your blood sugar (glucose) after you have not eaten for a while (fasting). You may have this done every 1-3 years. Bone density scan. This is done to screen for osteoporosis. You may have this done starting at age 69. Mammogram. This may be done every 1-2 years. Talk to your health care provider about how often you should have regular mammograms. Talk with your health care provider about your test results, treatment options, and if necessary, the need for more tests. Vaccines  Your health care provider may recommend certain vaccines, such as: Influenza vaccine. This is recommended every year. Tetanus, diphtheria, and acellular pertussis (Tdap, Td) vaccine. You may need a Td booster every 10 years. Zoster vaccine. You may need this after age 69. Pneumococcal 13-valent conjugate (PCV13) vaccine. One dose is recommended after age 69. Pneumococcal polysaccharide (PPSV23) vaccine. One dose is recommended after age 65. Talk to your health care provider about which screenings and vaccines  you need and how often you need them. This information is not intended to  replace advice given to you by your health care provider. Make sure you discuss any questions you have with your health care provider. Document Released: 01/20/2015 Document Revised: 09/13/2015 Document Reviewed: 10/25/2014 Elsevier Interactive Patient Education  2017 [employer] Prevention in the Home Falls can cause injuries. They can happen to people of all ages. There are many things you can do to make your home safe and to help prevent falls. What can I do on the outside of my home? Regularly fix the edges of walkways and driveways and fix any cracks. Remove anything that might make you trip as you walk through a door, such as a raised step or threshold. Trim any bushes or trees on the path to your home. Use bright outdoor lighting. Clear any walking paths of anything that might make someone trip, such as rocks or tools. Regularly check to see if handrails are loose or broken. Make sure that both sides of any steps have handrails. Any raised decks and porches should have guardrails on the edges. Have any leaves, snow, or ice cleared regularly. Use sand or salt on walking paths during winter. Clean up any spills in your garage right away. This includes oil or grease spills. What can I do in the bathroom? Use night lights. Install grab bars by the toilet and in the tub and shower. Do not use towel bars as grab bars. Use non-skid mats or decals in the tub or shower. If you need to sit down in the shower, use a plastic, non-slip stool. Keep the floor dry. Clean up any water that spills on the floor as soon as it happens. Remove soap buildup in the tub or shower regularly. Attach bath mats securely with double-sided non-slip rug tape. Do not have throw rugs and other things on the floor that can make you trip. What can I do in the bedroom? Use night lights. Make sure that you have a light by your bed that is easy to reach. Do not use any sheets or blankets that are too big for  your bed. They should not hang down onto the floor. Have a firm chair that has side arms. You can use this for support while you get dressed. Do not have throw rugs and other things on the floor that can make you trip. What can I do in the kitchen? Clean up any spills right away. Avoid walking on wet floors. Keep items that you use a lot in easy-to-reach places. If you need to reach something above you, use a strong step stool that has a grab bar. Keep electrical cords out of the way. Do not use floor polish or wax that makes floors slippery. If you must use wax, use non-skid floor wax. Do not have throw rugs and other things on the floor that can make you trip. What can I do with my stairs? Do not leave any items on the stairs. Make sure that there are handrails on both sides of the stairs and use them. Fix handrails that are broken or loose. Make sure that handrails are as long as the stairways. Check any carpeting to make sure that it is firmly attached to the stairs. Fix any carpet that is loose or worn. Avoid having throw rugs at the top or bottom of the stairs. If you do have throw rugs, attach them to the floor with carpet tape. Make sure  that you have a light switch at the top of the stairs and the bottom of the stairs. If you do not have them, ask someone to add them for you. What else can I do to help prevent falls? Wear shoes that: Do not have high heels. Have rubber bottoms. Are comfortable and fit you well. Are closed at the toe. Do not wear sandals. If you use a stepladder: Make sure that it is fully opened. Do not climb a closed stepladder. Make sure that both sides of the stepladder are locked into place. Ask someone to hold it for you, if possible. Clearly mark and make sure that you can see: Any grab bars or handrails. First and last steps. Where the edge of each step is. Use tools that help you move around (mobility aids) if they are needed. These  include: Canes. Walkers. Scooters. Crutches. Turn on the lights when you go into a dark area. Replace any light bulbs as soon as they burn out. Set up your furniture so you have a clear path. Avoid moving your furniture around. If any of your floors are uneven, fix them. If there are any pets around you, be aware of where they are. Review your medicines with your doctor. Some medicines can make you feel dizzy. This can increase your chance of falling. Ask your doctor what other things that you can do to help prevent falls. This information is not intended to replace advice given to you by your health care provider. Make sure you discuss any questions you have with your health care provider. Document Released: 10/20/2008 Document Revised: 06/01/2015 Document Reviewed: 01/28/2014 Elsevier Interactive Patient Education  2017 [employer].

## 2020-10-12 NOTE — Progress Notes (Signed)
Subjective:   Jennifer [name] is a 70 y.o. female who presents for Medicare Annual (Subsequent) preventive examination.  I connected with  Jennifer [name] on 10/12/20 by a  telephone enabled telemedicine application and verified that I am speaking with the correct person using two identifiers.   I discussed the limitations of evaluation and management by telemedicine. The patient expressed understanding and agreed to proceed.    Review of Systems     Cardiac Risk Factors include: hypertension;advanced age (>41men, >81 women);obesity (BMI >30kg/m2)     Objective:    Today's Vitals   There is no height or weight on file to calculate BMI.  Advanced Directives 10/12/2020 09/05/2020 08/24/2019 04/19/2019 07/20/2018 07/20/2018 07/16/2018  Does Patient Have a Medical Advance Directive? No Yes Yes Yes No No No  Type of Advance Directive - [place];Out of facility DNR (pink MOST or yellow form) [place];Living will;Out of facility DNR (pink MOST or yellow form) [place];Living will - - -  Copy of [place] in Chart? - - - Yes - validated most recent copy scanned in chart (See row information) - - -  Would patient like information on creating a medical advance directive? No - Patient declined - - - No - Patient declined - No - Patient declined    Current Medications (verified) Outpatient Encounter Medications as of 10/12/2020  Medication Sig   Biotin 1000 MCG tablet Take 1,000 mcg by mouth daily.   CALCIUM CARBONATE PO Take 600 mg by mouth daily.    cetirizine (ZYRTEC) 10 MG tablet Take 10 mg by mouth every evening.    Cholecalciferol (VITAMIN D) 50 MCG (2000 UT) tablet Take 2,000 Units by mouth daily.   citalopram (CELEXA) 40 MG tablet Take 1 tablet (40 mg total) by mouth daily.   hydroxypropyl methylcellulose / hypromellose (ISOPTO TEARS / GONIOVISC) 2.5 % ophthalmic solution Place 1 drop into both eyes 2  (two) times daily as needed for dry eyes.   ketoconazole (NIZORAL) 2 % cream Apply 1 application topically daily.   levETIRAcetam (KEPPRA) 500 MG tablet Take 1 tablet (500 mg total) by mouth 2 (two) times daily.   levothyroxine (SYNTHROID) 50 MCG tablet TAKE 1 TABLET BY MOUTH EVERY DAY   losartan (COZAAR) 25 MG tablet Take 2 tablets (50 mg total) by mouth daily.   Multiple Vitamins-Minerals (MULTIPLE VITAMINS/WOMENS PO) Take 1 tablet by mouth daily.    pravastatin (PRAVACHOL) 80 MG tablet TAKE 1 TABLET BY MOUTH EVERY DAY   triamcinolone cream (KENALOG) 0.1 % Apply 1 application topically 2 (two) times daily.   valACYclovir (VALTREX) 1000 MG tablet Take 1 tablet (1,000 mg total) by mouth 2 (two) times daily.   No facility-administered encounter medications on file as of 10/12/2020.    Allergies (verified) Crestor [rosuvastatin], Neomycin-bacitracin zn-polymyx, Neomycin-bacitracin-polymyxin [bacitracin-neomycin-polymyxin], Codeine, Feldene [piroxicam], Penicillins, Piroxicam, Sulfa antibiotics, and Sulfonamide derivatives   History: Past Medical History:  Diagnosis Date   Allergy    Anxiety    Constipation    Depression    Dysrhythmia    FLB - ? PAC'S COMES AND GOES   Gallbladder problem    GERD (gastroesophageal reflux disease)    High cholesterol    Hx of adenomatous polyp of colon    2012 - [place]   Hyperlipidemia    Hypertension    Hypothyroid    Joint pain    no current problems   Osteoarthritis  PLMD (periodic limb movement disorder) 04/24/2012   Prediabetes    Seizures ([place]) 08/29/2013   only 1 seizure episode   Sleep apnea    wears CPAP nightly   Vitamin D deficiency    Past Surgical History:  Procedure Laterality Date   APPENDECTOMY     CESAREAN SECTION      x1   CHOLECYSTECTOMY  1978   COLONOSCOPY     DILATION AND CURETTAGE OF UTERUS     JOINT REPLACEMENT  1998   right knee   OSTEOTOMY PROXIMAL FEMORAL     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    Procedure: TOTAL HIP ARTHROPLASTY ANTERIOR APPROACH;  Surgeon: Frederik Pear, MD;  Location: [facility];  Service: Orthopedics;  Laterality: Right;   TOTAL KNEE ARTHROPLASTY Left 07/20/2018   Procedure: Left Knee Arthroplasty;  Surgeon: Frederik Pear, MD;  Location: [facility];  Service: Orthopedics;  Laterality: Left;   Family History  Problem Relation Age of Onset   Cancer Father 36   Esophageal cancer Father    Alcoholism Father    Heart disease Mother 48       Aortic valve disease   Diabetes Mother    Hypertension Mother    Thyroid disease Mother    Atrial fibrillation Brother    Sleep apnea Brother    Sleep apnea Sister    Sleep apnea Sister    Stomach cancer Maternal Grandmother    Colon cancer Neg Hx    Rectal cancer Neg Hx    Social History   Socioeconomic History   Marital status: Divorced    Spouse name: Not on file   Number of children: 1   Years of education: Not on file   Highest education level: Not on file  Occupational History   Occupation: Retired    Fish farm manager: [employer]  Tobacco Use   Smoking status: Never   Smokeless tobacco: Never  Vaping Use   Vaping Use: Never used  Substance and Sexual Activity   Alcohol use: No    Alcohol/week: 0.0 standard drinks   Drug use: No   Sexual activity: Never    Birth control/protection: Post-menopausal  Other Topics Concern   Not on file  Social History Narrative   Lives with blind dog and cat.    Right handed    Social Determinants of Health   Financial Resource Strain: Low Risk    Difficulty of Paying Living Expenses: Not hard at all  Food Insecurity: No Food Insecurity   Worried About Charity fundraiser in the Last Year: Never true   Ran Out of Food in the Last Year: Never true  Transportation Needs: No Transportation Needs   Lack of Transportation (Medical): No   Lack of Transportation (Non-Medical): No  Physical Activity: Insufficiently Active   Days of Exercise per Week: 4 days   Minutes of Exercise per  Session: 30 min  Stress: No Stress Concern Present   Feeling of Stress : Not at all  Social Connections: Moderately Integrated   Frequency of Communication with Friends and Family: Three times a week   Frequency of Social Gatherings with Friends and Family: More than three times a week   Attends Religious Services: More than 4 times per year   Active Member of [employer] or Organizations: Yes   Attends Music therapist: More than 4 times per year   Marital Status: Divorced    Tobacco Counseling Counseling given: Not Answered   Clinical Intake:  Pre-visit preparation  completed: No  Pain : No/denies pain     Nutritional Risks: None Diabetes: No  How often do you need to have someone help you when you read instructions, pamphlets, or other written materials from your doctor or pharmacy?: 1 - Never  Diabetic?no  Interpreter Needed?: No  Information entered by :: Leroy Kennedy LPN   Activities of Daily Living In your present state of health, do you have any difficulty performing the following activities: 10/12/2020  Hearing? N  Vision? N  Difficulty concentrating or making decisions? N  Walking or climbing stairs? N  Dressing or bathing? N  Doing errands, shopping? N  Preparing Food and eating ? N  Using the Toilet? N  In the past six months, have you accidently leaked urine? N  Do you have problems with loss of bowel control? N  Managing your Medications? N  Managing your Finances? N  Housekeeping or managing your Housekeeping? N  Some recent data might be hidden    Patient Care Team: Wendie Agreste, MD as PCP - General (Family Medicine) Cameron Sprang, MD as Consulting Physician (Neurology) Star Age, MD as Attending Physician (Neurology) Starlyn Skeans, MD as Consulting Physician (Family Medicine)  Indicate any recent Medical Services you may have received from other than [facility] providers in the past year (date may be approximate).      Assessment:   This is a routine wellness examination for Neville.  Hearing/Vision screen Hearing Screening - Comments:: No trouble hearing  Vision Screening - Comments:: Up to Date [facility]   Jule Ser  Dietary issues and exercise activities discussed: Current Exercise Habits: Home exercise routine, Type of exercise: walking, Time (Minutes): 30, Frequency (Times/Week): 4, Weekly Exercise (Minutes/Week): 120, Intensity: Mild   Goals Addressed             This Visit's Progress    Patient Stated   Not on track    04/19/2019, I will continue to do my exercise tapes 4-5 days a week for about 1-2 miles at a time.      Patient Stated       Would like to get back on track with weight loss and exercise        Depression Screen PHQ 2/9 Scores 10/12/2020 10/12/2020 09/06/2020 06/08/2020 11/01/2019 04/19/2019 08/25/2018  PHQ - 2 Score 1 1 0 0 0 2 3  PHQ- 9 Score 3 3 - 1 - 2 11  Some encounter information is confidential and restricted. Go to Review Flowsheets activity to see all data.    Fall Risk Fall Risk  10/12/2020 09/06/2020 09/05/2020 06/08/2020 08/24/2019  Falls in the past year? 0 0 1 0 0  Number falls in past yr: 0 0 0 - 0  Injury with Fall? 0 0 0 - 0  Risk for fall due to : - No Fall Risks - - -  Follow up Falls evaluation completed;Falls prevention discussed Falls evaluation completed - Falls evaluation completed -  Some encounter information is confidential and restricted. Go to Review Flowsheets activity to see all data.    FALL RISK PREVENTION PERTAINING TO THE HOME:  Any stairs in or around the home? No  If so, are there any without handrails? No  Home free of loose throw rugs in walkways, pet beds, electrical cords, etc? Yes  Adequate lighting in your home to reduce risk of falls? Yes   ASSISTIVE DEVICES UTILIZED TO PREVENT FALLS:  Life alert? No  Use of a cane, walker  or w/c? No  Grab bars in the bathroom? No  Shower chair or bench in shower? No  Elevated  toilet seat or a handicapped toilet? No   TIMED UP AND GO:  Was the test performed? No .    Cognitive Function:  Normal cognitive status assessed by direct observation by this Nurse Health Advisor. No abnormalities found.   MMSE - Mini Mental State Exam 04/19/2019  Orientation to time 5  Orientation to Place 5  Registration 3  Attention/ Calculation 5  Recall 3  Language- repeat 1        Immunizations Immunization History  Administered Date(s) Administered   Fluad Quad(high Dose 65+) 10/29/2019, 09/07/2020   Influenza, High Dose Seasonal PF 10/08/2016, 09/17/2017, 09/08/2018   Influenza,inj,Quad PF,6+ Mos 09/02/2015   Influenza-Unspecified 11/06/2013   Moderna Sars-Covid-2 Vaccination 02/10/2019, 03/10/2019   PFIZER(Purple Top)SARS-COV-2 Vaccination 11/01/2019, 06/03/2020   Pneumococcal Conjugate-13 03/08/2016   Pneumococcal Polysaccharide-23 12/19/2019   Tdap 06/18/2011   Zoster Recombinat (Shingrix) 07/19/2019, 11/18/2019    TDAP status: Up to date  Flu Vaccine status: Up to date  Pneumococcal vaccine status: Up to date  Covid-19 vaccine status: Information provided on how to obtain vaccines.   Qualifies for Shingles Vaccine? No   Zostavax completed Yes   Shingrix Completed?: Yes  Screening Tests Health Maintenance  Topic Date Due   COVID-19 Vaccine (5 - Booster) 10/04/2020   TETANUS/TDAP  06/17/2021   COLONOSCOPY (Pts 45-19yrs Insurance coverage will need to be confirmed)  04/29/2022   MAMMOGRAM  08/30/2022   INFLUENZA VACCINE  Completed   DEXA SCAN  Completed   Hepatitis C Screening  Completed   Zoster Vaccines- Shingrix  Completed   HPV VACCINES  Aged Out    Health Maintenance  Health Maintenance Due  Topic Date Due   COVID-19 Vaccine (5 - Booster) 10/04/2020    Colorectal cancer screening: Type of screening: Colonoscopy. Completed 2021. Repeat every 5 years  Mammogram status: Completed  . Repeat every year  Bone Density status: Completed   . Results reflect: Bone density results: OSTEOPOROSIS. Repeat every 2 years.  Lung Cancer Screening: (Low Dose CT Chest recommended if Age 12-80 years, 30 pack-year currently smoking OR have quit w/in 15years.) does not qualify.   Lung Cancer Screening Referral:   Additional Screening:  Hepatitis C Screening: does not qualify; Completed   Vision Screening: Recommended annual ophthalmology exams for early detection of glaucoma and other disorders of the eye. Is the patient up to date with their annual eye exam?  Yes  Who is the provider or what is the name of the office in which the patient attends annual eye exams? [facility]  If pt is not established with a provider, would they like to be referred to a provider to establish care? No .   Dental Screening: Recommended annual dental exams for proper oral hygiene  Community Resource Referral / Chronic Care Management: CRR required this visit?  No   CCM required this visit?  No      Plan:     I have personally reviewed and noted the following in the patient's chart:   Medical and social history Use of alcohol, tobacco or illicit drugs  Current medications and supplements including opioid prescriptions.  Functional ability and status Nutritional status Physical activity Advanced directives List of other physicians Hospitalizations, surgeries, and ER visits in previous 12 months Vitals Screenings to include cognitive, depression, and falls Referrals and appointments  In addition, I have reviewed  and discussed with patient certain preventive protocols, quality metrics, and best practice recommendations. A written personalized care plan for preventive services as well as general preventive health recommendations were provided to patient.     Leroy Kennedy, LPN   53/02/231   Nurse Notes:

## 2020-10-31 ENCOUNTER — Encounter (INDEPENDENT_AMBULATORY_CARE_PROVIDER_SITE_OTHER): Payer: Self-pay | Admitting: Family Medicine

## 2020-10-31 ENCOUNTER — Other Ambulatory Visit: Payer: Self-pay

## 2020-10-31 ENCOUNTER — Ambulatory Visit (INDEPENDENT_AMBULATORY_CARE_PROVIDER_SITE_OTHER): Payer: Medicare Other | Admitting: Family Medicine

## 2020-10-31 VITALS — BP 122/75 | HR 58 | Temp 97.9°F | Ht 63.0 in | Wt 168.0 lb

## 2020-10-31 DIAGNOSIS — E7849 Other hyperlipidemia: Secondary | ICD-10-CM | POA: Diagnosis not present

## 2020-10-31 DIAGNOSIS — Z6836 Body mass index (BMI) 36.0-36.9, adult: Secondary | ICD-10-CM

## 2020-10-31 NOTE — Progress Notes (Signed)
Chief Complaint:   OBESITY Zelene is here to discuss her progress with her obesity treatment plan along with follow-up of her obesity related diagnoses. Carley is on the Category 1 Plan and states she is following her eating plan approximately 80% of the time. Delphine states she is doing 0 minutes 0 times per week.  Today's visit was #: 28 Starting weight: 208 lbs Starting date: 08/25/2018 Today's weight: 168 lbs Today's date: 10/31/2020 Total lbs lost to date: 40 Total lbs lost since last in-office visit: 0  Interim History: Merry has done well maintaining her weight. She is struggling to get back to exercising.  Subjective:   1. Hyperlipidemia, pure Liberti's LDL is improving and almost at goal on Pravachol, and with diet and weight loss.  Assessment/Plan:   1. Hyperlipidemia, pure Cardiovascular risk and specific lipid/LDL goals reviewed. We discussed several lifestyle modifications today. Kira will continue to work on diet, exercise and weight loss efforts. We will recheck labs in 6 weeks. Orders and follow up as documented in patient record.   2. Obesity with current BMI 29.9 Markea is currently in the action stage of change. As such, her goal is to continue with weight loss efforts. She has agreed to the Category 1 Plan.   We discussed motivation strategies to exercise.  Exercise goals: All adults should avoid inactivity. Some physical activity is better than none, and adults who participate in any amount of physical activity gain some health benefits.  Behavioral modification strategies: better snacking choices.  Angely has agreed to follow-up with our clinic in 3 weeks. She was informed of the importance of frequent follow-up visits to maximize her success with intensive lifestyle modifications for her multiple health conditions.   Objective:   Blood pressure 122/75, pulse (!) 58, temperature 97.9 F (36.6 C), height 5\' 3"  (1.6 m), weight 168 lb (76.2  kg), SpO2 96 %. Body mass index is 29.76 kg/m.  General: Cooperative, alert, well developed, in no acute distress. HEENT: Conjunctivae and lids unremarkable. Cardiovascular: Regular rhythm.  Lungs: Normal work of breathing. Neurologic: No focal deficits.   Lab Results  Component Value Date   CREATININE 0.75 06/19/2020   BUN 20 06/19/2020   NA 137 06/19/2020   K 4.5 06/19/2020   CL 100 06/19/2020   CO2 21 06/19/2020   Lab Results  Component Value Date   ALT 26 06/19/2020   AST 21 06/19/2020   ALKPHOS 97 06/19/2020   BILITOT 0.5 06/19/2020   Lab Results  Component Value Date   HGBA1C 5.2 01/17/2020   HGBA1C 5.4 05/05/2019   HGBA1C 5.4 12/14/2018   HGBA1C 5.4 08/25/2018   HGBA1C 5.2 09/16/2017   Lab Results  Component Value Date   INSULIN 4.7 01/17/2020   INSULIN 13.5 05/05/2019   INSULIN 12.8 12/14/2018   INSULIN 14.7 08/25/2018   Lab Results  Component Value Date   TSH 3.220 06/19/2020   Lab Results  Component Value Date   CHOL 188 06/19/2020   HDL 59 06/19/2020   LDLCALC 106 (H) 06/19/2020   LDLDIRECT 96.0 05/04/2018   TRIG 130 06/19/2020   CHOLHDL 3.5 05/05/2019   Lab Results  Component Value Date   VD25OH 48.7 06/19/2020   VD25OH 48.0 01/17/2020   VD25OH 54.5 08/30/2019   Lab Results  Component Value Date   WBC 4.0 11/01/2019   HGB 13.0 11/01/2019   HCT 37.8 11/01/2019   MCV 89.9 11/01/2019   PLT 311.0 11/01/2019   Lab Results  Component Value Date   IRON 110 11/01/2019   TIBC 382 11/01/2019   Attestation Statements:   Reviewed by clinician on day of visit: allergies, medications, problem list, medical history, surgical history, family history, social history, and previous encounter notes.  Time spent on visit including pre-visit chart review and post-visit care and charting was 34 minutes.    I, Trixie Dredge, am acting as transcriptionist for Dennard Nip, MD.  I have reviewed the above documentation for accuracy and completeness,  and I agree with the above. -  Dennard Nip, MD

## 2020-11-16 ENCOUNTER — Ambulatory Visit (INDEPENDENT_AMBULATORY_CARE_PROVIDER_SITE_OTHER): Payer: Medicare Other | Admitting: Neurology

## 2020-11-16 ENCOUNTER — Encounter: Payer: Self-pay | Admitting: Neurology

## 2020-11-16 VITALS — BP 130/67 | HR 58 | Ht 63.0 in | Wt 178.6 lb

## 2020-11-16 DIAGNOSIS — G4733 Obstructive sleep apnea (adult) (pediatric): Secondary | ICD-10-CM

## 2020-11-16 DIAGNOSIS — Z9989 Dependence on other enabling machines and devices: Secondary | ICD-10-CM | POA: Diagnosis not present

## 2020-11-16 NOTE — Patient Instructions (Signed)
It was nice to see you again today.  I am so sorry for the loss of your brother-in-law, it sounds like he was a gem of person.  Your compliance looks excellent, your apnea score is at goal, just to make a little higher, try the F30 fullface mask again, I have written for new supplies.  Please continue using your CPAP regularly. While your insurance requires that you use CPAP at least 4 hours each night on 70% of the nights, I recommend, that you not skip any nights and use it throughout the night if you can. Getting used to CPAP and staying with the treatment long term does take time and patience and discipline. Untreated obstructive sleep apnea when it is moderate to severe can have an adverse impact on cardiovascular health and raise her risk for heart disease, arrhythmias, hypertension, congestive heart failure, stroke and diabetes. Untreated obstructive sleep apnea causes sleep disruption, nonrestorative sleep, and sleep deprivation. This can have an impact on your day to day functioning and cause daytime sleepiness and impairment of cognitive function, memory loss, mood disturbance, and problems focussing. Using CPAP regularly can improve these symptoms.  Please follow-up in 1 year routinely.  Have a great holiday season!
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
## 2020-11-23 ENCOUNTER — Encounter (INDEPENDENT_AMBULATORY_CARE_PROVIDER_SITE_OTHER): Payer: Self-pay | Admitting: Family Medicine

## 2020-11-23 ENCOUNTER — Ambulatory Visit (INDEPENDENT_AMBULATORY_CARE_PROVIDER_SITE_OTHER): Payer: Medicare Other | Admitting: Family Medicine

## 2020-11-23 ENCOUNTER — Other Ambulatory Visit: Payer: Self-pay

## 2020-11-23 VITALS — BP 144/75 | HR 72 | Temp 98.0°F | Ht 63.0 in | Wt 171.0 lb

## 2020-11-23 DIAGNOSIS — I10 Essential (primary) hypertension: Secondary | ICD-10-CM

## 2020-11-23 DIAGNOSIS — Z6836 Body mass index (BMI) 36.0-36.9, adult: Secondary | ICD-10-CM

## 2020-11-23 NOTE — Progress Notes (Signed)
Chief Complaint:   OBESITY Jennifer [name] is here to discuss her progress with her obesity treatment plan along with follow-up of her obesity related diagnoses. Jennifer [name] is on the Category 1 Plan and states she is following her eating plan approximately 70% of the time. Jennifer [name] states she is doing 0 minutes 0 times per week.  Today's visit was #: 13 Starting weight: 208 lbs Starting date: 08/25/2018 Today's weight: 171 lbs Today's date: 11/23/2020 Total lbs lost to date: 37 Total lbs lost since last in-office visit: 3  Interim History: Jennifer [name] continues to do well with weight loss even over Halloween. She did well with avoiding holiday and traveling weight gain.  Subjective:   1. Essential hypertension Jennifer [name]'s blood pressure is on losartan. No chest pain or side effects were noted. She continues to work on weight loss and decreasing Na+.  Assessment/Plan:   1. Essential hypertension Jennifer [name] will continue with diet, exercise, and her medications; and will continue to monitor her blood pressure as she continues her lifestyle modifications.  2. Obesity BMI today is 58 Jennifer [name] is currently in the action stage of change. As such, her goal is to continue with weight loss efforts. She has agreed to the Category 1 Plan.   Behavioral modification strategies: holiday eating strategies .  Jennifer [name] has agreed to follow-up with our clinic in 4 weeks. She was informed of the importance of frequent follow-up visits to maximize her success with intensive lifestyle modifications for her multiple health conditions.   Objective:   Blood pressure (!) 144/75, pulse 72, temperature 98 F (36.7 C), height 5\' 3"  (1.6 m), weight 171 lb (77.6 kg), SpO2 90 %. Body mass index is 30.29 kg/m.  General: Cooperative, alert, well developed, in no acute distress. HEENT: Conjunctivae and lids unremarkable. Cardiovascular: Regular rhythm.  Lungs: Normal work of breathing. Neurologic: No focal deficits.    Lab Results  Component Value Date   CREATININE 0.75 06/19/2020   BUN 20 06/19/2020   NA 137 06/19/2020   K 4.5 06/19/2020   CL 100 06/19/2020   CO2 21 06/19/2020   Lab Results  Component Value Date   ALT 26 06/19/2020   AST 21 06/19/2020   ALKPHOS 97 06/19/2020   BILITOT 0.5 06/19/2020   Lab Results  Component Value Date   HGBA1C 5.2 01/17/2020   HGBA1C 5.4 05/05/2019   HGBA1C 5.4 12/14/2018   HGBA1C 5.4 08/25/2018   HGBA1C 5.2 09/16/2017   Lab Results  Component Value Date   INSULIN 4.7 01/17/2020   INSULIN 13.5 05/05/2019   INSULIN 12.8 12/14/2018   INSULIN 14.7 08/25/2018   Lab Results  Component Value Date   TSH 3.220 06/19/2020   Lab Results  Component Value Date   CHOL 188 06/19/2020   HDL 59 06/19/2020   LDLCALC 106 (H) 06/19/2020   LDLDIRECT 96.0 05/04/2018   TRIG 130 06/19/2020   CHOLHDL 3.5 05/05/2019   Lab Results  Component Value Date   VD25OH 48.7 06/19/2020   VD25OH 48.0 01/17/2020   VD25OH 54.5 08/30/2019   Lab Results  Component Value Date   WBC 4.0 11/01/2019   HGB 13.0 11/01/2019   HCT 37.8 11/01/2019   MCV 89.9 11/01/2019   PLT 311.0 11/01/2019   Lab Results  Component Value Date   IRON 110 11/01/2019   TIBC 382 11/01/2019   Attestation Statements:   Reviewed by clinician on day of visit: allergies, medications, problem list, medical history, surgical history, family history, social history, and  previous encounter notes.  Time spent on visit including pre-visit chart review and post-visit care and charting was 32 minutes.    I, Trixie Dredge, am acting as transcriptionist for Dennard Nip, MD.  I have reviewed the above documentation for accuracy and completeness, and I agree with the above. -  Dennard Nip, MD

## 2020-12-05 ENCOUNTER — Other Ambulatory Visit: Payer: Self-pay | Admitting: Family Medicine

## 2020-12-05 DIAGNOSIS — F329 Major depressive disorder, single episode, unspecified: Secondary | ICD-10-CM

## 2020-12-05 DIAGNOSIS — I10 Essential (primary) hypertension: Secondary | ICD-10-CM

## 2020-12-05 DIAGNOSIS — E119 Type 2 diabetes mellitus without complications: Secondary | ICD-10-CM

## 2020-12-12 ENCOUNTER — Encounter: Payer: Self-pay | Admitting: Neurology

## 2020-12-12 NOTE — Progress Notes (Signed)
Jennifer Amass, RN got it!      Previous Messages   ----- Message -----  From: Brandon Melnick, RN  Sent: 12/12/2020   2:07 PM EST  To: Ocie Bob, *  Subject: cpap                                           New order for pt in [place] "Debbie"  Female, 70 y.o., 04-03-1950  MRN:  [id number]    Rx:  Please renew CPAP supplies: New mask (size to fit), hose, filters and related supplies such as chin strap as needed and, if needed, and new humidification unit.   SY

## 2020-12-14 ENCOUNTER — Encounter (INDEPENDENT_AMBULATORY_CARE_PROVIDER_SITE_OTHER): Payer: Self-pay

## 2020-12-14 ENCOUNTER — Ambulatory Visit (INDEPENDENT_AMBULATORY_CARE_PROVIDER_SITE_OTHER): Payer: Medicare Other | Admitting: Family Medicine

## 2020-12-15 ENCOUNTER — Encounter (INDEPENDENT_AMBULATORY_CARE_PROVIDER_SITE_OTHER): Payer: Self-pay

## 2020-12-18 ENCOUNTER — Ambulatory Visit (INDEPENDENT_AMBULATORY_CARE_PROVIDER_SITE_OTHER): Payer: Medicare Other | Admitting: Family Medicine

## 2020-12-21 ENCOUNTER — Encounter: Payer: Self-pay | Admitting: Family Medicine

## 2020-12-21 DIAGNOSIS — E782 Mixed hyperlipidemia: Secondary | ICD-10-CM

## 2020-12-21 DIAGNOSIS — E119 Type 2 diabetes mellitus without complications: Secondary | ICD-10-CM

## 2020-12-21 MED ORDER — PRAVASTATIN SODIUM 80 MG PO TABS: 80.0000 mg | ORAL_TABLET | Freq: Every day | ORAL | 3 refills | Status: DC

## 2020-12-25 ENCOUNTER — Other Ambulatory Visit: Payer: Self-pay

## 2020-12-25 ENCOUNTER — Encounter (INDEPENDENT_AMBULATORY_CARE_PROVIDER_SITE_OTHER): Payer: Self-pay | Admitting: Family Medicine

## 2020-12-25 ENCOUNTER — Ambulatory Visit (INDEPENDENT_AMBULATORY_CARE_PROVIDER_SITE_OTHER): Payer: Medicare Other | Admitting: Family Medicine

## 2020-12-25 VITALS — BP 131/70 | HR 78 | Temp 98.3°F | Ht 63.0 in | Wt 169.0 lb

## 2020-12-25 DIAGNOSIS — E559 Vitamin D deficiency, unspecified: Secondary | ICD-10-CM

## 2020-12-25 DIAGNOSIS — R7303 Prediabetes: Secondary | ICD-10-CM | POA: Diagnosis not present

## 2020-12-25 DIAGNOSIS — Z6836 Body mass index (BMI) 36.0-36.9, adult: Secondary | ICD-10-CM

## 2020-12-25 DIAGNOSIS — E782 Mixed hyperlipidemia: Secondary | ICD-10-CM | POA: Diagnosis not present

## 2020-12-25 DIAGNOSIS — D508 Other iron deficiency anemias: Secondary | ICD-10-CM | POA: Diagnosis not present

## 2020-12-25 DIAGNOSIS — I10 Essential (primary) hypertension: Secondary | ICD-10-CM | POA: Diagnosis not present

## 2020-12-25 DIAGNOSIS — E039 Hypothyroidism, unspecified: Secondary | ICD-10-CM | POA: Diagnosis not present

## 2020-12-26 LAB — T3: T3, Total: 67 ng/dL — ABNORMAL LOW (ref 71–180)

## 2020-12-26 LAB — CBC WITH DIFFERENTIAL/PLATELET
Basophils Absolute: 0 10*3/uL (ref 0.0–0.2)
Basos: 1 %
EOS (ABSOLUTE): 0.1 10*3/uL (ref 0.0–0.4)
Eos: 2 %
Hematocrit: 41 % (ref 34.0–46.6)
Hemoglobin: 14 g/dL (ref 11.1–15.9)
Immature Grans (Abs): 0 10*3/uL (ref 0.0–0.1)
Immature Granulocytes: 0 %
Lymphocytes Absolute: 0.8 10*3/uL (ref 0.7–3.1)
Lymphs: 20 %
MCH: 31 pg (ref 26.6–33.0)
MCHC: 34.1 g/dL (ref 31.5–35.7)
MCV: 91 fL (ref 79–97)
Monocytes Absolute: 0.5 10*3/uL (ref 0.1–0.9)
Monocytes: 11 %
Neutrophils Absolute: 2.9 10*3/uL (ref 1.4–7.0)
Neutrophils: 66 %
Platelets: 355 10*3/uL (ref 150–450)
RBC: 4.52 x10E6/uL (ref 3.77–5.28)
RDW: 11.7 % (ref 11.7–15.4)
WBC: 4.3 10*3/uL (ref 3.4–10.8)

## 2020-12-26 LAB — TSH: TSH: 1.93 u[IU]/mL (ref 0.450–4.500)

## 2020-12-26 LAB — COMPREHENSIVE METABOLIC PANEL
ALT: 22 IU/L (ref 0–32)
AST: 25 IU/L (ref 0–40)
Albumin/Globulin Ratio: 1.8 (ref 1.2–2.2)
Albumin: 4.8 g/dL (ref 3.8–4.8)
Alkaline Phosphatase: 105 IU/L (ref 44–121)
BUN/Creatinine Ratio: 30 — ABNORMAL HIGH (ref 12–28)
BUN: 21 mg/dL (ref 8–27)
Bilirubin Total: 0.6 mg/dL (ref 0.0–1.2)
CO2: 26 mmol/L (ref 20–29)
Calcium: 10.2 mg/dL (ref 8.7–10.3)
Chloride: 99 mmol/L (ref 96–106)
Creatinine, Ser: 0.71 mg/dL (ref 0.57–1.00)
Globulin, Total: 2.6 g/dL (ref 1.5–4.5)
Glucose: 78 mg/dL (ref 70–99)
Potassium: 5.3 mmol/L — ABNORMAL HIGH (ref 3.5–5.2)
Sodium: 141 mmol/L (ref 134–144)
Total Protein: 7.4 g/dL (ref 6.0–8.5)
eGFR: 92 mL/min/{1.73_m2} (ref >59)

## 2020-12-26 LAB — LIPID PANEL
Chol/HDL Ratio: 3.8 ratio (ref 0.0–4.4)
Cholesterol, Total: 229 mg/dL — ABNORMAL HIGH (ref 100–199)
HDL: 60 mg/dL (ref >39)
LDL Chol Calc (NIH): 142 mg/dL — ABNORMAL HIGH (ref 0–99)
Triglycerides: 154 mg/dL — ABNORMAL HIGH (ref 0–149)
VLDL Cholesterol Cal: 27 mg/dL (ref 5–40)

## 2020-12-26 LAB — INSULIN, RANDOM: INSULIN: 6.8 u[IU]/mL (ref 2.6–24.9)

## 2020-12-26 LAB — VITAMIN D 25 HYDROXY (VIT D DEFICIENCY, FRACTURES): Vit D, 25-Hydroxy: 50.6 ng/mL (ref 30.0–100.0)

## 2020-12-26 LAB — HEMOGLOBIN A1C
Est. average glucose Bld gHb Est-mCnc: 97 mg/dL
Hgb A1c MFr Bld: 5 % (ref 4.8–5.6)

## 2020-12-26 LAB — T4, FREE: Free T4: 1.1 ng/dL (ref 0.82–1.77)

## 2020-12-26 NOTE — Progress Notes (Signed)
Chief Complaint:   OBESITY Jennifer [name] is here to discuss her progress with her obesity treatment plan along with follow-up of her obesity related diagnoses. Jennifer [name] is on the Category 1 Plan and states she is following her eating plan approximately 50% of the time. Jennifer [name] states she is not currently exercising.  Today's visit was #: 58 Starting weight: 208 lbs Starting date: 08/25/2018 Today's weight: 169 lbs Today's date: 12/25/2020 Total lbs lost to date: 39 Total lbs lost since last in-office visit: 2  Interim History: Jennifer [name] is a pt of Dr. Leafy Ro. This is her first OV with me. She states she is here for fasting bloodwork. Pt has no problems or concerns with meal plan. Her hunger is controlled. [place]  Subjective:   1. Prediabetes Pt's last A1c was 5.9 on 03/2016. Per pt, A1c has never been above 6.4 and she has no prior history of diabetes mellitus. Pt is not meds.  2. Mixed hyperlipidemia Lacreshia has hyperlipidemia and has been trying to improve her cholesterol levels with intensive lifestyle modification including a low saturated fat diet, exercise and weight loss. She denies any chest pain, claudication or myalgias. Medication: Pravachol  3. Hypothyroidism, unspecified type Pt has had a low T3 in the past. She is currently asymptomatic.  4. Vitamin D deficiency She is currently taking OTC vitamin D 2,000 IU each day. She denies nausea, vomiting or muscle weakness.  5. Other iron deficiency anemia Jennifer [name]'s last CBC was over a year ago. She takes a multivitamin with iron in it.  Assessment/Plan:   Orders Placed This Encounter  Procedures   CBC with Differential/Platelet   Comprehensive metabolic panel   Insulin, random   Hemoglobin A1c   Lipid panel   TSH   T4, free   T3   VITAMIN D 25 Hydroxy (Vit-D Deficiency, Fractures)    There are no discontinued medications.   No orders of the defined types were  placed in this encounter.    1. Prediabetes [name] will continue to work on weight loss, exercise, and decreasing simple carbohydrates to help decrease the risk of diabetes. Check labs today.  - Insulin, random - Hemoglobin A1c  2. Mixed hyperlipidemia Cardiovascular risk and specific lipid/LDL goals reviewed.  We discussed several lifestyle modifications today and [name] will continue to work on diet, exercise and weight loss efforts. Orders and follow up as documented in patient record.   Counseling Intensive lifestyle modifications are the first line treatment for this issue. Dietary changes: Increase soluble fiber. Decrease simple carbohydrates. Exercise changes: Moderate to vigorous-intensity aerobic activity 150 minutes per week if tolerated. Lipid-lowering medications: see documented in medical record. Check labs today.  - Comprehensive metabolic panel - Lipid panel  3. Hypothyroidism, unspecified type Patient with long-standing hypothyroidism, on levothyroxine therapy. She appears euthyroid. Orders and follow up as documented in patient record.  Counseling Good thyroid control is important for overall health. Supratherapeutic thyroid levels are dangerous and will not improve weight loss results. The correct way to take levothyroxine is fasting, with water, separated by at least 30 minutes from breakfast, and separated by more than 4 hours from calcium, iron, multivitamins, acid reflux medications (PPIs).  Check labs today.  - TSH - T4, free - T3  4. Vitamin D deficiency Low Vitamin D level contributes to fatigue and are associated with obesity, breast, and colon cancer. She agrees to continue to take OTC Vitamin D 2,000 IU  daily and will follow-up for routine testing of Vitamin D, at least 2-3 times per year to avoid over-replacement. Check labs today.  - VITAMIN D 25 Hydroxy (Vit-D Deficiency, Fractures)  5. Other iron deficiency anemia Check labs today and continue  monitor.  - CBC with Differential/Platelet  6. Obesity with current BMI of 30.0  Tamecia is currently in the action stage of change. As such, her goal is to continue with weight loss efforts. She has agreed to the Category 1 Plan.   Exercise goals: All adults should avoid inactivity. Some physical activity is better than none, and adults who participate in any amount of physical activity gain some health benefits.  Behavioral modification strategies: holiday eating strategies . Handout given.  Edward has agreed to follow-up with our clinic in 3 weeks. She was informed of the importance of frequent follow-up visits to maximize her success with intensive lifestyle modifications for her multiple health conditions.   [name] was informed we would discuss her lab results at her next visit unless there is a critical issue that needs to be addressed sooner. [name] agreed to keep her next visit at the agreed upon time to discuss these results.  Objective:   Blood pressure 131/70, pulse 78, temperature 98.3 F (36.8 C), height 5\' 3"  (1.6 m), weight 169 lb (76.7 kg), SpO2 97 %. Body mass index is 29.94 kg/m.  General: Cooperative, alert, well developed, in no acute distress. HEENT: Conjunctivae and lids unremarkable. Cardiovascular: Regular rhythm.  Lungs: Normal work of breathing. Neurologic: No focal deficits.   Lab Results  Component Value Date   CREATININE 0.71 12/25/2020   BUN 21 12/25/2020   NA 141 12/25/2020   K 5.3 (H) 12/25/2020   CL 99 12/25/2020   CO2 26 12/25/2020   Lab Results  Component Value Date   ALT 22 12/25/2020   AST 25 12/25/2020   ALKPHOS 105 12/25/2020   BILITOT 0.6 12/25/2020   Lab Results  Component Value Date   HGBA1C 5.0 12/25/2020   HGBA1C 5.2 01/17/2020   HGBA1C 5.4 05/05/2019   HGBA1C 5.4 12/14/2018   HGBA1C 5.4 08/25/2018   Lab Results  Component Value Date   INSULIN 6.8 12/25/2020   INSULIN 4.7 01/17/2020   INSULIN 13.5 05/05/2019    INSULIN 12.8 12/14/2018   INSULIN 14.7 08/25/2018   Lab Results  Component Value Date   TSH 1.930 12/25/2020   Lab Results  Component Value Date   CHOL 229 (H) 12/25/2020   HDL 60 12/25/2020   LDLCALC 142 (H) 12/25/2020   LDLDIRECT 96.0 05/04/2018   TRIG 154 (H) 12/25/2020   CHOLHDL 3.8 12/25/2020   Lab Results  Component Value Date   VD25OH 50.6 12/25/2020   VD25OH 48.7 06/19/2020   VD25OH 48.0 01/17/2020   Lab Results  Component Value Date   WBC 4.3 12/25/2020   HGB 14.0 12/25/2020   HCT 41.0 12/25/2020   MCV 91 12/25/2020   PLT 355 12/25/2020   Lab Results  Component Value Date   IRON 110 11/01/2019   TIBC 382 11/01/2019    Obesity Behavioral Intervention:   Approximately 15 minutes were spent on the discussion below.  ASK: We discussed the diagnosis of obesity with [name] today and Kennice agreed to give [place] permission to discuss obesity behavioral modification therapy today.  ASSESS: Shakiera has the diagnosis of obesity and her BMI today is 30.0. Ilissa is in the action stage of change.   ADVISE: [place] was educated on the multiple  health risks of obesity as well as the benefit of weight loss to improve her health. She was advised of the need for long term treatment and the importance of lifestyle modifications to improve her current health and to decrease her risk of future health problems.  AGREE: Multiple dietary modification options and treatment options were discussed and Val agreed to follow the recommendations documented in the above note.  ARRANGE: [place] was educated on the importance of frequent visits to treat obesity as outlined per CMS and USPSTF guidelines and agreed to schedule her next follow up appointment today.  Attestation Statements:   Reviewed by clinician on day of visit: allergies, medications, problem list, medical history, surgical history, family history, social history, and previous encounter notes.  Coral Ceo, CMA, am acting as transcriptionist for [employer], DO.  I have reviewed the above documentation for accuracy and completeness, and I agree with the above. Marjory Sneddon, D.O.  The [place] was signed into law in 2016 which includes the topic of electronic health records.  This provides immediate access to information in MyChart.  This includes consultation notes, operative notes, office notes, lab results and pathology reports.  If you have any questions about what you read please let us know at your next visit so we can discuss your concerns and take corrective action if need be.  We are right here with you.

## 2021-01-10 ENCOUNTER — Ambulatory Visit (INDEPENDENT_AMBULATORY_CARE_PROVIDER_SITE_OTHER): Payer: Medicare Other | Admitting: Family Medicine

## 2021-01-15 ENCOUNTER — Other Ambulatory Visit: Payer: Self-pay

## 2021-01-15 ENCOUNTER — Ambulatory Visit (INDEPENDENT_AMBULATORY_CARE_PROVIDER_SITE_OTHER): Payer: Medicare Other | Admitting: Family Medicine

## 2021-01-15 ENCOUNTER — Encounter (INDEPENDENT_AMBULATORY_CARE_PROVIDER_SITE_OTHER): Payer: Self-pay | Admitting: Family Medicine

## 2021-01-15 VITALS — BP 134/76 | HR 61 | Temp 98.5°F | Ht 63.0 in | Wt 171.0 lb

## 2021-01-15 DIAGNOSIS — E7849 Other hyperlipidemia: Secondary | ICD-10-CM

## 2021-01-15 DIAGNOSIS — Z6830 Body mass index (BMI) 30.0-30.9, adult: Secondary | ICD-10-CM | POA: Diagnosis not present

## 2021-01-15 DIAGNOSIS — E875 Hyperkalemia: Secondary | ICD-10-CM

## 2021-01-16 LAB — CMP14+EGFR
ALT: 20 IU/L (ref 0–32)
AST: 21 IU/L (ref 0–40)
Albumin/Globulin Ratio: 1.9 (ref 1.2–2.2)
Albumin: 4.8 g/dL (ref 3.8–4.8)
Alkaline Phosphatase: 104 IU/L (ref 44–121)
BUN/Creatinine Ratio: 26 (ref 12–28)
BUN: 19 mg/dL (ref 8–27)
Bilirubin Total: 0.6 mg/dL (ref 0.0–1.2)
CO2: 25 mmol/L (ref 20–29)
Calcium: 9.8 mg/dL (ref 8.7–10.3)
Chloride: 101 mmol/L (ref 96–106)
Creatinine, Ser: 0.73 mg/dL (ref 0.57–1.00)
Globulin, Total: 2.5 g/dL (ref 1.5–4.5)
Glucose: 73 mg/dL (ref 70–99)
Potassium: 4.9 mmol/L (ref 3.5–5.2)
Sodium: 141 mmol/L (ref 134–144)
Total Protein: 7.3 g/dL (ref 6.0–8.5)
eGFR: 89 mL/min/{1.73_m2} (ref >59)

## 2021-01-16 NOTE — Progress Notes (Signed)
Chief Complaint:   OBESITY Jennifer [name] is here to discuss her progress with her obesity treatment plan along with follow-up of her obesity related diagnoses. Jennifer [name] is on the Category 1 Plan and states she is following her eating plan approximately 60% of the time. Jennifer [name] states she is walking daily.  Today's visit was #: 28 Starting weight: 208 lbs Starting date: 08/25/2018 Today's weight: 171 lbs Today's date: 01/15/2021 Total lbs lost to date: 37 Total lbs lost since last in-office visit: 0  Interim History: Jennifer [name] did some celebrations eating over the holidays and she worked to minimize holiday weight gain. She is ready to get back on track with her Category 1 eating plan.  Subjective:   1. Hyperkalemia Jennifer [name] had labs done recently and her K+ was elevated which is unusual for her. She denies palpitations, or muscle cramping/or weakness.  2. Hyperlipidemia, pure Jennifer [name] is working on decreasing cholesterol in her diet. No chest pain was noted. I discussed labs with the patient today.  Assessment/Plan:   1. Hyperkalemia We will check labs today, and we will follow up at Jennifer [name] next visit.  - CMP14+EGFR  2. Hyperlipidemia, pure Jennifer [name] will continue with diet and exercise, and we will continue to monitor. This may be due to genetics more than diet. Orders and follow up as documented in patient record.   3. Obesity with current BMI of 30.3 Jennifer [name] is currently in the action stage of change. As such, her goal is to continue with weight loss efforts. She has agreed to the Category 1 Plan.   Exercise goals: As is.  Behavioral modification strategies: decreasing eating out, no skipping meals, and meal planning and cooking strategies.  Jennifer [name] has agreed to follow-up with our clinic in 3 weeks. She was informed of the importance of frequent follow-up visits to maximize her success with intensive lifestyle modifications for her multiple health conditions.   Jennifer [name] was  informed we would discuss her lab results at her next visit unless there is a critical issue that needs to be addressed sooner. Jennifer [name] agreed to keep her next visit at the agreed upon time to discuss these results.  Objective:   Blood pressure 134/76, pulse 61, temperature 98.5 F (36.9 C), height '5\' 3"'  (1.6 m), weight 171 lb (77.6 kg), SpO2 100 %. Body mass index is 30.29 kg/m.  General: Cooperative, alert, well developed, in no acute distress. HEENT: Conjunctivae and lids unremarkable. Cardiovascular: Regular rhythm.  Lungs: Normal work of breathing. Neurologic: No focal deficits.   Lab Results  Component Value Date   CREATININE 0.73 01/15/2021   BUN 19 01/15/2021   NA 141 01/15/2021   K 4.9 01/15/2021   CL 101 01/15/2021   CO2 25 01/15/2021   Lab Results  Component Value Date   ALT 20 01/15/2021   AST 21 01/15/2021   ALKPHOS 104 01/15/2021   BILITOT 0.6 01/15/2021   Lab Results  Component Value Date   HGBA1C 5.0 12/25/2020   HGBA1C 5.2 01/17/2020   HGBA1C 5.4 05/05/2019   HGBA1C 5.4 12/14/2018   HGBA1C 5.4 08/25/2018   Lab Results  Component Value Date   INSULIN 6.8 12/25/2020   INSULIN 4.7 01/17/2020   INSULIN 13.5 05/05/2019   INSULIN 12.8 12/14/2018   INSULIN 14.7 08/25/2018   Lab Results  Component Value Date   TSH 1.930 12/25/2020   Lab Results  Component Value Date   CHOL 229 (H) 12/25/2020   HDL 60 12/25/2020   LDLCALC 142 (H)  12/25/2020   LDLDIRECT 96.0 05/04/2018   TRIG 154 (H) 12/25/2020   CHOLHDL 3.8 12/25/2020   Lab Results  Component Value Date   VD25OH 50.6 12/25/2020   VD25OH 48.7 06/19/2020   VD25OH 48.0 01/17/2020   Lab Results  Component Value Date   WBC 4.3 12/25/2020   HGB 14.0 12/25/2020   HCT 41.0 12/25/2020   MCV 91 12/25/2020   PLT 355 12/25/2020   Lab Results  Component Value Date   IRON 110 11/01/2019   TIBC 382 11/01/2019    Obesity Behavioral Intervention:   Approximately 15 minutes were spent on the  discussion below.  ASK: We discussed the diagnosis of obesity with Jennifer [name] today and Jennifer [name] agreed to give [place] permission to discuss obesity behavioral modification therapy today.  ASSESS: Jennifer [name] has the diagnosis of obesity and her BMI today is 30.3. Jennifer [name] is in the action stage of change.   ADVISE: Jennifer [name] was educated on the multiple health risks of obesity as well as the benefit of weight loss to improve her health. She was advised of the need for long term treatment and the importance of lifestyle modifications to improve her current health and to decrease her risk of future health problems.  AGREE: Multiple dietary modification options and treatment options were discussed and Jennifer [name] agreed to follow the recommendations documented in the above note.  ARRANGE: Jennifer [name] was educated on the importance of frequent visits to treat obesity as outlined per CMS and USPSTF guidelines and agreed to schedule her next follow up appointment today.  Attestation Statements:   Reviewed by clinician on day of visit: allergies, medications, problem list, medical history, surgical history, family history, social history, and previous encounter notes.   I, Trixie Dredge, am acting as transcriptionist for Dennard Nip, MD.  I have reviewed the above documentation for accuracy and completeness, and I agree with the above. -  Dennard Nip, MD

## 2021-02-13 ENCOUNTER — Other Ambulatory Visit: Payer: Self-pay

## 2021-02-13 ENCOUNTER — Encounter (INDEPENDENT_AMBULATORY_CARE_PROVIDER_SITE_OTHER): Payer: Self-pay | Admitting: Family Medicine

## 2021-02-13 ENCOUNTER — Ambulatory Visit (INDEPENDENT_AMBULATORY_CARE_PROVIDER_SITE_OTHER): Payer: Medicare Other | Admitting: Family Medicine

## 2021-02-13 VITALS — BP 121/65 | HR 64 | Temp 98.3°F | Ht 63.0 in | Wt 173.0 lb

## 2021-02-13 DIAGNOSIS — E669 Obesity, unspecified: Secondary | ICD-10-CM | POA: Diagnosis not present

## 2021-02-13 DIAGNOSIS — E875 Hyperkalemia: Secondary | ICD-10-CM | POA: Diagnosis not present

## 2021-02-13 DIAGNOSIS — E7849 Other hyperlipidemia: Secondary | ICD-10-CM

## 2021-02-13 DIAGNOSIS — Z6830 Body mass index (BMI) 30.0-30.9, adult: Secondary | ICD-10-CM

## 2021-02-14 NOTE — Progress Notes (Signed)
Chief Complaint:   OBESITY Jennifer [name] is here to discuss her progress with her obesity treatment plan along with follow-up of her obesity related diagnoses. Jennifer [name] is on the Category 1 Plan and states she is following her eating plan approximately 50% of the time. Jennifer [name] states she is walking steps.   Today's visit was #: 54 Starting weight: 208 lbs Starting date: 08/25/2018 Today's weight: 173 lbs Today's date: 02/13/2021 Total lbs lost to date: 35 Total lbs lost since last in-office visit: 0  Interim History: Jennifer [name] was at [employer] for 4 days and she averaged over 13,000 steps per day while there. She has gotten back on track since getting back from Jennifer [name]. She took her own snacks on vacation. She feels she is doing well on her Category 1 plan, and her goal is not to be "so hard on myself".  Subjective:   1. Other hyperlipidemia Jennifer [name] is currently taking pravastatin 80 mg. Last CMP was on 01/15/2021 and LDL was on 12/25/2020.  2. Hyperkalemia Jennifer [name]'s last CMP was on 01/15/2021, and her potassium level was 4.9.  Assessment/Plan:   1. Other hyperlipidemia Cardiovascular risk and specific lipid/LDL goals reviewed. We discussed several lifestyle modifications today. Jennifer [name] will continue to follow up with her primary care physician, and she will continue her medications as directed. She will continue to work on diet, exercise and weight loss efforts. Orders and follow up as documented in patient record.   2. Hyperkalemia We will continue to monitor, and Jennifer [name] will continue to follow up as directed.  3. Obesity with current BMI of 30.8 Jennifer [name] is currently in the action stage of change. As such, her goal is to continue with weight loss efforts. She has agreed to the Category 1 Plan.   Exercise goals: As is.  Behavioral modification strategies: increasing lean protein intake and increasing water intake.  Jennifer [name] has agreed to follow-up with our clinic in 3 to 4 weeks. She  was informed of the importance of frequent follow-up visits to maximize her success with intensive lifestyle modifications for her multiple health conditions.   Objective:   Blood pressure 121/65, pulse 64, temperature 98.3 F (36.8 C), height 5\' 3"  (1.6 m), weight 173 lb (78.5 kg), SpO2 90 %. Body mass index is 30.65 kg/m.  General: Cooperative, alert, well developed, in no acute distress. HEENT: Conjunctivae and lids unremarkable. Cardiovascular: Regular rhythm.  Lungs: Normal work of breathing. Neurologic: No focal deficits.   Lab Results  Component Value Date   CREATININE 0.73 01/15/2021   BUN 19 01/15/2021   NA 141 01/15/2021   K 4.9 01/15/2021   CL 101 01/15/2021   CO2 25 01/15/2021   Lab Results  Component Value Date   ALT 20 01/15/2021   AST 21 01/15/2021   ALKPHOS 104 01/15/2021   BILITOT 0.6 01/15/2021   Lab Results  Component Value Date   HGBA1C 5.0 12/25/2020   HGBA1C 5.2 01/17/2020   HGBA1C 5.4 05/05/2019   HGBA1C 5.4 12/14/2018   HGBA1C 5.4 08/25/2018   Lab Results  Component Value Date   INSULIN 6.8 12/25/2020   INSULIN 4.7 01/17/2020   INSULIN 13.5 05/05/2019   INSULIN 12.8 12/14/2018   INSULIN 14.7 08/25/2018   Lab Results  Component Value Date   TSH 1.930 12/25/2020   Lab Results  Component Value Date   CHOL 229 (H) 12/25/2020   HDL 60 12/25/2020   LDLCALC 142 (H) 12/25/2020   LDLDIRECT 96.0 05/04/2018   TRIG 154 (  H) 12/25/2020   CHOLHDL 3.8 12/25/2020   Lab Results  Component Value Date   VD25OH 50.6 12/25/2020   VD25OH 48.7 06/19/2020   VD25OH 48.0 01/17/2020   Lab Results  Component Value Date   WBC 4.3 12/25/2020   HGB 14.0 12/25/2020   HCT 41.0 12/25/2020   MCV 91 12/25/2020   PLT 355 12/25/2020   Lab Results  Component Value Date   IRON 110 11/01/2019   TIBC 382 11/01/2019   Attestation Statements:   Reviewed by clinician on day of visit: allergies, medications, problem list, medical history, surgical history,  family history, social history, and previous encounter notes.  Time spent on visit including pre-visit chart review and post-visit care and charting was 25 minutes.    I, Trixie Dredge, am acting as transcriptionist for Dennard Nip, MD.  I have reviewed the above documentation for accuracy and completeness, and I agree with the above. -  Dennard Nip, MD

## 2021-02-16 ENCOUNTER — Telehealth (INDEPENDENT_AMBULATORY_CARE_PROVIDER_SITE_OTHER): Payer: Medicare Other | Admitting: Family Medicine

## 2021-02-16 ENCOUNTER — Other Ambulatory Visit: Payer: Self-pay

## 2021-02-16 ENCOUNTER — Encounter: Payer: Self-pay | Admitting: Family Medicine

## 2021-02-16 VITALS — Ht 63.0 in | Wt 173.0 lb

## 2021-02-16 DIAGNOSIS — J0140 Acute pansinusitis, unspecified: Secondary | ICD-10-CM

## 2021-02-16 DIAGNOSIS — R519 Headache, unspecified: Secondary | ICD-10-CM

## 2021-02-16 MED ORDER — DOXYCYCLINE HYCLATE 100 MG PO TABS: 100.0000 mg | ORAL_TABLET | Freq: Two times a day (BID) | ORAL | 0 refills | Status: DC

## 2021-02-16 NOTE — Progress Notes (Signed)
MyChart Video Visit    Virtual Visit via Video Note   This visit type was conducted due to national recommendations for restrictions regarding the COVID-19 Pandemic (e.g. social distancing) in an effort to limit this patient's exposure and mitigate transmission in our community. This patient is at least at moderate risk for complications without adequate follow up. This format is felt to be most appropriate for this patient at this time. Physical exam was limited by quality of the video and audio technology used for the visit. CMA was able to get the patient set up on a video visit.  Patient location: Home. Patient and provider in visit Provider location: Office  I discussed the limitations of evaluation and management by telemedicine and the availability of in person appointments. The patient expressed understanding and agreed to proceed.  Visit Date: 02/16/2021  Today's healthcare provider: Wellington Hampshire, MD     Subjective:    Patient ID: Jennifer [name], female    DOB: October 27, 1950, 71 y.o.   MRN: [id number]  Chief Complaint  Patient presents with   Sinus Problem    Pt c/o headache left side of head, pressure left eye and pulses at times. Clear nasal drainage. No fever or chills.    HPI Chief complaint: HA, pressure Symptom onset: last pm.  This is 3rd ha in 2 wks.  L side of face and feels off balance.  If bends over-horrible taste/smell in mouth.  Clear drainage for many weeks on l side.has bad tooth on L that DDS wants removed but no pain so pt not doing.  No swelling.  HA bad enuf, not want to drive to [place].  Just saw DDS 1 wk ago and no pain tooth Pertinent negatives: no f/c/slurred speech/cough/sore throat.  No h/o ha x w/BPV Treatments tried: takes zyrtec long term Vaccine status: UTD Sick exposure: none   Past Medical History:  Diagnosis Date   Allergy    Anxiety    Constipation    Depression    Dysrhythmia    FLB - ? PAC'S COMES AND GOES   Gallbladder  problem    GERD (gastroesophageal reflux disease)    High cholesterol    Hx of adenomatous polyp of colon    2012 - [place]   Hyperlipidemia    Hypertension    Hypothyroid    Joint pain    no current problems   Osteoarthritis    PLMD (periodic limb movement disorder) 04/24/2012   Prediabetes    Seizures ([place]) 08/29/2013   only 1 seizure episode   Sleep apnea    wears CPAP nightly   Vitamin D deficiency     Past Surgical History:  Procedure Laterality Date   APPENDECTOMY     CESAREAN SECTION      x1   CHOLECYSTECTOMY  1978   COLONOSCOPY     DILATION AND CURETTAGE OF UTERUS     JOINT REPLACEMENT  1998   right knee   OSTEOTOMY PROXIMAL FEMORAL     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   Procedure: TOTAL HIP ARTHROPLASTY ANTERIOR APPROACH;  Surgeon: Frederik Pear, MD;  Location: [facility];  Service: Orthopedics;  Laterality: Right;   TOTAL KNEE ARTHROPLASTY Left 07/20/2018   Procedure: Left Knee Arthroplasty;  Surgeon: Frederik Pear, MD;  Location: [facility];  Service: Orthopedics;  Laterality: Left;    Outpatient Medications Prior to Visit  Medication Sig Dispense Refill   Biotin 1000 MCG tablet Take 1,000 mcg by mouth daily.  CALCIUM CARBONATE PO Take 600 mg by mouth daily.      cetirizine (ZYRTEC) 10 MG tablet Take 10 mg by mouth every evening.      Cholecalciferol (VITAMIN D) 50 MCG (2000 UT) tablet Take 2,000 Units by mouth daily.     citalopram (CELEXA) 40 MG tablet TAKE 1 TABLET BY MOUTH EVERY DAY 90 tablet 1   hydroxypropyl methylcellulose / hypromellose (ISOPTO TEARS / GONIOVISC) 2.5 % ophthalmic solution Place 1 drop into both eyes 2 (two) times daily as needed for dry eyes.     ketoconazole (NIZORAL) 2 % cream Apply 1 application topically daily.     levETIRAcetam (KEPPRA) 500 MG tablet Take 1 tablet (500 mg total) by mouth 2 (two) times daily. 180 tablet 3   levothyroxine (SYNTHROID) 50 MCG tablet TAKE 1 TABLET BY MOUTH EVERY DAY 90 tablet 3   losartan (COZAAR) 25 MG  tablet TAKE 2 TABLETS BY MOUTH EVERY DAY 180 tablet 1   Multiple Vitamins-Minerals (MULTIPLE VITAMINS/WOMENS PO) Take 1 tablet by mouth daily.      pravastatin (PRAVACHOL) 80 MG tablet Take 1 tablet (80 mg total) by mouth daily. 90 tablet 3   triamcinolone cream (KENALOG) 0.1 % Apply 1 application topically 2 (two) times daily. (Patient taking differently: Apply 1 application topically as needed.) 30 g 0   valACYclovir (VALTREX) 1000 MG tablet Take 1 tablet (1,000 mg total) by mouth 2 (two) times daily. 5 tablet 2   No facility-administered medications prior to visit.    Allergies  Allergen Reactions   Crestor [Rosuvastatin] Other (See Comments)    Muscle aches   Neomycin-Bacitracin Zn-Polymyx Swelling    OPTHALMIC EXPOSURE ONLY - - SWELLING EYES    Neomycin-Bacitracin-Polymyxin [Bacitracin-Neomycin-Polymyxin] Swelling    Reaction to eye drops   Codeine Rash   Feldene [Piroxicam] Rash   Penicillins Rash    Has patient had a PCN reaction causing immediate rash, facial/tongue/throat swelling, SOB or lightheadedness with hypotension:Yes Has patient had a PCN reaction causing severe rash involving mucus membranes or skin necrosis: No Has patient had a PCN reaction that required hospitalization:No Has patient had a PCN reaction occurring within the last 10 years:No If all of the above answers are "NO", then may proceed with Cephalosporin use.    Piroxicam Rash   Sulfa Antibiotics Rash   Sulfonamide Derivatives Rash        Objective:     Physical Exam Vitals and nursing note reviewed.  Constitutional:      General:  is not in acute distress.    Appearance: Normal appearance.  HENT:     Head: Normocephalic. EOMI Pulmonary:     Effort: No respiratory distress.  Musculoskeletal:     Cervical back: Normal range of motion.  Skin:    General: Skin is dry.     Coloration: Skin is not pale.  Neurological:     Mental Status: Pt is alert and oriented to person, place, and time.   CN 2-12 normal Psychiatric:        Mood and Affect: Mood normal.   Ht 5\' 3"  (1.6 m)    Wt 173 lb (78.5 kg)    LMP  (LMP Unknown)    BMI 30.65 kg/m   Wt Readings from Last 3 Encounters:  02/16/21 173 lb (78.5 kg)  02/13/21 173 lb (78.5 kg)  01/15/21 171 lb (77.6 kg)       Assessment & Plan:   Problem List Items Addressed This Visit   None  Visit Diagnoses     Acute non-recurrent pansinusitis    -  Primary   Acute nonintractable headache, unspecified headache type          HA-and bad taste at times.  On L only.  ?tooth,sinus, mass, other.  Will do doxy.  If worse, new symptoms, ANYTHING changes, to ER. Sinus drainage-?tooth(but no pain).  ?sinusitis, other.  Will do doxy trial  No orders of the defined types were placed in this encounter.   I discussed the assessment and treatment plan with the patient. The patient was provided an opportunity to ask questions and all were answered. The patient agreed with the plan and demonstrated an understanding of the instructions.   The patient was advised to call back or seek an in-person evaluation if the symptoms worsen or if the condition fails to improve as anticipated.  I provided 15 minutes of face-to-face time during this encounter.   Wellington Hampshire, MD [facility] [phone] (phone) [phone] (fax)  [place]

## 2021-02-16 NOTE — Patient Instructions (Signed)
Meds have been sent the the pharmacy °You can take tylenol for pain/fevers °If worsening symptoms, let us know or go to the Emergency room  ° ° °

## 2021-02-19 ENCOUNTER — Encounter (INDEPENDENT_AMBULATORY_CARE_PROVIDER_SITE_OTHER): Payer: Self-pay | Admitting: Family Medicine

## 2021-03-01 ENCOUNTER — Encounter: Payer: Self-pay | Admitting: Neurology

## 2021-03-02 DIAGNOSIS — Z0279 Encounter for issue of other medical certificate: Secondary | ICD-10-CM

## 2021-03-05 ENCOUNTER — Ambulatory Visit (INDEPENDENT_AMBULATORY_CARE_PROVIDER_SITE_OTHER): Payer: Medicare Other | Admitting: Family Medicine

## 2021-03-06 ENCOUNTER — Encounter: Payer: Self-pay | Admitting: Neurology

## 2021-03-08 ENCOUNTER — Encounter (INDEPENDENT_AMBULATORY_CARE_PROVIDER_SITE_OTHER): Payer: Self-pay

## 2021-03-08 ENCOUNTER — Ambulatory Visit (INDEPENDENT_AMBULATORY_CARE_PROVIDER_SITE_OTHER): Payer: Medicare Other | Admitting: Family Medicine

## 2021-03-08 NOTE — Telephone Encounter (Signed)
DOT DMV FORM completed and placed in [facility]/ [place] S office to contact pt.  I placed copy of it in my file POD 4.  Pt was notified by mychart that was complete and can pick up Monday or contact Debra in [place] to fax if that is what she wants.   ?

## 2021-03-09 DIAGNOSIS — Z20822 Contact with and (suspected) exposure to covid-19: Secondary | ICD-10-CM | POA: Diagnosis not present

## 2021-03-12 ENCOUNTER — Ambulatory Visit (INDEPENDENT_AMBULATORY_CARE_PROVIDER_SITE_OTHER): Payer: Medicare Other | Admitting: Family Medicine

## 2021-03-12 ENCOUNTER — Encounter: Payer: Self-pay | Admitting: Family Medicine

## 2021-03-12 VITALS — BP 130/76 | HR 83 | Temp 97.6°F | Resp 16 | Ht 63.0 in | Wt 175.6 lb

## 2021-03-12 DIAGNOSIS — R519 Headache, unspecified: Secondary | ICD-10-CM | POA: Diagnosis not present

## 2021-03-12 DIAGNOSIS — R42 Dizziness and giddiness: Secondary | ICD-10-CM | POA: Diagnosis not present

## 2021-03-12 MED ORDER — MECLIZINE HCL 25 MG PO TABS: 25.0000 mg | ORAL_TABLET | Freq: Three times a day (TID) | ORAL | 0 refills | Status: DC | PRN

## 2021-03-12 NOTE — Patient Instructions (Signed)
Depending on CT results, we will discuss next step.  ?Meclizine if needed for vertigo.  ?Return to the clinic or go to the nearest emergency room if any of your symptoms worsen or new symptoms occur. ? ?Vertigo ?Vertigo is the feeling that you or your surroundings are moving when they are not. This feeling can come and go at any time. Vertigo often goes away on its own. Vertigo can be dangerous if it occurs while you are doing something that could endanger yourself or others, such as driving or operating machinery. ?Your health care provider will do tests to try to determine the cause of your vertigo. Tests will also help your health care provider decide how best to treat your condition. ?Follow these instructions at home: ?Eating and drinking ?  ?Dehydration can make vertigo worse. Drink enough fluid to keep your urine pale yellow. ?Do not drink alcohol. ?Activity ?Return to your normal activities as told by your health care provider. Ask your health care provider what activities are safe for you. ?In the morning, first sit up on the side of the bed. When you feel okay, stand slowly while you hold onto something until you know that your balance is fine. ?Move slowly. Avoid sudden body or head movements or certain positions, as told by your health care provider. ?If you have trouble walking or keeping your balance, try using a cane for stability. If you feel dizzy or unstable, sit down right away. ?Avoid doing any tasks that would cause danger to you or others if vertigo occurs. ?Avoid bending down if you feel dizzy. Place items in your home so that they are easy for you to reach without bending or leaning over. ?Do not drive or use machinery if you feel dizzy. ?General instructions ?Take over-the-counter and prescription medicines only as told by your health care provider. ?Keep all follow-up visits. This is important. ?Contact a health care provider if: ?Your medicines do not relieve your vertigo or they make it  worse. ?Your condition gets worse or you develop new symptoms. ?You have a fever. ?You develop nausea or vomiting, or if nausea gets worse. ?Your family or friends notice any behavioral changes. ?You have numbness or a prickling and tingling sensation in part of your body. ?Get help right away if you: ?Are always dizzy or you faint. ?Develop severe headaches. ?Develop a stiff neck. ?Develop sensitivity to light. ?Have difficulty moving or speaking. ?Have weakness in your hands, arms, or legs. ?Have changes in your hearing or vision. ?These symptoms may represent a serious problem that is an emergency. Do not wait to see if the symptoms will go away. Get medical help right away. Call your local emergency services (911 in the [place] Do not drive yourself to the hospital. ?Summary ?Vertigo is the feeling that you or your surroundings are moving when they are not. ?Your health care provider will do tests to try to determine the cause of your vertigo. ?Follow instructions for home care. You may be told to avoid certain tasks, positions, or movements. ?Contact a health care provider if your medicines do not relieve your symptoms, or if you have a fever, nausea, vomiting, or changes in behavior. ?Get help right away if you have severe headaches or difficulty speaking, or you develop hearing or vision problems. ?This information is not intended to replace advice given to you by your health care provider. Make sure you discuss any questions you have with your health care provider. ?Document Revised: 11/24/2019 Document Reviewed:  11/24/2019 ?Elsevier Patient Education ? [place]. ? ?

## 2021-03-12 NOTE — Progress Notes (Signed)
Subjective:  Patient ID: Jennifer [name], female    DOB: Aug 20, 1950  Age: 71 y.o. MRN: [id number]  CC:  Chief Complaint  Patient presents with   Dizziness    Pt notes Lt sided facial headache, has hxt of non positional vertigo notes normally sleeping will fix this but had episode from Thursday to Friday notes some nausea coming and going with this, pressure behind Lt eye,     HPI [name] presents for   Headaches, dizziness: Pressure behind left eye for past year or so. Discussed with optho - no concerns on eye exam including IOP on testing last last year. Feels like eyelid droops at times. No change in left vision.  No tinnitus or loss of hearing.  Vertigo - woke up 5 days ago with typical positional vertigo. Similar sx's in past but not experienced in 2 years. Room spinning with sitting up. No initial meds. No focal weakness, facial droop, or slurred speech. Usually resolves in 24 hours, this time lasted into this weekend, better this am.  Took meclizine every 12 hours past 4 days.  Rare R sided nasal congestion, nothing from left.  No hx of CVA.  No change in headache with doxycycline for possible sinusitis on 02/16/21 virtual visit.  Has not discussed HA with her neurologist. No recent seizure (last one in 08/2013)  History Patient Active Problem List   Diagnosis Date Noted   Elevated LFTs 11/01/2019   Abrasion of left buttock 08/05/2018   S/P total knee arthroplasty, left 07/20/2018   Degenerative arthritis of left knee 07/17/2018   Chronic constipation 12/20/2017   Class 1 obesity due to excess calories without serious comorbidity with body mass index (BMI) of 34.0 to 34.9 in adult 12/20/2017   Primary osteoarthritis of right hip 10/20/2015   Arthritis of right hip 10/20/2015   Localization-related idiopathic epilepsy and epileptic syndromes with seizures of localized onset, not intractable, without status epilepticus ([place]) 09/09/2014   Depression  07/19/2014   Hx of adenomatous polyp of colon 03/29/2014   Seizure after head injury ([place]) 08/29/2013   OSA (obstructive sleep apnea) 04/24/2012   PLMD (periodic limb movement disorder) 04/24/2012   Fibroids, submucosal 12/18/2010   HYPERLIPIDEMIA 07/04/2008   Essential hypertension 07/04/2008   BAKER'S CYST, LEFT KNEE 07/04/2008   Shortened PR interval 07/04/2008   Past Medical History:  Diagnosis Date   Allergy    Anxiety    Constipation    Depression    Dysrhythmia    FLB - ? PAC'S COMES AND GOES   Gallbladder problem    GERD (gastroesophageal reflux disease)    High cholesterol    Hx of adenomatous polyp of colon    2012 - [facility]   Hyperlipidemia    Hypertension    Hypothyroid    Joint pain    no current problems   Osteoarthritis    PLMD (periodic limb movement disorder) 04/24/2012   Prediabetes    Seizures ([place]) 08/29/2013   only 1 seizure episode   Sleep apnea    wears CPAP nightly   Vitamin D deficiency    Past Surgical History:  Procedure Laterality Date   APPENDECTOMY     CESAREAN SECTION      x1   CHOLECYSTECTOMY  1978   COLONOSCOPY     DILATION AND CURETTAGE OF UTERUS     JOINT REPLACEMENT  1998   right knee   OSTEOTOMY PROXIMAL FEMORAL     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  Procedure: TOTAL HIP ARTHROPLASTY ANTERIOR APPROACH;  Surgeon: Frederik Pear, MD;  Location: [facility];  Service: Orthopedics;  Laterality: Right;   TOTAL KNEE ARTHROPLASTY Left 07/20/2018   Procedure: Left Knee Arthroplasty;  Surgeon: Frederik Pear, MD;  Location: [facility];  Service: Orthopedics;  Laterality: Left;   Allergies  Allergen Reactions   Crestor [Rosuvastatin] Other (See Comments)    Muscle aches   Neomycin-Bacitracin Zn-Polymyx Swelling    OPTHALMIC EXPOSURE ONLY - - SWELLING EYES    Neomycin-Bacitracin-Polymyxin [Bacitracin-Neomycin-Polymyxin] Swelling    Reaction to eye drops   Codeine Rash   Feldene [Piroxicam] Rash   Penicillins Rash    Has patient had a  PCN reaction causing immediate rash, facial/tongue/throat swelling, SOB or lightheadedness with hypotension:Yes Has patient had a PCN reaction causing severe rash involving mucus membranes or skin necrosis: No Has patient had a PCN reaction that required hospitalization:No Has patient had a PCN reaction occurring within the last 10 years:No If all of the above answers are "NO", then may proceed with Cephalosporin use.    Piroxicam Rash   Sulfa Antibiotics Rash   Sulfonamide Derivatives Rash   Prior to Admission medications   Medication Sig Start Date End Date Taking? Authorizing Provider  Biotin 1000 MCG tablet Take 1,000 mcg by mouth daily.   Yes [provider]  CALCIUM CARBONATE PO Take 600 mg by mouth daily.    Yes [provider]  cetirizine (ZYRTEC) 10 MG tablet Take 10 mg by mouth every evening.    Yes [provider]  Cholecalciferol (VITAMIN D) 50 MCG (2000 UT) tablet Take 2,000 Units by mouth daily.   Yes [provider]  citalopram (CELEXA) 40 MG tablet TAKE 1 TABLET BY MOUTH EVERY DAY 12/05/20  Yes Wendie Agreste, MD  hydroxypropyl methylcellulose / hypromellose (ISOPTO TEARS / GONIOVISC) 2.5 % ophthalmic solution Place 1 drop into both eyes 2 (two) times daily as needed for dry eyes.   Yes [provider]  ketoconazole (NIZORAL) 2 % cream Apply 1 application topically daily. 03/03/19  Yes [provider]  levETIRAcetam (KEPPRA) 500 MG tablet Take 1 tablet (500 mg total) by mouth 2 (two) times daily. 09/06/20  Yes Cameron Sprang, MD  levothyroxine (SYNTHROID) 50 MCG tablet TAKE 1 TABLET BY MOUTH EVERY DAY 08/21/20  Yes Wendie Agreste, MD  losartan (COZAAR) 25 MG tablet TAKE 2 TABLETS BY MOUTH EVERY DAY 12/05/20  Yes Wendie Agreste, MD  Multiple Vitamins-Minerals (MULTIPLE VITAMINS/WOMENS PO) Take 1 tablet by mouth daily.    Yes [provider]  pravastatin (PRAVACHOL) 80 MG tablet Take 1 tablet (80 mg total) by  mouth daily. 12/21/20  Yes Wendie Agreste, MD  triamcinolone cream (KENALOG) 0.1 % Apply 1 application topically 2 (two) times daily. Patient taking differently: Apply 1 application. topically as needed. 06/08/20  Yes Wendie Agreste, MD  valACYclovir (VALTREX) 1000 MG tablet Take 1 tablet (1,000 mg total) by mouth 2 (two) times daily. 07/02/19  Yes Elby Beck, FNP  doxycycline (VIBRA-TABS) 100 MG tablet Take 1 tablet (100 mg total) by mouth 2 (two) times daily. 02/16/21   Tawnya Crook, MD   Social History   Socioeconomic History   Marital status: Divorced    Spouse name: Not on file   Number of children: 1   Years of education: Not on file   Highest education level: Not on file  Occupational History   Occupation: Retired    Fish farm manager: [employer]  Tobacco Use   Smoking status: Never   Smokeless tobacco: Never  Vaping Use   Vaping Use: Never used  Substance and Sexual Activity   Alcohol use: No    Alcohol/week: 0.0 standard drinks   Drug use: No   Sexual activity: Never    Birth control/protection: Post-menopausal  Other Topics Concern   Not on file  Social History Narrative   Lives with blind dog and cat.    Right handed    Social Determinants of Health   Financial Resource Strain: Low Risk    Difficulty of Paying Living Expenses: Not hard at all  Food Insecurity: No Food Insecurity   Worried About Charity fundraiser in the Last Year: Never true   Ran Out of Food in the Last Year: Never true  Transportation Needs: No Transportation Needs   Lack of Transportation (Medical): No   Lack of Transportation (Non-Medical): No  Physical Activity: Insufficiently Active   Days of Exercise per Week: 4 days   Minutes of Exercise per Session: 30 min  Stress: No Stress Concern Present   Feeling of Stress : Not at all  Social Connections: Moderately Integrated   Frequency of Communication with Friends and Family: Three times a week   Frequency of Social Gatherings  with Friends and Family: More than three times a week   Attends Religious Services: More than 4 times per year   Active Member of [employer] or Organizations: Yes   Attends Music therapist: More than 4 times per year   Marital Status: Divorced  Human resources officer Violence: Not At Risk   Fear of Current or Ex-Partner: No   Emotionally Abused: No   Physically Abused: No   Sexually Abused: No    Review of Systems Per HPI.   Objective:   Vitals:   03/12/21 1136  BP: 130/76  Pulse: 83  Resp: 16  Temp: 97.6 F (36.4 C)  TempSrc: Temporal  SpO2: 96%  Weight: 175 lb 9.6 oz (79.7 kg)  Height: '5\' 3"'$  (1.6 m)     Physical Exam Vitals reviewed.  Constitutional:      Appearance: Normal appearance. She is well-developed.  HENT:     Head: Normocephalic and atraumatic.     Right Ear: Tympanic membrane, ear canal and external ear normal.     Left Ear: Tympanic membrane, ear canal and external ear normal.     Nose:     Comments: Slight tenderness with percussion of frontal and maxillary sinus on left. Eyes:     Extraocular Movements: Extraocular movements intact.     Conjunctiva/sclera: Conjunctivae normal.     Pupils: Pupils are equal, round, and reactive to light.     Comments: 1-2 beats horizontal nystagmus to the left fast component as well as to right on repeat testing.  No rotational or vertical component.  Neck:     Vascular: No carotid bruit.  Cardiovascular:     Rate and Rhythm: Normal rate and regular rhythm.     Heart sounds: Normal heart sounds.  Pulmonary:     Effort: Pulmonary effort is normal.     Breath sounds: Normal breath sounds.  Abdominal:     Palpations: Abdomen is soft. There is no pulsatile mass.     Tenderness: There is no abdominal tenderness.  Musculoskeletal:     Right lower leg: No edema.     Left lower leg: No edema.  Skin:    General: Skin is warm and dry.  Neurological:     General: No focal deficit present.     Mental Status: She is  alert and oriented to person, place, and time.     GCS: GCS eye subscore is 4. GCS verbal subscore is 5. GCS motor subscore is 6.     Cranial Nerves: No cranial nerve deficit, dysarthria or facial asymmetry.     Sensory: Sensation is intact.     Motor: Motor function is intact. No weakness, tremor, seizure activity or pronator drift.     Coordination: Coordination is intact. Coordination normal. Finger-Nose-Finger Test and Heel to [facility] Test normal. Rapid alternating movements normal.     Gait: Gait is intact.     Comments: Slowed, somewhat cautious gait but ambulates without assistance, nonantalgic.  Psychiatric:        Mood and Affect: Mood normal.        Behavior: Behavior normal.        Thought Content: Thought content normal.       Assessment & Plan:  MAKENZIE WEISNER is a 71 y.o. female . Vertigo - Plan: meclizine (ANTIVERT) 25 MG tablet, CT HEAD WO CONTRAST (5MM)  Left-sided headache - Plan: CT HEAD WO CONTRAST (5MM)  Left-sided face pain - Plan: CT HEAD WO CONTRAST (5MM) New headache over the past year plus, longstanding left-sided headache, facial pain.  Describes intermittent possible ptosis but not seen on exam.  Recent episode of vertigo with prolonged symptoms.  Nonfocal neuro exam but with persistent headache and now with longer episode of vertigo we will initially check CT head, which should give some view of sinus disease as well.  Consider MRI if initial CT negative.  ER precautions given if acute worsening symptoms.  Meclizine prescribed for vertigo which is improving, and based on symptoms likely BPV.  Recheck 1 week  Meds ordered this encounter  Medications   meclizine (ANTIVERT) 25 MG tablet    Sig: Take 1 tablet (25 mg total) by mouth 3 (three) times daily as needed for dizziness.    Dispense:  30 tablet    Refill:  0   Patient Instructions  Depending on CT results, we will discuss next step.  Meclizine if needed for vertigo.  Return to the clinic or go  to the nearest emergency room if any of your symptoms worsen or new symptoms occur.  Vertigo Vertigo is the feeling that you or your surroundings are moving when they are not. This feeling can come and go at any time. Vertigo often goes away on its own. Vertigo can be dangerous if it occurs while you are doing something that could endanger yourself or others, such as driving or operating machinery. Your health care provider will do tests to try to determine the cause of your vertigo. Tests will also help your health care provider decide how best to treat your condition. Follow these instructions at home: Eating and drinking   Dehydration can make vertigo worse. Drink enough fluid to keep your urine pale yellow. Do not drink alcohol. Activity Return to your normal activities as told by your health care provider. Ask your health care provider what activities are safe for you. In the morning, first sit up on the side of the bed. When you feel okay, stand slowly while you hold onto something until you know that your balance is fine. Move slowly. Avoid sudden body or head movements or certain positions, as told by your health care provider. If you have trouble walking or keeping your  balance, try using a cane for stability. If you feel dizzy or unstable, sit down right away. Avoid doing any tasks that would cause danger to you or others if vertigo occurs. Avoid bending down if you feel dizzy. Place items in your home so that they are easy for you to reach without bending or leaning over. Do not drive or use machinery if you feel dizzy. General instructions Take over-the-counter and prescription medicines only as told by your health care provider. Keep all follow-up visits. This is important. Contact a health care provider if: Your medicines do not relieve your vertigo or they make it worse. Your condition gets worse or you develop new symptoms. You have a fever. You develop nausea or vomiting, or  if nausea gets worse. Your family or friends notice any behavioral changes. You have numbness or a prickling and tingling sensation in part of your body. Get help right away if you: Are always dizzy or you faint. Develop severe headaches. Develop a stiff neck. Develop sensitivity to light. Have difficulty moving or speaking. Have weakness in your hands, arms, or legs. Have changes in your hearing or vision. These symptoms may represent a serious problem that is an emergency. Do not wait to see if the symptoms will go away. Get medical help right away. Call your local emergency services (911 in the [place] Do not drive yourself to the hospital. Summary Vertigo is the feeling that you or your surroundings are moving when they are not. Your health care provider will do tests to try to determine the cause of your vertigo. Follow instructions for home care. You may be told to avoid certain tasks, positions, or movements. Contact a health care provider if your medicines do not relieve your symptoms, or if you have a fever, nausea, vomiting, or changes in behavior. Get help right away if you have severe headaches or difficulty speaking, or you develop hearing or vision problems. This information is not intended to replace advice given to you by your health care provider. Make sure you discuss any questions you have with your health care provider. Document Revised: 11/24/2019 Document Reviewed: 11/24/2019 Elsevier Patient Education  2022 [place],   Merri Ray, MD [place] 03/12/21 12:36 PM

## 2021-03-14 ENCOUNTER — Encounter: Payer: Self-pay | Admitting: Family Medicine

## 2021-03-16 ENCOUNTER — Ambulatory Visit
Admission: RE | Admit: 2021-03-16 | Discharge: 2021-03-16 | Disposition: A | Discharge status: Home / Self Care | Payer: Medicare Other | Source: Ambulatory Visit | Attending: Family Medicine | Admitting: Family Medicine

## 2021-03-16 DIAGNOSIS — R519 Headache, unspecified: Secondary | ICD-10-CM | POA: Diagnosis not present

## 2021-03-16 DIAGNOSIS — R42 Dizziness and giddiness: Secondary | ICD-10-CM

## 2021-03-19 ENCOUNTER — Ambulatory Visit (INDEPENDENT_AMBULATORY_CARE_PROVIDER_SITE_OTHER): Payer: Medicare Other | Admitting: Family Medicine

## 2021-03-19 ENCOUNTER — Other Ambulatory Visit: Payer: Self-pay

## 2021-03-19 ENCOUNTER — Encounter: Payer: Self-pay | Admitting: Family Medicine

## 2021-03-19 VITALS — BP 123/63 | HR 58 | Temp 98.1°F | Resp 18 | Ht 63.0 in | Wt 177.6 lb

## 2021-03-19 DIAGNOSIS — J320 Chronic maxillary sinusitis: Secondary | ICD-10-CM

## 2021-03-19 DIAGNOSIS — R519 Headache, unspecified: Secondary | ICD-10-CM

## 2021-03-19 DIAGNOSIS — R42 Dizziness and giddiness: Secondary | ICD-10-CM | POA: Diagnosis not present

## 2021-03-19 NOTE — Patient Instructions (Signed)
Glad to hear that your symptoms have improved.  Okay to use meclizine if needed but I expect the vertigo symptoms to continue to improve this week.  If vertigo symptoms continue, I do want you to meet with ENT.  Let me know.  Also would recommend follow-up with ENT if headache or facial pain returns.  Thanks for coming in today. ?

## 2021-03-19 NOTE — Progress Notes (Signed)
Subjective:  Patient ID: Jennifer [name], female    DOB: 1950-01-10  Age: 71 y.o. MRN: [id number]  CC:  Chief Complaint  Patient presents with   Follow-up    Patient states she is here to discuss the results of her CT scan. Patient is only having some dizziness if rolling on her left side.    HPI Jennifer [name] presents for   Here for follow up dizziness. Vertigo discussed at 03/12/21 visit.  Feeling better, less HA, dizziness noted if lying on left side.  Pressure behind eye resolved. No new weakness. No facial pain.  Followed by neuro - Dr. Delice Lesch in 09/2020 Last meclizine few days ago. No dizziness today.   CT HEAD WO CONTRAST (5MM)  Result Date: 03/16/2021 CLINICAL DATA:  Headache, new or worsening, benign positional vertigo, left face pain EXAM: CT HEAD WITHOUT CONTRAST TECHNIQUE: Contiguous axial images were obtained from the base of the skull through the vertex without intravenous contrast. RADIATION DOSE REDUCTION: This exam was performed according to the departmental dose-optimization program which includes automated exposure control, adjustment of the mA and/or kV according to patient size and/or use of iterative reconstruction technique. COMPARISON:  03/01/2013 FINDINGS: Brain: No evidence of acute infarction, hemorrhage, cerebral edema, mass, mass effect, or midline shift. No hydrocephalus or extra-axial fluid collection. 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 Ventricles and sulci are within normal limits for age. Periventricular white matter changes, likely the sequela of chronic small vessel ischemic disease. Vascular: No hyperdense vessel. Skull: Hyperostosis frontalis. Negative for fracture or focal lesion. Sinuses/Orbits: Complete opacification of the left maxillary sinus, with significant osseous thickening, consistent with chronic sinusitis. The sinuses are otherwise unremarkable. Other: The mastoid air cells are well  aerated. IMPRESSION: IMPRESSION 1.  No acute intracranial process. 2.  Chronic left maxillary sinusitis. Electronically Signed   By: Merilyn Baba M.D.   On: 03/16/2021 12:47     History Patient Active Problem List   Diagnosis Date Noted   Elevated LFTs 11/01/2019   Abrasion of left buttock 08/05/2018   S/P total knee arthroplasty, left 07/20/2018   Degenerative arthritis of left knee 07/17/2018   Chronic constipation 12/20/2017   Class 1 obesity due to excess calories without serious comorbidity with body mass index (BMI) of 34.0 to 34.9 in adult 12/20/2017   Primary osteoarthritis of right hip 10/20/2015   Arthritis of right hip 10/20/2015   Localization-related idiopathic epilepsy and epileptic syndromes with seizures of localized onset, not intractable, without status epilepticus ([place]) 09/09/2014   Depression 07/19/2014   Hx of adenomatous polyp of colon 03/29/2014   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 PAC'S COMES AND GOES   Gallbladder problem    GERD (gastroesophageal reflux disease)    High cholesterol    Hx of adenomatous polyp of colon    2012 - [place]   Hyperlipidemia    Hypertension    Hypothyroid    Joint pain    no current problems   Osteoarthritis    PLMD (periodic limb movement disorder)  04/24/2012   Prediabetes    Seizures ([place]) 08/29/2013   only 1 seizure episode   Sleep apnea    wears CPAP nightly   Vitamin D deficiency    Past Surgical History:  Procedure Laterality Date   APPENDECTOMY     CESAREAN SECTION      x1   CHOLECYSTECTOMY  1978   COLONOSCOPY      DILATION AND CURETTAGE OF UTERUS     JOINT REPLACEMENT  1998   right knee   OSTEOTOMY PROXIMAL FEMORAL     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   Procedure: TOTAL HIP ARTHROPLASTY ANTERIOR APPROACH;  Surgeon: Frederik Pear, MD;  Location: [facility];  Service: Orthopedics;  Laterality: Right;   TOTAL KNEE ARTHROPLASTY Left 07/20/2018   Procedure: Left Knee Arthroplasty;  Surgeon: Frederik Pear, MD;  Location: [facility];  Service: Orthopedics;  Laterality: Left;   Allergies  Allergen Reactions   Crestor [Rosuvastatin] Other (See Comments)    Muscle aches   Neomycin-Bacitracin Zn-Polymyx Swelling    OPTHALMIC EXPOSURE ONLY - - SWELLING EYES    Neomycin-Bacitracin-Polymyxin [Bacitracin-Neomycin-Polymyxin] Swelling    Reaction to eye drops   Codeine Rash   Feldene [Piroxicam] Rash   Penicillins Rash    Has patient had a PCN reaction causing immediate rash, facial/tongue/throat swelling, SOB or lightheadedness with hypotension:Yes Has patient had a PCN reaction causing severe rash involving mucus membranes or skin necrosis: No Has patient had a PCN reaction that required hospitalization:No Has patient had a PCN reaction occurring within the last 10 years:No If all of the above answers are "NO", then may proceed with Cephalosporin use.    Piroxicam Rash   Sulfa Antibiotics Rash   Sulfonamide Derivatives Rash   Prior to Admission medications   Medication Sig Start Date End Date Taking? Authorizing Provider  Biotin 1000 MCG tablet Take 1,000 mcg by mouth daily.   Yes [provider]  CALCIUM CARBONATE PO Take 600 mg by mouth daily.    Yes [provider]  cetirizine (ZYRTEC) 10 MG tablet Take 10 mg by mouth every evening.    Yes [provider]  Cholecalciferol (VITAMIN D) 50 MCG (2000 UT) tablet Take 2,000 Units by mouth daily.   Yes [provider]  citalopram (CELEXA) 40 MG tablet TAKE 1 TABLET BY MOUTH EVERY DAY 12/05/20  Yes Wendie Agreste, MD   hydroxypropyl methylcellulose / hypromellose (ISOPTO TEARS / GONIOVISC) 2.5 % ophthalmic solution Place 1 drop into both eyes 2 (two) times daily as needed for dry eyes.   Yes [provider]  ketoconazole (NIZORAL) 2 % cream Apply 1 application topically daily. 03/03/19  Yes [provider]  levETIRAcetam (KEPPRA) 500 MG tablet Take 1 tablet (500 mg total) by mouth 2 (two) times daily. 09/06/20  Yes Cameron Sprang, MD  levothyroxine (SYNTHROID) 50 MCG tablet TAKE 1 TABLET BY MOUTH EVERY DAY 08/21/20  Yes Wendie Agreste, MD  losartan (COZAAR) 25 MG tablet TAKE 2 TABLETS BY MOUTH EVERY DAY 12/05/20  Yes Wendie Agreste, MD  meclizine (ANTIVERT) 25 MG tablet Take 1 tablet (25 mg total) by mouth 3 (three) times daily as needed for dizziness. 03/12/21  Yes Wendie Agreste, MD  Multiple Vitamins-Minerals (MULTIPLE VITAMINS/WOMENS PO) Take 1 tablet by mouth daily.    Yes [provider]  pravastatin (PRAVACHOL) 80 MG tablet Take 1 tablet (80 mg total) by mouth daily. 12/21/20  Yes Wendie Agreste, MD  triamcinolone cream (KENALOG) 0.1 %  Apply 1 application topically 2 (two) times daily. Patient taking differently: Apply 1 application. topically as needed. 06/08/20  Yes Wendie Agreste, MD  valACYclovir (VALTREX) 1000 MG tablet Take 1 tablet (1,000 mg total) by mouth 2 (two) times daily. 07/02/19  Yes Elby Beck, FNP  doxycycline (VIBRA-TABS) 100 MG tablet Take 1 tablet (100 mg total) by mouth 2 (two) times daily. 02/16/21   Tawnya Crook, MD   Social History   Socioeconomic History   Marital status: Divorced    Spouse name: Not on file   Number of children: 1   Years of education: Not on file   Highest education level: Not on file  Occupational History   Occupation: Retired    Employer: [employer]  Tobacco Use   Smoking status: Never   Smokeless tobacco: Never  Vaping Use   Vaping Use: Never used  Substance and Sexual Activity   Alcohol use: No     Alcohol/week: 0.0 standard drinks   Drug use: No   Sexual activity: Never    Birth control/protection: Post-menopausal  Other Topics Concern   Not on file  Social History Narrative   Lives with blind dog and cat.    Right handed    Social Determinants of Health   Financial Resource Strain: Low Risk    Difficulty of Paying Living Expenses: Not hard at all  Food Insecurity: No Food Insecurity   Worried About Charity fundraiser in the Last Year: Never true   Ran Out of Food in the Last Year: Never true  Transportation Needs: No Transportation Needs   Lack of Transportation (Medical): No   Lack of Transportation (Non-Medical): No  Physical Activity: Insufficiently Active   Days of Exercise per Week: 4 days   Minutes of Exercise per Session: 30 min  Stress: No Stress Concern Present   Feeling of Stress : Not at all  Social Connections: Moderately Integrated   Frequency of Communication with Friends and Family: Three times a week   Frequency of Social Gatherings with Friends and Family: More than three times a week   Attends Religious Services: More than 4 times per year   Active Member of Clubs or Organizations: Yes   Attends Music therapist: More than 4 times per year   Marital Status: Divorced  Human resources officer Violence: Not At Risk   Fear of Current or Ex-Partner: No   Emotionally Abused: No   Physically Abused: No   Sexually Abused: No    Review of Systems Per HPI  Objective:   Vitals:   03/19/21 1145  BP: 123/63  Pulse: (!) 58  Resp: 18  Temp: 98.1 F (36.7 C)  TempSrc: Temporal  SpO2: 100%  Weight: 177 lb 9.6 oz (80.6 kg)  Height: '5\' 3"'$  (1.6 m)     Physical Exam Vitals reviewed.  Constitutional:      Appearance: Normal appearance. She is well-developed.  HENT:     Head: Normocephalic and atraumatic.     Comments: Frontal and maxillary sinuses nontender. Eyes:     Extraocular Movements: Extraocular movements intact.      Conjunctiva/sclera: Conjunctivae normal.     Pupils: Pupils are equal, round, and reactive to light.     Comments: No nystagmus.  Neck:     Vascular: No carotid bruit.  Cardiovascular:     Rate and Rhythm: Normal rate and regular rhythm.     Heart sounds: Normal heart sounds.  Pulmonary:  Effort: Pulmonary effort is normal.     Breath sounds: Normal breath sounds.  Abdominal:     Palpations: Abdomen is soft. There is no pulsatile mass.     Tenderness: There is no abdominal tenderness.  Musculoskeletal:     Right lower leg: No edema.     Left lower leg: No edema.  Skin:    General: Skin is warm and dry.  Neurological:     General: No focal deficit present.     Mental Status: She is alert and oriented to person, place, and time.     Motor: No weakness, abnormal muscle tone or pronator drift.     Coordination: Coordination is intact.     Gait: Gait is intact.  Psychiatric:        Mood and Affect: Mood normal.        Behavior: Behavior normal.       Assessment & Plan:  Jennifer [name] is a 71 y.o. female . Vertigo  Left-sided headache  Left-sided face pain  Chronic maxillary sinusitis Vertigo improving, still appears to be peripheral vertigo with some prolonged symptoms.  Discussed ENT eval but as she is improving with resolution of headache, facial pain, decided against referral at this time.  If vertigo does not resolve the next week did recommend ENT follow-up.  Chronic sinusitis noted, does remember having sinus issues in the past.  Now with resolution of symptoms we will hold on antibiotics for further evaluation, ENT option as well.  No orders of the defined types were placed in this encounter.  Patient Instructions  Glad to hear that your symptoms have improved.  Okay to use meclizine if needed but I expect the vertigo symptoms to continue to improve this week.  If vertigo symptoms continue, I do want you to meet with ENT.  Let me know.  Also would recommend  follow-up with ENT if headache or facial pain returns.  Thanks for coming in today.    Signed,   Merri Ray, MD [place], [facility] 03/19/21 1:03 PM

## 2021-03-26 DIAGNOSIS — H52223 Regular astigmatism, bilateral: Secondary | ICD-10-CM | POA: Diagnosis not present

## 2021-03-26 DIAGNOSIS — H04123 Dry eye syndrome of bilateral lacrimal glands: Secondary | ICD-10-CM | POA: Diagnosis not present

## 2021-03-26 DIAGNOSIS — H35372 Puckering of macula, left eye: Secondary | ICD-10-CM | POA: Diagnosis not present

## 2021-03-26 DIAGNOSIS — H25813 Combined forms of age-related cataract, bilateral: Secondary | ICD-10-CM | POA: Diagnosis not present

## 2021-03-29 ENCOUNTER — Encounter (INDEPENDENT_AMBULATORY_CARE_PROVIDER_SITE_OTHER): Payer: Self-pay | Admitting: Family Medicine

## 2021-03-29 ENCOUNTER — Other Ambulatory Visit: Payer: Self-pay

## 2021-03-29 ENCOUNTER — Ambulatory Visit (INDEPENDENT_AMBULATORY_CARE_PROVIDER_SITE_OTHER): Payer: Medicare Other | Admitting: Family Medicine

## 2021-03-29 VITALS — BP 126/73 | HR 66 | Temp 98.8°F | Ht 63.0 in | Wt 172.0 lb

## 2021-03-29 DIAGNOSIS — Z6830 Body mass index (BMI) 30.0-30.9, adult: Secondary | ICD-10-CM | POA: Diagnosis not present

## 2021-03-29 DIAGNOSIS — I10 Essential (primary) hypertension: Secondary | ICD-10-CM

## 2021-03-29 DIAGNOSIS — E669 Obesity, unspecified: Secondary | ICD-10-CM

## 2021-04-02 NOTE — Progress Notes (Signed)
? ? ? ?Chief Complaint:  ? ?OBESITY ?Jennifer [name] is here to discuss her progress with her obesity treatment plan along with follow-up of her obesity related diagnoses. Jennifer [name] is on the Category 1 Plan and states she is following her eating plan approximately 50% of the time. Jennifer [name] states she is doing 0 minutes 0 times per week. ? ?Today's visit was #: 59 ?Starting weight: 208 lbs ?Starting date: 08/25/2018 ?Today's weight: 172 lbs ?Today's date: 03/29/2021 ?Total lbs lost to date: 61 ?Total lbs lost since last in-office visit: 1 ? ?Interim History: Jennifer [name] has been very busy since her last visit. She has had prolonged issues with vertigo and sinuitis as well as increased traveling, but she tried to make smarter choices. She still struggles with meeting her protein goals. ? ?Subjective:  ? ?1. Essential hypertension ?Jennifer [name]'s blood pressure is stable on her medications. She is working on getting back to meal planning and weight loss. ? ?Assessment/Plan:  ? ?1. Essential hypertension ?Jennifer [name] is to continue to work on her diet and weight loss.  ? ?2. Obesity with current BMI of 30.6 ?Jennifer [name] is currently in the action stage of change. As such, her goal is to continue with weight loss efforts. She has agreed to the Category 1 Plan.  ? ?Behavioral modification strategies: increasing lean protein intake and meal planning and cooking strategies. ? ?Jennifer [name] has agreed to follow-up with our clinic in 3 weeks. She was informed of the importance of frequent follow-up visits to maximize her success with intensive lifestyle modifications for her multiple health conditions.  ? ?Objective:  ? ?Blood pressure 126/73, pulse 66, temperature 98.8 ?F (37.1 ?C), height '5\' 3"'$  (1.6 m), weight 172 lb (78 kg), SpO2 95 %. ?Body mass index is 30.47 kg/m?. ? ?General: Cooperative, alert, well developed, in no acute distress. ?HEENT: Conjunctivae and lids unremarkable. ?Cardiovascular: Regular rhythm.  ?Lungs: Normal work of  breathing. ?Neurologic: No focal deficits.  ? ?Lab Results  ?Component Value Date  ? CREATININE 0.73 01/15/2021  ? BUN 19 01/15/2021  ? NA 141 01/15/2021  ? K 4.9 01/15/2021  ? CL 101 01/15/2021  ? CO2 25 01/15/2021  ? ?Lab Results  ?Component Value Date  ? ALT 20 01/15/2021  ? AST 21 01/15/2021  ? ALKPHOS 104 01/15/2021  ? BILITOT 0.6 01/15/2021  ? ?Lab Results  ?Component Value Date  ? HGBA1C 5.0 12/25/2020  ? HGBA1C 5.2 01/17/2020  ? HGBA1C 5.4 05/05/2019  ? HGBA1C 5.4 12/14/2018  ? HGBA1C 5.4 08/25/2018  ? ?Lab Results  ?Component Value Date  ? INSULIN 6.8 12/25/2020  ? INSULIN 4.7 01/17/2020  ? INSULIN 13.5 05/05/2019  ? INSULIN 12.8 12/14/2018  ? INSULIN 14.7 08/25/2018  ? ?Lab Results  ?Component Value Date  ? TSH 1.930 12/25/2020  ? ?Lab Results  ?Component Value Date  ? CHOL 229 (H) 12/25/2020  ? HDL 60 12/25/2020  ? LDLCALC 142 (H) 12/25/2020  ? LDLDIRECT 96.0 05/04/2018  ? TRIG 154 (H) 12/25/2020  ? CHOLHDL 3.8 12/25/2020  ? ?Lab Results  ?Component Value Date  ? VD25OH 50.6 12/25/2020  ? VD25OH 48.7 06/19/2020  ? VD25OH 48.0 01/17/2020  ? ?Lab Results  ?Component Value Date  ? WBC 4.3 12/25/2020  ? HGB 14.0 12/25/2020  ? HCT 41.0 12/25/2020  ? MCV 91 12/25/2020  ? PLT 355 12/25/2020  ? ?Lab Results  ?Component Value Date  ? IRON 110 11/01/2019  ? TIBC 382 11/01/2019  ? ?Attestation Statements:  ? ?Reviewed by clinician on  day of visit: allergies, medications, problem list, medical history, surgical history, family history, social history, and previous encounter notes. ? ?Time spent on visit including pre-visit chart review and post-visit care and charting was 30 minutes.  ? ? ?I, Trixie Dredge, am acting as transcriptionist for Dennard Nip, MD. ? ?I have reviewed the above documentation for accuracy and completeness, and I agree with the above. -  Dennard Nip, MD ? ? ?

## 2021-04-10 ENCOUNTER — Encounter: Payer: Self-pay | Admitting: Family Medicine

## 2021-04-11 ENCOUNTER — Telehealth (INDEPENDENT_AMBULATORY_CARE_PROVIDER_SITE_OTHER): Payer: Medicare Other | Admitting: Family

## 2021-04-11 ENCOUNTER — Encounter: Payer: Self-pay | Admitting: Family

## 2021-04-11 VITALS — Temp 98.4°F | Ht 63.0 in | Wt 173.0 lb

## 2021-04-11 DIAGNOSIS — U071 COVID-19: Secondary | ICD-10-CM | POA: Diagnosis not present

## 2021-04-11 MED ORDER — MOLNUPIRAVIR EUA 200MG CAPSULE: 4.0000 | ORAL_CAPSULE | Freq: Two times a day (BID) | ORAL | 0 refills | Status: AC

## 2021-04-11 NOTE — Progress Notes (Signed)
? ? ?MyChart Video Visit ? ? ? ?Virtual Visit via Video Note  ? ?This visit type was conducted due to national recommendations for restrictions regarding the COVID-19 Pandemic (e.g. social distancing) in an effort to limit this patient's exposure and mitigate transmission in our community. This patient is at least at moderate risk for complications without adequate follow up. This format is felt to be most appropriate for this patient at this time. Physical exam was limited by quality of the video and audio technology used for the visit. CMA was able to get the patient set up on a video visit. ? ?Patient location: Home. Patient and provider in visit ?Provider location: Office ? ?I discussed the limitations of evaluation and management by telemedicine and the availability of in person appointments. The patient expressed understanding and agreed to proceed. ? ?Visit Date: 04/11/2021 ? ?Today's healthcare provider: Jeanie Sewer, NP  ? ? ? ?Subjective:  ? ? Patient ID: Jennifer [name], female    DOB: Feb 11, 1950, 71 y.o.   MRN: [id number] ? ?Chief Complaint  ?Patient presents with  ? Covid Positive  ?  Tested positive on 4/3, Headache , chills, fatigue  ? ? ?HPI ?Upper Respiratory Infection: Symptoms include  chills, fatigue, headache, no fever or cough .  ?Onset of symptoms was 2 days ago, unchanged since that time. She is drinking moderate amounts of fluids. Evaluation to date: none.  ?Treatment to date: none. ? ? ? ?Past Medical History:  ?Diagnosis Date  ? Allergy   ? Anxiety   ? Constipation   ? Depression   ? Dysrhythmia   ? FLB - ? PAC'S COMES AND GOES  ? Gallbladder problem   ? GERD (gastroesophageal reflux disease)   ? High cholesterol   ? Hx of adenomatous polyp of colon   ? 2012 [facility] [place]  ? Hyperlipidemia   ? Hypertension   ? Hypothyroid   ? Joint pain   ? no current problems  ? Osteoarthritis   ? PLMD (periodic limb movement disorder) 04/24/2012  ? Prediabetes   ? Seizures ([place]) 08/29/2013  ? only 1  seizure episode  ? Sleep apnea   ? wears CPAP nightly  ? Vitamin D deficiency   ? ? ?Past Surgical History:  ?Procedure Laterality Date  ? APPENDECTOMY    ? CESAREAN SECTION    ?  x1  ? CHOLECYSTECTOMY  1978  ? COLONOSCOPY    ? DILATION AND CURETTAGE OF UTERUS    ? JOINT REPLACEMENT  1998  ? right knee  ? OSTEOTOMY PROXIMAL FEMORAL    ? 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  ? Procedure: TOTAL HIP ARTHROPLASTY ANTERIOR APPROACH;  Surgeon: Frederik Pear, MD;  Location: [facility];  Service: Orthopedics;  Laterality: Right;  ? TOTAL KNEE ARTHROPLASTY Left 07/20/2018  ? Procedure: Left Knee Arthroplasty;  Surgeon: Frederik Pear, MD;  Location: [facility];  Service: Orthopedics;  Laterality: Left;  ? ? ?Outpatient Medications Prior to Visit  ?Medication Sig Dispense Refill  ? Biotin 1000 MCG tablet Take 1,000 mcg by mouth daily.    ? CALCIUM CARBONATE PO Take 600 mg by mouth daily.     ? cetirizine (ZYRTEC) 10 MG tablet Take 10 mg by mouth every evening.     ? Cholecalciferol (VITAMIN D) 50 MCG (2000 UT) tablet Take 2,000 Units by mouth daily.    ? citalopram (CELEXA) 40 MG tablet TAKE 1 TABLET BY MOUTH EVERY DAY 90 tablet 1  ? hydroxypropyl methylcellulose / hypromellose (ISOPTO  TEARS / GONIOVISC) 2.5 % ophthalmic solution Place 1 drop into both eyes 2 (two) times daily as needed for dry eyes.    ? ketoconazole (NIZORAL) 2 % cream Apply 1 application topically daily.    ? levETIRAcetam (KEPPRA) 500 MG tablet Take 1 tablet (500 mg total) by mouth 2 (two) times daily. 180 tablet 3  ? levothyroxine (SYNTHROID) 50 MCG tablet TAKE 1 TABLET BY MOUTH EVERY DAY 90 tablet 3  ? losartan (COZAAR) 25 MG tablet TAKE 2 TABLETS BY MOUTH EVERY DAY 180 tablet 1  ? Multiple Vitamins-Minerals (MULTIPLE VITAMINS/WOMENS PO) Take 1 tablet by mouth daily.     ? pravastatin (PRAVACHOL) 80 MG tablet Take 1 tablet (80 mg total) by mouth daily. 90 tablet 3  ? triamcinolone cream (KENALOG) 0.1 % Apply 1 application topically 2 (two) times daily. (Patient  taking differently: Apply 1 application. topically as needed.) 30 g 0  ? valACYclovir (VALTREX) 1000 MG tablet Take 1 tablet (1,000 mg total) by mouth 2 (two) times daily. 5 tablet 2  ? ?No facility-administered medications prior to visit.  ? ? ?Allergies  ?Allergen Reactions  ? Crestor [Rosuvastatin] Other (See Comments)  ?  Muscle aches  ? Neomycin-Bacitracin Zn-Polymyx Swelling  ?  OPTHALMIC EXPOSURE ONLY ?- - SWELLING EYES ?  ? Neomycin-Bacitracin-Polymyxin [Bacitracin-Neomycin-Polymyxin] Swelling  ?  Reaction to eye drops  ? Codeine Rash  ? Feldene [Piroxicam] Rash  ? Penicillins Rash  ?  Has patient had a PCN reaction causing immediate rash, facial/tongue/throat swelling, SOB or lightheadedness with hypotension:Yes ?Has patient had a PCN reaction causing severe rash involving mucus membranes or skin necrosis: No ?Has patient had a PCN reaction that required hospitalization:No ?Has patient had a PCN reaction occurring within the last 10 years:No ?If all of the above answers are "NO", then may proceed with Cephalosporin use. ?  ? Piroxicam Rash  ? Sulfa Antibiotics Rash  ? Sulfonamide Derivatives Rash  ? ? ? ?   ?Objective:  ?  ? ?Physical Exam ?Vitals and nursing note reviewed.  ?Constitutional:   ?   General: She is not in acute distress. ?   Appearance: Normal appearance.  ?HENT:  ?   Head: Normocephalic.  ?Pulmonary:  ?   Effort: No respiratory distress.  ?Musculoskeletal:  ?   Cervical back: Normal range of motion.  ?Skin: ?   General: Skin is dry.  ?   Coloration: Skin is not pale.  ?Neurological:  ?   Mental Status: She is alert and oriented to person, place, and time.  ?Psychiatric:     ?   Mood and Affect: Mood normal.  ? ?Temp 98.4 ?F (36.9 ?C) (Oral)   Ht '5\' 3"'$  (1.6 m)   Wt 173 lb (78.5 kg)   LMP  (LMP Unknown)   BMI 30.65 kg/m?  ? ?Wt Readings from Last 3 Encounters:  ?04/11/21 173 lb (78.5 kg)  ?03/29/21 172 lb (78 kg)  ?03/19/21 177 lb 9.6 oz (80.6 kg)  ? ? ?   ?Assessment & Plan:  ? ?Problem  List Items Addressed This Visit   ?None ?Visit Diagnoses   ? ? COVID-19    -  Primary  ? Sending Molnupiravir, pt advised of FDA label for emergency use, how to take, & SE. Advised of CDC guidelines for masking. Advised of safe practice guidelines. Encouraged to monitor & notify office of any worsening symptoms: increased shortness of breath, weakness, and signs of dehydration. Instructed to rest and hydrate well.   ? ?  Relevant Medications  ? molnupiravir EUA (LAGEVRIO) 200 mg CAPS capsule  ? ?  ? ?Meds ordered this encounter  ?Medications  ? molnupiravir EUA (LAGEVRIO) 200 mg CAPS capsule  ?  Sig: Take 4 capsules (800 mg total) by mouth 2 (two) times daily for 5 days.  ?  Dispense:  40 capsule  ?  Refill:  0  ?  Order Specific Question:   Supervising Provider  ?  Answer:   ANDY, CAMILLE L [[id number]]  ? ? ?I discussed the assessment and treatment plan with the patient. The patient was provided an opportunity to ask questions and all were answered. The patient agreed with the plan and demonstrated an understanding of the instructions. ?  ?The patient was advised to call back or seek an in-person evaluation if the symptoms worsen or if the condition fails to improve as anticipated. ? ?I provided 21 minutes of face-to-face time during this encounter. ? ? ?Jeanie Sewer, NP ?[place] ?[phone] (phone) ?[phone] (fax) ? ?[facility]  ?

## 2021-04-13 DIAGNOSIS — Z20822 Contact with and (suspected) exposure to covid-19: Secondary | ICD-10-CM | POA: Diagnosis not present

## 2021-04-16 ENCOUNTER — Telehealth (INDEPENDENT_AMBULATORY_CARE_PROVIDER_SITE_OTHER): Payer: Medicare Other | Admitting: Family Medicine

## 2021-04-16 ENCOUNTER — Encounter (INDEPENDENT_AMBULATORY_CARE_PROVIDER_SITE_OTHER): Payer: Self-pay | Admitting: Family Medicine

## 2021-04-16 DIAGNOSIS — Z6830 Body mass index (BMI) 30.0-30.9, adult: Secondary | ICD-10-CM

## 2021-04-16 DIAGNOSIS — E669 Obesity, unspecified: Secondary | ICD-10-CM

## 2021-04-16 DIAGNOSIS — U071 COVID-19: Secondary | ICD-10-CM | POA: Diagnosis not present

## 2021-04-17 DIAGNOSIS — L570 Actinic keratosis: Secondary | ICD-10-CM | POA: Diagnosis not present

## 2021-04-17 DIAGNOSIS — D225 Melanocytic nevi of trunk: Secondary | ICD-10-CM | POA: Diagnosis not present

## 2021-04-17 DIAGNOSIS — L821 Other seborrheic keratosis: Secondary | ICD-10-CM | POA: Diagnosis not present

## 2021-04-17 DIAGNOSIS — D1801 Hemangioma of skin and subcutaneous tissue: Secondary | ICD-10-CM | POA: Diagnosis not present

## 2021-04-17 DIAGNOSIS — L814 Other melanin hyperpigmentation: Secondary | ICD-10-CM | POA: Diagnosis not present

## 2021-04-17 DIAGNOSIS — D2272 Melanocytic nevi of left lower limb, including hip: Secondary | ICD-10-CM | POA: Diagnosis not present

## 2021-04-17 DIAGNOSIS — L578 Other skin changes due to chronic exposure to nonionizing radiation: Secondary | ICD-10-CM | POA: Diagnosis not present

## 2021-04-17 NOTE — Progress Notes (Signed)
? ? ?TeleHealth Visit:  ?Due to the COVID-19 pandemic, this visit was completed with telemedicine (audio/video) technology to reduce patient and provider exposure as well as to preserve personal protective equipment.  ? ?Jennifer [name] has verbally consented to this TeleHealth visit. The patient is located at home, the provider is located at the [facility] office. The participants in this visit include the listed provider and patient. The visit was conducted today via MyChart video. ? ?Chief Complaint: OBESITY ?Jennifer [name] is here to discuss her progress with her obesity treatment plan along with follow-up of her obesity related diagnoses. Jennifer [name] is on the Category 1 Plan and states she is following her eating plan approximately 90% of the time. Jennifer [name] states she is doing 0 minutes 0 times per week. ? ?Today's visit was #: 77 ?Starting weight: 208 lbs ?Starting date: 08/25/2018 ? ?Interim History: Jennifer [name]'s visit today is via virtual due to recovering from [place]. She feels she may have lost some weight while sick, but she did unusually well with her eating plan and avoiding much of Easter temptations. She is considering starting back to exercise.  ? ?Subjective:  ? ?1. COVID-19 ?Jennifer [name] is 10 days after her first symptoms of COVID and she is recovering. She has finished her 5 days of antiviral medications. She states she is feeling better today, and she is ready to start getting back to her routine.  ? ?Assessment/Plan:  ? ?1. COVID-19 ?Jennifer [name] is ok to slowly restart exercising and she was warned not to over exert herself for a few days. Will watch for signs of long COVID.  ? ?2. Obesity with current BMI of 30.6 ?Jennifer [name] is currently in the action stage of change. As such, her goal is to continue with weight loss efforts. She has agreed to the Category 1 Plan.  ? ?Exercise goals: She is to start back slowly and if her post-COVID cough increases she  should wait a few days before exercising again.   ? ?Behavioral modification strategies: increasing lean protein intake and no skipping meals. ? ?Jennifer [name] has agreed to follow-up with our clinic in 3 weeks. She was informed of the importance of frequent follow-up visits to maximize her success with intensive lifestyle modifications for her multiple health conditions. ? ?Objective:  ? ?VITALS: Per patient if applicable, see vitals. ?GENERAL: Alert and in no acute distress. ?CARDIOPULMONARY: No increased WOB. Speaking in clear sentences.  ?PSYCH: Pleasant and cooperative. Speech normal rate and rhythm. Affect is appropriate. Insight and judgement are appropriate. Attention is focused, linear, and appropriate.  ?NEURO: Oriented as arrived to appointment on time with no prompting.  ? ?Lab Results  ?Component Value Date  ? CREATININE 0.73 01/15/2021  ? BUN 19 01/15/2021  ? NA 141 01/15/2021  ? K 4.9 01/15/2021  ? CL 101 01/15/2021  ? CO2 25 01/15/2021  ? ?Lab Results  ?Component Value Date  ? ALT 20 01/15/2021  ? AST 21 01/15/2021  ? ALKPHOS 104 01/15/2021  ? BILITOT 0.6 01/15/2021  ? ?Lab Results  ?Component Value Date  ? HGBA1C 5.0 12/25/2020  ? HGBA1C 5.2 01/17/2020  ? HGBA1C 5.4 05/05/2019  ? HGBA1C 5.4 12/14/2018  ? HGBA1C 5.4 08/25/2018  ? ?Lab Results  ?Component Value Date  ? INSULIN 6.8 12/25/2020  ? INSULIN 4.7 01/17/2020  ? INSULIN 13.5 05/05/2019  ? INSULIN 12.8 12/14/2018  ? INSULIN 14.7 08/25/2018  ? ?Lab Results  ?Component Value Date  ? TSH 1.930 12/25/2020  ? ?Lab Results  ?Component Value  Date  ? CHOL 229 (H) 12/25/2020  ? HDL 60 12/25/2020  ? LDLCALC 142 (H) 12/25/2020  ? LDLDIRECT 96.0 05/04/2018  ? TRIG 154 (H) 12/25/2020  ? CHOLHDL 3.8 12/25/2020  ? ?Lab Results  ?Component Value Date  ? VD25OH 50.6 12/25/2020  ? VD25OH 48.7 06/19/2020  ? VD25OH 48.0 01/17/2020  ? ?Lab Results  ?Component Value Date  ? WBC 4.3 12/25/2020  ? HGB 14.0 12/25/2020  ? HCT 41.0 12/25/2020  ? MCV 91 12/25/2020  ? PLT 355 12/25/2020  ? ?Lab Results  ?Component Value Date   ? IRON 110 11/01/2019  ? TIBC 382 11/01/2019  ? ? ?Attestation Statements:  ? ?Reviewed by clinician on day of visit: allergies, medications, problem list, medical history, surgical history, family history, social history, and previous encounter notes. ? ?Time spent on visit including pre-visit chart review and post-visit charting and care was 25 minutes.  ? ? ?I, Trixie Dredge, am acting as transcriptionist for Dennard Nip, MD. ? ?I have reviewed the above documentation for accuracy and completeness, and I agree with the above. - Dennard Nip, MD ? ? ?

## 2021-05-07 ENCOUNTER — Ambulatory Visit (INDEPENDENT_AMBULATORY_CARE_PROVIDER_SITE_OTHER): Payer: Medicare Other | Admitting: Family Medicine

## 2021-05-07 ENCOUNTER — Encounter (INDEPENDENT_AMBULATORY_CARE_PROVIDER_SITE_OTHER): Payer: Self-pay | Admitting: Family Medicine

## 2021-05-07 VITALS — BP 114/68 | HR 65 | Temp 97.7°F | Ht 63.0 in | Wt 169.0 lb

## 2021-05-07 DIAGNOSIS — Z6830 Body mass index (BMI) 30.0-30.9, adult: Secondary | ICD-10-CM

## 2021-05-07 DIAGNOSIS — E559 Vitamin D deficiency, unspecified: Secondary | ICD-10-CM | POA: Diagnosis not present

## 2021-05-07 DIAGNOSIS — E669 Obesity, unspecified: Secondary | ICD-10-CM | POA: Diagnosis not present

## 2021-05-22 NOTE — Progress Notes (Signed)
Chief Complaint:   OBESITY Jennifer [name] is here to discuss her progress with her obesity treatment plan along with follow-up of her obesity related diagnoses. Jennifer [name] is on the Category 1 Plan and states she is following her eating plan approximately 90% of the time. Jennifer [name] states she is walking for 30 minutes 4 times per week.  Today's visit was #: 16 Starting weight: 208 lbs Starting date: 08/25/2018 Today's weight: 169 lbs Today's date: 05/07/2021 Total lbs lost to date: 39 Total lbs lost since last in-office visit: 3  Interim History: Jennifer [name] continues to do well with weight loss. She is on her Category 1 plan. She is working on making her dinners more interesting. She continues to work with walking videos regularly.   Subjective:   1. Vitamin D deficiency Jennifer [name] is on OTC Vitamin D, and she denies nausea, vomiting, or muscle weakness.   Assessment/Plan:   1. Vitamin D deficiency We will check labs at her next visit. Jennifer [name] will continue Vitamin D for now.   2. Obesity, Current BMI 30.0 Jennifer [name] is currently in the action stage of change. As such, her goal is to continue with weight loss efforts. She has agreed to the Category 1 Plan.   We will recheck fasting labs at her next office visit.   Exercise goals: As is.   Behavioral modification strategies: increasing lean protein intake and meal planning and cooking strategies.  Jennifer [name] has agreed to follow-up with our clinic in 4 weeks. She was informed of the importance of frequent follow-up visits to maximize her success with intensive lifestyle modifications for her multiple health conditions.   Objective:   Blood pressure 114/68, pulse 65, temperature 97.7 F (36.5 C), height '5\' 3"'$  (1.6 m), weight 169 lb (76.7 kg), SpO2 97 %. Body mass index is 29.94 kg/m.  General: Cooperative, alert, well developed, in no acute distress. HEENT: Conjunctivae and lids unremarkable. Cardiovascular: Regular rhythm.  Lungs: Normal  work of breathing. Neurologic: No focal deficits.   Lab Results  Component Value Date   CREATININE 0.73 01/15/2021   BUN 19 01/15/2021   NA 141 01/15/2021   K 4.9 01/15/2021   CL 101 01/15/2021   CO2 25 01/15/2021   Lab Results  Component Value Date   ALT 20 01/15/2021   AST 21 01/15/2021   ALKPHOS 104 01/15/2021   BILITOT 0.6 01/15/2021   Lab Results  Component Value Date   HGBA1C 5.0 12/25/2020   HGBA1C 5.2 01/17/2020   HGBA1C 5.4 05/05/2019   HGBA1C 5.4 12/14/2018   HGBA1C 5.4 08/25/2018   Lab Results  Component Value Date   INSULIN 6.8 12/25/2020   INSULIN 4.7 01/17/2020   INSULIN 13.5 05/05/2019   INSULIN 12.8 12/14/2018   INSULIN 14.7 08/25/2018   Lab Results  Component Value Date   TSH 1.930 12/25/2020   Lab Results  Component Value Date   CHOL 229 (H) 12/25/2020   HDL 60 12/25/2020   LDLCALC 142 (H) 12/25/2020   LDLDIRECT 96.0 05/04/2018   TRIG 154 (H) 12/25/2020   CHOLHDL 3.8 12/25/2020   Lab Results  Component Value Date   VD25OH 50.6 12/25/2020   VD25OH 48.7 06/19/2020   VD25OH 48.0 01/17/2020   Lab Results  Component Value Date   WBC 4.3 12/25/2020   HGB 14.0 12/25/2020   HCT 41.0 12/25/2020   MCV 91 12/25/2020   PLT 355 12/25/2020   Lab Results  Component Value Date   IRON 110 11/01/2019   TIBC  382 11/01/2019    Obesity Behavioral Intervention:   Approximately 15 minutes were spent on the discussion below.  ASK: We discussed the diagnosis of obesity with Jennifer [name] today and Jennifer [name] agreed to give [place] permission to discuss obesity behavioral modification therapy today.  ASSESS: Jennifer [name] has the diagnosis of obesity and her BMI today is 30.0. Jennifer [name] is in the action stage of change.   ADVISE: Jennifer [name] was educated on the multiple health risks of obesity as well as the benefit of weight loss to improve her health. She was advised of the need for long term treatment and the importance of lifestyle modifications to improve her current  health and to decrease her risk of future health problems.  AGREE: Multiple dietary modification options and treatment options were discussed and Jennifer [name] agreed to follow the recommendations documented in the above note.  ARRANGE: Jennifer [name] was educated on the importance of frequent visits to treat obesity as outlined per CMS and USPSTF guidelines and agreed to schedule her next follow up appointment today.  Attestation Statements:   Reviewed by clinician on day of visit: allergies, medications, problem list, medical history, surgical history, family history, social history, and previous encounter notes.   I, Trixie Dredge, am acting as transcriptionist for Dennard Nip, MD.  I have reviewed the above documentation for accuracy and completeness, and I agree with the above. -  Dennard Nip, MD

## 2021-05-26 IMAGING — US US ABDOMEN LIMITED
1 series · 14 of 25 positions shown · non-contrast
Comparison: CT abdomen pelvis 08/29/2013

CLINICAL DATA: Elevated LFTs

EXAM:
ULTRASOUND ABDOMEN LIMITED RIGHT UPPER QUADRANT

[Series 1: us abdomen limited · 0.22mm/px · 14 of 33 slices shown]
[im 1/33]
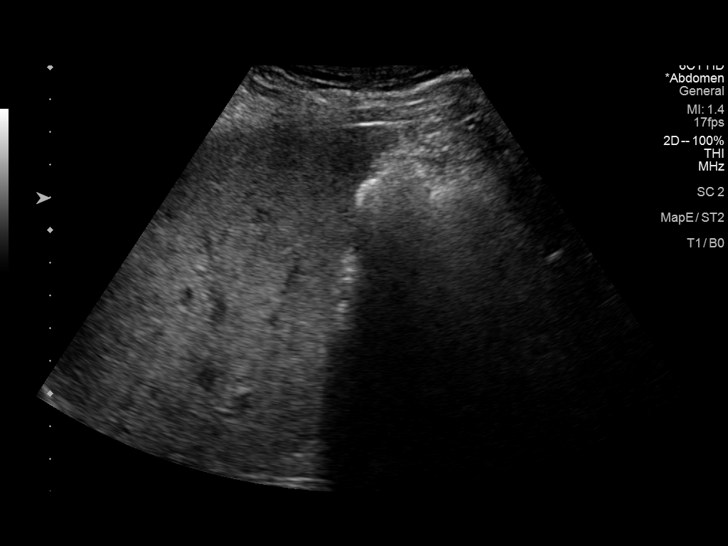
[im 3/33]
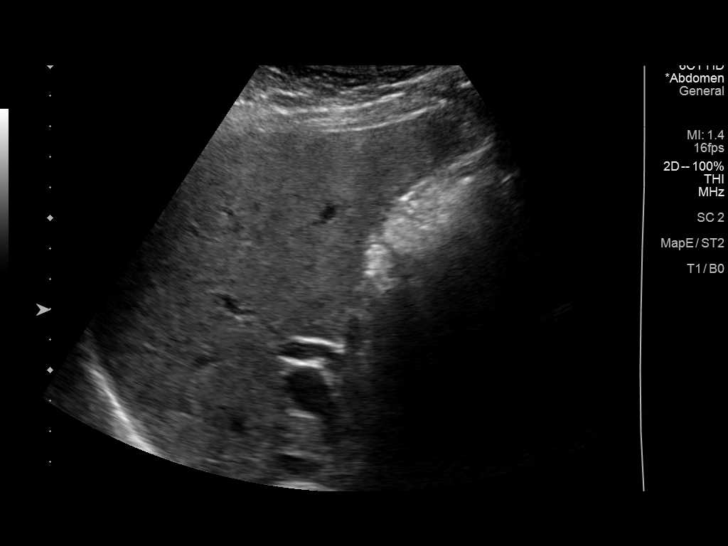
[im 6/33]
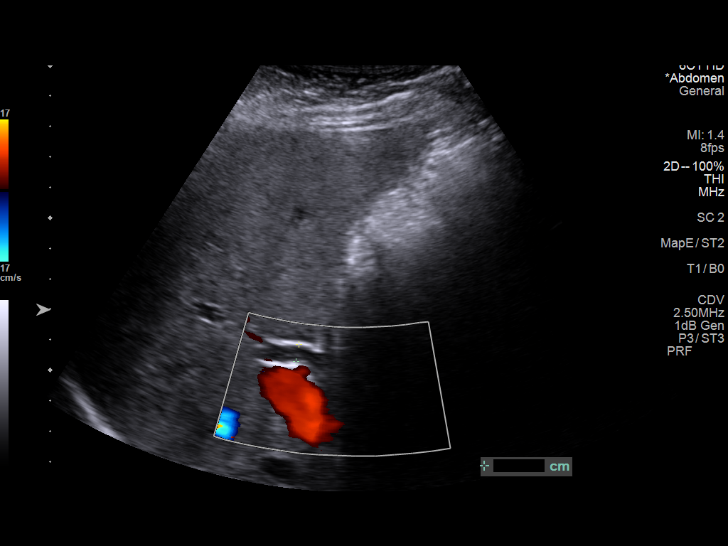
[im 9/33]
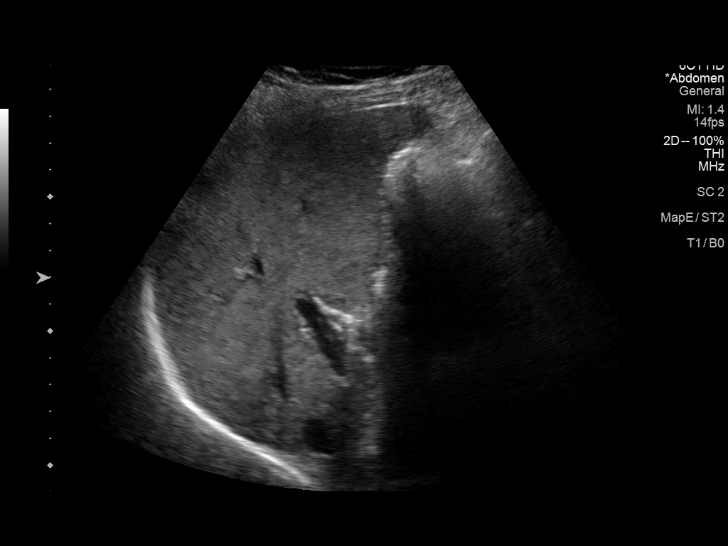
[im 11/33]
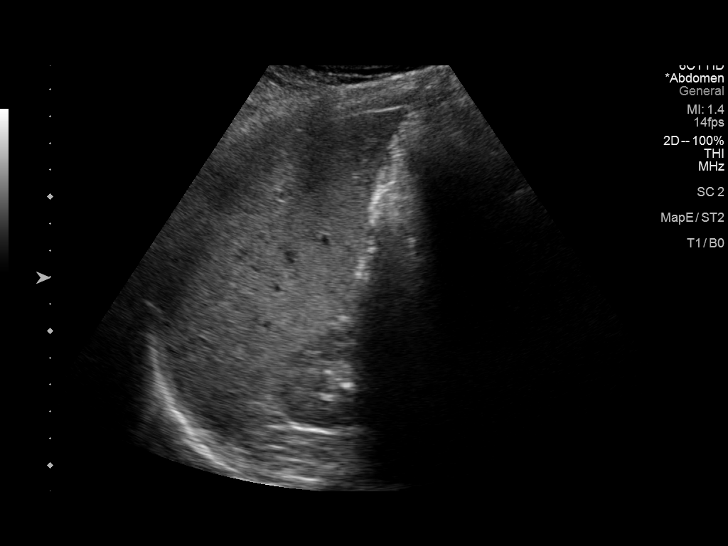
[im 13/33]
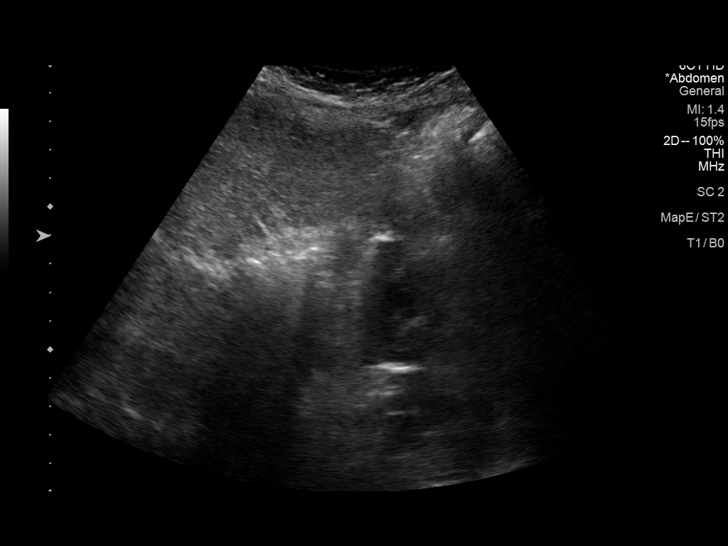
[im 15/33]
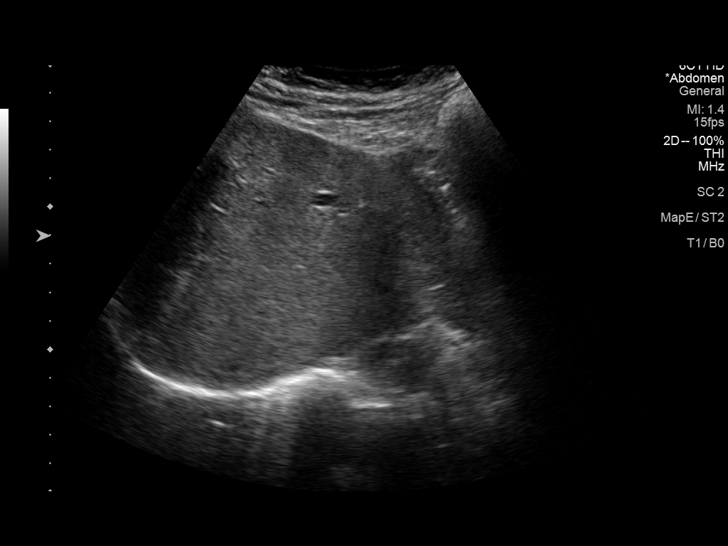
[im 18/33]
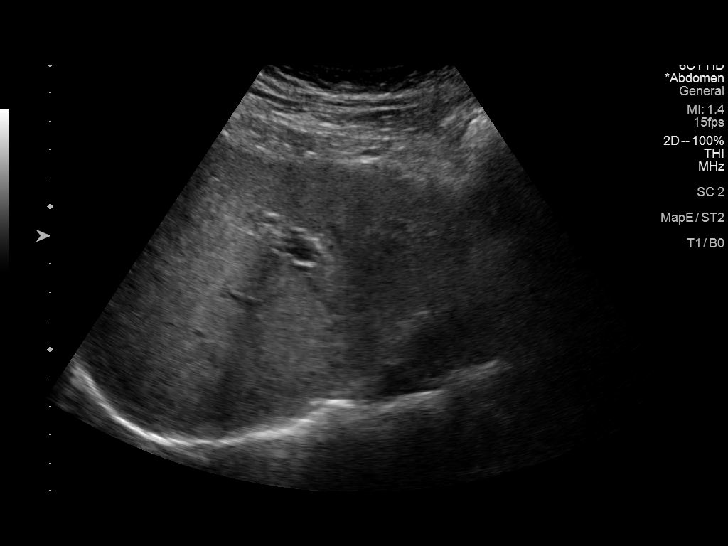
[im 21/33]
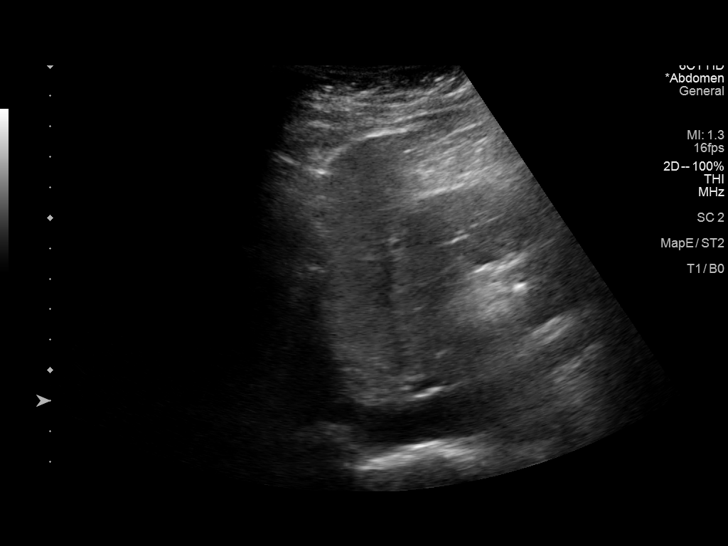
[im 22/33]
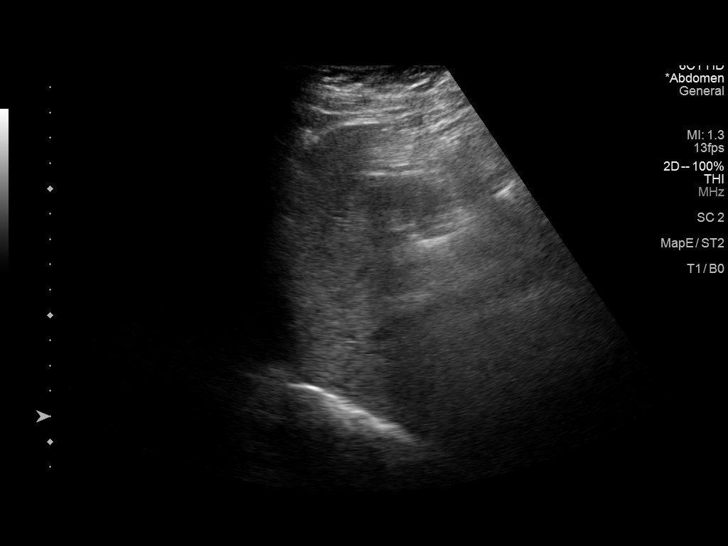
[im 25/33]
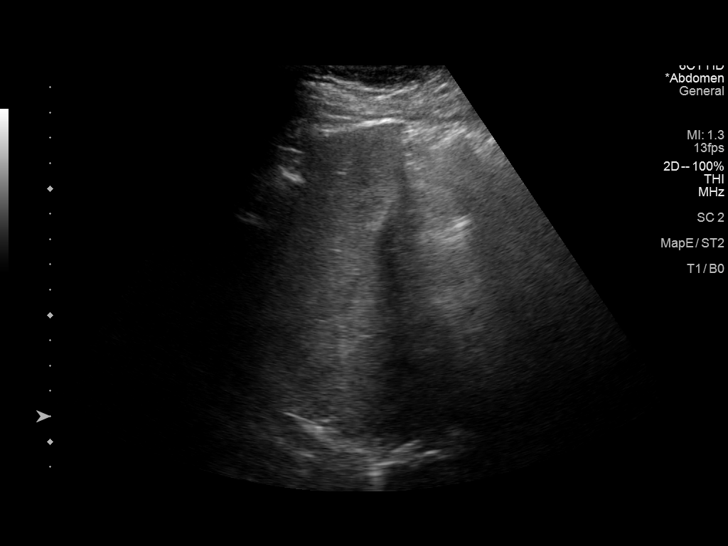
[im 27/33]
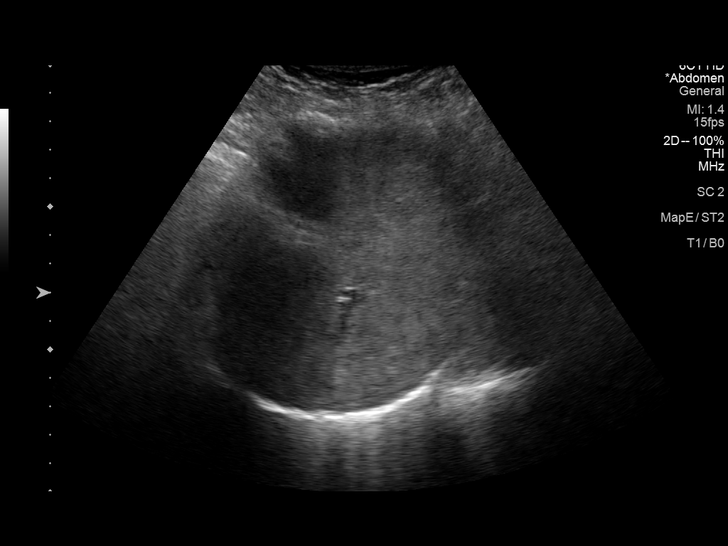
[im 30/33]
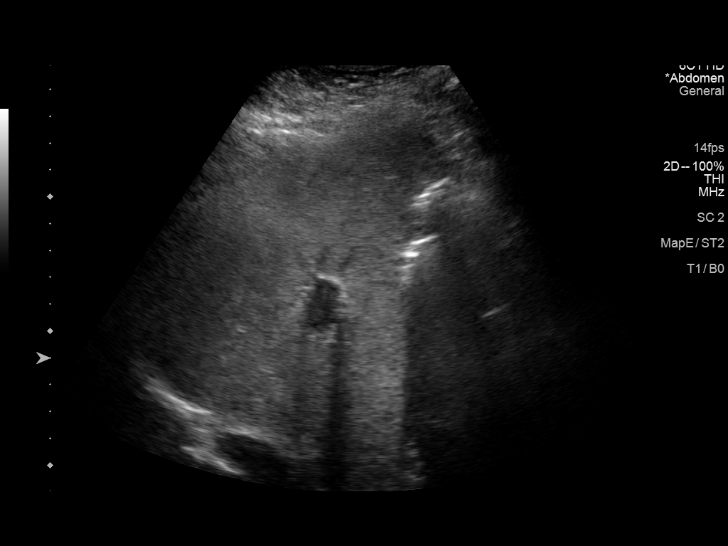
[im 33/33]
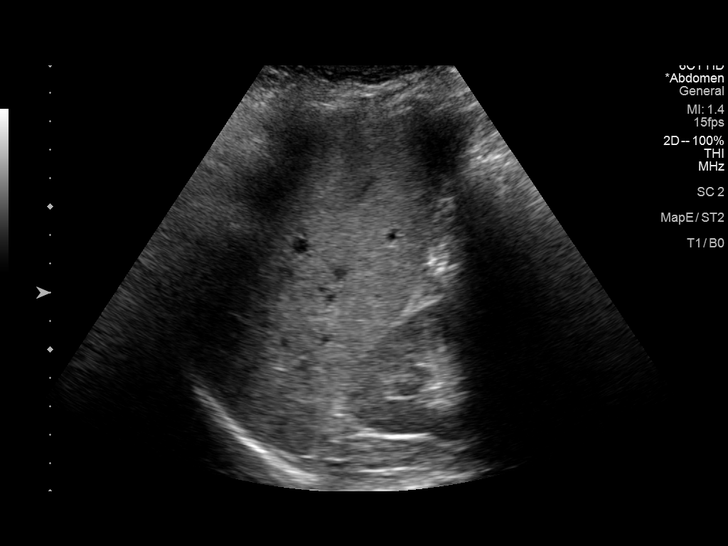

[14 of 25 positions shown; findings below may reference images not displayed]

FINDINGS: Gallbladder:

Surgically absent.

Common bile duct:

Diameter: 0.5 cm, within normal limits.

Liver:

No focal lesion identified. Parenchymal echogenicity appears
diffusely increased. Portal vein is patent on color Doppler imaging
with normal direction of blood flow towards the liver.

Other: None.
IMPRESSION: Diffusely increased liver parenchymal echogenicity which is a
nonspecific finding most commonly seen with hepatic steatosis.

## 2021-05-31 ENCOUNTER — Other Ambulatory Visit: Payer: Self-pay | Admitting: Family Medicine

## 2021-05-31 DIAGNOSIS — E119 Type 2 diabetes mellitus without complications: Secondary | ICD-10-CM

## 2021-05-31 DIAGNOSIS — I10 Essential (primary) hypertension: Secondary | ICD-10-CM

## 2021-05-31 DIAGNOSIS — F329 Major depressive disorder, single episode, unspecified: Secondary | ICD-10-CM

## 2021-06-06 ENCOUNTER — Ambulatory Visit (INDEPENDENT_AMBULATORY_CARE_PROVIDER_SITE_OTHER): Payer: Medicare Other | Admitting: Family Medicine

## 2021-06-06 ENCOUNTER — Encounter (INDEPENDENT_AMBULATORY_CARE_PROVIDER_SITE_OTHER): Payer: Self-pay | Admitting: Family Medicine

## 2021-06-06 VITALS — BP 117/73 | HR 62 | Temp 98.3°F | Ht 63.0 in | Wt 171.0 lb

## 2021-06-06 DIAGNOSIS — E782 Mixed hyperlipidemia: Secondary | ICD-10-CM | POA: Insufficient documentation

## 2021-06-06 DIAGNOSIS — E559 Vitamin D deficiency, unspecified: Secondary | ICD-10-CM | POA: Diagnosis not present

## 2021-06-06 DIAGNOSIS — E039 Hypothyroidism, unspecified: Secondary | ICD-10-CM

## 2021-06-06 DIAGNOSIS — E8881 Metabolic syndrome: Secondary | ICD-10-CM | POA: Diagnosis not present

## 2021-06-06 DIAGNOSIS — E669 Obesity, unspecified: Secondary | ICD-10-CM | POA: Diagnosis not present

## 2021-06-06 DIAGNOSIS — R739 Hyperglycemia, unspecified: Secondary | ICD-10-CM | POA: Diagnosis not present

## 2021-06-06 DIAGNOSIS — Z6830 Body mass index (BMI) 30.0-30.9, adult: Secondary | ICD-10-CM

## 2021-06-06 DIAGNOSIS — Z6836 Body mass index (BMI) 36.0-36.9, adult: Secondary | ICD-10-CM

## 2021-06-06 DIAGNOSIS — E88819 Insulin resistance, unspecified: Secondary | ICD-10-CM | POA: Insufficient documentation

## 2021-06-07 LAB — LIPID PANEL WITH LDL/HDL RATIO
Cholesterol, Total: 204 mg/dL — ABNORMAL HIGH (ref 100–199)
HDL: 56 mg/dL (ref >39)
LDL Chol Calc (NIH): 121 mg/dL — ABNORMAL HIGH (ref 0–99)
LDL/HDL Ratio: 2.2 ratio (ref 0.0–3.2)
Triglycerides: 155 mg/dL — ABNORMAL HIGH (ref 0–149)
VLDL Cholesterol Cal: 27 mg/dL (ref 5–40)

## 2021-06-07 LAB — CMP14+EGFR
ALT: 23 IU/L (ref 0–32)
AST: 23 IU/L (ref 0–40)
Albumin/Globulin Ratio: 1.9 (ref 1.2–2.2)
Albumin: 4.6 g/dL (ref 3.8–4.8)
Alkaline Phosphatase: 97 IU/L (ref 44–121)
BUN/Creatinine Ratio: 26 (ref 12–28)
BUN: 20 mg/dL (ref 8–27)
Bilirubin Total: 0.5 mg/dL (ref 0.0–1.2)
CO2: 22 mmol/L (ref 20–29)
Calcium: 9.9 mg/dL (ref 8.7–10.3)
Chloride: 101 mmol/L (ref 96–106)
Creatinine, Ser: 0.78 mg/dL (ref 0.57–1.00)
Globulin, Total: 2.4 g/dL (ref 1.5–4.5)
Glucose: 84 mg/dL (ref 70–99)
Potassium: 4.6 mmol/L (ref 3.5–5.2)
Sodium: 137 mmol/L (ref 134–144)
Total Protein: 7 g/dL (ref 6.0–8.5)
eGFR: 82 mL/min/{1.73_m2} (ref >59)

## 2021-06-07 LAB — HEMOGLOBIN A1C
Est. average glucose Bld gHb Est-mCnc: 103 mg/dL
Hgb A1c MFr Bld: 5.2 % (ref 4.8–5.6)

## 2021-06-07 LAB — T4, FREE: Free T4: 1.04 ng/dL (ref 0.82–1.77)

## 2021-06-07 LAB — VITAMIN D 25 HYDROXY (VIT D DEFICIENCY, FRACTURES): Vit D, 25-Hydroxy: 56 ng/mL (ref 30.0–100.0)

## 2021-06-07 LAB — INSULIN, RANDOM: INSULIN: 9.4 u[IU]/mL (ref 2.6–24.9)

## 2021-06-07 LAB — TSH: TSH: 3.42 u[IU]/mL (ref 0.450–4.500)

## 2021-06-07 LAB — T3: T3, Total: 71 ng/dL (ref 71–180)

## 2021-06-12 NOTE — Progress Notes (Signed)
Chief Complaint:   Jennifer [name] is here to discuss her progress with her Jennifer treatment plan along with follow-up of her Jennifer related diagnoses. Jennifer [name] is on the Category 1 Plan and states she is following her eating plan approximately 90% of the time. Jennifer [name] states she is exercising with paced walking dvd for 30 minutes 3-4 times per week.  Today's visit was #: 72 Starting weight: 208 lbs Starting date: 08/25/2018 Today's weight: 171 lbs Today's date: 06/06/2021 Total lbs lost to date: 37 Total lbs lost since last in-office visit: 0  Interim History: Jennifer [name] has had extra challenges in the last month. She is doing very well with cardio exercising, but she is not doing as much strengthening.   Subjective:   1. Mixed hyperlipidemia Jennifer [name] is working on her diet and exercise, and she is due for labs.   2. Vitamin D deficiency Jennifer [name] is on Vitamin D, and she is due for labs.   3. Insulin resistance Jennifer [name] is doing well with decreasing simple carbohydrates for the most part, and she is due for labs.   4. Acquired hypothyroidism Jennifer [name] is stable on Jennifer [name]. She has no signs of hyper or hypothyroidism.   Assessment/Plan:   1. Mixed hyperlipidemia Cardiovascular risk and specific lipid/LDL goals reviewed.  We discussed several lifestyle modifications today. We will check labs today. Shannon will continue to work on diet, exercise and weight loss efforts. Orders and follow up as documented in patient record.   - Lipid Panel With LDL/HDL Ratio  2. Vitamin D deficiency We will check labs today. Jennifer [name] will follow-up for routine testing of Vitamin D, at least 2-3 times per year to avoid over-replacement.  - VITAMIN D 25 Hydroxy (Vit-D Deficiency, Fractures)  3. Insulin resistance We will check labs today. Jennifer [name] will continue to work on weight loss, exercise, and decreasing simple carbohydrates to help decrease the risk of diabetes. Jennifer [name] agreed to follow-up  with Jennifer [name] as directed to closely monitor her progress.  - CMP14+EGFR - Insulin, random - Hemoglobin A1c  4. Acquired hypothyroidism We will check labs today. Orders and follow up as documented in patient record.  - TSH - T4, free - T3  5. Jennifer, Current BMI 30.0 Jennifer [name] is currently in the action stage of change. As such, her goal is to continue with weight loss efforts. She has agreed to the Category 1 Plan.   Exercise goals: Core strengthening exercises were discussed and demonstrated today.   Behavioral modification strategies: meal planning and cooking strategies and emotional eating strategies.  Jennifer [name] has agreed to follow-up with Jennifer [name] in 3 to 4 weeks. She was informed of the importance of frequent follow-up visits to maximize her success with intensive lifestyle modifications for her multiple health conditions.   Jennifer [name] was informed we would discuss her lab results at her next visit unless there is a critical issue that needs to be addressed sooner. Jennifer [name] agreed to keep her next visit at the agreed upon time to discuss these results.  Objective:   Blood pressure 117/73, pulse 62, temperature 98.3 F (36.8 C), height 5' 3" (1.6 m), weight 171 lb (77.6 kg), SpO2 96 %. Body mass index is 30.29 kg/m.  General: Cooperative, alert, well developed, in no acute distress. HEENT: Conjunctivae and lids unremarkable. Cardiovascular: Regular rhythm.  Lungs: Normal work of breathing. Neurologic: No focal deficits.   Lab Results  Component Value Date   CREATININE 0.78 06/06/2021   BUN 20 06/06/2021   NA 137  06/06/2021   K 4.6 06/06/2021   CL 101 06/06/2021   CO2 22 06/06/2021   Lab Results  Component Value Date   ALT 23 06/06/2021   AST 23 06/06/2021   ALKPHOS 97 06/06/2021   BILITOT 0.5 06/06/2021   Lab Results  Component Value Date   HGBA1C 5.2 06/06/2021   HGBA1C 5.0 12/25/2020   HGBA1C 5.2 01/17/2020   HGBA1C 5.4 05/05/2019   HGBA1C 5.4 12/14/2018    Lab Results  Component Value Date   INSULIN 9.4 06/06/2021   INSULIN 6.8 12/25/2020   INSULIN 4.7 01/17/2020   INSULIN 13.5 05/05/2019   INSULIN 12.8 12/14/2018   Lab Results  Component Value Date   TSH 3.420 06/06/2021   Lab Results  Component Value Date   CHOL 204 (H) 06/06/2021   HDL 56 06/06/2021   LDLCALC 121 (H) 06/06/2021   LDLDIRECT 96.0 05/04/2018   TRIG 155 (H) 06/06/2021   CHOLHDL 3.8 12/25/2020   Lab Results  Component Value Date   VD25OH 56.0 06/06/2021   VD25OH 50.6 12/25/2020   VD25OH 48.7 06/19/2020   Lab Results  Component Value Date   WBC 4.3 12/25/2020   HGB 14.0 12/25/2020   HCT 41.0 12/25/2020   MCV 91 12/25/2020   PLT 355 12/25/2020   Lab Results  Component Value Date   IRON 110 11/01/2019   TIBC 382 11/01/2019    Jennifer Behavioral Intervention:   Approximately 15 minutes were spent on the discussion below.  ASK: We discussed the diagnosis of Jennifer with Jennifer [name] today and Jennifer [name] agreed to give Jennifer [name] permission to discuss Jennifer behavioral modification therapy today.  ASSESS: Jennifer [name] has the diagnosis of Jennifer and her BMI today is 30.3. Jennifer [name] is in the action stage of change.   ADVISE: Jennifer [name] was educated on the multiple health risks of Jennifer as well as the benefit of weight loss to improve her health. She was advised of the need for long term treatment and the importance of lifestyle modifications to improve her current health and to decrease her risk of future health problems.  AGREE: Multiple dietary modification options and treatment options were discussed and Jennifer [name] agreed to follow the recommendations documented in the above note.  ARRANGE: Jennifer [name] was educated on the importance of frequent visits to treat Jennifer as outlined per CMS and USPSTF guidelines and agreed to schedule her next follow up appointment today.  Attestation Statements:   Reviewed by clinician on day of visit: allergies, medications, problem  list, medical history, surgical history, family history, social history, and previous encounter notes.   I, Trixie Dredge, am acting as transcriptionist for Dennard Nip, MD.  I have reviewed the above documentation for accuracy and completeness, and I agree with the above. -  Dennard Nip, MD

## 2021-07-02 ENCOUNTER — Encounter (INDEPENDENT_AMBULATORY_CARE_PROVIDER_SITE_OTHER): Payer: Self-pay

## 2021-07-02 ENCOUNTER — Ambulatory Visit (INDEPENDENT_AMBULATORY_CARE_PROVIDER_SITE_OTHER): Payer: Medicare Other | Admitting: Family Medicine

## 2021-07-03 DIAGNOSIS — Z96651 Presence of right artificial knee joint: Secondary | ICD-10-CM | POA: Diagnosis not present

## 2021-07-03 DIAGNOSIS — M25552 Pain in left hip: Secondary | ICD-10-CM | POA: Diagnosis not present

## 2021-07-03 DIAGNOSIS — Z96652 Presence of left artificial knee joint: Secondary | ICD-10-CM | POA: Diagnosis not present

## 2021-07-03 DIAGNOSIS — Z96641 Presence of right artificial hip joint: Secondary | ICD-10-CM | POA: Diagnosis not present

## 2021-07-30 ENCOUNTER — Encounter (INDEPENDENT_AMBULATORY_CARE_PROVIDER_SITE_OTHER): Payer: Self-pay | Admitting: Family Medicine

## 2021-07-30 ENCOUNTER — Ambulatory Visit (INDEPENDENT_AMBULATORY_CARE_PROVIDER_SITE_OTHER): Payer: Medicare Other | Admitting: Family Medicine

## 2021-07-30 VITALS — BP 131/60 | HR 105 | Temp 98.0°F | Ht 63.0 in | Wt 166.0 lb

## 2021-07-30 DIAGNOSIS — E782 Mixed hyperlipidemia: Secondary | ICD-10-CM

## 2021-07-30 DIAGNOSIS — Z6829 Body mass index (BMI) 29.0-29.9, adult: Secondary | ICD-10-CM | POA: Diagnosis not present

## 2021-07-30 DIAGNOSIS — E669 Obesity, unspecified: Secondary | ICD-10-CM

## 2021-07-30 DIAGNOSIS — E8881 Metabolic syndrome: Secondary | ICD-10-CM | POA: Diagnosis not present

## 2021-08-01 DIAGNOSIS — M25552 Pain in left hip: Secondary | ICD-10-CM | POA: Diagnosis not present

## 2021-08-01 DIAGNOSIS — S76011D Strain of muscle, fascia and tendon of right hip, subsequent encounter: Secondary | ICD-10-CM | POA: Diagnosis not present

## 2021-08-06 NOTE — Progress Notes (Unsigned)
Chief Complaint:   OBESITY Jennifer [name] is here to discuss her progress with her obesity treatment plan along with follow-up of her obesity related diagnoses. Jennifer [name] is on the Category 1 Plan and states she is following her eating plan approximately 50% of the time. Jennifer [name] states she is doing 0 minutes 0 times per week.  Today's visit was #: 28 Starting weight: 208 lbs Starting date: 08/25/2018 Today's weight: 166 lbs Today's date: 07/30/2021 Total lbs lost to date: 42 Total lbs lost since last in-office visit: 5  Interim History: Jennifer [name] continues to do well with weight loss.  She has had some issues with pain and was tempted to do more comfort eating.  She has done well with minimizing this and trying to increase protein and vegetables.  Subjective:   1. Mixed hyperlipidemia Jennifer [name]'s triglycerides are slightly elevated and LDL has improved but is still not yet at goal.  She denies chest pain, and she is working on her diet and weight loss.  I discussed labs with the patient today.  2. Insulin resistance Jennifer [name]'s A1c and glucose are well controlled, and she is working on her diet.  I discussed labs with the patient today.  Assessment/Plan:   1. Mixed hyperlipidemia Cardiovascular risk and specific lipid/LDL goals reviewed. Jennifer [name] will continue to work on diet, exercise and weight loss efforts. Orders and follow up as documented in patient record.   2. Insulin resistance Jennifer [name] will continue with her diet, exercise, and weight loss to help decrease the risk of diabetes. Jennifer [name] agreed to follow-up with [place] as directed to closely monitor her progress.  3. Obesity, Current BMI 29.5 Jennifer [name] is currently in the action stage of change. As such, her goal is to continue with weight loss efforts. She has agreed to the Category 1 Plan.   Behavioral modification strategies: increasing water intake.  Jennifer [name] has agreed to follow-up with our clinic in 3 to 4 weeks. She was informed of  the importance of frequent follow-up visits to maximize her success with intensive lifestyle modifications for her multiple health conditions.   Objective:   Blood pressure 131/60, pulse (!) 105, temperature 98 F (36.7 C), height '5\' 3"'$  (1.6 m), weight 166 lb (75.3 kg), SpO2 98 %. Body mass index is 29.41 kg/m.  General: Cooperative, alert, well developed, in no acute distress. HEENT: Conjunctivae and lids unremarkable. Cardiovascular: Regular rhythm.  Lungs: Normal work of breathing. Neurologic: No focal deficits.   Lab Results  Component Value Date   CREATININE 0.78 06/06/2021   BUN 20 06/06/2021   NA 137 06/06/2021   K 4.6 06/06/2021   CL 101 06/06/2021   CO2 22 06/06/2021   Lab Results  Component Value Date   ALT 23 06/06/2021   AST 23 06/06/2021   ALKPHOS 97 06/06/2021   BILITOT 0.5 06/06/2021   Lab Results  Component Value Date   HGBA1C 5.2 06/06/2021   HGBA1C 5.0 12/25/2020   HGBA1C 5.2 01/17/2020   HGBA1C 5.4 05/05/2019   HGBA1C 5.4 12/14/2018   Lab Results  Component Value Date   INSULIN 9.4 06/06/2021   INSULIN 6.8 12/25/2020   INSULIN 4.7 01/17/2020   INSULIN 13.5 05/05/2019   INSULIN 12.8 12/14/2018   Lab Results  Component Value Date   TSH 3.420 06/06/2021   Lab Results  Component Value Date   CHOL 204 (H) 06/06/2021   HDL 56 06/06/2021   LDLCALC 121 (H) 06/06/2021   LDLDIRECT 96.0 05/04/2018   TRIG 155 (H)  06/06/2021   CHOLHDL 3.8 12/25/2020   Lab Results  Component Value Date   VD25OH 56.0 06/06/2021   VD25OH 50.6 12/25/2020   VD25OH 48.7 06/19/2020   Lab Results  Component Value Date   WBC 4.3 12/25/2020   HGB 14.0 12/25/2020   HCT 41.0 12/25/2020   MCV 91 12/25/2020   PLT 355 12/25/2020   Lab Results  Component Value Date   IRON 110 11/01/2019   TIBC 382 11/01/2019   Attestation Statements:   Reviewed by clinician on day of visit: allergies, medications, problem list, medical history, surgical history, family history,  social history, and previous encounter notes.  Time spent on visit including pre-visit chart review and post-visit care and charting was 32 minutes.   I, Trixie Dredge, am acting as transcriptionist for Dennard Nip, MD.  I have reviewed the above documentation for accuracy and completeness, and I agree with the above. -  ***

## 2021-08-09 DIAGNOSIS — M25552 Pain in left hip: Secondary | ICD-10-CM | POA: Diagnosis not present

## 2021-08-09 DIAGNOSIS — S76011D Strain of muscle, fascia and tendon of right hip, subsequent encounter: Secondary | ICD-10-CM | POA: Diagnosis not present

## 2021-08-10 ENCOUNTER — Other Ambulatory Visit: Payer: Self-pay | Admitting: Family Medicine

## 2021-08-10 DIAGNOSIS — E039 Hypothyroidism, unspecified: Secondary | ICD-10-CM

## 2021-08-15 ENCOUNTER — Encounter (INDEPENDENT_AMBULATORY_CARE_PROVIDER_SITE_OTHER): Payer: Self-pay

## 2021-08-16 DIAGNOSIS — M25552 Pain in left hip: Secondary | ICD-10-CM | POA: Diagnosis not present

## 2021-08-16 DIAGNOSIS — S76011D Strain of muscle, fascia and tendon of right hip, subsequent encounter: Secondary | ICD-10-CM | POA: Diagnosis not present

## 2021-08-23 DIAGNOSIS — S76011D Strain of muscle, fascia and tendon of right hip, subsequent encounter: Secondary | ICD-10-CM | POA: Diagnosis not present

## 2021-08-23 DIAGNOSIS — M25552 Pain in left hip: Secondary | ICD-10-CM | POA: Diagnosis not present

## 2021-08-27 ENCOUNTER — Encounter (INDEPENDENT_AMBULATORY_CARE_PROVIDER_SITE_OTHER): Payer: Self-pay | Admitting: Family Medicine

## 2021-08-27 ENCOUNTER — Ambulatory Visit (INDEPENDENT_AMBULATORY_CARE_PROVIDER_SITE_OTHER): Payer: Medicare Other | Admitting: Family Medicine

## 2021-08-27 VITALS — BP 137/68 | HR 70 | Temp 97.9°F | Ht 63.0 in | Wt 170.0 lb

## 2021-08-27 DIAGNOSIS — E669 Obesity, unspecified: Secondary | ICD-10-CM

## 2021-08-27 DIAGNOSIS — Z6830 Body mass index (BMI) 30.0-30.9, adult: Secondary | ICD-10-CM | POA: Diagnosis not present

## 2021-08-27 DIAGNOSIS — I10 Essential (primary) hypertension: Secondary | ICD-10-CM

## 2021-08-28 DIAGNOSIS — S76011D Strain of muscle, fascia and tendon of right hip, subsequent encounter: Secondary | ICD-10-CM | POA: Diagnosis not present

## 2021-08-28 DIAGNOSIS — M25552 Pain in left hip: Secondary | ICD-10-CM | POA: Diagnosis not present

## 2021-09-03 NOTE — Progress Notes (Unsigned)
Chief Complaint:   OBESITY Jennifer [name] is here to discuss her progress with her obesity treatment plan along with follow-up of her obesity related diagnoses. Jennifer [name] is on the Category 1 Plan and states she is following her eating plan approximately 70% of the time. Jennifer [name] states she is at [employer], doing physical therapy and at the [facility] 4 times per week.    Today's visit was #: 39 Starting weight: 208 lbs Starting date: 08/25/2018 Today's weight: 170 lbs Today's date: 08/27/2021 Total lbs lost to date: 38 Total lbs lost since last in-office visit: 0  Interim History: Jennifer [name] has been trying to portion control but she notes some increase in fat. She is exercising at home and with physical therapy, and she is doing a nice combinations of cardio and strengthening. She will be traveling soon. Much of her weight gain is increased water.   Subjective:   1. Essential hypertension Jennifer [name]'s blood pressure is stable on her medications. She is working on her diet and exercise, and she is trying to decrease her sodium. She has no signs of hypotension.   Assessment/Plan:   1. Essential hypertension Jennifer [name] will continue to increase her water intake, and will will continue with her diet, exercise, and weight loss.   2. Obesity, Current BMI 30.1 Jennifer [name] is currently in the action stage of change. As such, her goal is to continue with weight loss efforts. She has agreed to the Category 1 Plan.   Exercise goals: As is.   Behavioral modification strategies: increasing lean protein intake and travel eating strategies.  Jennifer [name] has agreed to follow-up with our clinic in 3 to 4 weeks. She was informed of the importance of frequent follow-up visits to maximize her success with intensive lifestyle modifications for her multiple health conditions.   Objective:   Blood pressure 137/68, pulse 70, temperature 97.9 F (36.6 C), height '5\' 3"'$  (1.6 m), weight 170 lb (77.1 kg), SpO2 94 %. Body mass index  is 30.11 kg/m.  General: Cooperative, alert, well developed, in no acute distress. HEENT: Conjunctivae and lids unremarkable. Cardiovascular: Regular rhythm.  Lungs: Normal work of breathing. Neurologic: No focal deficits.   Lab Results  Component Value Date   CREATININE 0.78 06/06/2021   BUN 20 06/06/2021   NA 137 06/06/2021   K 4.6 06/06/2021   CL 101 06/06/2021   CO2 22 06/06/2021   Lab Results  Component Value Date   ALT 23 06/06/2021   AST 23 06/06/2021   ALKPHOS 97 06/06/2021   BILITOT 0.5 06/06/2021   Lab Results  Component Value Date   HGBA1C 5.2 06/06/2021   HGBA1C 5.0 12/25/2020   HGBA1C 5.2 01/17/2020   HGBA1C 5.4 05/05/2019   HGBA1C 5.4 12/14/2018   Lab Results  Component Value Date   INSULIN 9.4 06/06/2021   INSULIN 6.8 12/25/2020   INSULIN 4.7 01/17/2020   INSULIN 13.5 05/05/2019   INSULIN 12.8 12/14/2018   Lab Results  Component Value Date   TSH 3.420 06/06/2021   Lab Results  Component Value Date   CHOL 204 (H) 06/06/2021   HDL 56 06/06/2021   LDLCALC 121 (H) 06/06/2021   LDLDIRECT 96.0 05/04/2018   TRIG 155 (H) 06/06/2021   CHOLHDL 3.8 12/25/2020   Lab Results  Component Value Date   VD25OH 56.0 06/06/2021   VD25OH 50.6 12/25/2020   VD25OH 48.7 06/19/2020   Lab Results  Component Value Date   WBC 4.3 12/25/2020   HGB 14.0 12/25/2020  HCT 41.0 12/25/2020   MCV 91 12/25/2020   PLT 355 12/25/2020   Lab Results  Component Value Date   IRON 110 11/01/2019   TIBC 382 11/01/2019   Attestation Statements:   Reviewed by clinician on day of visit: allergies, medications, problem list, medical history, surgical history, family history, social history, and previous encounter notes.  Time spent on visit including pre-visit chart review and post-visit care and charting was 30 minutes.   I, Trixie Dredge, am acting as transcriptionist for Dennard Nip, MD.  I have reviewed the above documentation for accuracy and completeness, and I  agree with the above. -  Dennard Nip, MD

## 2021-09-04 DIAGNOSIS — Z13820 Encounter for screening for osteoporosis: Secondary | ICD-10-CM | POA: Diagnosis not present

## 2021-09-04 DIAGNOSIS — Z1231 Encounter for screening mammogram for malignant neoplasm of breast: Secondary | ICD-10-CM | POA: Diagnosis not present

## 2021-09-04 LAB — HM MAMMOGRAPHY

## 2021-09-06 ENCOUNTER — Encounter: Payer: Self-pay | Admitting: Family Medicine

## 2021-09-06 DIAGNOSIS — M25552 Pain in left hip: Secondary | ICD-10-CM | POA: Diagnosis not present

## 2021-09-06 DIAGNOSIS — S76011D Strain of muscle, fascia and tendon of right hip, subsequent encounter: Secondary | ICD-10-CM | POA: Diagnosis not present

## 2021-09-07 ENCOUNTER — Ambulatory Visit (INDEPENDENT_AMBULATORY_CARE_PROVIDER_SITE_OTHER): Payer: Medicare Other | Admitting: Neurology

## 2021-09-07 ENCOUNTER — Encounter: Payer: Self-pay | Admitting: Neurology

## 2021-09-07 VITALS — BP 132/71 | HR 84 | Resp 18 | Ht 63.0 in | Wt 177.0 lb

## 2021-09-07 DIAGNOSIS — G40009 Localization-related (focal) (partial) idiopathic epilepsy and epileptic syndromes with seizures of localized onset, not intractable, without status epilepticus: Secondary | ICD-10-CM | POA: Diagnosis not present

## 2021-09-07 DIAGNOSIS — R29898 Other symptoms and signs involving the musculoskeletal system: Secondary | ICD-10-CM

## 2021-09-07 MED ORDER — LEVETIRACETAM 500 MG PO TABS: 500.0000 mg | ORAL_TABLET | Freq: Two times a day (BID) | ORAL | 3 refills | Status: DC
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
## 2021-09-11 ENCOUNTER — Encounter: Payer: Self-pay | Admitting: Family Medicine

## 2021-09-13 DIAGNOSIS — S76011D Strain of muscle, fascia and tendon of right hip, subsequent encounter: Secondary | ICD-10-CM | POA: Diagnosis not present

## 2021-09-13 DIAGNOSIS — M25552 Pain in left hip: Secondary | ICD-10-CM | POA: Diagnosis not present

## 2021-10-01 ENCOUNTER — Encounter (INDEPENDENT_AMBULATORY_CARE_PROVIDER_SITE_OTHER): Payer: Self-pay | Admitting: Family Medicine

## 2021-10-01 ENCOUNTER — Ambulatory Visit (INDEPENDENT_AMBULATORY_CARE_PROVIDER_SITE_OTHER): Payer: Medicare Other | Admitting: Family Medicine

## 2021-10-01 VITALS — BP 118/75 | HR 78 | Temp 98.4°F | Ht 63.0 in | Wt 170.0 lb

## 2021-10-01 DIAGNOSIS — E669 Obesity, unspecified: Secondary | ICD-10-CM | POA: Diagnosis not present

## 2021-10-01 DIAGNOSIS — M25551 Pain in right hip: Secondary | ICD-10-CM

## 2021-10-01 DIAGNOSIS — E66812 Obesity, class 2: Secondary | ICD-10-CM

## 2021-10-01 DIAGNOSIS — Z6830 Body mass index (BMI) 30.0-30.9, adult: Secondary | ICD-10-CM | POA: Diagnosis not present

## 2021-10-01 DIAGNOSIS — S76011D Strain of muscle, fascia and tendon of right hip, subsequent encounter: Secondary | ICD-10-CM | POA: Diagnosis not present

## 2021-10-01 DIAGNOSIS — M25552 Pain in left hip: Secondary | ICD-10-CM | POA: Diagnosis not present

## 2021-10-03 NOTE — Progress Notes (Unsigned)
Chief Complaint:   OBESITY Jennifer [name] is here to discuss her progress with her obesity treatment plan along with follow-up of her obesity related diagnoses. Jennifer [name] is on the Category 1 Plan and states she is following her eating plan approximately 50% of the time. Jennifer [name] states she is doing 0 minutes 0 times per week.  Today's visit was #: 24 Starting weight: 208 lbs Starting date: 08/25/2018 Today's weight: 170 lbs Today's date: 10/01/2021 Total lbs lost to date: 38 Total lbs lost since last in-office visit: 0  Interim History: Jennifer [name] has done well with maintaining her weight. She is working part time now at [employer]. She has been on vacation and she did some celebration eating, but she was able to portion control and avoid weight gain. She is in physical therapy for her hip and she is working on increasing walking.   Subjective:   1. 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 She hasn't been able to exercise as much. She is starting to feel better.   Assessment/Plan:   1. Pain of right hip Jennifer [name] is to start exercising slowly, and she will continue physical therapy. We will continue to follow.   2. Obesity, Current BMI 30.1 Jennifer [name] is currently in the action stage of change. As such, her goal is to continue with weight loss efforts. She has agreed to the Category 1 Plan.   Behavioral modification strategies: increasing lean protein intake and no skipping meals.  Jennifer [name] has agreed to follow-up with our clinic in 4 weeks. She was informed of the importance of frequent follow-up visits to maximize her success with intensive lifestyle modifications for her multiple health conditions.   Objective:   Blood pressure 118/75, pulse 78, temperature 98.4 F (36.9 C), height '5\' 3"'$  (1.6 m), weight 170 lb (77.1 kg), SpO2 97 %. Body mass index is 30.11 kg/m.  General: Cooperative, alert, well developed, in no acute distress. HEENT: Conjunctivae and lids  unremarkable. Cardiovascular: Regular rhythm.  Lungs: Normal work of breathing. Neurologic: No focal deficits.   Lab Results  Component Value Date   CREATININE 0.78 06/06/2021   BUN 20 06/06/2021   NA 137 06/06/2021   K 4.6 06/06/2021   CL 101 06/06/2021   CO2 22 06/06/2021   Lab Results  Component Value Date   ALT 23 06/06/2021   AST 23 06/06/2021   ALKPHOS 97 06/06/2021   BILITOT 0.5 06/06/2021   Lab Results  Component Value Date   HGBA1C 5.2 06/06/2021   HGBA1C 5.0 12/25/2020   HGBA1C 5.2 01/17/2020   HGBA1C 5.4 05/05/2019   HGBA1C 5.4 12/14/2018   Lab Results  Component Value Date   INSULIN 9.4 06/06/2021   INSULIN 6.8 12/25/2020   INSULIN 4.7 01/17/2020   INSULIN 13.5 05/05/2019   INSULIN 12.8 12/14/2018   Lab Results  Component Value Date   TSH 3.420 06/06/2021   Lab Results  Component Value Date   CHOL 204 (H) 06/06/2021   HDL 56 06/06/2021   LDLCALC 121 (H) 06/06/2021   LDLDIRECT 96.0 05/04/2018   TRIG 155 (H) 06/06/2021   CHOLHDL 3.8 12/25/2020   Lab Results  Component Value Date   VD25OH 56.0 06/06/2021   VD25OH 50.6 12/25/2020   VD25OH 48.7 06/19/2020   Lab Results  Component Value Date   WBC 4.3 12/25/2020   HGB 14.0 12/25/2020   HCT 41.0 12/25/2020   MCV 91 12/25/2020   PLT 355 12/25/2020   Lab Results  Component Value Date   IRON 110 11/01/2019   TIBC 382 11/01/2019   Attestation Statements:   Reviewed by clinician on day of visit: allergies, medications, problem list, medical history, surgical history, family history, social history, and previous encounter notes.  Time spent on visit including pre-visit chart review and post-visit care and charting was 30 minutes.   I, Trixie Dredge, am acting as transcriptionist for Dennard Nip, MD.  I have reviewed the above documentation for accuracy and completeness, and I agree with the above. -  Dennard Nip, MD

## 2021-10-09 DIAGNOSIS — S76011D Strain of muscle, fascia and tendon of right hip, subsequent encounter: Secondary | ICD-10-CM | POA: Diagnosis not present

## 2021-10-09 DIAGNOSIS — M25552 Pain in left hip: Secondary | ICD-10-CM | POA: Diagnosis not present

## 2021-10-19 ENCOUNTER — Telehealth: Payer: Self-pay | Admitting: Family Medicine

## 2021-10-19 NOTE — Telephone Encounter (Signed)
Left message for patient to call back and schedule Medicare Annual Wellness Visit (AWV).   Please offer to do virtually or by telephone.  Left office number and my jabber [phone].  Last AWV:10/12/2020  Please schedule at anytime with Nurse Health Advisor.

## 2021-10-28 DIAGNOSIS — Z23 Encounter for immunization: Secondary | ICD-10-CM | POA: Diagnosis not present

## 2021-10-29 ENCOUNTER — Ambulatory Visit (INDEPENDENT_AMBULATORY_CARE_PROVIDER_SITE_OTHER): Payer: Medicare Other | Admitting: Family Medicine

## 2021-10-29 ENCOUNTER — Encounter (INDEPENDENT_AMBULATORY_CARE_PROVIDER_SITE_OTHER): Payer: Self-pay | Admitting: Family Medicine

## 2021-10-29 VITALS — BP 158/71 | HR 64 | Temp 98.6°F | Ht 63.0 in | Wt 170.0 lb

## 2021-10-29 DIAGNOSIS — E669 Obesity, unspecified: Secondary | ICD-10-CM

## 2021-10-29 DIAGNOSIS — Z6827 Body mass index (BMI) 27.0-27.9, adult: Secondary | ICD-10-CM

## 2021-10-29 DIAGNOSIS — I10 Essential (primary) hypertension: Secondary | ICD-10-CM | POA: Diagnosis not present

## 2021-11-04 NOTE — Progress Notes (Unsigned)
Chief Complaint:   OBESITY Jennifer [name] is here to discuss her progress with her obesity treatment plan along with follow-up of her obesity related diagnoses. Jennifer [name] is on the Category 1 Plan and states she is following her eating plan approximately 50% of the time. Jennifer [name] states she is doing 0 minutes 0 times per week.  Today's visit was #: 49 Starting weight: 208 lbs Starting date: 08/25/2018 Today's weight: 170 lbs Today's date: 10/29/2021 Total lbs lost to date: 38 Total lbs lost since last in-office visit: 0  Interim History: Jennifer [name] is dealing with left leg pain and she is doing physical therapy, but she is unable to exercise. She notes giving into more Halloween candy temptations. She is working eating all her protein.   Subjective:   1. Essential hypertension Jennifer [name] feels her blood pressure is elevated due to increased leg pain. Her blood pressure is normally well controlled.   Assessment/Plan:   1. Essential hypertension Jennifer [name] is to continue with her diet and weight loss, and we will recheck labs in 1 month.   2. Obesity, Current BMI 21.3 Jennifer [name] is currently in the action stage of change. As such, her goal is to continue with weight loss efforts. She has agreed to the Category 1 Plan.   Behavioral modification strategies: increasing lean protein intake, better snacking choices, and celebration eating strategies.  Jennifer [name] has agreed to follow-up with our clinic in 3 to 4 weeks. She was informed of the importance of frequent follow-up visits to maximize her success with intensive lifestyle modifications for her multiple health conditions.   Objective:   Blood pressure (!) 158/71, pulse 64, temperature 98.6 F (37 C), height '5\' 3"'$  (1.6 m), weight 170 lb (77.1 kg), SpO2 96 %. Body mass index is 30.11 kg/m.  General: Cooperative, alert, well developed, in no acute distress. HEENT: Conjunctivae and lids unremarkable. Cardiovascular: Regular rhythm.  Lungs: Normal  work of breathing. Neurologic: No focal deficits.   Lab Results  Component Value Date   CREATININE 0.78 06/06/2021   BUN 20 06/06/2021   NA 137 06/06/2021   K 4.6 06/06/2021   CL 101 06/06/2021   CO2 22 06/06/2021   Lab Results  Component Value Date   ALT 23 06/06/2021   AST 23 06/06/2021   ALKPHOS 97 06/06/2021   BILITOT 0.5 06/06/2021   Lab Results  Component Value Date   HGBA1C 5.2 06/06/2021   HGBA1C 5.0 12/25/2020   HGBA1C 5.2 01/17/2020   HGBA1C 5.4 05/05/2019   HGBA1C 5.4 12/14/2018   Lab Results  Component Value Date   INSULIN 9.4 06/06/2021   INSULIN 6.8 12/25/2020   INSULIN 4.7 01/17/2020   INSULIN 13.5 05/05/2019   INSULIN 12.8 12/14/2018   Lab Results  Component Value Date   TSH 3.420 06/06/2021   Lab Results  Component Value Date   CHOL 204 (H) 06/06/2021   HDL 56 06/06/2021   LDLCALC 121 (H) 06/06/2021   LDLDIRECT 96.0 05/04/2018   TRIG 155 (H) 06/06/2021   CHOLHDL 3.8 12/25/2020   Lab Results  Component Value Date   VD25OH 56.0 06/06/2021   VD25OH 50.6 12/25/2020   VD25OH 48.7 06/19/2020   Lab Results  Component Value Date   WBC 4.3 12/25/2020   HGB 14.0 12/25/2020   HCT 41.0 12/25/2020   MCV 91 12/25/2020   PLT 355 12/25/2020   Lab Results  Component Value Date   IRON 110 11/01/2019   TIBC 382 11/01/2019   Attestation Statements:  Reviewed by clinician on day of visit: allergies, medications, problem list, medical history, surgical history, family history, social history, and previous encounter notes.  Time spent on visit including pre-visit chart review and post-visit care and charting was 30 minutes.   I, Trixie Dredge, am acting as transcriptionist for Dennard Nip, MD.  I have reviewed the above documentation for accuracy and completeness, and I agree with the above. -  Dennard Nip, MD

## 2021-11-07 ENCOUNTER — Ambulatory Visit (INDEPENDENT_AMBULATORY_CARE_PROVIDER_SITE_OTHER): Payer: Medicare Other

## 2021-11-07 VITALS — Ht 63.0 in | Wt 170.0 lb

## 2021-11-07 DIAGNOSIS — Z0000 Encounter for general adult medical examination without abnormal findings: Secondary | ICD-10-CM | POA: Diagnosis not present

## 2021-11-07 NOTE — Progress Notes (Signed)
Subjective:   Jennifer [name] is a 71 y.o. female who presents for Medicare Annual (Subsequent) preventive examination.   I connected with  Mignonne Afonso Umar on 11/07/21 by a audio enabled telemedicine application and verified that I am speaking with the correct person using two identifiers.  Patient Location: Home  Provider Location: Home Office  I discussed the limitations of evaluation and management by telemedicine. The patient expressed understanding and agreed to proceed.  Review of Systems     Cardiac Risk Factors include: advanced age (>34mn, >>93women)     Objective:    Today's Vitals   11/07/21 1134  Weight: 170 lb (77.1 kg)  Height: '5\' 3"'$  (1.6 m)   Body mass index is 30.11 kg/m.     11/07/2021   11:38 AM 09/07/2021    2:57 PM 10/12/2020    9:38 AM 09/05/2020    2:49 PM 08/24/2019    3:08 PM 04/19/2019   12:02 PM 07/20/2018   12:30 PM  Advanced Directives  Does Patient Have a Medical Advance Directive? Yes Yes No Yes Yes Yes No  Type of AParamedicof AAu [place] will   [place] DNR (pink MOST or yellow form) [place];Out of facility DNR (pink MOST or yellow form) [place]   Copy of [place] Chart? No - copy requested     Yes - validated most recent copy scanned in chart (See row information)   Would patient like information on creating a medical advance directive?   No - Patient declined    No - Patient declined    Current Medications (verified) Outpatient Encounter Medications as of 11/07/2021  Medication Sig   Biotin 1000 MCG tablet Take 1,000 mcg by mouth daily.   CALCIUM CARBONATE PO Take 600 mg by mouth daily.    cetirizine (ZYRTEC) 10 MG tablet Take 10 mg by mouth every evening.    Cholecalciferol (VITAMIN D) 50 MCG (2000 UT) tablet Take 2,000 Units by mouth daily.   citalopram (CELEXA) 40 MG  tablet TAKE 1 TABLET BY MOUTH EVERY DAY   hydroxypropyl methylcellulose / hypromellose (ISOPTO TEARS / GONIOVISC) 2.5 % ophthalmic solution Place 1 drop into both eyes 2 (two) times daily as needed for dry eyes.   levETIRAcetam (KEPPRA) 500 MG tablet Take 1 tablet (500 mg total) by mouth 2 (two) times daily.   levothyroxine (SYNTHROID) 50 MCG tablet TAKE 1 TABLET BY MOUTH EVERY DAY   losartan (COZAAR) 25 MG tablet TAKE 2 TABLETS BY MOUTH EVERY DAY   Multiple Vitamins-Minerals (MULTIPLE VITAMINS/WOMENS PO) Take 1 tablet by mouth daily.    pravastatin (PRAVACHOL) 80 MG tablet Take 1 tablet (80 mg total) by mouth daily.   No facility-administered encounter medications on file as of 11/07/2021.    Allergies (verified) Crestor [rosuvastatin], Neomycin-bacitracin zn-polymyx, Neomycin-bacitracin-polymyxin [bacitracin-neomycin-polymyxin], Codeine, Feldene [piroxicam], Penicillins, Piroxicam, Sulfa antibiotics, and Sulfonamide derivatives   History: Past Medical History:  Diagnosis Date   Allergy    Anxiety    Constipation    Depression    Dysrhythmia    FLB - ? PAC'S COMES AND GOES   Gallbladder problem    GERD (gastroesophageal reflux disease)    High cholesterol    Hx of adenomatous polyp of colon    2012 - [place]   Hyperlipidemia    Hypertension    Hypothyroid    Joint pain    no current  problems   Osteoarthritis    PLMD (periodic limb movement disorder) 04/24/2012   Prediabetes    Seizures ([place]) 08/29/2013   only 1 seizure episode   Sleep apnea    wears CPAP nightly   Vitamin D deficiency    Past Surgical History:  Procedure Laterality Date   APPENDECTOMY     CESAREAN SECTION      x1   CHOLECYSTECTOMY  1978   COLONOSCOPY     DILATION AND CURETTAGE OF UTERUS     JOINT REPLACEMENT  1998   right knee   OSTEOTOMY PROXIMAL FEMORAL     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   Procedure: TOTAL HIP ARTHROPLASTY ANTERIOR APPROACH;  Surgeon: Frederik Pear, MD;  Location: [facility];   Service: Orthopedics;  Laterality: Right;   TOTAL KNEE ARTHROPLASTY Left 07/20/2018   Procedure: Left Knee Arthroplasty;  Surgeon: Frederik Pear, MD;  Location: [facility];  Service: Orthopedics;  Laterality: Left;   Family History  Problem Relation Age of Onset   Cancer Father 26   Esophageal cancer Father    Alcoholism Father    Heart disease Mother 76       Aortic valve disease   Diabetes Mother    Hypertension Mother    Thyroid disease Mother    Atrial fibrillation Brother    Sleep apnea Brother    Sleep apnea Sister    Sleep apnea Sister    Stomach cancer Maternal Grandmother    Colon cancer Neg Hx    Rectal cancer Neg Hx    Social History   Socioeconomic History   Marital status: Divorced    Spouse name: Not on file   Number of children: 1   Years of education: Not on file   Highest education level: Not on file  Occupational History   Occupation: Retired    Fish farm manager: [employer]  Tobacco Use   Smoking status: Never   Smokeless tobacco: Never  Vaping Use   Vaping Use: Never used  Substance and Sexual Activity   Alcohol use: No    Alcohol/week: 0.0 standard drinks of alcohol   Drug use: No   Sexual activity: Never    Birth control/protection: Post-menopausal  Other Topics Concern   Not on file  Social History Narrative   Lives with blind dog and cat.    Right handed    Social Determinants of Health   Financial Resource Strain: Low Risk  (11/07/2021)   Overall Financial Resource Strain (CARDIA)    Difficulty of Paying Living Expenses: Not hard at all  Food Insecurity: No Food Insecurity (11/07/2021)   Hunger Vital Sign    Worried About Running Out of Food in the Last Year: Never true    Ran Out of Food in the Last Year: Never true  Transportation Needs: No Transportation Needs (11/07/2021)   PRAPARE - Hydrologist (Medical): No    Lack of Transportation (Non-Medical): No  Physical Activity: Inactive (11/07/2021)   Exercise Vital  Sign    Days of Exercise per Week: 0 days    Minutes of Exercise per Session: 0 min  Stress: No Stress Concern Present (11/07/2021)   [place]    Feeling of Stress : Not at all  Social Connections: Moderately Integrated (11/07/2021)   Social Connection and Isolation Panel [NHANES]    Frequency of Communication with Friends and Family: More than three times a week    Frequency  of Social Gatherings with Friends and Family: More than three times a week    Attends Religious Services: More than 4 times per year    Active Member of [employer] or Organizations: Yes    Attends Music therapist: More than 4 times per year    Marital Status: Divorced    Tobacco Counseling Counseling given: Not Answered   Clinical Intake:  Pre-visit preparation completed: Yes  Pain : No/denies pain     Nutritional Risks: None Diabetes: No  How often do you need to have someone help you when you read instructions, pamphlets, or other written materials from your doctor or pharmacy?: 1 - Never  Diabetic?no   Interpreter Needed?: No  Information entered by :: Jadene Pierini, LPN   Activities of Daily Living    11/07/2021   11:38 AM 11/06/2021   11:27 PM  In your present state of health, do you have any difficulty performing the following activities:  Hearing? 0 0  Vision? 0 0  Difficulty concentrating or making decisions? 0 0  Walking or climbing stairs? 0 1  Dressing or bathing? 0 0  Doing errands, shopping? 0 0  Preparing Food and eating ? N N  Using the Toilet? N N  In the past six months, have you accidently leaked urine? N Y  Do you have problems with loss of bowel control? N N  Managing your Medications? N N  Managing your Finances? N N  Housekeeping or managing your Housekeeping? N N    Patient Care Team: Wendie Agreste, MD as PCP - General (Family Medicine) Cameron Sprang, MD as Consulting Physician  (Neurology) Star Age, MD as Attending Physician (Neurology) Starlyn Skeans, MD as Consulting Physician (Family Medicine)  Indicate any recent Medical Services you may have received from other than [facility] providers in the past year (date may be approximate).     Assessment:   This is a routine wellness examination for Taryne.  Hearing/Vision screen Vision Screening - Comments:: Annual eye exams wears glasses   Dietary issues and exercise activities discussed: Current Exercise Habits: The patient does not participate in regular exercise at present, Intensity: Mild   Goals Addressed             This Visit's Progress    Patient Stated   On track    04/19/2019, I will continue to do my exercise tapes 4-5 days a week for about 1-2 miles at a time.        Depression Screen    11/07/2021   11:37 AM 03/12/2021   11:39 AM 10/12/2020    9:36 AM 10/12/2020    9:24 AM 09/06/2020   10:37 AM 06/08/2020    3:30 PM 11/01/2019    8:02 AM  PHQ 2/9 Scores  PHQ - 2 Score 0 0 1 1 0 0 0  PHQ- 9 Score  '1 3 3  1     '$ Fall Risk    11/07/2021   11:35 AM 11/06/2021   11:27 PM 09/07/2021    2:57 PM 03/19/2021   11:44 AM 03/12/2021   11:39 AM  Fall Risk   Falls in the past year? 0  0 0 0  Number falls in past yr: 0 0 0 0 0  Injury with Fall? 0 0 0 0 0  Risk for fall due to : No Fall Risks   No Fall Risks No Fall Risks  Follow up Falls prevention discussed   Falls evaluation completed  Falls evaluation completed    FALL RISK PREVENTION PERTAINING TO THE HOME:  Any stairs in or around the home? Yes  If so, are there any without handrails? No  Home free of loose throw rugs in walkways, pet beds, electrical cords, etc? Yes  Adequate lighting in your home to reduce risk of falls? Yes   ASSISTIVE DEVICES UTILIZED TO PREVENT FALLS:  Life alert? No  Use of a cane, walker or w/c? No  Grab bars in the bathroom? No  Shower chair or bench in shower? No  Elevated toilet seat or a handicapped toilet?  No       04/19/2019   12:07 PM  MMSE - Mini Mental State Exam  Orientation to time 5  Orientation to Place 5  Registration 3  Attention/ Calculation 5  Recall 3  Language- repeat 1        11/07/2021   11:38 AM  6CIT Screen  What Year? 0 points  What month? 0 points  What time? 0 points  Count back from 20 0 points  Months in reverse 0 points  Repeat phrase 0 points  Total Score 0 points    Immunizations Immunization History  Administered Date(s) Administered   Fluad Quad(high Dose 65+) 10/29/2019, 09/07/2020   Influenza, High Dose Seasonal PF 10/08/2016, 09/17/2017, 09/08/2018   Influenza,inj,Quad PF,6+ Mos 09/02/2015   Influenza-Unspecified 11/06/2013   Moderna Sars-Covid-2 Vaccination 02/10/2019, 03/10/2019   PFIZER(Purple Top)SARS-COV-2 Vaccination 11/01/2019, 06/03/2020   Pneumococcal Conjugate-13 03/08/2016   Pneumococcal Polysaccharide-23 12/19/2019   Tdap 06/18/2011   Zoster Recombinat (Shingrix) 07/19/2019, 11/18/2019    TDAP status: Due, Education has been provided regarding the importance of this vaccine. Advised may receive this vaccine at local pharmacy or Health Dept. Aware to provide a copy of the vaccination record if obtained from local pharmacy or Health Dept. Verbalized acceptance and understanding.  Flu Vaccine status: Up to date  Pneumococcal vaccine status: Up to date  Covid-19 vaccine status: Completed vaccines  Qualifies for Shingles Vaccine? Yes   Zostavax completed No   Shingrix Completed?: No.    Education has been provided regarding the importance of this vaccine. Patient has been advised to call insurance company to determine out of pocket expense if they have not yet received this vaccine. Advised may also receive vaccine at local pharmacy or Health Dept. Verbalized acceptance and understanding.  Screening Tests Health Maintenance  Topic Date Due   Diabetic kidney evaluation - Urine ACR  Never done   COVID-19 Vaccine (5 - Mixed  Product risk series) 07/29/2020   TETANUS/TDAP  06/17/2021   COLONOSCOPY (Pts 45-29yr Insurance coverage will need to be confirmed)  04/29/2022   Diabetic kidney evaluation - GFR measurement  06/07/2022   MAMMOGRAM  09/05/2022   Medicare Annual Wellness (AWV)  11/08/2022   Pneumonia Vaccine 70 Years old  Completed   INFLUENZA VACCINE  Completed   DEXA SCAN  Completed   Hepatitis C Screening  Completed   Zoster Vaccines- Shingrix  Completed   HPV VACCINES  Aged Out    Health Maintenance  Health Maintenance Due  Topic Date Due   Diabetic kidney evaluation - Urine ACR  Never done   COVID-19 Vaccine (5 - Mixed Product risk series) 07/29/2020   TETANUS/TDAP  06/17/2021    Colorectal cancer screening: Type of screening: Colonoscopy. Completed 04/29/2019. Repeat every 3 years  Mammogram status: Completed 09/04/2021. Repeat every year  Bone Density status: Completed 09/04/2021. Results reflect: Bone density results: OSTEOPENIA. Repeat every  5 years.  Lung Cancer Screening: (Low Dose CT Chest recommended if Age 34-80 years, 30 pack-year currently smoking OR have quit w/in 15years.) does not qualify.   Lung Cancer Screening Referral: n/a  Additional Screening:  Hepatitis C Screening: does not qualify;   Vision Screening: Recommended annual ophthalmology exams for early detection of glaucoma and other disorders of the eye. Is the patient up to date with their annual eye exam?  Yes  Who is the provider or what is the name of the office in which the patient attends annual eye exams? Dr.Harris  If pt is not established with a provider, would they like to be referred to a provider to establish care? No .   Dental Screening: Recommended annual dental exams for proper oral hygiene  Community Resource Referral / Chronic Care Management: CRR required this visit?  No   CCM required this visit?  No      Plan:     I have personally reviewed and noted the following in the patient's  chart:   Medical and social history Use of alcohol, tobacco or illicit drugs  Current medications and supplements including opioid prescriptions. Patient is not currently taking opioid prescriptions. Functional ability and status Nutritional status Physical activity Advanced directives List of other physicians Hospitalizations, surgeries, and ER visits in previous 12 months Vitals Screenings to include cognitive, depression, and falls Referrals and appointments  In addition, I have reviewed and discussed with patient certain preventive protocols, quality metrics, and best practice recommendations. A written personalized care plan for preventive services as well as general preventive health recommendations were provided to patient.     Daphane Shepherd, LPN   [id number]   Nurse Notes: Due TDAP vaccine

## 2021-11-07 NOTE — Patient Instructions (Signed)
[name] , Thank you for taking time to come for your Medicare Wellness Visit. I appreciate your ongoing commitment to your health goals. Please review the following plan we discussed and let me know if I can assist you in the future.   These are the goals we discussed:  Goals      Patient Stated     04/19/2019, I will continue to do my exercise tapes 4-5 days a week for about 1-2 miles at a time.      Patient Stated     Would like to get back on track with weight loss and exercise         This is a list of the screening recommended for you and due dates:  Health Maintenance  Topic Date Due   Yearly kidney health urinalysis for diabetes  Never done   COVID-19 Vaccine (5 - Mixed Product risk series) 07/29/2020   Tetanus Vaccine  06/17/2021   Colon Cancer Screening  04/29/2022   Yearly kidney function blood test for diabetes  06/07/2022   Mammogram  09/05/2022   Medicare Annual Wellness Visit  11/08/2022   Pneumonia Vaccine  Completed   Flu Shot  Completed   DEXA scan (bone density measurement)  Completed   Hepatitis C Screening: USPSTF Recommendation to screen - Ages 72-79 yo.  Completed   Zoster (Shingles) Vaccine  Completed   HPV Vaccine  Aged Out    Advanced directives: Please bring a copy of your health care power of attorney and living will to the office to be added to your chart at your convenience.   Conditions/risks identified: Aim for 30 minutes of exercise or brisk walking, 6-8 glasses of water, and 5 servings of fruits and vegetables each day.   Next appointment: Follow up in one year for your annual wellness visit.   Preventive Care 37 Years and Older, Female  Preventive care refers to lifestyle choices and visits with your health care provider that can promote health and wellness. What does preventive care include? A yearly physical exam. This is also called an annual well check. Dental exams once or twice a year. Routine eye exams. Ask your health care  provider how often you should have your eyes checked. Personal lifestyle choices, including: Daily care of your teeth and gums. Regular physical activity. Eating a healthy diet. Avoiding tobacco and drug use. Limiting alcohol use. Practicing safe sex. Taking low doses of aspirin every day. Taking vitamin and mineral supplements as recommended by your health care provider. What happens during an annual well check? The services and screenings done by your health care provider during your annual well check will depend on your age, overall health, lifestyle risk factors, and family history of disease. Counseling  Your health care provider may ask you questions about your: Alcohol use. Tobacco use. Drug use. Emotional well-being. Home and relationship well-being. Sexual activity. Eating habits. History of falls. Memory and ability to understand (cognition). Work and work Statistician. Screening  You may have the following tests or measurements: Height, weight, and BMI. Blood pressure. Lipid and cholesterol levels. These may be checked every 5 years, or more frequently if you are over 37 years old. Skin check. Lung cancer screening. You may have this screening every year starting at age 53 if you have a 30-pack-year history of smoking and currently smoke or have quit within the past 15 years. Fecal occult blood test (FOBT) of the stool. You may have this test every year starting at  age 10. Flexible sigmoidoscopy or colonoscopy. You may have a sigmoidoscopy every 5 years or a colonoscopy every 10 years starting at age 101. Prostate cancer screening. Recommendations will vary depending on your family history and other risks. Hepatitis C blood test. Hepatitis B blood test. Sexually transmitted disease (STD) testing. Diabetes screening. This is done by checking your blood sugar (glucose) after you have not eaten for a while (fasting). You may have this done every 1-3 years. Abdominal aortic  aneurysm (AAA) screening. You may need this if you are a current or former smoker. Osteoporosis. You may be screened starting at age 21 if you are at high risk. Talk with your health care provider about your test results, treatment options, and if necessary, the need for more tests. Vaccines  Your health care provider may recommend certain vaccines, such as: Influenza vaccine. This is recommended every year. Tetanus, diphtheria, and acellular pertussis (Tdap, Td) vaccine. You may need a Td booster every 10 years. Zoster vaccine. You may need this after age 56. Pneumococcal 13-valent conjugate (PCV13) vaccine. One dose is recommended after age 37. Pneumococcal polysaccharide (PPSV23) vaccine. One dose is recommended after age 61. Talk to your health care provider about which screenings and vaccines you need and how often you need them. This information is not intended to replace advice given to you by your health care provider. Make sure you discuss any questions you have with your health care provider. Document Released: 01/20/2015 Document Revised: 09/13/2015 Document Reviewed: 10/25/2014 Elsevier Interactive Patient Education  2017 [place] Prevention in the Home Falls can cause injuries. They can happen to people of all ages. There are many things you can do to make your home safe and to help prevent falls. What can I do on the outside of my home? Regularly fix the edges of walkways and driveways and fix any cracks. Remove anything that might make you trip as you walk through a door, such as a raised step or threshold. Trim any bushes or trees on the path to your home. Use bright outdoor lighting. Clear any walking paths of anything that might make someone trip, such as rocks or tools. Regularly check to see if handrails are loose or broken. Make sure that both sides of any steps have handrails. Any raised decks and porches should have guardrails on the edges. Have any leaves,  snow, or ice cleared regularly. Use sand or salt on walking paths during winter. Clean up any spills in your garage right away. This includes oil or grease spills. What can I do in the bathroom? Use night lights. Install grab bars by the toilet and in the tub and shower. Do not use towel bars as grab bars. Use non-skid mats or decals in the tub or shower. If you need to sit down in the shower, use a plastic, non-slip stool. Keep the floor dry. Clean up any water that spills on the floor as soon as it happens. Remove soap buildup in the tub or shower regularly. Attach bath mats securely with double-sided non-slip rug tape. Do not have throw rugs and other things on the floor that can make you trip. What can I do in the bedroom? Use night lights. Make sure that you have a light by your bed that is easy to reach. Do not use any sheets or blankets that are too big for your bed. They should not hang down onto the floor. Have a firm chair that has side arms. You can  use this for support while you get dressed. Do not have throw rugs and other things on the floor that can make you trip. What can I do in the kitchen? Clean up any spills right away. Avoid walking on wet floors. Keep items that you use a lot in easy-to-reach places. If you need to reach something above you, use a strong step stool that has a grab bar. Keep electrical cords out of the way. Do not use floor polish or wax that makes floors slippery. If you must use wax, use non-skid floor wax. Do not have throw rugs and other things on the floor that can make you trip. What can I do with my stairs? Do not leave any items on the stairs. Make sure that there are handrails on both sides of the stairs and use them. Fix handrails that are broken or loose. Make sure that handrails are as long as the stairways. Check any carpeting to make sure that it is firmly attached to the stairs. Fix any carpet that is loose or worn. Avoid having throw  rugs at the top or bottom of the stairs. If you do have throw rugs, attach them to the floor with carpet tape. Make sure that you have a light switch at the top of the stairs and the bottom of the stairs. If you do not have them, ask someone to add them for you. What else can I do to help prevent falls? Wear shoes that: Do not have high heels. Have rubber bottoms. Are comfortable and fit you well. Are closed at the toe. Do not wear sandals. If you use a stepladder: Make sure that it is fully opened. Do not climb a closed stepladder. Make sure that both sides of the stepladder are locked into place. Ask someone to hold it for you, if possible. Clearly mark and make sure that you can see: Any grab bars or handrails. First and last steps. Where the edge of each step is. Use tools that help you move around (mobility aids) if they are needed. These include: Canes. Walkers. Scooters. Crutches. Turn on the lights when you go into a dark area. Replace any light bulbs as soon as they burn out. Set up your furniture so you have a clear path. Avoid moving your furniture around. If any of your floors are uneven, fix them. If there are any pets around you, be aware of where they are. Review your medicines with your doctor. Some medicines can make you feel dizzy. This can increase your chance of falling. Ask your doctor what other things that you can do to help prevent falls. This information is not intended to replace advice given to you by your health care provider. Make sure you discuss any questions you have with your health care provider. Document Released: 10/20/2008 Document Revised: 06/01/2015 Document Reviewed: 01/28/2014 Elsevier Interactive Patient Education  2017 [employer].

## 2021-11-12 DIAGNOSIS — Z23 Encounter for immunization: Secondary | ICD-10-CM | POA: Diagnosis not present

## 2021-11-14 ENCOUNTER — Encounter: Payer: Self-pay | Admitting: Family Medicine

## 2021-11-15 NOTE — Telephone Encounter (Signed)
Patient is asking about blood work related to abscessed tooth, please advise if this is necessary

## 2021-11-19 ENCOUNTER — Ambulatory Visit (INDEPENDENT_AMBULATORY_CARE_PROVIDER_SITE_OTHER): Payer: Medicare Other | Admitting: Family Medicine

## 2021-11-22 ENCOUNTER — Encounter: Payer: Self-pay | Admitting: Neurology

## 2021-11-22 ENCOUNTER — Ambulatory Visit (INDEPENDENT_AMBULATORY_CARE_PROVIDER_SITE_OTHER): Payer: Medicare Other | Admitting: Neurology

## 2021-11-22 VITALS — BP 118/67 | HR 70 | Ht 63.0 in | Wt 182.4 lb

## 2021-11-22 DIAGNOSIS — G4733 Obstructive sleep apnea (adult) (pediatric): Secondary | ICD-10-CM | POA: Diagnosis not present
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
## 2021-11-22 NOTE — Patient Instructions (Addendum)
It was nice to see you again today as always. Happy Holidays and safe travels!  Please continue using your CPAP regularly. While your insurance requires that you use CPAP at least 4 hours each night on 70% of the nights, I recommend, that you not skip any nights and use it throughout the night if you can. Getting used to CPAP and staying with the treatment long term does take time and patience and discipline. Untreated obstructive sleep apnea when it is moderate to severe can have an adverse impact on cardiovascular health and raise her risk for heart disease, arrhythmias, hypertension, congestive heart failure, stroke and diabetes. Untreated obstructive sleep apnea causes sleep disruption, nonrestorative sleep, and sleep deprivation. This can have an impact on your day to day functioning and cause daytime sleepiness and impairment of cognitive function, memory loss, mood disturbance, and problems focussing. Using CPAP regularly can improve these symptoms. We can see you in 1 year, you can see one of our nurse practitioners as you are stable. I will see you after that.

## 2021-11-23 ENCOUNTER — Other Ambulatory Visit: Payer: Self-pay | Admitting: Family Medicine

## 2021-11-23 DIAGNOSIS — E119 Type 2 diabetes mellitus without complications: Secondary | ICD-10-CM

## 2021-11-23 DIAGNOSIS — F329 Major depressive disorder, single episode, unspecified: Secondary | ICD-10-CM

## 2021-11-23 DIAGNOSIS — I10 Essential (primary) hypertension: Secondary | ICD-10-CM

## 2021-12-10 ENCOUNTER — Other Ambulatory Visit: Payer: Self-pay | Admitting: Lab

## 2021-12-10 ENCOUNTER — Encounter: Payer: Self-pay | Admitting: Neurology

## 2021-12-10 ENCOUNTER — Other Ambulatory Visit: Payer: Self-pay | Admitting: Family Medicine

## 2021-12-10 DIAGNOSIS — E119 Type 2 diabetes mellitus without complications: Secondary | ICD-10-CM

## 2021-12-10 DIAGNOSIS — E782 Mixed hyperlipidemia: Secondary | ICD-10-CM

## 2021-12-10 MED ORDER — PRAVASTATIN SODIUM 80 MG PO TABS: 80.0000 mg | ORAL_TABLET | Freq: Every day | ORAL | 3 refills | Status: DC

## 2021-12-17 ENCOUNTER — Encounter (INDEPENDENT_AMBULATORY_CARE_PROVIDER_SITE_OTHER): Payer: Self-pay | Admitting: Family Medicine

## 2021-12-17 ENCOUNTER — Ambulatory Visit (INDEPENDENT_AMBULATORY_CARE_PROVIDER_SITE_OTHER): Payer: Medicare Other | Admitting: Family Medicine

## 2021-12-17 VITALS — BP 137/56 | HR 69 | Temp 97.9°F | Ht 63.0 in | Wt 172.0 lb

## 2021-12-17 DIAGNOSIS — E669 Obesity, unspecified: Secondary | ICD-10-CM

## 2021-12-17 DIAGNOSIS — E039 Hypothyroidism, unspecified: Secondary | ICD-10-CM

## 2021-12-17 DIAGNOSIS — R739 Hyperglycemia, unspecified: Secondary | ICD-10-CM

## 2021-12-17 DIAGNOSIS — E559 Vitamin D deficiency, unspecified: Secondary | ICD-10-CM

## 2021-12-17 DIAGNOSIS — K122 Cellulitis and abscess of mouth: Secondary | ICD-10-CM

## 2021-12-17 DIAGNOSIS — E782 Mixed hyperlipidemia: Secondary | ICD-10-CM

## 2021-12-17 DIAGNOSIS — Z6830 Body mass index (BMI) 30.0-30.9, adult: Secondary | ICD-10-CM

## 2021-12-18 LAB — CBC WITH DIFFERENTIAL/PLATELET
Basophils Absolute: 0.1 10*3/uL (ref 0.0–0.2)
Basos: 1 %
EOS (ABSOLUTE): 0.1 10*3/uL (ref 0.0–0.4)
Eos: 1 %
Hematocrit: 40.7 % (ref 34.0–46.6)
Hemoglobin: 13.6 g/dL (ref 11.1–15.9)
Immature Grans (Abs): 0 10*3/uL (ref 0.0–0.1)
Immature Granulocytes: 0 %
Lymphocytes Absolute: 0.8 10*3/uL (ref 0.7–3.1)
Lymphs: 16 %
MCH: 30.4 pg (ref 26.6–33.0)
MCHC: 33.4 g/dL (ref 31.5–35.7)
MCV: 91 fL (ref 79–97)
Monocytes Absolute: 0.5 10*3/uL (ref 0.1–0.9)
Monocytes: 10 %
Neutrophils Absolute: 3.5 10*3/uL (ref 1.4–7.0)
Neutrophils: 72 %
Platelets: 344 10*3/uL (ref 150–450)
RBC: 4.47 x10E6/uL (ref 3.77–5.28)
RDW: 11.8 % (ref 11.7–15.4)
WBC: 4.8 10*3/uL (ref 3.4–10.8)

## 2021-12-18 LAB — CMP14+EGFR
ALT: 19 IU/L (ref 0–32)
AST: 19 IU/L (ref 0–40)
Albumin/Globulin Ratio: 1.5 (ref 1.2–2.2)
Albumin: 4.6 g/dL (ref 3.9–4.9)
Alkaline Phosphatase: 104 IU/L (ref 44–121)
BUN/Creatinine Ratio: 26 (ref 12–28)
BUN: 20 mg/dL (ref 8–27)
Bilirubin Total: 0.6 mg/dL (ref 0.0–1.2)
CO2: 24 mmol/L (ref 20–29)
Calcium: 10.2 mg/dL (ref 8.7–10.3)
Chloride: 99 mmol/L (ref 96–106)
Creatinine, Ser: 0.78 mg/dL (ref 0.57–1.00)
Globulin, Total: 3 g/dL (ref 1.5–4.5)
Glucose: 84 mg/dL (ref 70–99)
Potassium: 4.7 mmol/L (ref 3.5–5.2)
Sodium: 136 mmol/L (ref 134–144)
Total Protein: 7.6 g/dL (ref 6.0–8.5)
eGFR: 82 mL/min/{1.73_m2} (ref >59)

## 2021-12-18 LAB — VITAMIN B12: Vitamin B-12: 1171 pg/mL (ref 232–1245)

## 2021-12-18 LAB — VITAMIN D 25 HYDROXY (VIT D DEFICIENCY, FRACTURES): Vit D, 25-Hydroxy: 45.9 ng/mL (ref 30.0–100.0)

## 2021-12-18 LAB — HEMOGLOBIN A1C
Est. average glucose Bld gHb Est-mCnc: 105 mg/dL
Hgb A1c MFr Bld: 5.3 % (ref 4.8–5.6)

## 2021-12-18 LAB — LIPID PANEL WITH LDL/HDL RATIO
Cholesterol, Total: 220 mg/dL — ABNORMAL HIGH (ref 100–199)
HDL: 57 mg/dL (ref >39)
LDL Chol Calc (NIH): 137 mg/dL — ABNORMAL HIGH (ref 0–99)
LDL/HDL Ratio: 2.4 ratio (ref 0.0–3.2)
Triglycerides: 147 mg/dL (ref 0–149)
VLDL Cholesterol Cal: 26 mg/dL (ref 5–40)

## 2021-12-18 LAB — INSULIN, RANDOM: INSULIN: 6.8 u[IU]/mL (ref 2.6–24.9)

## 2021-12-18 LAB — TSH: TSH: 4.07 u[IU]/mL (ref 0.450–4.500)

## 2022-01-02 NOTE — Progress Notes (Signed)
Chief Complaint:   OBESITY Jennifer [name] is here to discuss her progress with her obesity treatment plan along with follow-up of her obesity related diagnoses. Jennifer [name] is on the Category 1 Plan and states she is following her eating plan approximately 50% of the time. Jennifer [name] states she is doing 0 minutes 0 times per week.  Today's visit was #: 23 Starting weight: 208 lbs Starting date: 08/25/2018 Today's weight: 172 lbs Today's date: 12/17/2021 Total lbs lost to date: 36 Total lbs lost since last in-office visit: 0  Interim History: Jennifer [name] has struggled more with increased sugar especially with increased temptations.  She will be traveling for Christmas and she anticipates healthy eating will be difficult.  She is making strategies to help minimize holiday weight gain.  Subjective:   1. Mouth abscess Jennifer [name] has been dealing with a dental abscess and increased inflammation.  Her tooth was pulled recently.  2. Mixed hyperlipidemia Jennifer [name] is working on her diet and she is on a statin.  She is due to have labs.  3. Vitamin D deficiency Jennifer [name] is on vitamin D 2000 units daily OTC, and she is due to have labs.  4. Hypothyroidism, unspecified type Jennifer [name] is on Synthroid, and no tremors or palpitations were mentioned.  She is due to have labs rechecked.  5. Hyperglycemia Jennifer [name] is working on her diet, and she is due to have labs.  Assessment/Plan:   1. Mouth abscess Jennifer [name] was encouraged to eat soft protein rich foods to help.  2. Mixed hyperlipidemia We will check labs today. Cardiovascular risk and specific lipid/LDL goals reviewed.  We discussed several lifestyle modifications today and Jennifer [name] will continue to work on diet, exercise and weight loss efforts. Orders and follow up as documented in patient record.   - Lipid Panel With LDL/HDL Ratio - CBC with Differential/Platelet  3. Vitamin D deficiency We will check labs today.  Jennifer [name] will continue OTC Vitamin D  2,000 IU daily and will follow-up for routine testing of Vitamin D, at least 2-3 times per year to avoid over-replacement.  - VITAMIN D 25 Hydroxy (Vit-D Deficiency, Fractures)  4. Hypothyroidism, unspecified type We will check labs today, and we will follow-up at Jennifer [name] next visit. Orders and follow up as documented in patient record.  - TSH  5. Hyperglycemia We will check labs today, and results with be discussed with Jennifer [name] in 4 weeks at her follow up visit. In the meanwhile Jennifer [name] will continue working on her diet and exercise.  - Vitamin B12 - CMP14+EGFR - Insulin, random - Hemoglobin A1c  6. Obesity, Current BMI 30.5 Jennifer [name] is currently in the action stage of change. As such, her goal is to continue with weight loss efforts. She has agreed to the Category 1 Plan.   Behavioral modification strategies: holiday eating strategies .  Jennifer [name] has agreed to follow-up with our clinic in 4 weeks. She was informed of the importance of frequent follow-up visits to maximize her success with intensive lifestyle modifications for her multiple health conditions.   Jennifer [name] was informed we would discuss her lab results at her next visit unless there is a critical issue that needs to be addressed sooner. Jennifer [name] agreed to keep her next visit at the agreed upon time to discuss these results.  Objective:   Blood pressure (!) 137/56, pulse 69, temperature 97.9 F (36.6 C), height _0  (1.6 m), weight 172 lb (78 kg), SpO2 95 %. Body mass index is 30.47 kg/m.  General: Cooperative, alert,  well developed, in no acute distress. HEENT: Conjunctivae and lids unremarkable. Cardiovascular: Regular rhythm.  Lungs: Normal work of breathing. Neurologic: No focal deficits.   Lab Results  Component Value Date   CREATININE 0.78 12/17/2021   BUN 20 12/17/2021   NA 136 12/17/2021   K 4.7 12/17/2021   CL 99 12/17/2021   CO2 24 12/17/2021   Lab Results  Component Value Date   ALT 19  12/17/2021   AST 19 12/17/2021   ALKPHOS 104 12/17/2021   BILITOT 0.6 12/17/2021   Lab Results  Component Value Date   HGBA1C 5.3 12/17/2021   HGBA1C 5.2 06/06/2021   HGBA1C 5.0 12/25/2020   HGBA1C 5.2 01/17/2020   HGBA1C 5.4 05/05/2019   Lab Results  Component Value Date   INSULIN 6.8 12/17/2021   INSULIN 9.4 06/06/2021   INSULIN 6.8 12/25/2020   INSULIN 4.7 01/17/2020   INSULIN 13.5 05/05/2019   Lab Results  Component Value Date   TSH 4.070 12/17/2021   Lab Results  Component Value Date   CHOL 220 (H) 12/17/2021   HDL 57 12/17/2021   LDLCALC 137 (H) 12/17/2021   LDLDIRECT 96.0 05/04/2018   TRIG 147 12/17/2021   CHOLHDL 3.8 12/25/2020   Lab Results  Component Value Date   VD25OH 45.9 12/17/2021   VD25OH 56.0 06/06/2021   VD25OH 50.6 12/25/2020   Lab Results  Component Value Date   WBC 4.8 12/17/2021   HGB 13.6 12/17/2021   HCT 40.7 12/17/2021   MCV 91 12/17/2021   PLT 344 12/17/2021   Lab Results  Component Value Date   IRON 110 11/01/2019   TIBC 382 11/01/2019   Attestation Statements:   Reviewed by clinician on day of visit: allergies, medications, problem list, medical history, surgical history, family history, social history, and previous encounter notes.   I, Trixie Dredge, am acting as transcriptionist for Dennard Nip, MD.  I have reviewed the above documentation for accuracy and completeness, and I agree with the above. -  Dennard Nip, MD

## 2022-01-14 ENCOUNTER — Ambulatory Visit (INDEPENDENT_AMBULATORY_CARE_PROVIDER_SITE_OTHER): Payer: Medicare Other | Admitting: Family Medicine

## 2022-01-16 ENCOUNTER — Ambulatory Visit (INDEPENDENT_AMBULATORY_CARE_PROVIDER_SITE_OTHER): Payer: Medicare Other | Admitting: Family Medicine

## 2022-01-16 ENCOUNTER — Encounter (INDEPENDENT_AMBULATORY_CARE_PROVIDER_SITE_OTHER): Payer: Self-pay | Admitting: Family Medicine

## 2022-01-16 VITALS — BP 120/61 | HR 70 | Temp 98.2°F | Ht 63.0 in | Wt 175.0 lb

## 2022-01-16 DIAGNOSIS — E782 Mixed hyperlipidemia: Secondary | ICD-10-CM

## 2022-01-16 DIAGNOSIS — E669 Obesity, unspecified: Secondary | ICD-10-CM | POA: Diagnosis not present

## 2022-01-16 DIAGNOSIS — Z6831 Body mass index (BMI) 31.0-31.9, adult: Secondary | ICD-10-CM | POA: Diagnosis not present

## 2022-01-16 DIAGNOSIS — E559 Vitamin D deficiency, unspecified: Secondary | ICD-10-CM

## 2022-01-28 NOTE — Progress Notes (Signed)
Chief Complaint:   OBESITY Jennifer [name] is here to discuss her progress with her obesity treatment plan along with follow-up of her obesity related diagnoses. Jennifer [name] is on the Category 1 Plan and states she is following her eating plan approximately 50% of the time. Jennifer [name] states she is doing 0 minutes 0 times per week.  Today's visit was #: 47 Starting weight: 208 lbs Starting date: 08/25/2018 Today's weight: 175 lbs Today's date: 01/16/2022 Total lbs lost to date: 33 Total lbs lost since last in-office visit: 0  Interim History: Jennifer [name] has been especially busy lately and life has been especially hectic. She is retaining some fluid today. She did some celebration eating over the holidays, but already gotten back on track. She is thinking about increasing exercise.   Subjective:   1. Mixed hyperlipidemia Jennifer [name]'s LDL was elevated recently, and worsening. This likely related to holiday eating. No side effects with Pravachol. I discussed lab with the patient today.   2. Vitamin D deficiency Jennifer [name] Vitamin D level was at goal. No side effects were noted. I discussed labs with the patient today.   Assessment/Plan:   1. Mixed hyperlipidemia Jennifer [name] will continue with her diet and medications, and we will recheck labs in 3 months.   2. Vitamin D deficiency Jennifer [name] will continue OTC Vitamin D, and we will recheck labs in 3 months.   3. Obesity, Current BMI 31.1 Jennifer [name] is currently in the action stage of change. As such, her goal is to continue with weight loss efforts. She has agreed to the Category 1 Plan.   Behavioral modification strategies: increasing lean protein intake.  [name] has agreed to follow-up with our clinic in 4 weeks. She was informed of the importance of frequent follow-up visits to maximize her success with intensive lifestyle modifications for her multiple health conditions.   Objective:   Blood pressure 120/61, pulse 70, temperature 98.2 F (36.8 C),  height '5\' 3"'$  (1.6 m), weight 175 lb (79.4 kg), SpO2 95 %. Body mass index is 31 kg/m.  General: Cooperative, alert, well developed, in no acute distress. HEENT: Conjunctivae and lids unremarkable. Cardiovascular: Regular rhythm.  Lungs: Normal work of breathing. Neurologic: No focal deficits.   Lab Results  Component Value Date   CREATININE 0.78 12/17/2021   BUN 20 12/17/2021   NA 136 12/17/2021   K 4.7 12/17/2021   CL 99 12/17/2021   CO2 24 12/17/2021   Lab Results  Component Value Date   ALT 19 12/17/2021   AST 19 12/17/2021   ALKPHOS 104 12/17/2021   BILITOT 0.6 12/17/2021   Lab Results  Component Value Date   HGBA1C 5.3 12/17/2021   HGBA1C 5.2 06/06/2021   HGBA1C 5.0 12/25/2020   HGBA1C 5.2 01/17/2020   HGBA1C 5.4 05/05/2019   Lab Results  Component Value Date   INSULIN 6.8 12/17/2021   INSULIN 9.4 06/06/2021   INSULIN 6.8 12/25/2020   INSULIN 4.7 01/17/2020   INSULIN 13.5 05/05/2019   Lab Results  Component Value Date   TSH 4.070 12/17/2021   Lab Results  Component Value Date   CHOL 220 (H) 12/17/2021   HDL 57 12/17/2021   LDLCALC 137 (H) 12/17/2021   LDLDIRECT 96.0 05/04/2018   TRIG 147 12/17/2021   CHOLHDL 3.8 12/25/2020   Lab Results  Component Value Date   VD25OH 45.9 12/17/2021   VD25OH 56.0 06/06/2021   VD25OH 50.6 12/25/2020   Lab Results  Component Value Date   WBC 4.8 12/17/2021  HGB 13.6 12/17/2021   HCT 40.7 12/17/2021   MCV 91 12/17/2021   PLT 344 12/17/2021   Lab Results  Component Value Date   IRON 110 11/01/2019   TIBC 382 11/01/2019   Attestation Statements:   Reviewed by clinician on day of visit: allergies, medications, problem list, medical history, surgical history, family history, social history, and previous encounter notes.  Time spent on visit including pre-visit chart review and post-visit care and charting was 30 minutes.   I, Trixie Dredge, am acting as transcriptionist for Dennard Nip, MD.  I have  reviewed the above documentation for accuracy and completeness, and I agree with the above. -  Dennard Nip, MD

## 2022-02-13 ENCOUNTER — Ambulatory Visit (INDEPENDENT_AMBULATORY_CARE_PROVIDER_SITE_OTHER): Payer: Medicare Other | Admitting: Family Medicine

## 2022-02-20 ENCOUNTER — Encounter (INDEPENDENT_AMBULATORY_CARE_PROVIDER_SITE_OTHER): Payer: Self-pay | Admitting: Family Medicine

## 2022-02-20 ENCOUNTER — Other Ambulatory Visit: Payer: Self-pay | Admitting: Family Medicine

## 2022-02-20 ENCOUNTER — Ambulatory Visit (INDEPENDENT_AMBULATORY_CARE_PROVIDER_SITE_OTHER): Payer: Medicare Other | Admitting: Family Medicine

## 2022-02-20 VITALS — BP 151/69 | HR 70 | Temp 97.7°F | Ht 63.0 in | Wt 177.0 lb

## 2022-02-20 DIAGNOSIS — Z6834 Body mass index (BMI) 34.0-34.9, adult: Secondary | ICD-10-CM | POA: Insufficient documentation

## 2022-02-20 DIAGNOSIS — Z6831 Body mass index (BMI) 31.0-31.9, adult: Secondary | ICD-10-CM

## 2022-02-20 DIAGNOSIS — I10 Essential (primary) hypertension: Secondary | ICD-10-CM

## 2022-02-20 DIAGNOSIS — E669 Obesity, unspecified: Secondary | ICD-10-CM | POA: Diagnosis not present

## 2022-02-20 DIAGNOSIS — E039 Hypothyroidism, unspecified: Secondary | ICD-10-CM

## 2022-02-20 DIAGNOSIS — F3289 Other specified depressive episodes: Secondary | ICD-10-CM

## 2022-02-20 DIAGNOSIS — Z6832 Body mass index (BMI) 32.0-32.9, adult: Secondary | ICD-10-CM | POA: Insufficient documentation

## 2022-03-06 NOTE — Progress Notes (Signed)
Chief Complaint:   OBESITY Jennifer [name] is here to discuss her progress with her obesity treatment plan along with follow-up of her obesity related diagnoses. Jennifer [name] is on the Category 1 Plan and states she is following her eating plan approximately 50% of the time. Jennifer [name] states she is doing 0 minutes 0 times per week.  Today's visit was #: 33 Starting weight: 208 lbs Starting date: 08/25/2018 Today's weight: 177 lbs Today's date: 02/20/2022 Total lbs lost to date: 31 Total lbs lost since last in-office visit: 0  Interim History: Jennifer [name] has been struggling more with her weight recently.  She has done more celebration eating recently, but she tries to portion control during those times.  She notes struggling with more social eating.  She is getting ready to start pool therapy as part of her exercise.  Subjective:   1. Essential hypertension Jennifer [name]'s blood pressure is elevated today.  She is normally better controlled.  She denies chest pain or headache.  2. Emotional Eating Behavior Jennifer [name] is struggling more with some emotional eating behaviors.  She is frustrated that her weight has gone up, but she is ready to get back to her weight loss efforts.  Assessment/Plan:   1. Essential hypertension Jennifer [name] is to continue her medications, diet, and exercise.  We will recheck her blood pressure at her next visit in 4 weeks.  2. Emotional Eating Behavior Emotional eating behavior strategies were discussed, and we will continue to monitor.  3. BMI 31.0-31.9,adult  4. Obesity, Beginning BMI 36.8 Jennifer [name] is currently in the action stage of change. As such, her goal is to continue with weight loss efforts. She has agreed to the Category 1 Plan.   Exercise goals: All adults should avoid inactivity. Some physical activity is better than none, and adults who participate in any amount of physical activity gain some health benefits. Patient was given motivational strategies to help her get  into a regular exercise routine.   Behavioral modification strategies: increasing lean protein intake and meal planning and cooking strategies.  Jennifer [name] has agreed to follow-up with our clinic in 4 to 5 weeks. She was informed of the importance of frequent follow-up visits to maximize her success with intensive lifestyle modifications for her multiple health conditions.   Objective:   Blood pressure (!) 151/69, pulse 70, temperature 97.7 F (36.5 C), height '5\' 3"'$  (1.6 m), weight 177 lb (80.3 kg), SpO2 94 %. Body mass index is 31.35 kg/m.  Lab Results  Component Value Date   CREATININE 0.78 12/17/2021   BUN 20 12/17/2021   NA 136 12/17/2021   K 4.7 12/17/2021   CL 99 12/17/2021   CO2 24 12/17/2021   Lab Results  Component Value Date   ALT 19 12/17/2021   AST 19 12/17/2021   ALKPHOS 104 12/17/2021   BILITOT 0.6 12/17/2021   Lab Results  Component Value Date   HGBA1C 5.3 12/17/2021   HGBA1C 5.2 06/06/2021   HGBA1C 5.0 12/25/2020   HGBA1C 5.2 01/17/2020   HGBA1C 5.4 05/05/2019   Lab Results  Component Value Date   INSULIN 6.8 12/17/2021   INSULIN 9.4 06/06/2021   INSULIN 6.8 12/25/2020   INSULIN 4.7 01/17/2020   INSULIN 13.5 05/05/2019   Lab Results  Component Value Date   TSH 4.070 12/17/2021   Lab Results  Component Value Date   CHOL 220 (H) 12/17/2021   HDL 57 12/17/2021   LDLCALC 137 (H) 12/17/2021   LDLDIRECT 96.0 05/04/2018   TRIG  147 12/17/2021   CHOLHDL 3.8 12/25/2020   Lab Results  Component Value Date   VD25OH 45.9 12/17/2021   VD25OH 56.0 06/06/2021   VD25OH 50.6 12/25/2020   Lab Results  Component Value Date   WBC 4.8 12/17/2021   HGB 13.6 12/17/2021   HCT 40.7 12/17/2021   MCV 91 12/17/2021   PLT 344 12/17/2021   Lab Results  Component Value Date   IRON 110 11/01/2019   TIBC 382 11/01/2019   Attestation Statements:   Reviewed by clinician on day of visit: allergies, medications, problem list, medical history, surgical history,  family history, social history, and previous encounter notes.  I have personally spent 41 minutes total time today in preparation, patient care, and documentation for this visit, including the following: review of clinical lab tests; review of medical tests/procedures/services.   I, Trixie Dredge, am acting as transcriptionist for Dennard Nip, MD.  I have reviewed the above documentation for accuracy and completeness, and I agree with the above. -  Dennard Nip, MD

## 2022-03-13 ENCOUNTER — Encounter: Payer: Self-pay | Admitting: Family Medicine

## 2022-03-20 ENCOUNTER — Ambulatory Visit (INDEPENDENT_AMBULATORY_CARE_PROVIDER_SITE_OTHER): Payer: Medicare Other | Admitting: Family Medicine

## 2022-03-20 ENCOUNTER — Encounter (INDEPENDENT_AMBULATORY_CARE_PROVIDER_SITE_OTHER): Payer: Self-pay | Admitting: Family Medicine

## 2022-03-20 VITALS — BP 125/64 | HR 61 | Temp 97.7°F | Ht 63.0 in | Wt 178.0 lb

## 2022-03-20 DIAGNOSIS — Z6831 Body mass index (BMI) 31.0-31.9, adult: Secondary | ICD-10-CM

## 2022-03-20 DIAGNOSIS — I10 Essential (primary) hypertension: Secondary | ICD-10-CM

## 2022-03-20 DIAGNOSIS — E669 Obesity, unspecified: Secondary | ICD-10-CM | POA: Diagnosis not present

## 2022-03-27 NOTE — Progress Notes (Unsigned)
Chief Complaint:   OBESITY Jennifer [name] is here to discuss her progress with her obesity treatment plan along with follow-up of her obesity related diagnoses. Jennifer [name] is on the Category 1 Plan and states she is following her eating plan approximately 60% of the time. Jennifer [name] states she is doing 0 minutes 0 times per week.  Today's visit was #: 15 Starting weight: 208 lbs Starting date: 08/25/2018 Today's weight: 178 lbs Today's date: 03/20/2022 Total lbs lost to date: 30 Total lbs lost since last in-office visit: 0  Interim History: Jennifer [name] has been walking more at the gym, but she fell on the track is still recovery. She is working on meal planning but she has had more low calorie snacks.   Subjective:   1. Essential hypertension Jennifer [name]'s initial blood pressure is elevated today, but it was within normal limits when repeated. She headache or medication side effects. She continue to work on her diet and weight loss.   Assessment/Plan:   1. Essential hypertension Jennifer [name] will continue losartan and her diet, and we will continue to monitor and support the patient.   2. BMI 31.0-31.9,adult  3. Obesity, Beginning BMI 36.8 Jennifer [name] is currently in the action stage of change. As such, her goal is to continue with weight loss efforts. She has agreed to the Category 1 Plan.   Jennifer [name] is to work on decreasing calories in her snacks. We brainstormed ideas to help her find better options that keep her satisfied.   Behavioral modification strategies: better snacking choices.  Jennifer [name] has agreed to follow-up with our clinic in 4 weeks. She was informed of the importance of frequent follow-up visits to maximize her success with intensive lifestyle modifications for her multiple health conditions.   Objective:   Blood pressure 125/64, pulse 61, temperature 97.7 F (36.5 C), height 5\' 3"  (1.6 m), weight 178 lb (80.7 kg), SpO2 90 %. Body mass index is 31.53 kg/m.  Lab Results  Component  Value Date   CREATININE 0.78 12/17/2021   BUN 20 12/17/2021   NA 136 12/17/2021   K 4.7 12/17/2021   CL 99 12/17/2021   CO2 24 12/17/2021   Lab Results  Component Value Date   ALT 19 12/17/2021   AST 19 12/17/2021   ALKPHOS 104 12/17/2021   BILITOT 0.6 12/17/2021   Lab Results  Component Value Date   HGBA1C 5.3 12/17/2021   HGBA1C 5.2 06/06/2021   HGBA1C 5.0 12/25/2020   HGBA1C 5.2 01/17/2020   HGBA1C 5.4 05/05/2019   Lab Results  Component Value Date   INSULIN 6.8 12/17/2021   INSULIN 9.4 06/06/2021   INSULIN 6.8 12/25/2020   INSULIN 4.7 01/17/2020   INSULIN 13.5 05/05/2019   Lab Results  Component Value Date   TSH 4.070 12/17/2021   Lab Results  Component Value Date   CHOL 220 (H) 12/17/2021   HDL 57 12/17/2021   LDLCALC 137 (H) 12/17/2021   LDLDIRECT 96.0 05/04/2018   TRIG 147 12/17/2021   CHOLHDL 3.8 12/25/2020   Lab Results  Component Value Date   VD25OH 45.9 12/17/2021   VD25OH 56.0 06/06/2021   VD25OH 50.6 12/25/2020   Lab Results  Component Value Date   WBC 4.8 12/17/2021   HGB 13.6 12/17/2021   HCT 40.7 12/17/2021   MCV 91 12/17/2021   PLT 344 12/17/2021   Lab Results  Component Value Date   IRON 110 11/01/2019   TIBC 382 11/01/2019   Attestation Statements:   Reviewed by clinician  on day of visit: allergies, medications, problem list, medical history, surgical history, family history, social history, and previous encounter notes.  Time spent on visit including pre-visit chart review and post-visit care and charting was 40 minutes.   I, Trixie Dredge, am acting as transcriptionist for Dennard Nip, MD.  I have reviewed the above documentation for accuracy and completeness, and I agree with the above. -  Dennard Nip, MD

## 2022-04-17 ENCOUNTER — Encounter (INDEPENDENT_AMBULATORY_CARE_PROVIDER_SITE_OTHER): Payer: Self-pay | Admitting: Family Medicine

## 2022-04-17 ENCOUNTER — Ambulatory Visit (INDEPENDENT_AMBULATORY_CARE_PROVIDER_SITE_OTHER): Payer: Medicare Other | Admitting: Family Medicine

## 2022-04-17 VITALS — BP 122/70 | HR 65 | Temp 98.2°F | Ht 63.0 in | Wt 176.0 lb

## 2022-04-17 DIAGNOSIS — E88819 Insulin resistance, unspecified: Secondary | ICD-10-CM

## 2022-04-17 DIAGNOSIS — Z6831 Body mass index (BMI) 31.0-31.9, adult: Secondary | ICD-10-CM | POA: Diagnosis not present

## 2022-04-17 DIAGNOSIS — E669 Obesity, unspecified: Secondary | ICD-10-CM | POA: Diagnosis not present

## 2022-04-17 DIAGNOSIS — F439 Reaction to severe stress, unspecified: Secondary | ICD-10-CM

## 2022-04-17 NOTE — Progress Notes (Signed)
Chief Complaint:   OBESITY Jennifer [name] is here to discuss her progress with her obesity treatment plan along with follow-up of her obesity related diagnoses. Jennifer [name] is on the Category 1 Plan and states she is following her eating plan approximately 50% of the time. Jennifer [name] states she is walking with a walking program off and on.    Today's visit was #: 49 Starting weight: 208 lbs Starting date: 08/25/2018 Today's weight: 176 lbs Today's date: 04/17/2022 Total lbs lost to date: 32 Total lbs lost since last in-office visit: 2  Interim History: Jennifer [name] continues to do well with her weight loss.  She has had extra challenges with traveling and with family conflict.  She remained mindful despite being unable to follow her plan.  Subjective:   1. Stress Jennifer [name] has multiple stressors, and she feels she is worrying more about things that she cannot control.  She feels she is able to work through those thoughts.  2. Insulin resistance Jennifer [name] has done better significantly with decreasing sugar in her diet.  Assessment/Plan:   1. Stress Jennifer [name] will continue to concentrate on what she is doing well that she can control.  She will continue Celexa, and she agreed to let me know if her thoughts worsen.  She denies suicidal ideations.  2. Insulin resistance Jennifer [name] will continue to work on her diet and exercise, and we will recheck labs in 1 month.  3. BMI 31.0-31.9,adult  4. Obesity, Beginning BMI 36.8 Jennifer [name] is currently in the action stage of change. As such, her goal is to continue with weight loss efforts. She has agreed to the Category 1 Plan.   Exercise goals: As is.   Behavioral modification strategies: emotional eating strategies.  Jennifer [name] has agreed to follow-up with our clinic in 4 weeks. She was informed of the importance of frequent follow-up visits to maximize her success with intensive lifestyle modifications for her multiple health conditions.   Objective:   Blood  pressure 122/70, pulse 65, temperature 98.2 F (36.8 C), height 5\' 3"  (1.6 m), weight 176 lb (79.8 kg), SpO2 93 %. Body mass index is 31.18 kg/m.  Lab Results  Component Value Date   CREATININE 0.78 12/17/2021   BUN 20 12/17/2021   NA 136 12/17/2021   K 4.7 12/17/2021   CL 99 12/17/2021   CO2 24 12/17/2021   Lab Results  Component Value Date   ALT 19 12/17/2021   AST 19 12/17/2021   ALKPHOS 104 12/17/2021   BILITOT 0.6 12/17/2021   Lab Results  Component Value Date   HGBA1C 5.3 12/17/2021   HGBA1C 5.2 06/06/2021   HGBA1C 5.0 12/25/2020   HGBA1C 5.2 01/17/2020   HGBA1C 5.4 05/05/2019   Lab Results  Component Value Date   INSULIN 6.8 12/17/2021   INSULIN 9.4 06/06/2021   INSULIN 6.8 12/25/2020   INSULIN 4.7 01/17/2020   INSULIN 13.5 05/05/2019   Lab Results  Component Value Date   TSH 4.070 12/17/2021   Lab Results  Component Value Date   CHOL 220 (H) 12/17/2021   HDL 57 12/17/2021   LDLCALC 137 (H) 12/17/2021   LDLDIRECT 96.0 05/04/2018   TRIG 147 12/17/2021   CHOLHDL 3.8 12/25/2020   Lab Results  Component Value Date   VD25OH 45.9 12/17/2021   VD25OH 56.0 06/06/2021   VD25OH 50.6 12/25/2020   Lab Results  Component Value Date   WBC 4.8 12/17/2021   HGB 13.6 12/17/2021   HCT 40.7 12/17/2021   MCV 91  12/17/2021   PLT 344 12/17/2021   Lab Results  Component Value Date   IRON 110 11/01/2019   TIBC 382 11/01/2019   Attestation Statements:   Reviewed by clinician on day of visit: allergies, medications, problem list, medical history, surgical history, family history, social history, and previous encounter notes.  Time spent on visit including pre-visit chart review and post-visit care and charting was 32 minutes.   I, Burt Knack, am acting as transcriptionist for Quillian Quince, MD.  I have reviewed the above documentation for accuracy and completeness, and I agree with the above. -  Quillian Quince, MD

## 2022-04-18 DIAGNOSIS — L814 Other melanin hyperpigmentation: Secondary | ICD-10-CM | POA: Diagnosis not present

## 2022-04-18 DIAGNOSIS — D2272 Melanocytic nevi of left lower limb, including hip: Secondary | ICD-10-CM | POA: Diagnosis not present

## 2022-04-18 DIAGNOSIS — L820 Inflamed seborrheic keratosis: Secondary | ICD-10-CM | POA: Diagnosis not present

## 2022-04-18 DIAGNOSIS — L578 Other skin changes due to chronic exposure to nonionizing radiation: Secondary | ICD-10-CM | POA: Diagnosis not present

## 2022-04-18 DIAGNOSIS — D1801 Hemangioma of skin and subcutaneous tissue: Secondary | ICD-10-CM | POA: Diagnosis not present

## 2022-04-18 DIAGNOSIS — D225 Melanocytic nevi of trunk: Secondary | ICD-10-CM | POA: Diagnosis not present

## 2022-04-18 DIAGNOSIS — L821 Other seborrheic keratosis: Secondary | ICD-10-CM | POA: Diagnosis not present

## 2022-04-19 ENCOUNTER — Encounter: Payer: Self-pay | Admitting: Internal Medicine

## 2022-05-08 ENCOUNTER — Encounter: Payer: Medicare Other | Admitting: Internal Medicine

## 2022-05-15 ENCOUNTER — Ambulatory Visit (INDEPENDENT_AMBULATORY_CARE_PROVIDER_SITE_OTHER): Payer: Medicare Other | Admitting: Family Medicine

## 2022-05-20 ENCOUNTER — Other Ambulatory Visit: Payer: Self-pay

## 2022-05-20 ENCOUNTER — Other Ambulatory Visit: Payer: Self-pay | Admitting: Family Medicine

## 2022-05-20 ENCOUNTER — Ambulatory Visit (AMBULATORY_SURGERY_CENTER): Payer: Medicare Other | Admitting: *Deleted

## 2022-05-20 VITALS — Ht 63.0 in | Wt 178.0 lb

## 2022-05-20 DIAGNOSIS — E119 Type 2 diabetes mellitus without complications: Secondary | ICD-10-CM

## 2022-05-20 DIAGNOSIS — I10 Essential (primary) hypertension: Secondary | ICD-10-CM

## 2022-05-20 DIAGNOSIS — F329 Major depressive disorder, single episode, unspecified: Secondary | ICD-10-CM

## 2022-05-20 DIAGNOSIS — Z86010 Personal history of colonic polyps: Secondary | ICD-10-CM

## 2022-05-20 MED ORDER — NA SULFATE-K SULFATE-MG SULF 17.5-3.13-1.6 GM/177ML PO SOLN
1.0000 | Freq: Once | ORAL | 0 refills | Status: AC
Start: 2022-05-20 — End: 2022-05-20

## 2022-05-20 MED ORDER — LOSARTAN POTASSIUM 25 MG PO TABS: 50.0000 mg | ORAL_TABLET | Freq: Every day | ORAL | 0 refills | Status: DC

## 2022-05-20 MED ORDER — CITALOPRAM HYDROBROMIDE 40 MG PO TABS
40.0000 mg | ORAL_TABLET | Freq: Every day | ORAL | 0 refills | Status: DC
Start: 2022-05-20 — End: 2022-06-24

## 2022-05-20 NOTE — Progress Notes (Signed)
Pt's name and DOB verified at the beginning of the pre-visit.  Pt denies any difficulty with ambulating,sitting, laying down or rolling side to side Gave both [facility] main # and MD on call # prior to instructions.  No egg or soy allergy known to patient  No issues known to pt with past sedation with any surgeries or procedures Pt denies having issues being intubated Pt has no issues moving head neck or swallowing No FH of Malignant Hyperthermia Pt is not on diet pills Pt is not on home 02  Pt is not on blood thinners  Pt denies issues with constipation  Pt is not on dialysis Pt denise any abnormal heart rhythms  Pt denies any upcoming cardiac testing Pt encouraged to use to use Singlecare or Goodrx to reduce cost  Patient's chart reviewed by Cathlyn Parsons CNRA prior to pre-visit and patient appropriate for the [facility].  Pre-visit completed and red dot placed by patient's name on their procedure day (on provider's schedule).  . Visit by phone Pt states weight is 178lb Instructed pt why it is important to and  to call if they have any changes in health or new medications. Directed them to the # given and on instructions.   Pt states they will.  Instructions reviewed with pt and pt states understanding. Instructed to review again prior to procedure. Pt states they will.  Instructions sent by mail with coupon and by my chart

## 2022-05-20 NOTE — Telephone Encounter (Signed)
I have left the pt a VM stating that she needs an apt and that we sent in a 30 day w/ no refills

## 2022-06-09 ENCOUNTER — Encounter: Payer: Self-pay | Admitting: Certified Registered Nurse Anesthetist

## 2022-06-09 ENCOUNTER — Other Ambulatory Visit: Payer: Self-pay | Admitting: Family Medicine

## 2022-06-09 DIAGNOSIS — I10 Essential (primary) hypertension: Secondary | ICD-10-CM

## 2022-06-09 DIAGNOSIS — E119 Type 2 diabetes mellitus without complications: Secondary | ICD-10-CM

## 2022-06-12 ENCOUNTER — Ambulatory Visit (INDEPENDENT_AMBULATORY_CARE_PROVIDER_SITE_OTHER): Payer: Medicare Other | Admitting: Family Medicine

## 2022-06-12 ENCOUNTER — Encounter (INDEPENDENT_AMBULATORY_CARE_PROVIDER_SITE_OTHER): Payer: Self-pay | Admitting: Family Medicine

## 2022-06-12 VITALS — BP 136/70 | HR 63 | Temp 98.6°F | Ht 63.0 in | Wt 177.0 lb

## 2022-06-12 DIAGNOSIS — E039 Hypothyroidism, unspecified: Secondary | ICD-10-CM

## 2022-06-12 DIAGNOSIS — E559 Vitamin D deficiency, unspecified: Secondary | ICD-10-CM

## 2022-06-12 DIAGNOSIS — R7303 Prediabetes: Secondary | ICD-10-CM

## 2022-06-12 DIAGNOSIS — E7849 Other hyperlipidemia: Secondary | ICD-10-CM | POA: Diagnosis not present

## 2022-06-12 DIAGNOSIS — I10 Essential (primary) hypertension: Secondary | ICD-10-CM | POA: Diagnosis not present

## 2022-06-12 DIAGNOSIS — E669 Obesity, unspecified: Secondary | ICD-10-CM

## 2022-06-12 DIAGNOSIS — Z6831 Body mass index (BMI) 31.0-31.9, adult: Secondary | ICD-10-CM | POA: Diagnosis not present

## 2022-06-12 DIAGNOSIS — F3289 Other specified depressive episodes: Secondary | ICD-10-CM | POA: Diagnosis not present

## 2022-06-13 LAB — CBC WITH DIFFERENTIAL/PLATELET
Basophils Absolute: 0 10*3/uL (ref 0.0–0.2)
Basos: 1 %
EOS (ABSOLUTE): 0.1 10*3/uL (ref 0.0–0.4)
Eos: 2 %
Hematocrit: 40.5 % (ref 34.0–46.6)
Hemoglobin: 13.6 g/dL (ref 11.1–15.9)
Immature Grans (Abs): 0 10*3/uL (ref 0.0–0.1)
Immature Granulocytes: 0 %
Lymphocytes Absolute: 0.8 10*3/uL (ref 0.7–3.1)
Lymphs: 18 %
MCH: 30.4 pg (ref 26.6–33.0)
MCHC: 33.6 g/dL (ref 31.5–35.7)
MCV: 90 fL (ref 79–97)
Monocytes Absolute: 0.5 10*3/uL (ref 0.1–0.9)
Monocytes: 11 %
Neutrophils Absolute: 3.3 10*3/uL (ref 1.4–7.0)
Neutrophils: 68 %
Platelets: 314 10*3/uL (ref 150–450)
RBC: 4.48 x10E6/uL (ref 3.77–5.28)
RDW: 11.9 % (ref 11.7–15.4)
WBC: 4.7 10*3/uL (ref 3.4–10.8)

## 2022-06-13 LAB — CMP14+EGFR
ALT: 18 IU/L (ref 0–32)
AST: 19 IU/L (ref 0–40)
Albumin/Globulin Ratio: 1.8 (ref 1.2–2.2)
Albumin: 4.6 g/dL (ref 3.8–4.8)
Alkaline Phosphatase: 99 IU/L (ref 44–121)
BUN/Creatinine Ratio: 22 (ref 12–28)
BUN: 17 mg/dL (ref 8–27)
Bilirubin Total: 0.6 mg/dL (ref 0.0–1.2)
CO2: 26 mmol/L (ref 20–29)
Calcium: 10.2 mg/dL (ref 8.7–10.3)
Chloride: 99 mmol/L (ref 96–106)
Creatinine, Ser: 0.76 mg/dL (ref 0.57–1.00)
Globulin, Total: 2.5 g/dL (ref 1.5–4.5)
Glucose: 79 mg/dL (ref 70–99)
Potassium: 5.1 mmol/L (ref 3.5–5.2)
Sodium: 138 mmol/L (ref 134–144)
Total Protein: 7.1 g/dL (ref 6.0–8.5)
eGFR: 84 mL/min/{1.73_m2} (ref >59)

## 2022-06-13 LAB — VITAMIN D 25 HYDROXY (VIT D DEFICIENCY, FRACTURES): Vit D, 25-Hydroxy: 40.8 ng/mL (ref 30.0–100.0)

## 2022-06-13 LAB — HEMOGLOBIN A1C
Est. average glucose Bld gHb Est-mCnc: 111 mg/dL
Hgb A1c MFr Bld: 5.5 % (ref 4.8–5.6)

## 2022-06-13 LAB — LIPID PANEL WITH LDL/HDL RATIO
Cholesterol, Total: 237 mg/dL — ABNORMAL HIGH (ref 100–199)
HDL: 56 mg/dL (ref >39)
LDL Chol Calc (NIH): 144 mg/dL — ABNORMAL HIGH (ref 0–99)
LDL/HDL Ratio: 2.6 ratio (ref 0.0–3.2)
Triglycerides: 208 mg/dL — ABNORMAL HIGH (ref 0–149)
VLDL Cholesterol Cal: 37 mg/dL (ref 5–40)

## 2022-06-13 LAB — VITAMIN B12: Vitamin B-12: 991 pg/mL (ref 232–1245)

## 2022-06-13 LAB — INSULIN, RANDOM: INSULIN: 8 u[IU]/mL (ref 2.6–24.9)

## 2022-06-13 LAB — TSH: TSH: 2.57 u[IU]/mL (ref 0.450–4.500)

## 2022-06-17 ENCOUNTER — Ambulatory Visit (AMBULATORY_SURGERY_CENTER): Payer: Medicare Other | Admitting: Internal Medicine

## 2022-06-17 ENCOUNTER — Encounter: Payer: Self-pay | Admitting: Internal Medicine

## 2022-06-17 VITALS — BP 147/62 | HR 57 | Temp 98.0°F | Resp 13 | Ht 63.0 in | Wt 178.0 lb

## 2022-06-17 DIAGNOSIS — Z09 Encounter for follow-up examination after completed treatment for conditions other than malignant neoplasm: Secondary | ICD-10-CM

## 2022-06-17 DIAGNOSIS — D125 Benign neoplasm of sigmoid colon: Secondary | ICD-10-CM

## 2022-06-17 DIAGNOSIS — G4733 Obstructive sleep apnea (adult) (pediatric): Secondary | ICD-10-CM | POA: Diagnosis not present

## 2022-06-17 DIAGNOSIS — Z86010 Personal history of colonic polyps: Secondary | ICD-10-CM

## 2022-06-17 DIAGNOSIS — D123 Benign neoplasm of transverse colon: Secondary | ICD-10-CM | POA: Diagnosis not present

## 2022-06-17 DIAGNOSIS — K635 Polyp of colon: Secondary | ICD-10-CM | POA: Diagnosis not present

## 2022-06-17 MED ORDER — SODIUM CHLORIDE 0.9 % IV SOLN
500.0000 mL | Freq: Once | INTRAVENOUS | Status: DC
Start: 2022-06-17 — End: 2022-06-17

## 2022-06-17 NOTE — Progress Notes (Signed)
Pt's states no medical or surgical changes since previsit or office visit. 

## 2022-06-17 NOTE — Op Note (Signed)
[facility] Patient Name: Jennifer [name] Procedure Date: 06/17/2022 9:11 AM MRN: [id number] Endoscopist: Iva Boop , MD, [id number] Age: 72 Referring MD:  Date of Birth: 28-Apr-1950 Gender: Female Account #: [id number] Procedure:                Colonoscopy Indications:              Surveillance: Personal history of adenomatous                            polyps on last colonoscopy 3 years ago Medicines:                Monitored Anesthesia Care Procedure:                Pre-Anesthesia Assessment:                           - Prior to the procedure, a History and Physical                            was performed, and patient medications and                            allergies were reviewed. The patient's tolerance of                            previous anesthesia was also reviewed. The risks                            and benefits of the procedure and the sedation                            options and risks were discussed with the patient.                            All questions were answered, and informed consent                            was obtained. Prior Anticoagulants: The patient has                            taken no anticoagulant or antiplatelet agents. ASA                            Grade Assessment: III - A patient with severe                            systemic disease. After reviewing the risks and                            benefits, the patient was deemed in satisfactory                            condition to undergo the procedure.  After obtaining informed consent, the colonoscope                            was passed under direct vision. Throughout the                            procedure, the patient's blood pressure, pulse, and                            oxygen saturations were monitored continuously. The                            PCF-HQ190L Colonoscope [id number] was introduced                            through the anus  and advanced to the the cecum,                            identified by appendiceal orifice and ileocecal                            valve. The colonoscopy was somewhat difficult due                            to a redundant colon and significant looping.                            Successful completion of the procedure was aided by                            straightening and shortening the scope to obtain                            bowel loop reduction and applying abdominal                            pressure. The patient tolerated the procedure well.                            The quality of the bowel preparation was good. The                            bowel preparation used was Miralax via extended                            prep with split dose instruction. The bowel                            preparation used was SUPREP via extended prep with                            split dose instruction. Scope In: 9:16:21 AM Scope Out: 9:39:37 AM Scope Withdrawal Time: 0 hours 13 minutes 37 seconds  Total Procedure Duration: 0 hours 23 minutes 16 seconds  Findings:                 The perianal and digital rectal examinations were                            normal.                           Two sessile polyps were found in the sigmoid colon                            and transverse colon. The polyps were diminutive in                            size. These polyps were removed with a cold snare.                            Resection and retrieval were complete. Verification                            of patient identification for the specimen was                            done. Estimated blood loss was minimal.                           The exam was otherwise without abnormality on                            direct and retroflexion views. Complications:            No immediate complications. Estimated Blood Loss:     Estimated blood loss was minimal. Impression:               - Two diminutive  polyps in the sigmoid colon and in                            the transverse colon, removed with a cold snare.                            Resected and retrieved.                           - The examination was otherwise normal on direct                            and retroflexion views.                           - Personal history of colonic polyps. 2004 single                            adenoma  2008 3 adenomas                           2012 - small adenoma - sigmoid                           2013 - hyperplastic polyp                           03/29/2014 incomplete prep                           05/09/2014 no polyps                           04/29/2019 5 diminutive polyps adenomas Recommendation:           - Patient has a contact number available for                            emergencies. The signs and symptoms of potential                            delayed complications were discussed with the                            patient. Return to normal activities tomorrow.                            Written discharge instructions were provided to the                            patient.                           - Resume previous diet.                           - Continue present medications.                           - Repeat colonoscopy is recommended. The                            colonoscopy date will be determined after pathology                            results from today's exam become available for                            review. Iva Boop, MD 06/17/2022 9:46:37 AM This report has been signed electronically.

## 2022-06-17 NOTE — Progress Notes (Signed)
Chief Complaint:   OBESITY Jennifer [name] is here to discuss her progress with her obesity treatment plan along with follow-up of her obesity related diagnoses. [name] is on the Category 1 Plan and states she is following her eating plan approximately 50% of the time. [name] states she is doing 0 minutes 0 times per week.  Today's visit was #: 50 Starting weight: 208 lbs Starting date: 08/25/2018 Today's weight: 177 lbs Today's date: 06/22/2022 Total lbs lost to date: 31 Total lbs lost since last in-office visit: 0  Interim History: Patient's last visit was approximately 2 months ago.  She has done some traveling and eating out.  She continues to work on [employer].  Subjective:   1. Other depression Patient notes her mood has decreased.  She notes decreased motivation to be productive and decreased enjoyment in things she used to.  2. Essential hypertension Patient's blood pressure is controlled, and she continues to work on her diet and exercise to control.  3. Other hyperlipidemia Patient is on pravastatin.  She has decreased cholesterol in her diet.  She is due for labs.  4. Vitamin D deficiency Patient is on vitamin D 2000 IU daily.  She is not at high risk of over replacement.  5. Acquired hypothyroidism Patient is on Synthroid 50 mcg, and she continues to work on her weight loss.  Due to this her dose may need to be adjusted.  6. Prediabetes Patient is decreasing simple carbohydrates, and she is working on healthier eating.  She is due for labs.  Assessment/Plan:   1. Other depression Patient will continue Celexa, and we will refer to Dr. Dewaine Conger, our bariatric psychologist for evaluation.  2. Essential hypertension We will check labs today.  Patient will continue with her diet and exercise.  - CBC with Differential/Platelet - CMP14+EGFR  3. Other hyperlipidemia We will check labs today, and we will follow-up at her next visit.  - Lipid Panel With LDL/HDL  Ratio  4. Vitamin D deficiency We will check labs today, and we will follow-up at her next visit.  Patient will continue vitamin D 2000 IU daily OTC.  - VITAMIN D 25 Hydroxy (Vit-D Deficiency, Fractures)  5. Acquired hypothyroidism We will check labs today.  Patient will continue Synthroid, and we will follow-up at her next visit.  - TSH  6. Prediabetes We will check labs today, and we will follow-up at her next visit.  - Vitamin B12 - Insulin, random - Hemoglobin A1c  7. BMI 31.0-31.9,adult  8. Obesity, Beginning BMI 36.8 [name] is currently in the action stage of change. As such, her goal is to continue with weight loss efforts. She has agreed to the Category 1 Plan.   Behavioral modification strategies: travel eating strategies.  [name] has agreed to follow-up with our clinic in 3 to 4 weeks. She was informed of the importance of frequent follow-up visits to maximize her success with intensive lifestyle modifications for her multiple health conditions.   Barri was informed we would discuss her lab results at her next visit unless there is a critical issue that needs to be addressed sooner. [name] agreed to keep her next visit at the agreed upon time to discuss these results.  Objective:   Blood pressure 136/70, pulse 63, temperature 98.6 F (37 C), height 5\' 3"  (1.6 m), weight 177 lb (80.3 kg), SpO2 93 %. Body mass index is 31.35 kg/m.  Lab Results  Component Value Date   CREATININE 0.76 06/12/2022  BUN 17 06/12/2022   NA 138 06/12/2022   K 5.1 06/12/2022   CL 99 06/12/2022   CO2 26 06/12/2022   Lab Results  Component Value Date   ALT 18 06/12/2022   AST 19 06/12/2022   ALKPHOS 99 06/12/2022   BILITOT 0.6 06/12/2022   Lab Results  Component Value Date   HGBA1C 5.5 06/12/2022   HGBA1C 5.3 12/17/2021   HGBA1C 5.2 06/06/2021   HGBA1C 5.0 12/25/2020   HGBA1C 5.2 01/17/2020   Lab Results  Component Value Date   INSULIN 8.0 06/12/2022   INSULIN 6.8  12/17/2021   INSULIN 9.4 06/06/2021   INSULIN 6.8 12/25/2020   INSULIN 4.7 01/17/2020   Lab Results  Component Value Date   TSH 2.570 06/12/2022   Lab Results  Component Value Date   CHOL 237 (H) 06/12/2022   HDL 56 06/12/2022   LDLCALC 144 (H) 06/12/2022   LDLDIRECT 96.0 05/04/2018   TRIG 208 (H) 06/12/2022   CHOLHDL 3.8 12/25/2020   Lab Results  Component Value Date   VD25OH 40.8 06/12/2022   VD25OH 45.9 12/17/2021   VD25OH 56.0 06/06/2021   Lab Results  Component Value Date   WBC 4.7 06/12/2022   HGB 13.6 06/12/2022   HCT 40.5 06/12/2022   MCV 90 06/12/2022   PLT 314 06/12/2022   Lab Results  Component Value Date   IRON 110 11/01/2019   TIBC 382 11/01/2019   Attestation Statements:   Reviewed by clinician on day of visit: allergies, medications, problem list, medical history, surgical history, family history, social history, and previous encounter notes.  Time spent on visit including pre-visit chart review and post-visit care and charting was 40 minutes.   I, Burt Knack, am acting as transcriptionist for Quillian Quince, MD.  I have reviewed the above documentation for accuracy and completeness, and I agree with the above. -  Quillian Quince, MD

## 2022-06-17 NOTE — Progress Notes (Signed)
[facility] History and Physical   Primary Care Physician:  Shade Flood, MD   Reason for Procedure:   Hx colon polyps  Plan:    colonoscopy     HPI: Jennifer [name] is a 72 y.o. female for surveillance colonoscopy exam (polyps)  2004 single adenoma 2008 3 adenomas 2012 - small adenoma - sigmoid 2013 - hyperplastic polyp 03/29/2014 incomplete prep 05/09/2014 no polyps 04/29/2019 5 diminutive polyps adenomas Past Medical History:  Diagnosis Date   Allergy    Anxiety    Constipation    Depression    Dysrhythmia    FLB - ? PAC'S COMES AND GOES   Gallbladder problem    GERD (gastroesophageal reflux disease)    High cholesterol    Hx of adenomatous polyp of colon    2012 - [place]   Hyperlipidemia    Hypertension    Hypothyroid    Joint pain    no current problems   Osteoarthritis    PLMD (periodic limb movement disorder) 04/24/2012   Prediabetes    Seizures (HCC) 08/29/2013   only 1 seizure episode   Sleep apnea    wears CPAP nightly   Vitamin D deficiency     Past Surgical History:  Procedure Laterality Date   APPENDECTOMY     CESAREAN SECTION      x1   CHOLECYSTECTOMY  1978   COLONOSCOPY     DILATION AND CURETTAGE OF UTERUS     JOINT REPLACEMENT  1998   right knee   OSTEOTOMY PROXIMAL FEMORAL     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 Authorizing Provider  Biotin 1000 MCG tablet Take 1,000 mcg by mouth daily.    [provider]  CALCIUM CARBONATE PO Take 600 mg by mouth daily.     [provider]  cetirizine (ZYRTEC) 10 MG tablet  Take 10 mg by mouth every evening.     [provider]  Cholecalciferol (VITAMIN D) 50 MCG (2000 UT) tablet Take 2,000 Units by mouth daily.    [provider]  citalopram (CELEXA) 40 MG tablet Take 1 tablet (40 mg total) by mouth daily. 05/20/22   Shade Flood, MD  hydroxypropyl methylcellulose / hypromellose (ISOPTO TEARS / GONIOVISC) 2.5 % ophthalmic solution Place 1 drop into both eyes 2 (two) times daily as needed for dry eyes.    [provider]  levETIRAcetam (KEPPRA) 500 MG tablet Take 1 tablet (500 mg total) by mouth 2 (two) times daily. 09/07/21   Van Clines, MD  levothyroxine (SYNTHROID) 50 MCG tablet TAKE 1 TABLET BY MOUTH EVERY DAY 02/20/22   Shade Flood, MD  losartan (COZAAR) 25 MG tablet TAKE 2 TABLETS BY MOUTH EVERY DAY 06/10/22   Shade Flood, MD  Multiple Vitamins-Minerals (MULTIPLE VITAMINS/WOMENS PO) Take 1 tablet by mouth daily.     [provider]  Na Sulfate-K Sulfate-Mg Sulf 17.5-3.13-1.6 GM/177ML SOLN See admin instructions. 05/20/22   [provider]  pravastatin (PRAVACHOL) 80 MG tablet Take 1 tablet (80 mg total) by mouth daily. 12/10/21   Shade Flood, MD  Current Outpatient Medications  Medication Sig Dispense Refill   Biotin 1000 MCG tablet Take 1,000 mcg by mouth daily.     CALCIUM CARBONATE PO Take 600 mg by mouth daily.      cetirizine (ZYRTEC) 10 MG tablet Take 10 mg by mouth every evening.      Cholecalciferol (VITAMIN D) 50 MCG (2000 UT) tablet Take 2,000 Units by mouth daily.     citalopram (CELEXA) 40 MG tablet Take 1 tablet (40 mg total) by mouth daily. 30 tablet 0   hydroxypropyl methylcellulose / hypromellose (ISOPTO TEARS / GONIOVISC) 2.5 % ophthalmic solution Place 1 drop into both eyes 2 (two) times daily as needed for dry eyes.     levETIRAcetam (KEPPRA) 500 MG tablet Take 1 tablet (500 mg total) by mouth 2 (two) times daily. 180 tablet 3   levothyroxine (SYNTHROID) 50 MCG tablet TAKE 1  TABLET BY MOUTH EVERY DAY 90 tablet 2   losartan (COZAAR) 25 MG tablet TAKE 2 TABLETS BY MOUTH EVERY DAY 180 tablet 1   Multiple Vitamins-Minerals (MULTIPLE VITAMINS/WOMENS PO) Take 1 tablet by mouth daily.      Na Sulfate-K Sulfate-Mg Sulf 17.5-3.13-1.6 GM/177ML SOLN See admin instructions.     pravastatin (PRAVACHOL) 80 MG tablet Take 1 tablet (80 mg total) by mouth daily. 90 tablet 3   Current Facility-Administered Medications  Medication Dose Route Frequency Provider Last Rate Last Admin   0.9 %  sodium chloride infusion  500 mL Intravenous Once Iva Boop, MD        Allergies as of 06/17/2022 - Review Complete 06/17/2022  Allergen Reaction Noted   Crestor [rosuvastatin] Other (See Comments) 08/29/2013   Neomycin-bacitracin zn-polymyx Swelling    Neomycin-bacitracin-polymyxin [bacitracin-neomycin-polymyxin] Swelling 08/29/2013   Codeine Rash 08/29/2013   Feldene [piroxicam] Rash 08/29/2013   Penicillins Rash 08/29/2013   Piroxicam Rash    Sulfa antibiotics Rash 08/29/2013   Sulfonamide derivatives Rash     Family History  Problem Relation Age of Onset   Heart disease Mother 7       Aortic valve disease   Diabetes Mother    Hypertension Mother    Thyroid disease Mother    Cancer Father 39   Esophageal cancer Father    Alcoholism Father    Sleep apnea Sister    Sleep apnea Sister    Atrial fibrillation Brother    Sleep apnea Brother    Stomach cancer Maternal Grandmother    Colon cancer Neg Hx    Rectal cancer Neg Hx    Colon polyps Neg Hx     Social History   Socioeconomic History   Marital status: Divorced    Spouse name: Not on file   Number of children: 1   Years of education: Not on file   Highest education level: Not on file  Occupational History   Occupation: Retired    Associate Professor: [employer]  Tobacco Use   Smoking status: Never   Smokeless tobacco: Never  Vaping Use   Vaping Use: Never used  Substance and Sexual Activity   Alcohol use: No     Alcohol/week: 0.0 standard drinks of alcohol   Drug use: No   Sexual activity: Never    Birth control/protection: Post-menopausal  Other Topics Concern   Not on file  Social History Narrative   Lives with blind dog and cat.    Right handed    Social Determinants of Health   Financial Resource Strain: Low Risk  (11/07/2021)  Overall Financial Resource Strain (CARDIA)    Difficulty of Paying Living Expenses: Not hard at all  Food Insecurity: No Food Insecurity (11/07/2021)   Hunger Vital Sign    Worried About Running Out of Food in the Last Year: Never true    Ran Out of Food in the Last Year: Never true  Transportation Needs: No Transportation Needs (11/07/2021)   PRAPARE - Administrator, Civil Service (Medical): No    Lack of Transportation (Non-Medical): No  Physical Activity: Inactive (11/07/2021)   Exercise Vital Sign    Days of Exercise per Week: 0 days    Minutes of Exercise per Session: 0 min  Stress: No Stress Concern Present (11/07/2021)   [employer] of Occupational Health - Occupational Stress Questionnaire    Feeling of Stress : Not at all  Social Connections: Moderately Integrated (11/07/2021)   Social Connection and Isolation Panel [NHANES]    Frequency of Communication with Friends and Family: More than three times a week    Frequency of Social Gatherings with Friends and Family: More than three times a week    Attends Religious Services: More than 4 times per year    Active Member of [employer] or Organizations: Yes    Attends Engineer, structural: More than 4 times per year    Marital Status: Divorced  Intimate Partner Violence: Not At Risk (11/07/2021)   Humiliation, Afraid, Rape, and Kick questionnaire    Fear of Current or Ex-Partner: No    Emotionally Abused: No    Physically Abused: No    Sexually Abused: No    Review of Systems:  All other review of systems negative except as mentioned in the HPI.  Physical Exam: Vital  signs BP 132/76   Pulse 64   Temp 98 F (36.7 C) (Temporal)   Ht 5\' 3"  (1.6 m)   Wt 178 lb (80.7 kg)   LMP  (LMP Unknown)   SpO2 93%   BMI 31.53 kg/m   General:   Alert,  Well-developed, well-nourished, pleasant and cooperative in NAD Lungs:  Clear throughout to auscultation.   Heart:  Regular rate and rhythm; no murmurs, clicks, rubs,  or gallops. Abdomen:  Soft, nontender and nondistended. Normal bowel sounds.   Neuro/Psych:  Alert and cooperative. Normal mood and affect. A and O x 3   @Zasha Belleau  Sena Slate, MD, [facility] [phone] (pager) 06/17/2022 9:06 AM@

## 2022-06-17 NOTE — Progress Notes (Signed)
Called to room to assist during endoscopic procedure.  Patient ID and intended procedure confirmed with present staff. Received instructions for my participation in the procedure from the performing physician.  

## 2022-06-17 NOTE — Patient Instructions (Addendum)
Two (2) tiny polyps removed.  I will let you know pathology results and when to have another routine colonoscopy by mail and/or My Chart.  I appreciate the opportunity to care for you. Iva Boop, MD, FACG   YOU HAD AN ENDOSCOPIC PROCEDURE TODAY AT THE [facility]:   Refer to the procedure report that was given to you for any specific questions about what was found during the examination.  If the procedure report does not answer your questions, please call your gastroenterologist to clarify.  If you requested that your care partner not be given the details of your procedure findings, then the procedure report has been included in a sealed envelope for you to review at your convenience later.  YOU SHOULD EXPECT: Some feelings of bloating in the abdomen. Passage of more gas than usual.  Walking can help get rid of the air that was put into your GI tract during the procedure and reduce the bloating. If you had a lower endoscopy (such as a colonoscopy or flexible sigmoidoscopy) you may notice spotting of blood in your stool or on the toilet paper. If you underwent a bowel prep for your procedure, you may not have a normal bowel movement for a few days.  Please Note:  You might notice some irritation and congestion in your nose or some drainage.  This is from the oxygen used during your procedure.  There is no need for concern and it should clear up in a day or so.  SYMPTOMS TO REPORT IMMEDIATELY:  Following lower endoscopy (colonoscopy or flexible sigmoidoscopy):  Excessive amounts of blood in the stool  Significant tenderness or worsening of abdominal pains  Swelling of the abdomen that is new, acute  Fever of 100F or higher  For urgent or emergent issues, a gastroenterologist can be reached at any hour by calling [phone]. Do not use MyChart messaging for urgent concerns.    DIET:  We do recommend a small meal at first, but then you may proceed to your regular diet.   Drink plenty of fluids but you should avoid alcoholic beverages for 24 hours.  ACTIVITY:  You should plan to take it easy for the rest of today and you should NOT DRIVE or use heavy machinery until tomorrow (because of the sedation medicines used during the test).    FOLLOW UP: Our staff will call the number listed on your records the next business day following your procedure.  We will call around 7:15- 8:00 am to check on you and address any questions or concerns that you may have regarding the information given to you following your procedure. If we do not reach you, we will leave a message.     If any biopsies were taken you will be contacted by phone or by letter within the next 1-3 weeks.  Please call us at [phone] if you have not heard about the biopsies in 3 weeks.    SIGNATURES/CONFIDENTIALITY: You and/or your care partner have signed paperwork which will be entered into your electronic medical record.  These signatures attest to the fact that that the information above on your After Visit Summary has been reviewed and is understood.  Full responsibility of the confidentiality of this discharge information lies with you and/or your care-partner.

## 2022-06-18 ENCOUNTER — Telehealth: Payer: Self-pay

## 2022-06-18 NOTE — Telephone Encounter (Signed)
Left message on answering machine. 

## 2022-06-24 ENCOUNTER — Ambulatory Visit (INDEPENDENT_AMBULATORY_CARE_PROVIDER_SITE_OTHER): Payer: Medicare Other | Admitting: Family Medicine

## 2022-06-24 VITALS — BP 128/72 | HR 65 | Temp 97.8°F | Ht 63.0 in | Wt 184.4 lb

## 2022-06-24 DIAGNOSIS — R7303 Prediabetes: Secondary | ICD-10-CM | POA: Diagnosis not present

## 2022-06-24 DIAGNOSIS — E039 Hypothyroidism, unspecified: Secondary | ICD-10-CM | POA: Diagnosis not present

## 2022-06-24 DIAGNOSIS — E7849 Other hyperlipidemia: Secondary | ICD-10-CM | POA: Diagnosis not present

## 2022-06-24 DIAGNOSIS — F329 Major depressive disorder, single episode, unspecified: Secondary | ICD-10-CM

## 2022-06-24 DIAGNOSIS — L918 Other hypertrophic disorders of the skin: Secondary | ICD-10-CM

## 2022-06-24 DIAGNOSIS — F3289 Other specified depressive episodes: Secondary | ICD-10-CM

## 2022-06-24 DIAGNOSIS — I10 Essential (primary) hypertension: Secondary | ICD-10-CM | POA: Diagnosis not present

## 2022-06-24 MED ORDER — CITALOPRAM HYDROBROMIDE 40 MG PO TABS: 40.0000 mg | ORAL_TABLET | Freq: Every day | ORAL | 0 refills | Status: DC

## 2022-06-24 MED ORDER — EZETIMIBE 10 MG PO TABS
10.0000 mg | ORAL_TABLET | Freq: Every day | ORAL | 3 refills | Status: DC
Start: 2022-06-24 — End: 2023-06-16

## 2022-06-24 NOTE — Patient Instructions (Addendum)
[facility]acc[facility] updates at [facility]. There is a recommendation for RSV vaccine for patients over age 71.   Typically would recommend the RSV vaccine as most important for patients over age 6 that have comorbidities that put them at increased risk for severe disease (heart disease such as congestive heart failure, coronary artery disease, lung disease such as asthma or COPD, kidney disease, liver disease, diabetes, chronic or progressive neurologic or muscular conditions, immunosuppressed, or being frail or of advanced age).  For others that do not have these risk factors, there still is some benefit from vaccination since age is one of the main risk factors for developing severe disease however baseline risk of developing severe disease and requiring hospitalization is likely to be lower compared to those that have comorbidities in addition to age.   CDC does have some information as well: ToyProtection.fi  If the lesion on the thigh does not resolve with this treatment today, I do recommend following up with your dermatologist as it may need to be excised.  If any increasing redness, discharge, signs of infection or worsening symptoms be seen but I do not expect that to occur.  Try Zetia once per day in addition to your pravastatin.  That can lower the LDL and should be tolerated well.  Should recheck cholesterol levels in the next few months, that can be done at [facility] or let me know if I need to place an order.  No other med changes at this time.  Let me know if there are questions and take care!  Cryoablation, Care After The following information offers guidance on how to care for yourself after your procedure. Your health care provider may also give you more specific instructions. If you have problems or questions, contact your health care provider. What can I expect after the procedure? After the procedure, it is common to have: Redness or  blisters near the area treated. Mild pain and swelling. Follow these instructions at home: Treatment area care  If you have an incision, follow instructions from your health care provider about how to take care of it. Make sure you: Wash your hands with soap and water for at least 20 seconds before and after you change your bandage (dressing). If soap and water are not available, use hand sanitizer. Change your dressing as told by your health care provider. Leave stitches (sutures), skin glue, or adhesive strips in place. These skin closures may need to stay in place for 2 weeks or longer. If adhesive strip edges start to loosen and curl up, you may trim the loose edges. Do not remove adhesive strips completely unless your health care provider tells you to do that. Check your treatment area every day for signs of infection. Check for: More redness, swelling, or pain. Fluid or blood. Warmth. Pus or a bad smell. Keep the treated area clean and dry. Keep it covered with a dressing until it has healed. Clean the area with soap and water as told by your health care provider. If your dressing gets wet, change it right away. Activity  Follow instructions from your health care provider about what activities are safe for you. You may have to avoid lifting. Ask your health care provider how much you can safely lift. If you were given a sedative during the procedure, it can affect you for several hours. Do not drive or operate machinery until your health care provider says that it is safe. General instructions Take over-the-counter and prescription medicines  only as told by your health care provider. Do not use any products that contain nicotine or tobacco. These products include cigarettes, chewing tobacco, and vaping devices, such as e-cigarettes. These can delay incision healing. If you need help quitting, ask your health care provider. Do not take baths, swim, or use a hot tub until your health care  provider approves. Ask your health care provider if you may take showers. You may only be allowed to take sponge baths. Keep all follow-up visits. Your health care provider may need to check that treatment worked and that there were no problems caused by the procedure. Contact a health care provider if: You have more pain. You have a fever. You have nausea or vomiting. You have any signs of infection. You do not have a bowel movement for 2 days. You cannot urinate, or you cannot control when you urinate or have a bowel movement (have incontinence). You develop impotence. Get help right away if: You have severe pain. You have trouble swallowing or breathing. You are very weak or dizzy. You have chest pain or shortness of breath. These symptoms may be an emergency. Get help right away. Call 911. Do not wait to see if the symptoms will go away. Do not drive yourself to the hospital. This information is not intended to replace advice given to you by your health care provider. Make sure you discuss any questions you have with your health care provider. Document Revised: 06/08/2021 Document Reviewed: 06/08/2021 Elsevier Patient Education  2024 [employer].

## 2022-06-24 NOTE — Progress Notes (Unsigned)
Subjective:  Patient ID: Jennifer [name], female    DOB: 1950-04-22  Age: 72 y.o. MRN: [id number]  CC:  Chief Complaint  Patient presents with   Medical Management of Chronic Issues    Recent labs done 2 weeks ago, would like some additional pointers in how to better her cholesterol, pt saw derm for mole removal and had treatment notes mole did not fall off wondered if we could treat it once more to see if it would finally resolve.      HPI Jennifer [name] presents for   Hyperlipidemia: Myalgias with Crestor previously.  Currently taking pravastatin 80 mg daily - no side effects. Recent labs on June 5 with LDL 144.  She is followed by [facility], Dr. Dalbert Garnet with recent visit. Lab Results  Component Value Date   CHOL 237 (H) 06/12/2022   HDL 56 06/12/2022   LDLCALC 144 (H) 06/12/2022   LDLDIRECT 96.0 05/04/2018   TRIG 208 (H) 06/12/2022   CHOLHDL 3.8 12/25/2020   Lab Results  Component Value Date   ALT 18 06/12/2022   AST 19 06/12/2022   ALKPHOS 99 06/12/2022   BILITOT 0.6 06/12/2022   Prediabetes: History of prediabetes, most recent A1c normal range. Followed by weight management specialist.  Lab Results  Component Value Date   HGBA1C 5.5 06/12/2022   Wt Readings from Last 3 Encounters:  06/24/22 184 lb 6.4 oz (83.6 kg)  06/17/22 178 lb (80.7 kg)  06/12/22 177 lb (80.3 kg)   Hypothyroidism: Lab Results  Component Value Date   TSH 2.570 06/12/2022  Taking medication daily.  Synthroid 50 mcg, recent labs noted above. No new hot or cold intolerance. No new hair or skin changes, heart palpitations or new fatigue. No new weight changes. No new symptoms.   Hypertension: Losartan 25 mg daily Home readings: none - elevated with plan for weight check at last visit.  BP Readings from Last 3 Encounters:  06/24/22 128/72  06/17/22 (!) 147/62  06/12/22 136/70   Lab Results  Component Value Date   CREATININE 0.76 06/12/2022    Nevus Previously treated by dermatology, note reviewed from April 11.Dermatology specialists, Lowanda Foster Axner.  Seborrheic keratosis, lentigo, melanocytic nevi of trunk, inflamed seborrheic keratosis, angiomas skin, sun damaged skin, and nevus of left foot. Cryotherapy in April to R thigh, skin tag. No changes. Told may remain.    Depression: Treated with Celexa.  Down at times, was able to exercise for a while, that has been helpful previously.  Has discussed with Dr. Dalbert Garnet as well of getting back into some form of exercise.  Feels like this will be helpful.  Denies SI, HI or need for med changes at this time.     06/24/2022    1:05 PM 11/07/2021   11:37 AM 03/12/2021   11:39 AM 10/12/2020    9:36 AM 10/12/2020    9:24 AM  Depression screen PHQ 2/9  Decreased Interest 1 0 0 1 1  Down, Depressed, Hopeless 0 0 0 0 0  PHQ - 2 Score 1 0 0 1 1  Altered sleeping 1  0 1 1  Tired, decreased energy 3  0 1 1  Change in appetite 1  0 0 0  Feeling bad or failure about yourself  1  1 0 0  Trouble concentrating 1  0 0 0  Moving slowly or fidgety/restless 0  0 0 0  Suicidal thoughts 0  0 0 0  PHQ-9 Score  8  1 3 3   Difficult doing work/chores    Not difficult at all Not difficult at all     Followed by neurology with history of seizure, Dr. Karel Jarvis treated with Keppra 500 mg twice daily. No recent seizures.   Health maintenance Colonoscopy 06/17/2022 Tetanus discussed at her pharmacy, COVID and flu vaccines this fall. [facility] as option. [facility]     History Patient Active Problem List   Diagnosis Date Noted   Other hyperlipidemia 06/12/2022   Stress 04/17/2022   BMI 31.0-31.9,adult 02/20/2022   Obesity, Beginning BMI 36.8 02/20/2022   Hypothyroidism 12/17/2021   Mouth abscess 12/17/2021   Hyperglycemia 12/17/2021   Pain of right hip 10/01/2021   Mixed hyperlipidemia 06/06/2021   Insulin resistance 06/06/2021   Vitamin D deficiency 05/07/2021   Elevated LFTs 11/01/2019    Abrasion of left buttock 08/05/2018   S/P total knee arthroplasty, left 07/20/2018   Degenerative arthritis of left knee 07/17/2018   Chronic constipation 12/20/2017   Class 2 severe obesity with serious comorbidity and body mass index (BMI) of 36.0 to 36.9 in adult [facility]) 12/20/2017   Primary osteoarthritis of right hip 10/20/2015   Arthritis of right hip 10/20/2015   Localization-related idiopathic epilepsy and epileptic syndromes with seizures of localized onset, not intractable, without status epilepticus (HCC) 09/09/2014   Depression 07/19/2014   Hx of adenomatous polyp of colon 03/29/2014   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 PAC'S COMES AND GOES   Gallbladder problem    GERD (gastroesophageal reflux disease)    High cholesterol    Hx of adenomatous polyp of colon    2012 - [place]   Hyperlipidemia    Hypertension    Hypothyroid    Joint pain    no current problems   Osteoarthritis    PLMD (periodic limb movement disorder) 04/24/2012   Prediabetes    Seizures (HCC) 08/29/2013   only 1 seizure episode   Sleep apnea    wears CPAP nightly   Vitamin D deficiency    Past Surgical History:  Procedure Laterality Date   APPENDECTOMY     CESAREAN SECTION      x1   CHOLECYSTECTOMY  1978   COLONOSCOPY     DILATION AND CURETTAGE OF UTERUS     JOINT REPLACEMENT  1998   right knee   OSTEOTOMY PROXIMAL FEMORAL     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   Procedure: TOTAL HIP ARTHROPLASTY ANTERIOR APPROACH;  Surgeon: Gean Birchwood, MD;  Location: [facility] OR;  Service: Orthopedics;  Laterality:  Right;   TOTAL KNEE ARTHROPLASTY Left 07/20/2018   Procedure: Left Knee Arthroplasty;  Surgeon: Gean Birchwood, MD;  Location: [facility];  Service: Orthopedics;  Laterality: Left;   Allergies  Allergen Reactions   Crestor [Rosuvastatin] Other (See Comments)    Muscle aches   Neomycin-Bacitracin Zn-Polymyx Swelling    OPTHALMIC EXPOSURE ONLY - - SWELLING EYES    Neomycin-Bacitracin-Polymyxin [Bacitracin-Neomycin-Polymyxin] Swelling    Reaction to eye drops   Codeine Rash   Feldene [Piroxicam] Rash  Penicillins Rash    Has patient had a PCN reaction causing immediate rash, facial/tongue/throat swelling, SOB or lightheadedness with hypotension:Yes Has patient had a PCN reaction causing severe rash involving mucus membranes or skin necrosis: No Has patient had a PCN reaction that required hospitalization:No Has patient had a PCN reaction occurring within the last 10 years:No If all of the above answers are "NO", then may proceed with Cephalosporin use.    Piroxicam Rash   Sulfa Antibiotics Rash   Sulfonamide Derivatives Rash   Prior to Admission medications   Medication Sig Start Date End Date Taking? Authorizing Provider  Biotin 1000 MCG tablet Take 1,000 mcg by mouth daily.   Yes [provider]  CALCIUM CARBONATE PO Take 600 mg by mouth daily.    Yes [provider]  cetirizine (ZYRTEC) 10 MG tablet Take 10 mg by mouth every evening.    Yes [provider]  Cholecalciferol (VITAMIN D) 50 MCG (2000 UT) tablet Take 2,000 Units by mouth daily.   Yes [provider]  citalopram (CELEXA) 40 MG tablet Take 1 tablet (40 mg total) by mouth daily. 05/20/22  Yes Shade Flood, MD  hydroxypropyl methylcellulose / hypromellose (ISOPTO TEARS / GONIOVISC) 2.5 % ophthalmic solution Place 1 drop into both eyes 2 (two) times daily as needed for dry eyes.   Yes [provider]  levETIRAcetam (KEPPRA) 500 MG tablet Take 1 tablet (500 mg total) by mouth 2  (two) times daily. 09/07/21  Yes Van Clines, MD  levothyroxine (SYNTHROID) 50 MCG tablet TAKE 1 TABLET BY MOUTH EVERY DAY 02/20/22  Yes Shade Flood, MD  losartan (COZAAR) 25 MG tablet TAKE 2 TABLETS BY MOUTH EVERY DAY 06/10/22  Yes Shade Flood, MD  Multiple Vitamins-Minerals (MULTIPLE VITAMINS/WOMENS PO) Take 1 tablet by mouth daily.    Yes [provider]  pravastatin (PRAVACHOL) 80 MG tablet Take 1 tablet (80 mg total) by mouth daily. 12/10/21  Yes Shade Flood, MD   Social History   Socioeconomic History   Marital status: Divorced    Spouse name: Not on file   Number of children: 1   Years of education: Not on file   Highest education level: Associate degree: occupational, Scientist, product/process development, or vocational program  Occupational History   Occupation: Retired    Associate Professor: [employer]  Tobacco Use   Smoking status: Never   Smokeless tobacco: Never  Vaping Use   Vaping Use: Never used  Substance and Sexual Activity   Alcohol use: No    Alcohol/week: 0.0 standard drinks of alcohol   Drug use: No   Sexual activity: Never    Birth control/protection: Post-menopausal  Other Topics Concern   Not on file  Social History Narrative   Lives with blind dog and cat.    Right handed    Social Determinants of Health   Financial Resource Strain: Low Risk  (06/21/2022)   Overall Financial Resource Strain (CARDIA)    Difficulty of Paying Living Expenses: Not hard at all  Food Insecurity: No Food Insecurity (06/21/2022)   Hunger Vital Sign    Worried About Running Out of Food in the Last Year: Never true    Ran Out of Food in the Last Year: Never true  Transportation Needs: No Transportation Needs (06/21/2022)   PRAPARE - Administrator, Civil Service (Medical): No    Lack of Transportation (Non-Medical): No  Physical Activity: Inactive (06/21/2022)   Exercise Vital Sign  Days of Exercise per Week: 0 days    Minutes of Exercise per Session: 0 min  Stress:  No Stress Concern Present (06/21/2022)   [employer] of Occupational Health - Occupational Stress Questionnaire    Feeling of Stress : Only a little  Social Connections: Moderately Integrated (06/21/2022)   Social Connection and Isolation Panel [NHANES]    Frequency of Communication with Friends and Family: More than three times a week    Frequency of Social Gatherings with Friends and Family: Once a week    Attends Religious Services: More than 4 times per year    Active Member of [employer] or Organizations: Yes    Attends Engineer, structural: More than 4 times per year    Marital Status: Divorced  Intimate Partner Violence: Not At Risk (11/07/2021)   Humiliation, Afraid, Rape, and Kick questionnaire    Fear of Current or Ex-Partner: No    Emotionally Abused: No    Physically Abused: No    Sexually Abused: No    Review of Systems  Constitutional:  Negative for fatigue and unexpected weight change.  Respiratory:  Negative for chest tightness and shortness of breath.   Cardiovascular:  Negative for chest pain, palpitations and leg swelling.  Gastrointestinal:  Negative for abdominal pain and blood in stool.  Neurological:  Negative for dizziness, syncope, light-headedness and headaches.     Objective:   Vitals:   06/24/22 1310  BP: 128/72  Pulse: 65  Temp: 97.8 F (36.6 C)  TempSrc: Temporal  SpO2: 95%  Weight: 184 lb 6.4 oz (83.6 kg)  Height: 5\' 3"  (1.6 m)     Physical Exam Vitals reviewed.  Constitutional:      Appearance: Normal appearance. She is well-developed.  HENT:     Head: Normocephalic and atraumatic.  Eyes:     Conjunctiva/sclera: Conjunctivae normal.     Pupils: Pupils are equal, round, and reactive to light.  Neck:     Vascular: No carotid bruit.  Cardiovascular:     Rate and Rhythm: Normal rate and regular rhythm.     Heart sounds: Normal heart sounds.  Pulmonary:     Effort: Pulmonary effort is normal.     Breath sounds: Normal breath  sounds.  Abdominal:     Palpations: Abdomen is soft. There is no pulsatile mass.     Tenderness: There is no abdominal tenderness.  Musculoskeletal:     Right lower leg: No edema.     Left lower leg: No edema.  Skin:    General: Skin is warm and dry.     Comments: 4-62mm elevated/pedunculated lesion R thigh, see photo. No induration, nontender.  Neurological:     Mental Status: She is alert and oriented to person, place, and time.  Psychiatric:        Mood and Affect: Mood normal.        Behavior: Behavior normal.   Risks (including but not limited to bleeding and infection), benefits, and alternatives discussed for cryodestruction of R thigh lesion.  Verbal consent obtained after any questions were answered. Landmarks noted, and marked as needed. Liquid nitrogen -Freeze/thaw x3 with extension to 1-2 mm outside of lesion.   No complications.   RTC precautions discussed in regards to injection and aftercare discussed.     Right posterior medial thigh.   Assessment & Plan:  Jennifer [name] is a 72 y.o. female . Essential hypertension  Reactive depression - Plan: citalopram (CELEXA) 40 MG tablet  Acquired  hypothyroidism  Other hyperlipidemia - Plan: ezetimibe (ZETIA) 10 MG tablet  Prediabetes  Other depression   Meds ordered this encounter  Medications   citalopram (CELEXA) 40 MG tablet    Sig: Take 1 tablet (40 mg total) by mouth daily.    Dispense:  30 tablet    Refill:  0   ezetimibe (ZETIA) 10 MG tablet    Sig: Take 1 tablet (10 mg total) by mouth daily.    Dispense:  90 tablet    Refill:  3   Patient Instructions   [facility] There is a recommendation for RSV vaccine for patients over age 61.   Typically would recommend the RSV vaccine as most important for patients over age 41 that have comorbidities that put them at increased risk for severe disease (heart disease such as congestive heart failure, coronary artery disease, lung disease such as  asthma or COPD, kidney disease, liver disease, diabetes, chronic or progressive neurologic or muscular conditions, immunosuppressed, or being frail or of advanced age).  For others that do not have these risk factors, there still is some benefit from vaccination since age is one of the main risk factors for developing severe disease however baseline risk of developing severe disease and requiring hospitalization is likely to be lower compared to those that have comorbidities in addition to age.   CDC does have some information as well: ToyProtection.fi  If the lesion on the thigh does not resolve with this treatment today, I do recommend following up with your dermatologist as it may need to be excised.  If any increasing redness, discharge, signs of infection or worsening symptoms be seen but I do not expect that to occur.  Try Zetia once per day in addition to your pravastatin.  That can lower the LDL and should be tolerated well.  Should recheck cholesterol levels in the next few months, that can be done at [facility] or let me know if I need to place an order.  No other med changes at this time.  Let me know if there are questions and take care!  Cryoablation, Care After The following information offers guidance on how to care for yourself after your procedure. Your health care provider may also give you more specific instructions. If you have problems or questions, contact your health care provider. What can I expect after the procedure? After the procedure, it is common to have: Redness or blisters near the area treated. Mild pain and swelling. Follow these instructions at home: Treatment area care  If you have an incision, follow instructions from your health care provider about how to take care of it. Make sure you: Wash your hands with soap and water for at least 20 seconds before and after you change your bandage (dressing).  If soap and water are not available, use hand sanitizer. Change your dressing as told by your health care provider. Leave stitches (sutures), skin glue, or adhesive strips in place. These skin closures may need to stay in place for 2 weeks or longer. If adhesive strip edges start to loosen and curl up, you may trim the loose edges. Do not remove adhesive strips completely unless your health care provider tells you to do that. Check your treatment area every day for signs of infection. Check for: More redness, swelling, or pain. Fluid or blood. Warmth. Pus or a bad smell. Keep the treated area clean and dry. Keep it covered with a dressing until it  has healed. Clean the area with soap and water as told by your health care provider. If your dressing gets wet, change it right away. Activity  Follow instructions from your health care provider about what activities are safe for you. You may have to avoid lifting. Ask your health care provider how much you can safely lift. If you were given a sedative during the procedure, it can affect you for several hours. Do not drive or operate machinery until your health care provider says that it is safe. General instructions Take over-the-counter and prescription medicines only as told by your health care provider. Do not use any products that contain nicotine or tobacco. These products include cigarettes, chewing tobacco, and vaping devices, such as e-cigarettes. These can delay incision healing. If you need help quitting, ask your health care provider. Do not take baths, swim, or use a hot tub until your health care provider approves. Ask your health care provider if you may take showers. You may only be allowed to take sponge baths. Keep all follow-up visits. Your health care provider may need to check that treatment worked and that there were no problems caused by the procedure. Contact a health care provider if: You have more pain. You have a fever. You  have nausea or vomiting. You have any signs of infection. You do not have a bowel movement for 2 days. You cannot urinate, or you cannot control when you urinate or have a bowel movement (have incontinence). You develop impotence. Get help right away if: You have severe pain. You have trouble swallowing or breathing. You are very weak or dizzy. You have chest pain or shortness of breath. These symptoms may be an emergency. Get help right away. Call 911. Do not wait to see if the symptoms will go away. Do not drive yourself to the hospital. This information is not intended to replace advice given to you by your health care provider. Make sure you discuss any questions you have with your health care provider. Document Revised: 06/08/2021 Document Reviewed: 06/08/2021 Elsevier Patient Education  2024 Elsevier Inc.     Signed,   Meredith Staggers, MD [facility], [facility] 06/24/22 1:49 PM

## 2022-06-25 ENCOUNTER — Encounter: Payer: Self-pay | Admitting: Family Medicine

## 2022-07-04 ENCOUNTER — Encounter: Payer: Self-pay | Admitting: Internal Medicine

## 2022-07-17 ENCOUNTER — Ambulatory Visit (INDEPENDENT_AMBULATORY_CARE_PROVIDER_SITE_OTHER): Payer: Medicare Other | Admitting: Family Medicine

## 2022-07-17 ENCOUNTER — Encounter (INDEPENDENT_AMBULATORY_CARE_PROVIDER_SITE_OTHER): Payer: Self-pay | Admitting: Family Medicine

## 2022-07-17 VITALS — BP 115/65 | HR 71 | Temp 98.2°F | Ht 63.0 in | Wt 178.0 lb

## 2022-07-17 DIAGNOSIS — E7849 Other hyperlipidemia: Secondary | ICD-10-CM | POA: Diagnosis not present

## 2022-07-17 DIAGNOSIS — Z6831 Body mass index (BMI) 31.0-31.9, adult: Secondary | ICD-10-CM | POA: Diagnosis not present

## 2022-07-17 DIAGNOSIS — E669 Obesity, unspecified: Secondary | ICD-10-CM

## 2022-07-17 DIAGNOSIS — F3289 Other specified depressive episodes: Secondary | ICD-10-CM | POA: Diagnosis not present

## 2022-07-17 NOTE — Progress Notes (Unsigned)
Chief Complaint:   OBESITY Jennifer [name] is here to discuss her progress with her obesity treatment plan along with follow-up of her obesity related diagnoses. Jennifer [name] is on the Category 1 Plan and states she is following her eating plan approximately 50% of the time. Jennifer [name] states she is doing 0 minutes 0 times per week.  Today's visit was #: 51 Starting weight: 208 lbs Starting date: 08/25/2018 Today's weight: 178 lbs Today's date: 07/17/2022 Total lbs lost to date: 30 Total lbs lost since last in-office visit: 0  Interim History: Patient is doing well with eating all of the food on her plan, but she often eats too much of her healthy options and her weight is creeping up.  Subjective:   1. Other hyperlipidemia Patient is on Pravachol and has recently started Zetia.  Her recent LDL and triglycerides were above goal.  I discussed labs with the patient today.  2. Emotional Eating Behavior Patient notes increased snacking when she is having more downtime.  Assessment/Plan:   1. Other hyperlipidemia Patient will continue her medications as is, and we will follow-up at her next visit.  2. Emotional Eating Behavior Patient will work on finding activities to help keep herself occupied and entertained instead of snacking.  Strategies were discussed in depth today.  3. BMI 31.0-31.9,adult  4. Obesity, Beginning BMI 36.8 Jennifer [name] is currently in the action stage of change. As such, her goal is to continue with weight loss efforts. She has agreed to the Category 1 Plan.   Behavioral modification strategies: increasing lean protein intake and travel eating strategies.  Jennifer [name] has agreed to follow-up with our clinic in 4 weeks. She was informed of the importance of frequent follow-up visits to maximize her success with intensive lifestyle modifications for her multiple health conditions.   Objective:   Blood pressure 115/65, pulse 71, temperature 98.2 F (36.8 C), height 5\' 3"  (1.6  m), weight 178 lb (80.7 kg), SpO2 91 %. Body mass index is 31.53 kg/m.  Lab Results  Component Value Date   CREATININE 0.76 06/12/2022   BUN 17 06/12/2022   NA 138 06/12/2022   K 5.1 06/12/2022   CL 99 06/12/2022   CO2 26 06/12/2022   Lab Results  Component Value Date   ALT 18 06/12/2022   AST 19 06/12/2022   ALKPHOS 99 06/12/2022   BILITOT 0.6 06/12/2022   Lab Results  Component Value Date   HGBA1C 5.5 06/12/2022   HGBA1C 5.3 12/17/2021   HGBA1C 5.2 06/06/2021   HGBA1C 5.0 12/25/2020   HGBA1C 5.2 01/17/2020   Lab Results  Component Value Date   INSULIN 8.0 06/12/2022   INSULIN 6.8 12/17/2021   INSULIN 9.4 06/06/2021   INSULIN 6.8 12/25/2020   INSULIN 4.7 01/17/2020   Lab Results  Component Value Date   TSH 2.570 06/12/2022   Lab Results  Component Value Date   CHOL 237 (H) 06/12/2022   HDL 56 06/12/2022   LDLCALC 144 (H) 06/12/2022   LDLDIRECT 96.0 05/04/2018   TRIG 208 (H) 06/12/2022   CHOLHDL 3.8 12/25/2020   Lab Results  Component Value Date   VD25OH 40.8 06/12/2022   VD25OH 45.9 12/17/2021   VD25OH 56.0 06/06/2021   Lab Results  Component Value Date   WBC 4.7 06/12/2022   HGB 13.6 06/12/2022   HCT 40.5 06/12/2022   MCV 90 06/12/2022   PLT 314 06/12/2022   Lab Results  Component Value Date   IRON 110 11/01/2019  TIBC 382 11/01/2019   Attestation Statements:   Reviewed by clinician on day of visit: allergies, medications, problem list, medical history, surgical history, family history, social history, and previous encounter notes.  Time spent on visit including pre-visit chart review and post-visit care and charting was 40 minutes.   I, Burt Knack, am acting as transcriptionist for Quillian Quince, MD.  I have reviewed the above documentation for accuracy and completeness, and I agree with the above. -  Quillian Quince, MD

## 2022-07-22 ENCOUNTER — Telehealth (INDEPENDENT_AMBULATORY_CARE_PROVIDER_SITE_OTHER): Payer: Medicare Other | Admitting: Psychology

## 2022-07-22 ENCOUNTER — Other Ambulatory Visit: Payer: Self-pay | Admitting: Family Medicine

## 2022-07-22 DIAGNOSIS — F329 Major depressive disorder, single episode, unspecified: Secondary | ICD-10-CM

## 2022-08-05 ENCOUNTER — Telehealth (INDEPENDENT_AMBULATORY_CARE_PROVIDER_SITE_OTHER): Payer: Medicare Other | Admitting: Psychology

## 2022-08-21 ENCOUNTER — Ambulatory Visit (INDEPENDENT_AMBULATORY_CARE_PROVIDER_SITE_OTHER): Payer: Medicare Other | Admitting: Family Medicine

## 2022-08-21 ENCOUNTER — Encounter (INDEPENDENT_AMBULATORY_CARE_PROVIDER_SITE_OTHER): Payer: Self-pay | Admitting: Family Medicine

## 2022-08-21 VITALS — BP 101/57 | HR 69 | Temp 98.1°F | Ht 63.0 in | Wt 179.0 lb

## 2022-08-21 DIAGNOSIS — Z6831 Body mass index (BMI) 31.0-31.9, adult: Secondary | ICD-10-CM

## 2022-08-21 DIAGNOSIS — E88819 Insulin resistance, unspecified: Secondary | ICD-10-CM | POA: Diagnosis not present

## 2022-08-21 DIAGNOSIS — E669 Obesity, unspecified: Secondary | ICD-10-CM | POA: Diagnosis not present

## 2022-08-21 DIAGNOSIS — E559 Vitamin D deficiency, unspecified: Secondary | ICD-10-CM | POA: Diagnosis not present

## 2022-08-26 NOTE — Progress Notes (Unsigned)
Chief Complaint:   OBESITY Jennifer [name] is here to discuss her progress with her obesity treatment plan along with follow-up of her obesity related diagnoses. [name] is on the Category 1 Plan and states she is following her eating plan approximately 20% of the time. [name] states she is doing 0 minutes 0 times per week.  Today's visit was #: 52 Starting weight: 208 lbs Starting date: 08/25/2018 Today's weight: 179 lbs Today's date: 08/21/2022 Total lbs lost to date: 29 Total lbs lost since last in-office visit: 0  Interim History: Patient has been traveling a lot since last month.  She was completely out of her normal routine but she did well with minimizing weight gain.  She tried to increase protein and not skip breakfast.  She worked on portion control and Building surveyor.  Subjective:   1. Insulin resistance Patient continues to work on decreasing simple carbohydrates.  She is not on metformin and she has been mindful of her food choices.  2. Vitamin D deficiency Patient is on vitamin D OTC and she is maintaining her vitamin D level well.  Assessment/Plan:   1. Insulin resistance We discussed various strategies with diet and exercise to help control insulin resistance and prevent diabetes mellitus.  We will continue to follow.  2. Vitamin D deficiency Patient will continue vitamin D OTC and we will continue to follow.  3. BMI 31.0-31.9,adult  4. Obesity, Beginning BMI 36.8 Jennifer [name] is currently in the action stage of change. As such, her goal is to continue with weight loss efforts. She has agreed to the Category 1 Plan.   Behavioral modification strategies: increasing lean protein intake.  [name] has agreed to follow-up with our clinic in 4 weeks. She was informed of the importance of frequent follow-up visits to maximize her success with intensive lifestyle modifications for her multiple health conditions.   Objective:   Blood pressure (!) 101/57, pulse  69, temperature 98.1 F (36.7 C), height 5\' 3"  (1.6 m), weight 179 lb (81.2 kg), SpO2 91%. Body mass index is 31.71 kg/m.  Lab Results  Component Value Date   CREATININE 0.76 06/12/2022   BUN 17 06/12/2022   NA 138 06/12/2022   K 5.1 06/12/2022   CL 99 06/12/2022   CO2 26 06/12/2022   Lab Results  Component Value Date   ALT 18 06/12/2022   AST 19 06/12/2022   ALKPHOS 99 06/12/2022   BILITOT 0.6 06/12/2022   Lab Results  Component Value Date   HGBA1C 5.5 06/12/2022   HGBA1C 5.3 12/17/2021   HGBA1C 5.2 06/06/2021   HGBA1C 5.0 12/25/2020   HGBA1C 5.2 01/17/2020   Lab Results  Component Value Date   INSULIN 8.0 06/12/2022   INSULIN 6.8 12/17/2021   INSULIN 9.4 06/06/2021   INSULIN 6.8 12/25/2020   INSULIN 4.7 01/17/2020   Lab Results  Component Value Date   TSH 2.570 06/12/2022   Lab Results  Component Value Date   CHOL 237 (H) 06/12/2022   HDL 56 06/12/2022   LDLCALC 144 (H) 06/12/2022   LDLDIRECT 96.0 05/04/2018   TRIG 208 (H) 06/12/2022   CHOLHDL 3.8 12/25/2020   Lab Results  Component Value Date   VD25OH 40.8 06/12/2022   VD25OH 45.9 12/17/2021   VD25OH 56.0 06/06/2021   Lab Results  Component Value Date   WBC 4.7 06/12/2022   HGB 13.6 06/12/2022   HCT 40.5 06/12/2022   MCV 90 06/12/2022   PLT 314 06/12/2022  Lab Results  Component Value Date   IRON 110 11/01/2019   TIBC 382 11/01/2019   Attestation Statements:   Reviewed by clinician on day of visit: allergies, medications, problem list, medical history, surgical history, family history, social history, and previous encounter notes.  Time spent on visit including pre-visit chart review and post-visit care and charting was 40 minutes.   I, Burt Knack, am acting as transcriptionist for Quillian Quince, MD.  I have reviewed the above documentation for accuracy and completeness, and I agree with the above. -  Quillian Quince, MD

## 2022-09-06 DIAGNOSIS — Z1231 Encounter for screening mammogram for malignant neoplasm of breast: Secondary | ICD-10-CM | POA: Diagnosis not present

## 2022-09-06 LAB — HM MAMMOGRAPHY

## 2022-09-08 ENCOUNTER — Other Ambulatory Visit: Payer: Self-pay | Admitting: Neurology

## 2022-09-08 DIAGNOSIS — G40009 Localization-related (focal) (partial) idiopathic epilepsy and epileptic syndromes with seizures of localized onset, not intractable, without status epilepticus: Secondary | ICD-10-CM

## 2022-09-10 ENCOUNTER — Ambulatory Visit: Payer: Medicare Other | Admitting: Neurology

## 2022-09-11 ENCOUNTER — Encounter: Payer: Self-pay | Admitting: Critical Care Medicine

## 2022-09-12 ENCOUNTER — Other Ambulatory Visit: Payer: Self-pay | Admitting: Family Medicine

## 2022-09-12 DIAGNOSIS — E119 Type 2 diabetes mellitus without complications: Secondary | ICD-10-CM

## 2022-09-12 DIAGNOSIS — I10 Essential (primary) hypertension: Secondary | ICD-10-CM

## 2022-09-23 ENCOUNTER — Ambulatory Visit (INDEPENDENT_AMBULATORY_CARE_PROVIDER_SITE_OTHER): Payer: Medicare Other | Admitting: Family Medicine

## 2022-09-23 ENCOUNTER — Encounter: Payer: Self-pay | Admitting: Family Medicine

## 2022-09-23 VITALS — BP 116/70 | HR 56 | Temp 97.8°F | Wt 185.4 lb

## 2022-09-23 DIAGNOSIS — R630 Anorexia: Secondary | ICD-10-CM | POA: Diagnosis not present

## 2022-09-23 DIAGNOSIS — R1013 Epigastric pain: Secondary | ICD-10-CM | POA: Diagnosis not present

## 2022-09-23 DIAGNOSIS — R5383 Other fatigue: Secondary | ICD-10-CM

## 2022-09-23 DIAGNOSIS — R1012 Left upper quadrant pain: Secondary | ICD-10-CM | POA: Diagnosis not present

## 2022-09-23 DIAGNOSIS — R12 Heartburn: Secondary | ICD-10-CM

## 2022-09-23 MED ORDER — OMEPRAZOLE 20 MG PO CPDR
20.0000 mg | DELAYED_RELEASE_CAPSULE | Freq: Every day | ORAL | 3 refills | Status: DC
Start: 2022-09-23 — End: 2022-09-30

## 2022-09-23 NOTE — Patient Instructions (Signed)
Start omeprazole once per day in case some of this could be from stomach irritation.  Stop ibuprofen for now.  Tylenol is okay temporarily if needed.  I will check anemia testing, electrolytes, and a pancreas test and if any concerns on those labs will discuss next step.  If any fevers, nausea or vomiting, or acute worsening abdominal pain, be seen (but I do not expect that to happen).  Give me an update in the next few days of how you are feeling, but again I will review your labs prior to that time.  Hang in there.  Abdominal Pain, Adult  Pain in the abdomen (abdominal pain) can be caused by many things. In most cases, it gets better with no treatment or by being treated at home. But in some cases, it can be serious. Your health care provider will ask questions about your medical history and do a physical exam to try to figure out what is causing your pain. Follow these instructions at home: Medicines Take over-the-counter and prescription medicines only as told by your provider. Do not take medicines that help you poop (laxatives) unless told by your provider. General instructions Watch your condition for any changes. Drink enough fluid to keep your pee (urine) pale yellow. Contact a health care provider if: Your pain changes, gets worse, or lasts longer than expected. You have severe cramping or bloating in your abdomen, or you vomit. Your pain gets worse with meals, after eating, or with certain foods. You are constipated or have diarrhea for more than 2-3 days. You are not hungry, or you lose weight without trying. You have signs of dehydration. These may include: Dark pee, very little pee, or no pee. Cracked lips or dry mouth. Sleepiness or weakness. You have pain when you pee (urinate) or poop. Your abdominal pain wakes you up at night. You have blood in your pee. You have a fever. Get help right away if: You cannot stop vomiting. Your pain is only in one part of the abdomen.  Pain on the right side could be caused by appendicitis. You have bloody or black poop (stool), or poop that looks like tar. You have trouble breathing. You have chest pain. These symptoms may be an emergency. Get help right away. Call 911. Do not wait to see if the symptoms will go away. Do not drive yourself to the hospital. This information is not intended to replace advice given to you by your health care provider. Make sure you discuss any questions you have with your health care provider. Document Revised: 10/10/2021 Document Reviewed: 10/10/2021 Elsevier Patient Education  2024 [employer].

## 2022-09-23 NOTE — Progress Notes (Signed)
Subjective:  Patient ID: Jennifer [name], female    DOB: 1950/06/06  Age: 72 y.o. MRN: [id number]  CC:  Chief Complaint  Patient presents with   Fatigue    Covid test neg  this morning, had some pain in left upper abdomen over the weekend. Just thought it would pass, but did not. Has had a loss of appetite and states it is unusual.     HPI YENTY COCKRAN presents for   Abdominal pain, fatigue Fatigue for past month. Daytime naps, not typical for her. Has been using cpap consistently. Not rested in am. No chest pain/palpitations.  dyspnea, or DOE.  Left upper abdominal pain over the weekend - upper abdomen to LUQ starting 2 nights ago. No n/v, but some decreased appetite.  Negative COVID test this morning. No new cough. No new sick contacts known.  Heartburn a few weeks ago. Intermittent since. Resolved with tums.  Occasional jaw pain after biting into something hard a month ago. Ibuprofen 800mg  most days since.  No history of PUD, or GI bleed. No melena/hematochezia. No weight loss, fever. Some warm feeling at 10:30, no night sweats.  No urgency/urinary/hematuria/dysuria.  Regular BM's, some hard stools.  Frequent diet pepsi.  No tobacco.  Able to eat piece of toast today- sore after eating today. Drinking fluids without difficulty.  Woke up with pain yesterday am.    History Patient Active Problem List   Diagnosis Date Noted   Other hyperlipidemia 06/12/2022   Stress 04/17/2022   BMI 31.0-31.9,adult 02/20/2022   Obesity, Beginning BMI 36.8 02/20/2022   Hypothyroidism 12/17/2021   Mouth abscess 12/17/2021   Hyperglycemia 12/17/2021   Pain of right hip 10/01/2021   Mixed hyperlipidemia 06/06/2021   Insulin resistance 06/06/2021   Vitamin D deficiency 05/07/2021   Elevated LFTs 11/01/2019   Abrasion of left buttock 08/05/2018   S/P total knee arthroplasty, left 07/20/2018   Degenerative arthritis of left knee 07/17/2018   Chronic constipation 12/20/2017    Class 2 severe obesity with serious comorbidity and body mass index (BMI) of 36.0 to 36.9 in adult [facility]) 12/20/2017   Primary osteoarthritis of right hip 10/20/2015   Arthritis of right hip 10/20/2015   Localization-related idiopathic epilepsy and epileptic syndromes with seizures of localized onset, not intractable, without status epilepticus (HCC) 09/09/2014   Depression 07/19/2014   Hx of adenomatous polyp of colon 03/29/2014   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 PAC'S COMES AND GOES   Gallbladder problem    GERD (gastroesophageal reflux disease)    High cholesterol    Hx of adenomatous polyp of colon    2012 - [facility]   Hyperlipidemia    Hypertension    Hypothyroid    Joint pain    no current problems   Osteoarthritis    PLMD (periodic limb movement disorder) 04/24/2012   Prediabetes    Seizures (HCC) 08/29/2013   only 1 seizure episode   Sleep apnea    wears CPAP nightly   Vitamin D deficiency    Past Surgical History:  Procedure Laterality Date   APPENDECTOMY     CESAREAN SECTION      x1   CHOLECYSTECTOMY  1978   COLONOSCOPY     DILATION AND CURETTAGE OF UTERUS     JOINT REPLACEMENT  1998   right knee   OSTEOTOMY PROXIMAL FEMORAL     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   Procedure: TOTAL HIP ARTHROPLASTY ANTERIOR APPROACH;  Surgeon: Gean Birchwood, MD;  Location: [facility] OR;  Service: Orthopedics;  Laterality: Right;   TOTAL KNEE ARTHROPLASTY Left 07/20/2018   Procedure: Left Knee Arthroplasty;  Surgeon: Gean Birchwood, MD;  Location: [facility] OR[facility];  Service: Orthopedics;  Laterality:  Left;   Allergies  Allergen Reactions   Crestor [Rosuvastatin] Other (See Comments)    Muscle aches   Neomycin-Bacitracin Zn-Polymyx Swelling    OPTHALMIC EXPOSURE ONLY - - SWELLING EYES    Neomycin-Bacitracin-Polymyxin [Bacitracin-Neomycin-Polymyxin] Swelling    Reaction to eye drops   Codeine Rash   Feldene [Piroxicam] Rash   Penicillins Rash    Has patient had a PCN reaction causing immediate rash, facial/tongue/throat swelling, SOB or lightheadedness with hypotension:Yes Has patient had a PCN reaction causing severe rash involving mucus membranes or skin necrosis: No Has patient had a PCN reaction that required hospitalization:No Has patient had a PCN reaction occurring within the last 10 years:No If all of the above answers are "NO", then may proceed with Cephalosporin use.    Piroxicam Rash   Sulfa Antibiotics Rash   Sulfonamide Derivatives Rash   Prior to Admission medications   Medication Sig Start Date End Date Taking? Authorizing Provider  Biotin 1000 MCG tablet Take 1,000 mcg by mouth daily.   Yes [provider]  CALCIUM CARBONATE PO Take 600 mg by mouth daily.    Yes [provider]  cetirizine (ZYRTEC) 10 MG tablet Take 10 mg by mouth every evening.    Yes [provider]  Cholecalciferol (VITAMIN D) 50 MCG (2000 UT) tablet Take 2,000 Units by mouth daily.   Yes [provider]  citalopram (CELEXA) 40 MG tablet TAKE 1 TABLET BY MOUTH EVERY DAY 07/22/22  Yes Shade Flood, MD  ezetimibe (ZETIA) 10 MG tablet Take 1 tablet (10 mg total) by mouth daily. 06/24/22  Yes Shade Flood, MD  hydroxypropyl methylcellulose / hypromellose (ISOPTO TEARS / GONIOVISC) 2.5 % ophthalmic solution Place 1 drop into both eyes 2 (two) times daily as needed for dry eyes.   Yes [provider]  levETIRAcetam (KEPPRA) 500 MG tablet Take 1 tablet by mouth twice daily 09/11/22  Yes Van Clines, MD  levothyroxine (SYNTHROID) 50 MCG tablet TAKE  1 TABLET BY MOUTH EVERY DAY 02/20/22  Yes Shade Flood, MD  losartan (COZAAR) 25 MG tablet TAKE 2 TABLETS BY MOUTH EVERY DAY 09/12/22  Yes Shade Flood, MD  Multiple Vitamins-Minerals (MULTIPLE VITAMINS/WOMENS PO) Take 1 tablet by mouth daily.    Yes [provider]  pravastatin (PRAVACHOL) 80 MG tablet Take 1 tablet (80 mg total) by mouth daily. 12/10/21  Yes Shade Flood, MD   Social History   Socioeconomic History   Marital status: Divorced    Spouse name: Not on file   Number of children: 1   Years of education: Not on file   Highest education level: Associate degree: occupational, Scientist, product/process development, or vocational program  Occupational History   Occupation: Retired    Employer: [employer]  Tobacco Use   Smoking status: Never   Smokeless tobacco: Never  Vaping Use   Vaping status: Never Used  Substance and Sexual Activity   Alcohol use: No    Alcohol/week: 0.0 standard drinks of alcohol   Drug use: No   Sexual activity: Never    Birth control/protection: Post-menopausal  Other Topics Concern   Not on file  Social History Narrative   Lives with blind dog and cat.    Right handed    Social Determinants of Health   Financial Resource Strain: Low Risk  (06/21/2022)   Overall Financial Resource Strain (CARDIA)    Difficulty of Paying Living Expenses: Not hard at all  Food Insecurity: No Food Insecurity (06/21/2022)   Hunger Vital Sign    Worried About Running Out of Food in the Last Year: Never true    Ran Out of Food in the Last Year: Never true  Transportation Needs: No Transportation Needs (06/21/2022)   PRAPARE - Administrator, Civil Service (Medical): No    Lack of Transportation (Non-Medical): No  Physical Activity: Inactive (06/21/2022)   Exercise Vital Sign    Days of Exercise per Week: 0 days    Minutes of Exercise per Session: 0 min  Stress: No Stress Concern Present (06/21/2022)   [employer] of Occupational Health - Occupational  Stress Questionnaire    Feeling of Stress : Only a little  Social Connections: Moderately Integrated (06/21/2022)   Social Connection and Isolation Panel [NHANES]    Frequency of Communication with Friends and Family: More than three times a week    Frequency of Social Gatherings with Friends and Family: Once a week    Attends Religious Services: More than 4 times per year    Active Member of [employer] or Organizations: Yes    Attends Engineer, structural: More than 4 times per year    Marital Status: Divorced  Intimate Partner Violence: Not At Risk (11/07/2021)   Humiliation, Afraid, Rape, and Kick questionnaire    Fear of Current or Ex-Partner: No    Emotionally Abused: No    Physically Abused: No    Sexually Abused: No    Review of Systems   Objective:   Vitals:   09/23/22 1509  BP: 116/70  Pulse: (!) 56  Temp: 97.8 F (36.6 C)  TempSrc: Temporal  SpO2: 99%  Weight: 185 lb 6.4 oz (84.1 kg)     Physical Exam Vitals reviewed.  Constitutional:      Appearance: Normal appearance. She is well-developed.  HENT:     Head: Normocephalic and atraumatic.  Eyes:     Conjunctiva/sclera: Conjunctivae normal.     Pupils: Pupils are equal, round, and reactive to light.  Neck:     Vascular: No carotid bruit.  Cardiovascular:     Rate and Rhythm: Normal rate and regular rhythm.     Heart sounds: Normal heart sounds.  Pulmonary:     Effort: Pulmonary effort is normal.     Breath sounds: Normal breath sounds.  Abdominal:     Palpations: Abdomen is soft. There is no mass or pulsatile mass.     Tenderness: There is abdominal tenderness (LUQ>epigastric.). There is no right CVA tenderness, left CVA tenderness, guarding or rebound.  Musculoskeletal:     Right lower leg: No edema.     Left lower leg: No edema.  Skin:    General: Skin is warm and dry.  Neurological:     Mental Status: She is alert and oriented to person, place, and time.  Psychiatric:  Mood and  Affect: Mood normal.        Behavior: Behavior normal.        Assessment & Plan:  RYELLE FINLAN is a 72 y.o. female . LUQ abdominal pain - Plan: CBC, Comprehensive metabolic panel, Lipase  Fatigue, unspecified type - Plan: CBC, CANCELED: POC Influenza A/B, CANCELED: POC COVID-19  Loss of appetite - Plan: CBC, Comprehensive metabolic panel, Lipase, CANCELED: POC Influenza A/B, CANCELED: POC COVID-19  Epigastric abdominal pain - Plan: CBC, Comprehensive metabolic panel, Lipase, omeprazole (PRILOSEC) 20 MG capsule  Heartburn - Plan: omeprazole (PRILOSEC) 20 MG capsule  1 month of fatigue with recent onset of epigastric and left upper quadrant abdominal pain.  Overall reassuring exam with some discomfort at the epigastrium and left upper quadrant without rebound/guarding.  Vital signs reassuring.  Differential includes gastritis or NSAID induced gastritis, so recommended stopping ibuprofen at this time.  Intermittent heartburn with possible gastritis will start omeprazole once per day.  Given location will check lipase for pancreas but no nausea or vomiting or fever.  Check CBC, CMP with fatigue.  Bland diet for now, ER/RTC precautions, will decide on imaging based on labs.   Meds ordered this encounter  Medications   omeprazole (PRILOSEC) 20 MG capsule    Sig: Take 1 capsule (20 mg total) by mouth daily.    Dispense:  30 capsule    Refill:  3   Patient Instructions  Start omeprazole once per day in case some of this could be from stomach irritation.  Stop ibuprofen for now.  Tylenol is okay temporarily if needed.  I will check anemia testing, electrolytes, and a pancreas test and if any concerns on those labs will discuss next step.  If any fevers, nausea or vomiting, or acute worsening abdominal pain, be seen (but I do not expect that to happen).  Give me an update in the next few days of how you are feeling, but again I will review your labs prior to that time.  Hang in  there.  Abdominal Pain, Adult  Pain in the abdomen (abdominal pain) can be caused by many things. In most cases, it gets better with no treatment or by being treated at home. But in some cases, it can be serious. Your health care provider will ask questions about your medical history and do a physical exam to try to figure out what is causing your pain. Follow these instructions at home: Medicines Take over-the-counter and prescription medicines only as told by your provider. Do not take medicines that help you poop (laxatives) unless told by your provider. General instructions Watch your condition for any changes. Drink enough fluid to keep your pee (urine) pale yellow. Contact a health care provider if: Your pain changes, gets worse, or lasts longer than expected. You have severe cramping or bloating in your abdomen, or you vomit. Your pain gets worse with meals, after eating, or with certain foods. You are constipated or have diarrhea for more than 2-3 days. You are not hungry, or you lose weight without trying. You have signs of dehydration. These may include: Dark pee, very little pee, or no pee. Cracked lips or dry mouth. Sleepiness or weakness. You have pain when you pee (urinate) or poop. Your abdominal pain wakes you up at night. You have blood in your pee. You have a fever. Get help right away if: You cannot stop vomiting. Your pain is only in one part of the abdomen. Pain on the right side  could be caused by appendicitis. You have bloody or black poop (stool), or poop that looks like tar. You have trouble breathing. You have chest pain. These symptoms may be an emergency. Get help right away. Call 911. Do not wait to see if the symptoms will go away. Do not drive yourself to the hospital. This information is not intended to replace advice given to you by your health care provider. Make sure you discuss any questions you have with your health care provider. Document  Revised: 10/10/2021 Document Reviewed: 10/10/2021 Elsevier Patient Education  2024 [employer] Inc[employer]     Signed,   Meredith Staggers, MD [facility], [facility] 09/23/22 3:52 PM

## 2022-09-24 ENCOUNTER — Other Ambulatory Visit: Payer: Self-pay | Admitting: Family Medicine

## 2022-09-24 DIAGNOSIS — R12 Heartburn: Secondary | ICD-10-CM

## 2022-09-24 DIAGNOSIS — R1013 Epigastric pain: Secondary | ICD-10-CM

## 2022-09-24 LAB — LIPASE: Lipase: 20 U/L (ref 11.0–59.0)

## 2022-09-24 LAB — CBC
HCT: 40.4 % (ref 36.0–46.0)
Hemoglobin: 13.4 g/dL (ref 12.0–15.0)
MCHC: 33.2 g/dL (ref 30.0–36.0)
MCV: 92.1 fl (ref 78.0–100.0)
Platelets: 344 10*3/uL (ref 150.0–400.0)
RBC: 4.38 Mil/uL (ref 3.87–5.11)
RDW: 12.6 % (ref 11.5–15.5)
WBC: 5.9 10*3/uL (ref 4.0–10.5)

## 2022-09-24 LAB — COMPREHENSIVE METABOLIC PANEL
ALT: 21 U/L (ref 0–35)
AST: 24 U/L (ref 0–37)
Albumin: 4.4 g/dL (ref 3.5–5.2)
Alkaline Phosphatase: 78 U/L (ref 39–117)
BUN: 17 mg/dL (ref 6–23)
CO2: 29 meq/L (ref 19–32)
Calcium: 9.8 mg/dL (ref 8.4–10.5)
Chloride: 99 meq/L (ref 96–112)
Creatinine, Ser: 0.65 mg/dL (ref 0.40–1.20)
GFR: 88.43 mL/min (ref >60.00)
Glucose, Bld: 80 mg/dL (ref 70–99)
Potassium: 4.6 meq/L (ref 3.5–5.1)
Sodium: 133 meq/L — ABNORMAL LOW (ref 135–145)
Total Bilirubin: 0.7 mg/dL (ref 0.2–1.2)
Total Protein: 7.4 g/dL (ref 6.0–8.3)

## 2022-09-25 ENCOUNTER — Ambulatory Visit (INDEPENDENT_AMBULATORY_CARE_PROVIDER_SITE_OTHER): Payer: Medicare Other | Admitting: Family Medicine

## 2022-09-25 ENCOUNTER — Telehealth: Payer: Self-pay | Admitting: Family Medicine

## 2022-09-25 DIAGNOSIS — F329 Major depressive disorder, single episode, unspecified: Secondary | ICD-10-CM

## 2022-09-25 MED ORDER — CITALOPRAM HYDROBROMIDE 20 MG PO TABS
20.0000 mg | ORAL_TABLET | Freq: Every day | ORAL | 3 refills | Status: DC
Start: 2022-09-25 — End: 2022-10-23

## 2022-09-25 NOTE — Telephone Encounter (Signed)
Call patient, check status, and make sure she received my notes on her labs.  Additionally there was a caution regarding her citalopram and the new acid blocking medication we started at last visit.  That medicine can increase the dose of her citalopram.  Would recommend decreasing the dose from 40 mg to 20 mg/day for now and I have sent a new prescription to her pharmacy.  Let me know if there are questions.

## 2022-09-26 NOTE — Telephone Encounter (Signed)
Pt returned phone call, confirmed that she saw the MyChart message regarding labs. She scheduled a 1 week F/U on Monday 9/23 at 1:40pm as suggested.

## 2022-09-26 NOTE — Telephone Encounter (Signed)
LM to call back.

## 2022-09-26 NOTE — Telephone Encounter (Signed)
Lab note: Overall good news on the labs.  Sodium level is borderline low but not concerning at that level and we can recheck it in the next week or 2.  Liver test looked okay.  Pancreas test was normal and infection fighting cells or blood count was normal.  Let me know how things are feeling with the addition of that omeprazole and avoiding NSAIDs.  May be a good idea to follow-up sometime next week, please call to schedule appointment at a time that works for you.  Let me know if there are questions.   Dr. Neva Seat

## 2022-09-28 IMAGING — CT CT HEAD W/O CM
2 series · 15 of 30 positions shown, 17 images · non-contrast
Comparison: 03/01/2013

CLINICAL DATA: Headache, new or worsening, benign positional
vertigo, left face pain



[Series 2: head without · axial · non-contrast · 0.42mm/px · z∈[-205,-80]mm · 7 of 35 slices shown, 9 images]
[im 5/35  brain]
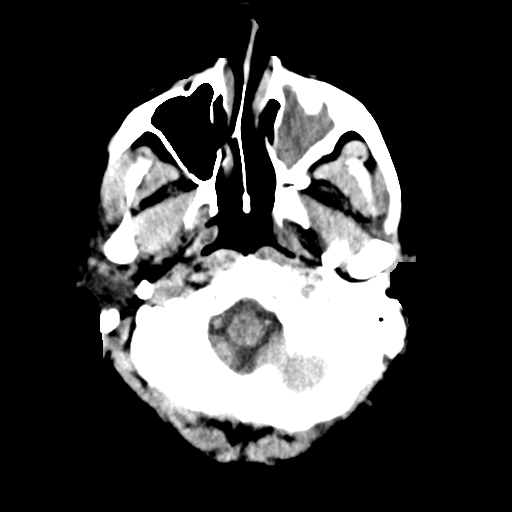
[im 5/35  bone]
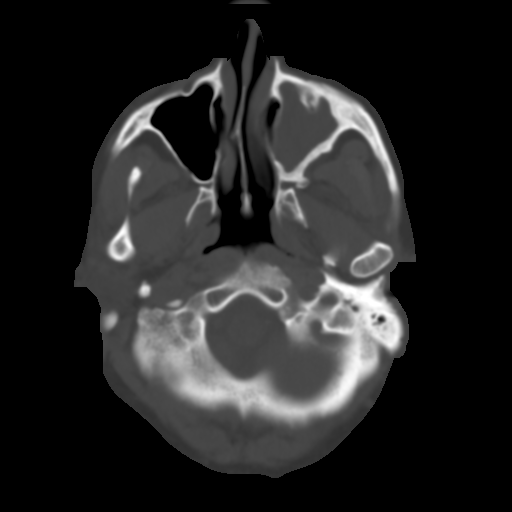
[im 9/35  brain]
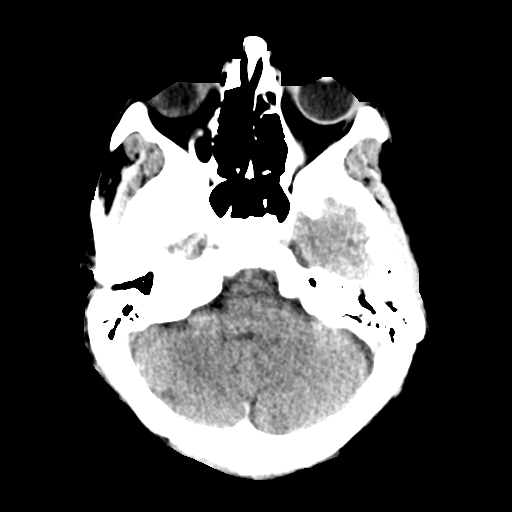
[im 13/35  brain]
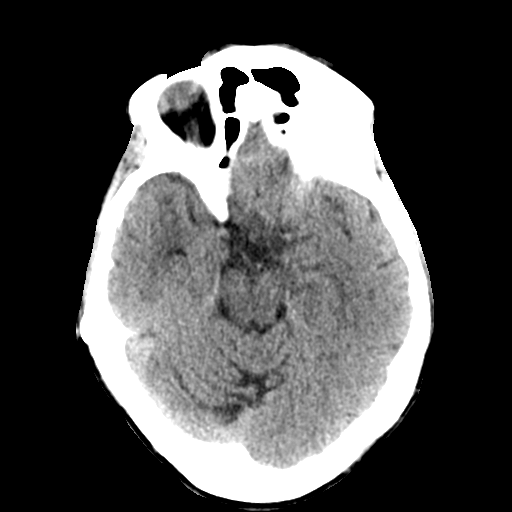
[im 18/35  brain]
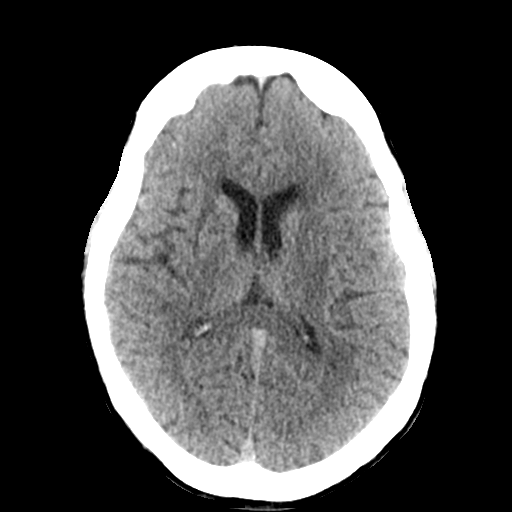
[im 22/35  brain]
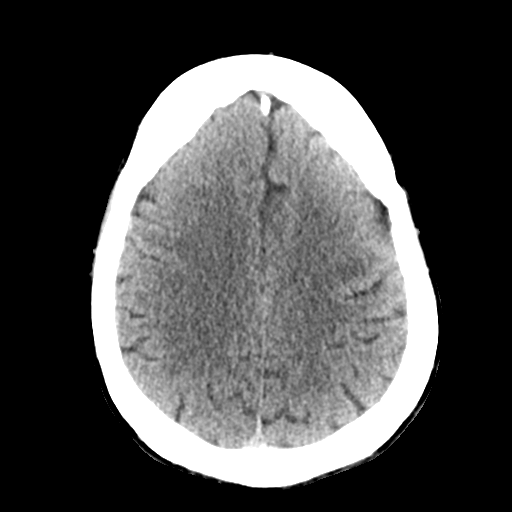
[im 22/35  bone]
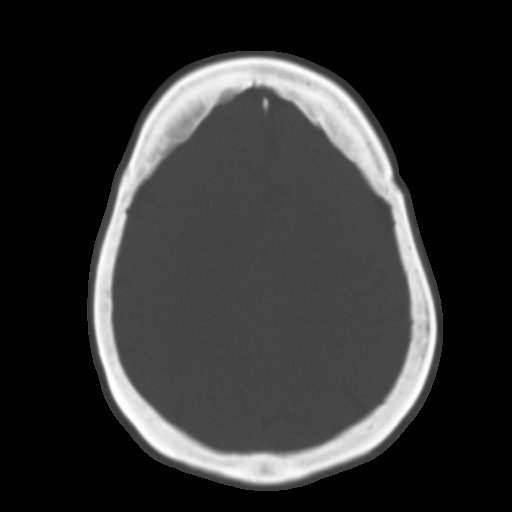
[im 26/35  brain]
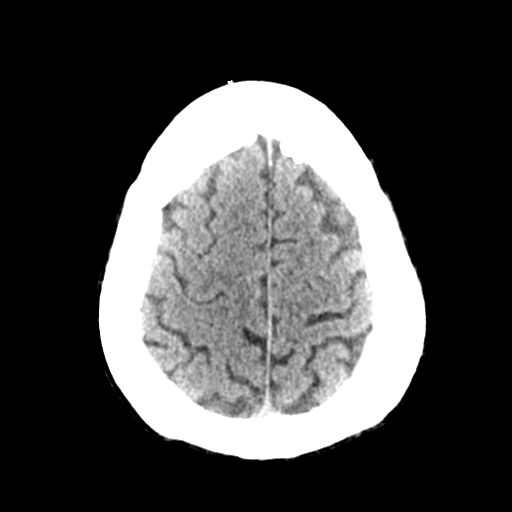
[im 30/35  brain]
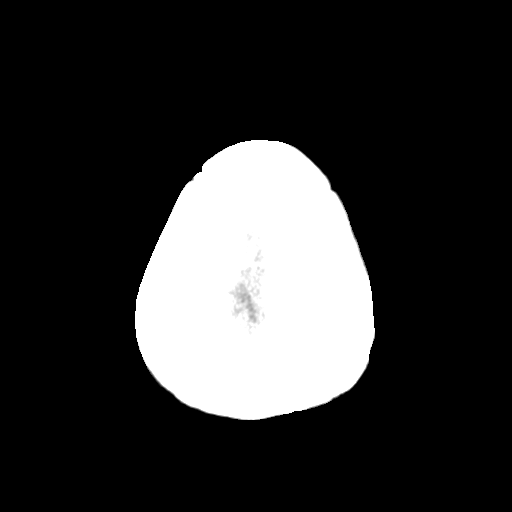

[Series 3: head bone · axial · 0.42mm/px · z∈[-209,-73]mm · 8 of 86 slices shown]
[im 9/86  bone]
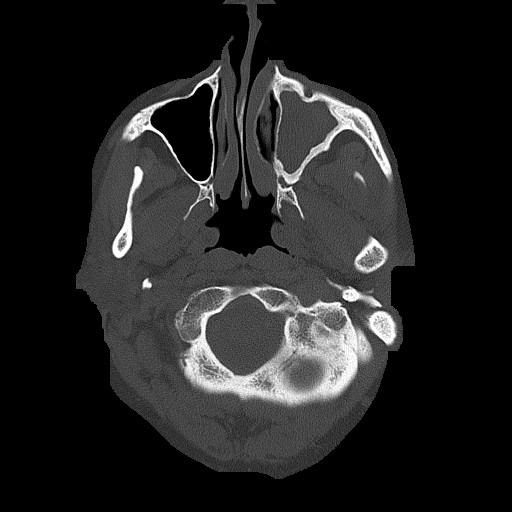
[im 18/86  bone]
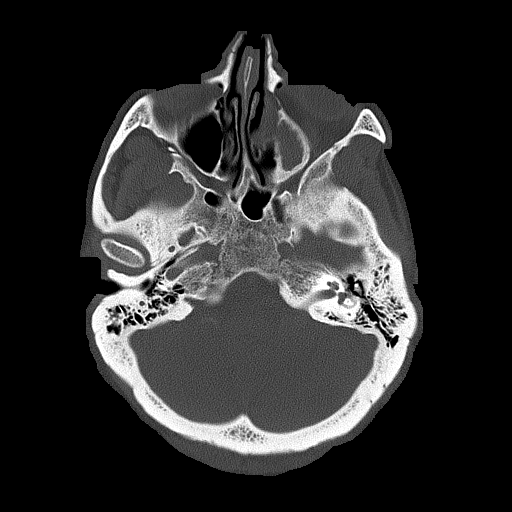
[im 26/86  bone]
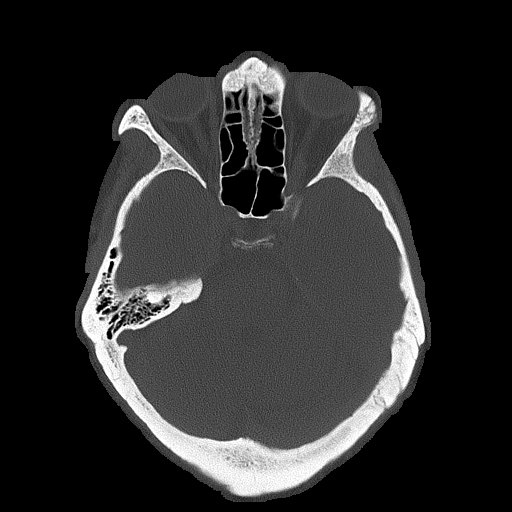
[im 39/86  bone]
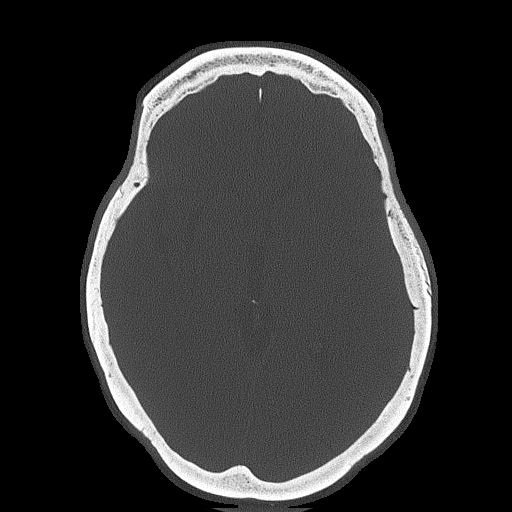
[im 47/86  bone]
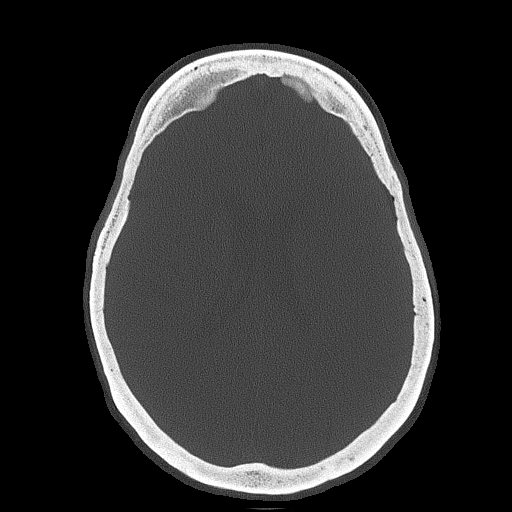
[im 60/86  bone]
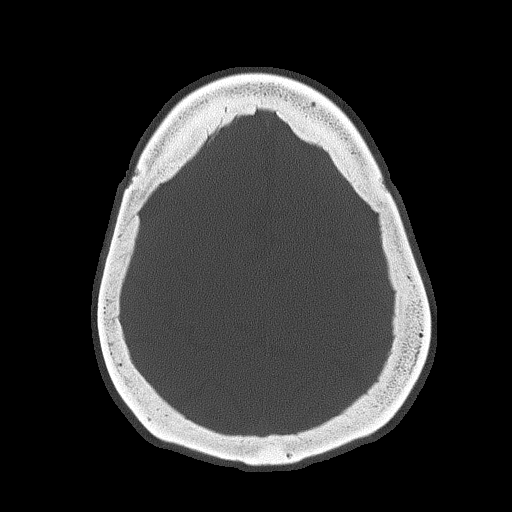
[im 69/86  bone]
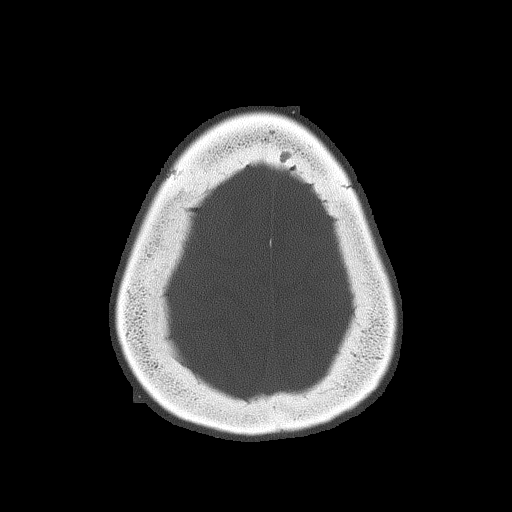
[im 77/86  bone]
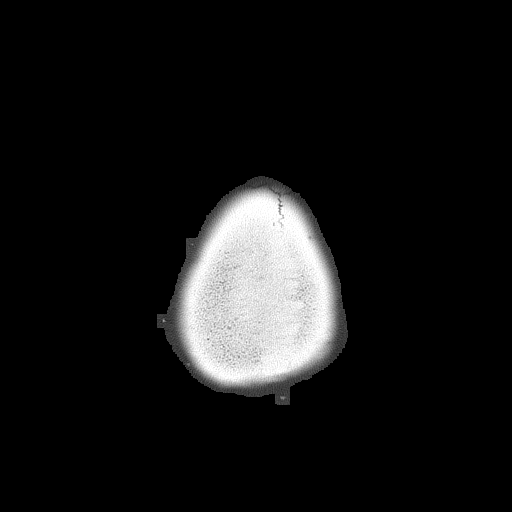

[15 of 30 positions shown; findings below may reference images not displayed]

FINDINGS: Brain: No evidence of acute infarction, hemorrhage, cerebral edema,
mass, mass effect, or midline shift. No hydrocephalus or extra-axial
fluid collection. Redemonstrated remote infarct in the right lateral
lentiform nucleus/external capsule. Ventricles and sulci are within
normal limits for age. Periventricular white matter changes, likely
the sequela of chronic small vessel ischemic disease.

Vascular: No hyperdense vessel.

Skull: Hyperostosis frontalis. Negative for fracture or focal
lesion.

Sinuses/Orbits: Complete opacification of the left maxillary sinus,
with significant osseous thickening, consistent with chronic
sinusitis. The sinuses are otherwise unremarkable.

Other: The mastoid air cells are well aerated.
IMPRESSION: IMPRESSION
1.  No acute intracranial process.

2.  Chronic left maxillary sinusitis.

## 2022-09-30 ENCOUNTER — Ambulatory Visit (INDEPENDENT_AMBULATORY_CARE_PROVIDER_SITE_OTHER): Payer: Medicare Other | Admitting: Family Medicine

## 2022-09-30 ENCOUNTER — Encounter: Payer: Self-pay | Admitting: Family Medicine

## 2022-09-30 VITALS — BP 130/78 | HR 65 | Temp 98.7°F | Wt 189.6 lb

## 2022-09-30 DIAGNOSIS — E871 Hypo-osmolality and hyponatremia: Secondary | ICD-10-CM

## 2022-09-30 DIAGNOSIS — R1012 Left upper quadrant pain: Secondary | ICD-10-CM

## 2022-09-30 DIAGNOSIS — R5383 Other fatigue: Secondary | ICD-10-CM

## 2022-09-30 DIAGNOSIS — R12 Heartburn: Secondary | ICD-10-CM

## 2022-09-30 DIAGNOSIS — R1013 Epigastric pain: Secondary | ICD-10-CM

## 2022-09-30 MED ORDER — OMEPRAZOLE 20 MG PO CPDR
20.0000 mg | DELAYED_RELEASE_CAPSULE | Freq: Every day | ORAL | 1 refills | Status: DC
Start: 2022-09-30 — End: 2023-05-19

## 2022-09-30 MED ORDER — SUCRALFATE 1 G PO TABS
1.0000 g | ORAL_TABLET | Freq: Three times a day (TID) | ORAL | 0 refills | Status: DC
Start: 2022-09-30 — End: 2023-05-19

## 2022-09-30 NOTE — Patient Instructions (Addendum)
Switch to lower dose of citalopram for now while taking omeprazole.  Try increasing omeprazole to twice per day.  Start carafate.  I will refer you to gastroenterology.  Continue to avoid NSAIDS.   If no concerns on CPAP, let me know and we can look at other cause fatigue. Return to the clinic or go to the nearest emergency room if any of your symptoms worsen or new symptoms occur.  Thanks for coming in today.  Let me know if there are questions.  Abdominal Pain, Adult  Pain in the abdomen (abdominal pain) can be caused by many things. In most cases, it gets better with no treatment or by being treated at home. But in some cases, it can be serious. Your health care provider will ask questions about your medical history and do a physical exam to try to figure out what is causing your pain. Follow these instructions at home: Medicines Take over-the-counter and prescription medicines only as told by your provider. Do not take medicines that help you poop (laxatives) unless told by your provider. General instructions Watch your condition for any changes. Drink enough fluid to keep your pee (urine) pale yellow. Contact a health care provider if: Your pain changes, gets worse, or lasts longer than expected. You have severe cramping or bloating in your abdomen, or you vomit. Your pain gets worse with meals, after eating, or with certain foods. You are constipated or have diarrhea for more than 2-3 days. You are not hungry, or you lose weight without trying. You have signs of dehydration. These may include: Dark pee, very little pee, or no pee. Cracked lips or dry mouth. Sleepiness or weakness. You have pain when you pee (urinate) or poop. Your abdominal pain wakes you up at night. You have blood in your pee. You have a fever. Get help right away if: You cannot stop vomiting. Your pain is only in one part of the abdomen. Pain on the right side could be caused by appendicitis. You have  bloody or black poop (stool), or poop that looks like tar. You have trouble breathing. You have chest pain. These symptoms may be an emergency. Get help right away. Call 911. Do not wait to see if the symptoms will go away. Do not drive yourself to the hospital. This information is not intended to replace advice given to you by your health care provider. Make sure you discuss any questions you have with your health care provider. Document Revised: 10/10/2021 Document Reviewed: 10/10/2021 Elsevier Patient Education  2024 [employer].

## 2022-09-30 NOTE — Progress Notes (Signed)
Subjective:  Patient ID: Jennifer [name], female    DOB: Feb 13, 1950  Age: 72 y.o. MRN: [id number]  CC:  Chief Complaint  Patient presents with   Follow-up    Upper left abd pain    HPI Jennifer [name] presents for   Recheck abdominal pain Follow-up from September 16, at that time some fatigue for the previous month, some upper abdominal pain the weekend preceding that appointment, radiated to left upper quadrant for the previous 2 days.  No nausea or vomiting at that time.  She had been using ibuprofen 800 mg most days over the previous month after some jaw pain after biting the something hard.  No melena/hematochezia, weight loss, fever or other red flag symptoms, no history of peptic ulcer disease or GI bleed.  Given her symptoms and NSAID use, possible NSAID induced gastritis versus PUD.  Started on omeprazole.  Advised to stop ibuprofen and other NSAIDs.  CBC, CMP and lipase were normal except borderline sodium at 133.  After appointment noted interaction with the omeprazole and citalopram, lower dose ordered at 20 mg daily.  Since last visit - only 1 episode of more severe pain in past week, but still some upper abdominal discomfort. Still upper left abdominal pain tha t has persisted, but milder. Pain has improved overall. No fever. No n/v/diarrhea.  Has avoided nsaids. Normal BM''s, no hematochezia/melena. Heartburn has resolved. No urinary symptoms.  Drinking fluids. Started to feel hungry a week after starting meds from last visit.   Still fatigued. Uses CPAP., but message says to adjust mask. No palpitations/chest pains.     History Patient Active Problem List   Diagnosis Date Noted   Other hyperlipidemia 06/12/2022   Stress 04/17/2022   BMI 31.0-31.9,adult 02/20/2022   Obesity, Beginning BMI 36.8 02/20/2022   Hypothyroidism 12/17/2021   Mouth abscess 12/17/2021   Hyperglycemia 12/17/2021   Pain of right hip 10/01/2021   Mixed hyperlipidemia 06/06/2021    Insulin resistance 06/06/2021   Vitamin D deficiency 05/07/2021   Elevated LFTs 11/01/2019   Abrasion of left buttock 08/05/2018   S/P total knee arthroplasty, left 07/20/2018   Degenerative arthritis of left knee 07/17/2018   Chronic constipation 12/20/2017   Class 2 severe obesity with serious comorbidity and body mass index (BMI) of 36.0 to 36.9 in adult [facility]) 12/20/2017   Primary osteoarthritis of right hip 10/20/2015   Arthritis of right hip 10/20/2015   Localization-related idiopathic epilepsy and epileptic syndromes with seizures of localized onset, not intractable, without status epilepticus (HCC) 09/09/2014   Depression 07/19/2014   Hx of adenomatous polyp of colon 03/29/2014   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 PAC'S COMES AND GOES   Gallbladder problem    GERD (gastroesophageal reflux disease)    High cholesterol    Hx of adenomatous polyp of colon    2012 - [place]   Hyperlipidemia    Hypertension    Hypothyroid    Joint pain    no current problems   Osteoarthritis    PLMD (periodic limb movement disorder)  04/24/2012   Prediabetes    Seizures (HCC) 08/29/2013   only 1 seizure episode   Sleep apnea    wears CPAP nightly   Vitamin D deficiency    Past Surgical History:  Procedure Laterality Date   APPENDECTOMY     CESAREAN SECTION      x1   CHOLECYSTECTOMY  1978   COLONOSCOPY     DILATION AND CURETTAGE OF UTERUS     JOINT REPLACEMENT  1998   right knee   OSTEOTOMY PROXIMAL FEMORAL     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   Procedure: TOTAL HIP ARTHROPLASTY  ANTERIOR APPROACH;  Surgeon: Gean Birchwood, MD;  Location: [facility] OR;  Service: Orthopedics;  Laterality: Right;   TOTAL KNEE ARTHROPLASTY Left 07/20/2018   Procedure: Left Knee Arthroplasty;  Surgeon: Gean Birchwood, MD;  Location: [facility];  Service: Orthopedics;  Laterality: Left;   Allergies  Allergen Reactions   Crestor [Rosuvastatin] Other (See Comments)    Muscle aches   Neomycin-Bacitracin Zn-Polymyx Swelling    OPTHALMIC EXPOSURE ONLY - - SWELLING EYES    Neomycin-Bacitracin-Polymyxin [Bacitracin-Neomycin-Polymyxin] Swelling    Reaction to eye drops   Codeine Rash   Feldene [Piroxicam] Rash   Penicillins Rash    Has patient had a PCN reaction causing immediate rash, facial/tongue/throat swelling, SOB or lightheadedness with hypotension:Yes Has patient had a PCN reaction causing severe rash involving mucus membranes or skin necrosis: No Has patient had a PCN reaction that required hospitalization:No Has patient had a PCN reaction occurring within the last 10 years:No If all of the above answers are "NO", then may proceed with Cephalosporin use.    Piroxicam Rash   Sulfa Antibiotics Rash   Sulfonamide Derivatives Rash   Prior to Admission medications   Medication Sig Start Date End Date Taking? Authorizing Provider  Biotin 1000 MCG tablet Take 1,000 mcg by mouth daily.   Yes [provider]  CALCIUM CARBONATE PO Take 600 mg by mouth daily.    Yes [provider]  cetirizine (ZYRTEC) 10 MG tablet Take 10 mg by mouth every evening.    Yes [provider]  Cholecalciferol (VITAMIN D) 50 MCG (2000 UT) tablet Take 2,000 Units by mouth daily.   Yes [provider]  citalopram (CELEXA) 20 MG tablet Take 1 tablet (20 mg total) by mouth daily. 09/25/22  Yes Shade Flood, MD  ezetimibe (ZETIA) 10 MG tablet Take 1 tablet (10 mg total) by mouth daily. 06/24/22  Yes Shade Flood, MD  hydroxypropyl methylcellulose / hypromellose (ISOPTO TEARS / GONIOVISC)  2.5 % ophthalmic solution Place 1 drop into both eyes 2 (two) times daily as needed for dry eyes.   Yes [provider]  levETIRAcetam (KEPPRA) 500 MG tablet Take 1 tablet by mouth twice daily 09/11/22  Yes Van Clines, MD  levothyroxine (SYNTHROID) 50 MCG tablet TAKE 1 TABLET BY MOUTH EVERY DAY 02/20/22  Yes Shade Flood, MD  losartan (COZAAR) 25 MG tablet TAKE 2 TABLETS BY MOUTH EVERY DAY 09/12/22  Yes Shade Flood, MD  Multiple Vitamins-Minerals (MULTIPLE VITAMINS/WOMENS PO) Take 1 tablet by mouth daily.    Yes [provider]  omeprazole (PRILOSEC) 20 MG capsule Take 1 capsule (20 mg total) by mouth daily. 09/23/22  Yes Shade Flood, MD  pravastatin (PRAVACHOL) 80 MG tablet Take 1 tablet (80 mg total) by mouth daily. 12/10/21  Yes Shade Flood, MD   Social History   Socioeconomic History   Marital  status: Divorced    Spouse name: Not on file   Number of children: 1   Years of education: Not on file   Highest education level: Associate degree: occupational, Scientist, product/process development, or vocational program  Occupational History   Occupation: Retired    Associate Professor: [employer]  Tobacco Use   Smoking status: Never   Smokeless tobacco: Never  Vaping Use   Vaping status: Never Used  Substance and Sexual Activity   Alcohol use: No    Alcohol/week: 0.0 standard drinks of alcohol   Drug use: No   Sexual activity: Never    Birth control/protection: Post-menopausal  Other Topics Concern   Not on file  Social History Narrative   Lives with blind dog and cat.    Right handed    Social Determinants of Health   Financial Resource Strain: Low Risk  (06/21/2022)   Overall Financial Resource Strain (CARDIA)    Difficulty of Paying Living Expenses: Not hard at all  Food Insecurity: No Food Insecurity (06/21/2022)   Hunger Vital Sign    Worried About Running Out of Food in the Last Year: Never true    Ran Out of Food in the Last Year: Never true  Transportation Needs: No  Transportation Needs (06/21/2022)   PRAPARE - Administrator, Civil Service (Medical): No    Lack of Transportation (Non-Medical): No  Physical Activity: Inactive (06/21/2022)   Exercise Vital Sign    Days of Exercise per Week: 0 days    Minutes of Exercise per Session: 0 min  Stress: No Stress Concern Present (06/21/2022)   [employer] of Occupational Health - Occupational Stress Questionnaire    Feeling of Stress : Only a little  Social Connections: Moderately Integrated (06/21/2022)   Social Connection and Isolation Panel [NHANES]    Frequency of Communication with Friends and Family: More than three times a week    Frequency of Social Gatherings with Friends and Family: Once a week    Attends Religious Services: More than 4 times per year    Active Member of [employer] or Organizations: Yes    Attends Banker Meetings: More than 4 times per year    Marital Status: Divorced  Intimate Partner Violence: Not At Risk (11/07/2021)   Humiliation, Afraid, Rape, and Kick questionnaire    Fear of Current or Ex-Partner: No    Emotionally Abused: No    Physically Abused: No    Sexually Abused: No    Review of Systems   Objective:   Vitals:   09/30/22 1340 09/30/22 1408  BP: (!) 140/80 130/78  Pulse: 65   Temp: 98.7 F (37.1 C)   TempSrc: Oral   SpO2: 97%   Weight: 189 lb 9.6 oz (86 kg)      Physical Exam Vitals reviewed.  Constitutional:      Appearance: Normal appearance. She is well-developed.  HENT:     Head: Normocephalic and atraumatic.  Eyes:     Conjunctiva/sclera: Conjunctivae normal.     Pupils: Pupils are equal, round, and reactive to light.  Neck:     Vascular: No carotid bruit.  Cardiovascular:     Rate and Rhythm: Normal rate and regular rhythm.     Heart sounds: Normal heart sounds.  Pulmonary:     Effort: Pulmonary effort is normal.     Breath sounds: Normal breath sounds.  Abdominal:     General: There is no distension.      Palpations: Abdomen is soft.  There is no pulsatile mass.     Tenderness: There is abdominal tenderness (Epigastric, left upper quadrant, without rebound or guarding.  No distention.). There is no guarding or rebound.  Musculoskeletal:     Right lower leg: No edema.     Left lower leg: No edema.  Skin:    General: Skin is warm and dry.  Neurological:     Mental Status: She is alert and oriented to person, place, and time.  Psychiatric:        Mood and Affect: Mood normal.        Behavior: Behavior normal.      Assessment & Plan:  Jennifer [name] is a 72 y.o. female . LUQ abdominal pain - Plan: Ambulatory referral to Gastroenterology, sucralfate (CARAFATE) 1 g tablet  Epigastric abdominal pain - Plan: Ambulatory referral to Gastroenterology, omeprazole (PRILOSEC) 20 MG capsule, sucralfate (CARAFATE) 1 g tablet  Heartburn - Plan: omeprazole (PRILOSEC) 20 MG capsule  Fatigue, unspecified type  Hyponatremia - Plan: Basic metabolic panel  Slight improvement of abdominal pain.  No stool changes, no fever, reassuring labs last visit.  Borderline hyponatremia which will be rechecked today.  With NSAID use and area of symptoms suspicious for NSAID induced gastritis versus PUD.  Will increase omeprazole to twice daily dosing for now, add Carafate, continue to avoid NSAIDs.  Discussed reasoning for lower dose citalopram with use of omeprazole.  If worsening new symptoms can change PPI and return to higher dose.  Refer to gastroenterology to decide on endoscopy or other testing, and 2-week recheck with RTC precautions if worsening sooner.  Meds ordered this encounter  Medications   omeprazole (PRILOSEC) 20 MG capsule    Sig: Take 1 capsule (20 mg total) by mouth daily.    Dispense:  60 capsule    Refill:  1   sucralfate (CARAFATE) 1 g tablet    Sig: Take 1 tablet (1 g total) by mouth 4 (four) times daily -  with meals and at bedtime.    Dispense:  56 tablet    Refill:  0   Patient  Instructions  Switch to lower dose of citalopram for now while taking omeprazole.  Try increasing omeprazole to twice per day.  Start carafate.  I will refer you to gastroenterology.  Continue to avoid NSAIDS.   If no concerns on CPAP, let me know and we can look at other cause fatigue. Return to the clinic or go to the nearest emergency room if any of your symptoms worsen or new symptoms occur.  Thanks for coming in today.  Let me know if there are questions.  Abdominal Pain, Adult  Pain in the abdomen (abdominal pain) can be caused by many things. In most cases, it gets better with no treatment or by being treated at home. But in some cases, it can be serious. Your health care provider will ask questions about your medical history and do a physical exam to try to figure out what is causing your pain. Follow these instructions at home: Medicines Take over-the-counter and prescription medicines only as told by your provider. Do not take medicines that help you poop (laxatives) unless told by your provider. General instructions Watch your condition for any changes. Drink enough fluid to keep your pee (urine) pale yellow. Contact a health care provider if: Your pain changes, gets worse, or lasts longer than expected. You have severe cramping or bloating in your abdomen, or you vomit. Your pain gets worse with meals,  after eating, or with certain foods. You are constipated or have diarrhea for more than 2-3 days. You are not hungry, or you lose weight without trying. You have signs of dehydration. These may include: Dark pee, very little pee, or no pee. Cracked lips or dry mouth. Sleepiness or weakness. You have pain when you pee (urinate) or poop. Your abdominal pain wakes you up at night. You have blood in your pee. You have a fever. Get help right away if: You cannot stop vomiting. Your pain is only in one part of the abdomen. Pain on the right side could be caused by  appendicitis. You have bloody or black poop (stool), or poop that looks like tar. You have trouble breathing. You have chest pain. These symptoms may be an emergency. Get help right away. Call 911. Do not wait to see if the symptoms will go away. Do not drive yourself to the hospital. This information is not intended to replace advice given to you by your health care provider. Make sure you discuss any questions you have with your health care provider. Document Revised: 10/10/2021 Document Reviewed: 10/10/2021 Elsevier Patient Education  2024 [employer]     Signed,   Meredith Staggers, MD [facility], [facility] 09/30/22 2:41 PM

## 2022-10-01 LAB — BASIC METABOLIC PANEL
BUN: 13 mg/dL (ref 6–23)
CO2: 29 mEq/L (ref 19–32)
Calcium: 9.3 mg/dL (ref 8.4–10.5)
Chloride: 99 mEq/L (ref 96–112)
Creatinine, Ser: 0.63 mg/dL (ref 0.40–1.20)
GFR: 89.09 mL/min (ref >60.00)
Glucose, Bld: 72 mg/dL (ref 70–99)
Potassium: 4.4 mEq/L (ref 3.5–5.1)
Sodium: 135 mEq/L (ref 135–145)

## 2022-10-14 ENCOUNTER — Ambulatory Visit: Payer: Medicare Other | Admitting: Family Medicine

## 2022-10-14 VITALS — BP 126/60 | HR 70 | Temp 98.8°F | Ht 63.0 in | Wt 190.2 lb

## 2022-10-14 DIAGNOSIS — R1012 Left upper quadrant pain: Secondary | ICD-10-CM

## 2022-10-14 DIAGNOSIS — K5790 Diverticulosis of intestine, part unspecified, without perforation or abscess without bleeding: Secondary | ICD-10-CM | POA: Diagnosis not present

## 2022-10-14 DIAGNOSIS — R5383 Other fatigue: Secondary | ICD-10-CM

## 2022-10-14 DIAGNOSIS — R5381 Other malaise: Secondary | ICD-10-CM | POA: Diagnosis not present

## 2022-10-14 LAB — POCT URINALYSIS DIP (MANUAL ENTRY)
Bilirubin, UA: NEGATIVE
Glucose, UA: NEGATIVE mg/dL
Ketones, POC UA: NEGATIVE mg/dL
Leukocytes, UA: NEGATIVE
Nitrite, UA: NEGATIVE
Protein Ur, POC: NEGATIVE mg/dL
Spec Grav, UA: 1.01 (ref 1.010–1.025)
Urobilinogen, UA: 0.2 U/dL
pH, UA: 6 (ref 5.0–8.0)

## 2022-10-14 LAB — CBC
HCT: 38.4 % (ref 36.0–46.0)
Hemoglobin: 12.9 g/dL (ref 12.0–15.0)
MCHC: 33.7 g/dL (ref 30.0–36.0)
MCV: 91.1 fL (ref 78.0–100.0)
Platelets: 316 10*3/uL (ref 150.0–400.0)
RBC: 4.21 Mil/uL (ref 3.87–5.11)
RDW: 12.8 % (ref 11.5–15.5)
WBC: 5.8 10*3/uL (ref 4.0–10.5)

## 2022-10-14 NOTE — Patient Instructions (Signed)
Continue same meds for now.  I am rechecking some labs from today and we will order the CT abdomen pelvis to see if there is another cause of your pain.  Particularly I am looking to see if there could be diverticulitis.  Once I receive the results we will discuss next step.  If there are questions in the meantime but be seen if any new or worsening symptoms.  Hang in there.

## 2022-10-14 NOTE — Progress Notes (Unsigned)
Subjective:  Patient ID: Jennifer [name], female    DOB: 06/02/1950  Age: 72 y.o. MRN: [id number]  CC:  Chief Complaint  Patient presents with   Abdominal Pain    Pt notes continued pain and notes feeling very abnormal, fatigue, no cough vomiting diarrhea fever or other sxs but does not feel well     HPI LEXXUS UNDERHILL presents for   Abdominal pain: Initially seen September 16 with fatigue, upper abdominal pain that we can proceed in that appointment, left upper quadrant for a few days at that time, had been using ibuprofen most days over the previous months, concern for possible NSAID induced gastritis versus PUD.  Will start on omeprazole and advised to stop NSAIDs.  CBC, CMP and lipase were normal except borderline sodium at 133.  Citalopram dose was decreased to 20 mg daily given interaction with omeprazole. September 23 visit was improving with less severe pain but still some abdominal discomfort at that time.  She does use CPAP and had received message to adjust mask, possible contributor to fatigue.  Referred to gastroenterology, added Carafate and continued omeprazole - incr to BID.   Since last visit - still feeling upper abdominal sharp pain. Carafate 4 times per day, taking omeprazole BID.  Pain about the same. Does not feel right. Fatigue, still episodic pain. No diarrhea, dark stool or blood in stool. Softer stool, no diarrhea, just not hard stool. GI appt not until January.  Sigmoid diverticulosis on colonoscopy in 2021 - no prior diverticulitis.  Slight nausea, no vomiting. Nausea with pain few days ago, loss of appetite - past few weeks. No weight loss.  No fever, no night sweats, but feels flushed and cold at times during the day.  No hematuria, dysuria, frequency.   No CP/dyspnea.  Wt Readings from Last 3 Encounters:  10/14/22 190 lb 3.2 oz (86.3 kg)  09/30/22 189 lb 9.6 oz (86 kg)  09/23/22 185 lb 6.4 oz (84.1 kg)      History Patient Active Problem  List   Diagnosis Date Noted   Other hyperlipidemia 06/12/2022   Stress 04/17/2022   BMI 31.0-31.9,adult 02/20/2022   Obesity, Beginning BMI 36.8 02/20/2022   Hypothyroidism 12/17/2021   Mouth abscess 12/17/2021   Hyperglycemia 12/17/2021   Pain of right hip 10/01/2021   Mixed hyperlipidemia 06/06/2021   Insulin resistance 06/06/2021   Vitamin D deficiency 05/07/2021   Elevated LFTs 11/01/2019   Abrasion of left buttock 08/05/2018   S/P total knee arthroplasty, left 07/20/2018   Degenerative arthritis of left knee 07/17/2018   Chronic constipation 12/20/2017   Class 2 severe obesity with serious comorbidity and body mass index (BMI) of 36.0 to 36.9 in adult [facility]) 12/20/2017   Primary osteoarthritis of right hip 10/20/2015   Arthritis of right hip 10/20/2015   Localization-related idiopathic epilepsy and epileptic syndromes with seizures of localized onset, not intractable, without status epilepticus (HCC) 09/09/2014   Depression 07/19/2014   Hx of adenomatous polyp of colon 03/29/2014   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 PAC'S  COMES AND GOES   Gallbladder problem    GERD (gastroesophageal reflux disease)    High cholesterol    Hx of adenomatous polyp of colon    2012 - [place]   Hyperlipidemia    Hypertension    Hypothyroid    Joint pain    no current problems   Osteoarthritis    PLMD (periodic limb movement disorder) 04/24/2012   Prediabetes    Seizures (HCC) 08/29/2013   only 1 seizure episode   Sleep apnea    wears CPAP nightly   Vitamin D deficiency    Past Surgical  History:  Procedure Laterality Date   APPENDECTOMY     CESAREAN SECTION      x1   CHOLECYSTECTOMY  1978   COLONOSCOPY     DILATION AND CURETTAGE OF UTERUS     JOINT REPLACEMENT  1998   right knee   OSTEOTOMY PROXIMAL FEMORAL     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   Procedure: TOTAL HIP ARTHROPLASTY ANTERIOR APPROACH;  Surgeon: Gean Birchwood, MD;  Location: [facility] OR;  Service: Orthopedics;  Laterality: Right;   TOTAL KNEE ARTHROPLASTY Left 07/20/2018   Procedure: Left Knee Arthroplasty;  Surgeon: Gean Birchwood, MD;  Location: [facility];  Service: Orthopedics;  Laterality: Left;   Allergies  Allergen Reactions   Crestor [Rosuvastatin] Other (See Comments)    Muscle aches   Neomycin-Bacitracin Zn-Polymyx Swelling    OPTHALMIC EXPOSURE ONLY - - SWELLING EYES    Neomycin-Bacitracin-Polymyxin [Bacitracin-Neomycin-Polymyxin] Swelling    Reaction to eye drops   Codeine Rash   Feldene [Piroxicam] Rash   Penicillins Rash    Has patient had a PCN reaction causing immediate rash, facial/tongue/throat swelling, SOB or lightheadedness with hypotension:Yes Has patient had a PCN reaction causing severe rash involving mucus membranes or skin necrosis: No Has patient had a PCN reaction that required hospitalization:No Has patient had a PCN reaction occurring within the last 10 years:No If all of the above answers are "NO", then may proceed with Cephalosporin use.    Piroxicam Rash   Sulfa Antibiotics Rash   Sulfonamide Derivatives Rash   Prior to Admission medications   Medication Sig Start Date End Date Taking? Authorizing Provider  Biotin 1000 MCG tablet Take 1,000 mcg by mouth daily.   Yes [provider]  CALCIUM CARBONATE PO Take 600 mg by mouth daily.    Yes [provider]  cetirizine (ZYRTEC) 10 MG tablet Take 10 mg by mouth every evening.    Yes [provider]  Cholecalciferol (VITAMIN D) 50 MCG (2000 UT) tablet Take 2,000 Units by mouth daily.   Yes  [provider]  citalopram (CELEXA) 20 MG tablet Take 1 tablet (20 mg total) by mouth daily. 09/25/22  Yes Shade Flood, MD  ezetimibe (ZETIA) 10 MG tablet Take 1 tablet (10 mg total) by mouth daily. 06/24/22  Yes Shade Flood, MD  hydroxypropyl methylcellulose / hypromellose (ISOPTO TEARS / GONIOVISC) 2.5 % ophthalmic solution Place 1 drop into both eyes 2 (two) times daily as needed for dry eyes.   Yes [provider]  levETIRAcetam (KEPPRA) 500 MG tablet Take 1 tablet by mouth twice daily 09/11/22  Yes Van Clines, MD  levothyroxine (SYNTHROID) 50 MCG tablet TAKE 1 TABLET BY MOUTH EVERY DAY 02/20/22  Yes Shade Flood, MD  losartan (COZAAR) 25 MG tablet TAKE 2 TABLETS BY MOUTH EVERY DAY 09/12/22  Yes Shade Flood, MD  Multiple Vitamins-Minerals (MULTIPLE VITAMINS/WOMENS PO) Take  1 tablet by mouth daily.    Yes [provider]  omeprazole (PRILOSEC) 20 MG capsule Take 1 capsule (20 mg total) by mouth daily. 09/30/22  Yes Shade Flood, MD  pravastatin (PRAVACHOL) 80 MG tablet Take 1 tablet (80 mg total) by mouth daily. 12/10/21  Yes Shade Flood, MD  sucralfate (CARAFATE) 1 g tablet Take 1 tablet (1 g total) by mouth 4 (four) times daily -  with meals and at bedtime. 09/30/22  Yes Shade Flood, MD   Social History   Socioeconomic History   Marital status: Divorced    Spouse name: Not on file   Number of children: 1   Years of education: Not on file   Highest education level: Associate degree: occupational, Scientist, product/process development, or vocational program  Occupational History   Occupation: Retired    Associate Professor: [employer]  Tobacco Use   Smoking status: Never   Smokeless tobacco: Never  Vaping Use   Vaping status: Never Used  Substance and Sexual Activity   Alcohol use: No    Alcohol/week: 0.0 standard drinks of alcohol   Drug use: No   Sexual activity: Never    Birth control/protection: Post-menopausal  Other Topics Concern   Not on file   Social History Narrative   Lives with blind dog and cat.    Right handed    Social Determinants of Health   Financial Resource Strain: Low Risk  (06/21/2022)   Overall Financial Resource Strain (CARDIA)    Difficulty of Paying Living Expenses: Not hard at all  Food Insecurity: No Food Insecurity (06/21/2022)   Hunger Vital Sign    Worried About Running Out of Food in the Last Year: Never true    Ran Out of Food in the Last Year: Never true  Transportation Needs: No Transportation Needs (06/21/2022)   PRAPARE - Administrator, Civil Service (Medical): No    Lack of Transportation (Non-Medical): No  Physical Activity: Inactive (06/21/2022)   Exercise Vital Sign    Days of Exercise per Week: 0 days    Minutes of Exercise per Session: 0 min  Stress: No Stress Concern Present (06/21/2022)   [employer] of Occupational Health - Occupational Stress Questionnaire    Feeling of Stress : Only a little  Social Connections: Moderately Integrated (06/21/2022)   Social Connection and Isolation Panel [NHANES]    Frequency of Communication with Friends and Family: More than three times a week    Frequency of Social Gatherings with Friends and Family: Once a week    Attends Religious Services: More than 4 times per year    Active Member of [employer] or Organizations: Yes    Attends Engineer, structural: More than 4 times per year    Marital Status: Divorced  Intimate Partner Violence: Not At Risk (11/07/2021)   Humiliation, Afraid, Rape, and Kick questionnaire    Fear of Current or Ex-Partner: No    Emotionally Abused: No    Physically Abused: No    Sexually Abused: No    Review of Systemsper HPI  Objective:   Vitals:   10/14/22 1320  BP: 126/60  Pulse: 70  Temp: 98.8 F (37.1 C)  TempSrc: Temporal  SpO2: 94%  Weight: 190 lb 3.2 oz (86.3 kg)  Height: 5\' 3"  (1.6 m)     Physical Exam Vitals reviewed.  Constitutional:      Appearance: Normal appearance. She is  well-developed.  HENT:     Head: Normocephalic  and atraumatic.  Eyes:     Conjunctiva/sclera: Conjunctivae normal.     Pupils: Pupils are equal, round, and reactive to light.  Neck:     Vascular: No carotid bruit.  Cardiovascular:     Rate and Rhythm: Normal rate and regular rhythm.     Heart sounds: Normal heart sounds.  Pulmonary:     Effort: Pulmonary effort is normal.     Breath sounds: Normal breath sounds.  Abdominal:     General: There is no distension.     Palpations: Abdomen is soft. There is no pulsatile mass.     Tenderness: There is abdominal tenderness (Left upper quadrant with deep palpation, no rebound or guarding, chest wall, ribs nontender.).  Musculoskeletal:     Right lower leg: No edema.     Left lower leg: No edema.  Skin:    General: Skin is warm and dry.  Neurological:     Mental Status: She is alert and oriented to person, place, and time.  Psychiatric:        Mood and Affect: Mood normal.        Behavior: Behavior normal.     Results for orders placed or performed in visit on 10/14/22  POCT urinalysis dipstick  Result Value Ref Range   Color, UA light yellow (A) yellow   Clarity, UA clear clear   Glucose, UA negative negative mg/dL   Bilirubin, UA negative negative   Ketones, POC UA negative negative mg/dL   Spec Grav, UA 1.610 9.604 - 1.025   Blood, UA trace-intact (A) negative   pH, UA 6.0 5.0 - 8.0   Protein Ur, POC negative negative mg/dL   Urobilinogen, UA 0.2 0.2 or 1.0 E.U./dL   Nitrite, UA Negative Negative   Leukocytes, UA Negative Negative      Assessment & Plan:  GULIANNA HORNSBY is a 72 y.o. female . Malaise and fatigue - Plan: CT ABDOMEN PELVIS W CONTRAST, POCT urinalysis dipstick  LUQ abdominal pain - Plan: CBC, Comprehensive metabolic panel, Lipase, CT ABDOMEN PELVIS W CONTRAST, POCT urinalysis dipstick  Diverticulosis - Plan: CT ABDOMEN PELVIS W CONTRAST  Persistent left upper quadrant pain, intermittent at times  but has persisted along with fatigue and malaise.  Initially treated for possible NSAID induced gastritis with only slight improvement.  History of diverticulosis, differential includes diverticulitis but previous lab work was reassuring.  Repeat CBC, CMP, lipase and check CT abdomen pelvis to determine next step.  ER precautions given.  Urinalysis noted, no apparent infection.  Will check CT abdomen as above.  No orders of the defined types were placed in this encounter.  Patient Instructions  Continue same meds for now.  I am rechecking some labs from today and we will order the CT abdomen pelvis to see if there is another cause of your pain.  Particularly I am looking to see if there could be diverticulitis.  Once I receive the results we will discuss next step.  If there are questions in the meantime but be seen if any new or worsening symptoms.  Hang in there.    Signed,   Meredith Staggers, MD [facility] 10/14/22 2:13 PM

## 2022-10-15 ENCOUNTER — Other Ambulatory Visit: Payer: Medicare Other

## 2022-10-15 ENCOUNTER — Encounter: Payer: Self-pay | Admitting: Family Medicine

## 2022-10-15 LAB — COMPREHENSIVE METABOLIC PANEL
ALT: 20 U/L (ref 0–35)
AST: 20 U/L (ref 0–37)
Albumin: 4.4 g/dL (ref 3.5–5.2)
Alkaline Phosphatase: 73 U/L (ref 39–117)
BUN: 22 mg/dL (ref 6–23)
CO2: 29 meq/L (ref 19–32)
Calcium: 9.7 mg/dL (ref 8.4–10.5)
Chloride: 98 meq/L (ref 96–112)
Creatinine, Ser: 0.64 mg/dL (ref 0.40–1.20)
GFR: 88.73 mL/min (ref >60.00)
Glucose, Bld: 77 mg/dL (ref 70–99)
Potassium: 4.3 meq/L (ref 3.5–5.1)
Sodium: 134 meq/L — ABNORMAL LOW (ref 135–145)
Total Bilirubin: 0.3 mg/dL (ref 0.2–1.2)
Total Protein: 6.8 g/dL (ref 6.0–8.3)

## 2022-10-15 LAB — LIPASE: Lipase: 27 U/L (ref 11.0–59.0)

## 2022-10-16 ENCOUNTER — Inpatient Hospital Stay
Admission: RE | Admit: 2022-10-16 | Discharge: 2022-10-16 | Discharge status: Home / Self Care | Payer: Medicare Other | Source: Ambulatory Visit | Attending: Family Medicine | Admitting: Family Medicine

## 2022-10-16 DIAGNOSIS — K5790 Diverticulosis of intestine, part unspecified, without perforation or abscess without bleeding: Secondary | ICD-10-CM

## 2022-10-16 DIAGNOSIS — R1012 Left upper quadrant pain: Secondary | ICD-10-CM

## 2022-10-16 DIAGNOSIS — R630 Anorexia: Secondary | ICD-10-CM | POA: Diagnosis not present

## 2022-10-16 DIAGNOSIS — R5381 Other malaise: Secondary | ICD-10-CM | POA: Diagnosis not present

## 2022-10-16 MED ORDER — IOPAMIDOL (ISOVUE-300) INJECTION 61%
100.0000 mL | Freq: Once | INTRAVENOUS | Status: AC | PRN
  Administered 2022-10-16: 100 mL via INTRAVENOUS

## 2022-10-23 ENCOUNTER — Other Ambulatory Visit: Payer: Self-pay | Admitting: Family Medicine

## 2022-10-23 DIAGNOSIS — F329 Major depressive disorder, single episode, unspecified: Secondary | ICD-10-CM

## 2022-10-30 ENCOUNTER — Encounter (INDEPENDENT_AMBULATORY_CARE_PROVIDER_SITE_OTHER): Payer: Self-pay | Admitting: Family Medicine

## 2022-10-30 ENCOUNTER — Ambulatory Visit (INDEPENDENT_AMBULATORY_CARE_PROVIDER_SITE_OTHER): Payer: Medicare Other | Admitting: Family Medicine

## 2022-10-30 VITALS — BP 126/76 | HR 63 | Temp 98.1°F | Ht 63.0 in | Wt 184.0 lb

## 2022-10-30 DIAGNOSIS — K5909 Other constipation: Secondary | ICD-10-CM

## 2022-10-30 DIAGNOSIS — Z23 Encounter for immunization: Secondary | ICD-10-CM | POA: Diagnosis not present

## 2022-10-30 DIAGNOSIS — Z6832 Body mass index (BMI) 32.0-32.9, adult: Secondary | ICD-10-CM | POA: Diagnosis not present

## 2022-10-30 DIAGNOSIS — E669 Obesity, unspecified: Secondary | ICD-10-CM | POA: Diagnosis not present

## 2022-10-30 DIAGNOSIS — K5900 Constipation, unspecified: Secondary | ICD-10-CM | POA: Diagnosis not present

## 2022-10-30 MED ORDER — POLYETHYLENE GLYCOL 3350 17 G PO PACK
17.0000 g | PACK | Freq: Every day | ORAL | 0 refills | Status: DC
Start: 2022-10-30 — End: 2022-12-25

## 2022-10-30 NOTE — Progress Notes (Signed)
.smr  Office: [phone]  /  Fax: [phone]  WEIGHT SUMMARY AND BIOMETRICS  Anthropometric Measurements Height: 5\' 3"  (1.6 m) Weight: 184 lb (83.5 kg) BMI (Calculated): 32.6 Weight at Last Visit: 160 lb Weight Lost Since Last Visit: 0 Weight Gained Since Last Visit: 5 lb Starting Weight: 208 lb Total Weight Loss (lbs): 24 lb (10.9 kg) Peak Weight: 250 lb   Body Composition  Body Fat %: 43.8 % Fat Mass (lbs): 80.6 lbs Muscle Mass (lbs): 98.2 lbs Total Body Water (lbs): 74.6 lbs Visceral Fat Rating : 13   Other Clinical Data Fasting: yes Labs: no Today's Visit #: 29 Starting Date: 08/25/18    Chief Complaint: OBESITY   History of Present Illness   The patient, with a history of obesity, presents with a recent weight gain of five pounds over the last two months. She reports adherence to her category one eating plan only 20% of the time and is currently not exercising. Over the past two months, the patient has been feeling unwell, experiencing an unidentified pain in the left upper quadrant of her abdomen. This discomfort led to a CT scan of the abdomen, which returned normal results. Blood work was also conducted, revealing a temporary drop in sodium levels that subsequently normalized.  The patient reports a significant decrease in appetite during this period, which she found concerning given her usual eating habits. The abdominal pain was severe enough at times to cause her to double over. Despite these symptoms, the patient reports that outwardly, she appeared normal to others.  The patient also has a history of chronic constipation, which she has not actively managed. She has attempted to increase her intake of high-fiber foods such as raspberries, blackberries, peas, and broccoli. However, she reports that these dietary changes have not significantly improved her constipation.  The patient acknowledges the psychological impact of her recent health issues and weight  gain, expressing feelings of guilt and frustration. She reports feeling out of sync with her usual self and is concerned about the upcoming holiday season, which she anticipates could further disrupt her eating and exercise habits.  Despite these challenges, the patient expresses a desire to get back on track with her health and wellness program. She has previously found success with a structured eating plan and regular exercise, including swimming and walking. She is also considering adding a daily half dose of MiraLAX to her regimen to manage her chronic constipation.          PHYSICAL EXAM:  Blood pressure 126/76, pulse 63, temperature 98.1 F (36.7 C), height 5\' 3"  (1.6 m), weight 184 lb (83.5 kg), SpO2 94%. Body mass index is 32.59 kg/m.  DIAGNOSTIC DATA REVIEWED:  BMET    Component Value Date/Time   NA 134 (L) 10/14/2022 1422   NA 138 06/12/2022 1021   K 4.3 10/14/2022 1422   CL 98 10/14/2022 1422   CO2 29 10/14/2022 1422   GLUCOSE 77 10/14/2022 1422   BUN 22 10/14/2022 1422   BUN 17 06/12/2022 1021   CREATININE 0.64 10/14/2022 1422   CREATININE 0.72 09/02/2015 1207   CALCIUM 9.7 10/14/2022 1422   GFRNONAA 84 01/17/2020 1106   GFRNONAA 89 09/02/2015 1207   GFRAA 96 01/17/2020 1106   GFRAA >89 09/02/2015 1207   Lab Results  Component Value Date   HGBA1C 5.5 06/12/2022   HGBA1C 5.9 03/13/2016   Lab Results  Component Value Date   INSULIN 8.0 06/12/2022   INSULIN 14.7 08/25/2018  Lab Results  Component Value Date   TSH 2.570 06/12/2022   CBC    Component Value Date/Time   WBC 5.8 10/14/2022 1422   RBC 4.21 10/14/2022 1422   HGB 12.9 10/14/2022 1422   HGB 13.6 06/12/2022 1021   HCT 38.4 10/14/2022 1422   HCT 40.5 06/12/2022 1021   PLT 316.0 10/14/2022 1422   PLT 314 06/12/2022 1021   MCV 91.1 10/14/2022 1422   MCV 90 06/12/2022 1021   MCH 30.4 06/12/2022 1021   MCH 31.4 07/21/2018 0304   MCHC 33.7 10/14/2022 1422   RDW 12.8 10/14/2022 1422   RDW  11.9 06/12/2022 1021   Iron Studies    Component Value Date/Time   IRON 110 11/01/2019 0853   TIBC 382 11/01/2019 0853   IRONPCTSAT 29 11/01/2019 0853   Lipid Panel     Component Value Date/Time   CHOL 237 (H) 06/12/2022 1021   TRIG 208 (H) 06/12/2022 1021   HDL 56 06/12/2022 1021   CHOLHDL 3.8 12/25/2020 1230   CHOLHDL 5 05/04/2018 0805   VLDL 78.4 (H) 05/04/2018 0805   LDLCALC 144 (H) 06/12/2022 1021   LDLDIRECT 96.0 05/04/2018 0805   Hepatic Function Panel     Component Value Date/Time   PROT 6.8 10/14/2022 1422   PROT 7.1 06/12/2022 1021   ALBUMIN 4.4 10/14/2022 1422   ALBUMIN 4.6 06/12/2022 1021   AST 20 10/14/2022 1422   ALT 20 10/14/2022 1422   ALKPHOS 73 10/14/2022 1422   BILITOT 0.3 10/14/2022 1422   BILITOT 0.6 06/12/2022 1021      Component Value Date/Time   TSH 2.570 06/12/2022 1021   Nutritional Lab Results  Component Value Date   VD25OH 40.8 06/12/2022   VD25OH 45.9 12/17/2021   VD25OH 56.0 06/06/2021     Assessment and Plan    Obesity and constipation Weight gain of 5 pounds over the last two months. Adherence to category one eating plan has been poor and there has been no exercise. Recent abdominal pain and CT scan showed heavy stool load, suggesting constipation. -Start half dose of MiraLAX daily to alleviate constipation. -Encourage resumption of regular exercise, such as swimming or walking. -Plan to increase intake of high fiber foods like peas and broccoli. -restart category 1 eating plan and work on increasing exercise -Return visit in 3-4 weeks to assess progress and adjust plan as needed.      I have personally spent 30 minutes total time today in preparation, patient care, and documentation for this visit, including the following: review of clinical lab tests; review of medical tests/procedures/services.    She was informed of the importance of frequent follow up visits to maximize her success with intensive lifestyle modifications  for her multiple health conditions.    Quillian Quince, MD

## 2022-11-20 ENCOUNTER — Ambulatory Visit (INDEPENDENT_AMBULATORY_CARE_PROVIDER_SITE_OTHER): Payer: Medicare Other | Admitting: Family Medicine

## 2022-11-23 ENCOUNTER — Other Ambulatory Visit: Payer: Self-pay | Admitting: Family Medicine

## 2022-11-23 DIAGNOSIS — E782 Mixed hyperlipidemia: Secondary | ICD-10-CM

## 2022-11-23 DIAGNOSIS — E119 Type 2 diabetes mellitus without complications: Secondary | ICD-10-CM

## 2022-11-25 ENCOUNTER — Encounter: Payer: Self-pay | Admitting: *Deleted

## 2022-11-26 ENCOUNTER — Telehealth (INDEPENDENT_AMBULATORY_CARE_PROVIDER_SITE_OTHER): Payer: Medicare Other | Admitting: Adult Health

## 2022-11-26 DIAGNOSIS — G4733 Obstructive sleep apnea (adult) (pediatric): Secondary | ICD-10-CM

## 2022-11-26 NOTE — Progress Notes (Signed)
PATIENT: Jennifer [name] DOB: 05/16/1950  REASON FOR VISIT: follow up HISTORY FROM: patient   Virtual Visit via Video Note  I connected with Jennifer [name] on 11/26/22 at  3:15 PM EST by a video enabled telemedicine application located remotely at [facility] and verified that I am speaking with the correct person using two identifiers who was located at their own home.   I discussed the limitations of evaluation and management by telemedicine and the availability of in person appointments. The patient expressed understanding and agreed to proceed.   PATIENT: Jennifer [name] DOB: August 29, 1950  REASON FOR VISIT: follow up HISTORY FROM: patient  HISTORY OF PRESENT ILLNESS: Today 11/26/22:  Jennifer [name] is a 72 y.o. female with a history of OSA on CPAP. Returns today for follow-up. CPAP is working well. Denies any new issues.        REVIEW OF SYSTEMS: Out of a complete 14 system review of symptoms, the patient complains only of the following symptoms, and all other reviewed systems are negative.  ALLERGIES: Allergies  Allergen Reactions   Crestor [Rosuvastatin] Other (See Comments)    Muscle aches   Neomycin-Bacitracin Zn-Polymyx Swelling    OPTHALMIC EXPOSURE ONLY - - SWELLING EYES    Neomycin-Bacitracin-Polymyxin [Bacitracin-Neomycin-Polymyxin] Swelling    Reaction to eye drops   Codeine Rash   Feldene [Piroxicam] Rash   Penicillins Rash    Has patient had a PCN reaction causing immediate rash, facial/tongue/throat swelling, SOB or lightheadedness with hypotension:Yes Has patient had a PCN reaction causing severe rash involving mucus membranes or skin necrosis: No Has patient had a PCN reaction that required hospitalization:No Has patient had a PCN reaction occurring within the last 10 years:No If all of the above answers are "NO", then may proceed with Cephalosporin use.    Piroxicam Rash   Sulfa Antibiotics Rash    Sulfonamide Derivatives Rash    HOME MEDICATIONS: Outpatient Medications Prior to Visit  Medication Sig Dispense Refill   Biotin 1000 MCG tablet Take 1,000 mcg by mouth daily.     CALCIUM CARBONATE PO Take 600 mg by mouth daily.      cetirizine (ZYRTEC) 10 MG tablet Take 10 mg by mouth every evening.      Cholecalciferol (VITAMIN D) 50 MCG (2000 UT) tablet Take 2,000 Units by mouth daily.     citalopram (CELEXA) 20 MG tablet TAKE 1 TABLET BY MOUTH EVERY DAY 90 tablet 2   ezetimibe (ZETIA) 10 MG tablet Take 1 tablet (10 mg total) by mouth daily. 90 tablet 3   hydroxypropyl methylcellulose / hypromellose (ISOPTO TEARS / GONIOVISC) 2.5 % ophthalmic solution Place 1 drop into both eyes 2 (two) times daily as needed for dry eyes.     levETIRAcetam (KEPPRA) 500 MG tablet Take 1 tablet by mouth twice daily 180 tablet 3   levothyroxine (SYNTHROID) 50 MCG tablet TAKE 1 TABLET BY MOUTH EVERY DAY 90 tablet 2   losartan (COZAAR) 25 MG tablet TAKE 2 TABLETS BY MOUTH EVERY DAY 180 tablet 1   Multiple Vitamins-Minerals (MULTIPLE VITAMINS/WOMENS PO) Take 1 tablet by mouth daily.      omeprazole (PRILOSEC) 20 MG capsule Take 1 capsule (20 mg total) by mouth daily. (Patient not taking: Reported on 10/30/2022) 60 capsule 1   polyethylene glycol (MIRALAX) 17 g packet Take 17 g by mouth daily. 14 each 0   pravastatin (PRAVACHOL) 80 MG tablet TAKE 1 TABLET BY MOUTH EVERY DAY 90 tablet 3  sucralfate (CARAFATE) 1 g tablet Take 1 tablet (1 g total) by mouth 4 (four) times daily -  with meals and at bedtime. (Patient not taking: Reported on 10/30/2022) 56 tablet 0   No facility-administered medications prior to visit.    PAST MEDICAL HISTORY: Past Medical History:  Diagnosis Date   Allergy    Anxiety    Constipation    Depression    Dysrhythmia    FLB - ? PAC'S COMES AND GOES   Gallbladder problem    GERD (gastroesophageal reflux disease)    High cholesterol    Hx of adenomatous polyp of colon     2012 - [facility]   Hyperlipidemia    Hypertension    Hypothyroid    Joint pain    no current problems   Osteoarthritis    PLMD (periodic limb movement disorder) 04/24/2012   Prediabetes    Seizures (HCC) 08/29/2013   only 1 seizure episode   Sleep apnea    wears CPAP nightly   Vitamin D deficiency     PAST SURGICAL HISTORY: Past Surgical History:  Procedure Laterality Date   APPENDECTOMY     CESAREAN SECTION      x1   CHOLECYSTECTOMY  1978   COLONOSCOPY     DILATION AND CURETTAGE OF UTERUS     JOINT REPLACEMENT  1998   right knee   OSTEOTOMY PROXIMAL FEMORAL     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   Procedure: TOTAL HIP ARTHROPLASTY ANTERIOR APPROACH;  Surgeon: Gean Birchwood, MD;  Location: [facility] OR;  Service: Orthopedics;  Laterality: Right;   TOTAL KNEE ARTHROPLASTY Left 07/20/2018   Procedure: Left Knee Arthroplasty;  Surgeon: Gean Birchwood, MD;  Location: [facility];  Service: Orthopedics;  Laterality: Left;    FAMILY HISTORY: Family History  Problem Relation Age of Onset   Heart disease Mother 6       Aortic valve disease   Diabetes Mother    Hypertension Mother    Thyroid disease Mother    Cancer Father 2   Esophageal cancer Father    Alcoholism Father    Sleep apnea Sister    Sleep apnea Sister    Atrial fibrillation Brother    Sleep apnea Brother    Stomach cancer Maternal Grandmother    Colon cancer Neg Hx    Rectal cancer Neg Hx    Colon polyps Neg Hx     SOCIAL HISTORY: Social History   Socioeconomic History   Marital status: Divorced    Spouse name: Not on file   Number of children: 1   Years of education: Not on file   Highest education level: Associate degree: occupational, Scientist, product/process development, or vocational program  Occupational History   Occupation: Retired    Associate Professor: [employer]  Tobacco Use   Smoking status: Never   Smokeless tobacco: Never  Vaping Use   Vaping status: Never Used  Substance and Sexual Activity   Alcohol use: No     Alcohol/week: 0.0 standard drinks of alcohol   Drug use: No   Sexual activity: Never    Birth control/protection: Post-menopausal  Other Topics Concern   Not on file  Social History Narrative   Lives with blind dog and cat.    Right handed    Social Determinants of Health   Financial Resource Strain: Low Risk  (06/21/2022)   Overall Financial Resource Strain (CARDIA)    Difficulty of Paying Living Expenses: Not hard at all  Food Insecurity: No Food  Insecurity (06/21/2022)   Hunger Vital Sign    Worried About Running Out of Food in the Last Year: Never true    Ran Out of Food in the Last Year: Never true  Transportation Needs: No Transportation Needs (06/21/2022)   PRAPARE - Administrator, Civil Service (Medical): No    Lack of Transportation (Non-Medical): No  Physical Activity: Unknown (06/21/2022)   Exercise Vital Sign    Days of Exercise per Week: 0 days    Minutes of Exercise per Session: Not on file  Recent Concern: Physical Activity - Inactive (06/21/2022)   Exercise Vital Sign    Days of Exercise per Week: 0 days    Minutes of Exercise per Session: 0 min  Stress: No Stress Concern Present (06/21/2022)   [employer] of Occupational Health - Occupational Stress Questionnaire    Feeling of Stress : Only a little  Social Connections: Moderately Integrated (06/21/2022)   Social Connection and Isolation Panel [NHANES]    Frequency of Communication with Friends and Family: More than three times a week    Frequency of Social Gatherings with Friends and Family: Once a week    Attends Religious Services: More than 4 times per year    Active Member of [employer] or Organizations: Yes    Attends Banker Meetings: More than 4 times per year    Marital Status: Divorced  Intimate Partner Violence: Not At Risk (11/07/2021)   Humiliation, Afraid, Rape, and Kick questionnaire    Fear of Current or Ex-Partner: No    Emotionally Abused: No    Physically Abused: No     Sexually Abused: No      PHYSICAL EXAM Generalized: Well developed, in no acute distress   Neurological examination  Mentation: Alert oriented to time, place, history taking. Follows all commands speech and language fluent Cranial nerve II-XII: facial symmetry noted  DIAGNOSTIC DATA (LABS, IMAGING, TESTING) - I reviewed patient records, labs, notes, testing and imaging myself where available.  Lab Results  Component Value Date   WBC 5.8 10/14/2022   HGB 12.9 10/14/2022   HCT 38.4 10/14/2022   MCV 91.1 10/14/2022   PLT 316.0 10/14/2022      Component Value Date/Time   NA 134 (L) 10/14/2022 1422   NA 138 06/12/2022 1021   K 4.3 10/14/2022 1422   CL 98 10/14/2022 1422   CO2 29 10/14/2022 1422   GLUCOSE 77 10/14/2022 1422   BUN 22 10/14/2022 1422   BUN 17 06/12/2022 1021   CREATININE 0.64 10/14/2022 1422   CREATININE 0.72 09/02/2015 1207   CALCIUM 9.7 10/14/2022 1422   PROT 6.8 10/14/2022 1422   PROT 7.1 06/12/2022 1021   ALBUMIN 4.4 10/14/2022 1422   ALBUMIN 4.6 06/12/2022 1021   AST 20 10/14/2022 1422   ALT 20 10/14/2022 1422   ALKPHOS 73 10/14/2022 1422   BILITOT 0.3 10/14/2022 1422   BILITOT 0.6 06/12/2022 1021   GFRNONAA 84 01/17/2020 1106   GFRNONAA 89 09/02/2015 1207   GFRAA 96 01/17/2020 1106   GFRAA >89 09/02/2015 1207   Lab Results  Component Value Date   CHOL 237 (H) 06/12/2022   HDL 56 06/12/2022   LDLCALC 144 (H) 06/12/2022   LDLDIRECT 96.0 05/04/2018   TRIG 208 (H) 06/12/2022   CHOLHDL 3.8 12/25/2020   Lab Results  Component Value Date   HGBA1C 5.5 06/12/2022   Lab Results  Component Value Date   VITAMINB12 991 06/12/2022   Lab Results  Component Value Date   TSH 2.570 06/12/2022      ASSESSMENT AND PLAN 72 y.o. year old female  has a past medical history of Allergy, Anxiety, Constipation, Depression, Dysrhythmia, Gallbladder problem, GERD (gastroesophageal reflux disease), High cholesterol, adenomatous polyp of colon,  Hyperlipidemia, Hypertension, Hypothyroid, Joint pain, Osteoarthritis, PLMD (periodic limb movement disorder) (04/24/2012), Prediabetes, Seizures (HCC) (08/29/2013), Sleep apnea, and Vitamin D deficiency. here with:  OSA on CPAP  CPAP compliance excellent Residual AHI is good Encouraged patient to continue using CPAP nightly and > 4 hours each night F/U in 1 year or sooner if needed   Butch Penny, MSN, NP-C 11/26/2022, 3:01 PM [facility] [address] [place], [place] [address] [phone]

## 2022-11-28 ENCOUNTER — Encounter (INDEPENDENT_AMBULATORY_CARE_PROVIDER_SITE_OTHER): Payer: Self-pay | Admitting: Family Medicine

## 2022-11-28 ENCOUNTER — Ambulatory Visit (INDEPENDENT_AMBULATORY_CARE_PROVIDER_SITE_OTHER): Payer: Medicare Other | Admitting: Family Medicine

## 2022-11-28 VITALS — BP 134/62 | HR 68 | Temp 98.1°F | Ht 63.0 in | Wt 185.0 lb

## 2022-11-28 DIAGNOSIS — Z6832 Body mass index (BMI) 32.0-32.9, adult: Secondary | ICD-10-CM

## 2022-11-28 DIAGNOSIS — R109 Unspecified abdominal pain: Secondary | ICD-10-CM | POA: Diagnosis not present

## 2022-11-28 DIAGNOSIS — F5089 Other specified eating disorder: Secondary | ICD-10-CM | POA: Diagnosis not present

## 2022-11-28 DIAGNOSIS — K5909 Other constipation: Secondary | ICD-10-CM

## 2022-11-28 DIAGNOSIS — F418 Other specified anxiety disorders: Secondary | ICD-10-CM

## 2022-11-28 DIAGNOSIS — E669 Obesity, unspecified: Secondary | ICD-10-CM

## 2022-11-28 DIAGNOSIS — K5900 Constipation, unspecified: Secondary | ICD-10-CM | POA: Diagnosis not present

## 2022-11-28 NOTE — Progress Notes (Signed)
.smr  Office: [phone]  /  Fax: [phone]  WEIGHT SUMMARY AND BIOMETRICS  Anthropometric Measurements Height: 5\' 3"  (1.6 m) Weight: 185 lb (83.9 kg) BMI (Calculated): 32.78 Weight at Last Visit: 184 lb Weight Lost Since Last Visit: 0 Weight Gained Since Last Visit: 1 lb Starting Weight: 208 lb Total Weight Loss (lbs): 23 lb (10.4 kg) Peak Weight: 250 lb   Body Composition  Body Fat %: 43.9 % Fat Mass (lbs): 81.2 lbs Muscle Mass (lbs): 98.6 lbs Total Body Water (lbs): 76.6 lbs Visceral Fat Rating : 13   Other Clinical Data Fasting: Yes Labs: No Today's Visit #: 30 Starting Date: 08/25/18    Chief Complaint: OBESITY  History of Present Illness   The patient, with a history of obesity and depression, presents for a follow-up visit. She reports a weight gain of one pound over the past month despite adherence to a category one eating plan approximately 75% of the time. She has incorporated swimming into her exercise regimen, engaging in 60-minute sessions twice weekly. The patient is also on a daily dose of 20mg  Celexa for depression and emotional eating behaviors.  The patient has been experiencing abdominal pain, which she attributes to a slow gut. She reports that the pain has lessened in frequency and intensity after starting a regimen of Miralax. The patient also notes a tendency towards non-hunger eating, particularly of carbohydrate-rich foods, which she is working to address by keeping herself occupied with activities she enjoys.  In addition to these health concerns, the patient has been dealing with personal stressors, including the temporary loss of her blind and deaf dog, which was found in a storm drain after a 24-hour search. She also mentions plans for upcoming travel and the potential impact on her eating and exercise routines.          PHYSICAL EXAM:  Blood pressure 134/62, pulse 68, temperature 98.1 F (36.7 C), height 5\' 3"  (1.6 m), weight 185 lb  (83.9 kg), SpO2 92%. Body mass index is 32.77 kg/m.  DIAGNOSTIC DATA REVIEWED:  BMET    Component Value Date/Time   NA 134 (L) 10/14/2022 1422   NA 138 06/12/2022 1021   K 4.3 10/14/2022 1422   CL 98 10/14/2022 1422   CO2 29 10/14/2022 1422   GLUCOSE 77 10/14/2022 1422   BUN 22 10/14/2022 1422   BUN 17 06/12/2022 1021   CREATININE 0.64 10/14/2022 1422   CREATININE 0.72 09/02/2015 1207   CALCIUM 9.7 10/14/2022 1422   GFRNONAA 84 01/17/2020 1106   GFRNONAA 89 09/02/2015 1207   GFRAA 96 01/17/2020 1106   GFRAA >89 09/02/2015 1207   Lab Results  Component Value Date   HGBA1C 5.5 06/12/2022   HGBA1C 5.9 03/13/2016   Lab Results  Component Value Date   INSULIN 8.0 06/12/2022   INSULIN 14.7 08/25/2018   Lab Results  Component Value Date   TSH 2.570 06/12/2022   CBC    Component Value Date/Time   WBC 5.8 10/14/2022 1422   RBC 4.21 10/14/2022 1422   HGB 12.9 10/14/2022 1422   HGB 13.6 06/12/2022 1021   HCT 38.4 10/14/2022 1422   HCT 40.5 06/12/2022 1021   PLT 316.0 10/14/2022 1422   PLT 314 06/12/2022 1021   MCV 91.1 10/14/2022 1422   MCV 90 06/12/2022 1021   MCH 30.4 06/12/2022 1021   MCH 31.4 07/21/2018 0304   MCHC 33.7 10/14/2022 1422   RDW 12.8 10/14/2022 1422   RDW 11.9 06/12/2022 1021  Iron Studies    Component Value Date/Time   IRON 110 11/01/2019 0853   TIBC 382 11/01/2019 0853   IRONPCTSAT 29 11/01/2019 0853   Lipid Panel     Component Value Date/Time   CHOL 237 (H) 06/12/2022 1021   TRIG 208 (H) 06/12/2022 1021   HDL 56 06/12/2022 1021   CHOLHDL 3.8 12/25/2020 1230   CHOLHDL 5 05/04/2018 0805   VLDL 78.4 (H) 05/04/2018 0805   LDLCALC 144 (H) 06/12/2022 1021   LDLDIRECT 96.0 05/04/2018 0805   Hepatic Function Panel     Component Value Date/Time   PROT 6.8 10/14/2022 1422   PROT 7.1 06/12/2022 1021   ALBUMIN 4.4 10/14/2022 1422   ALBUMIN 4.6 06/12/2022 1021   AST 20 10/14/2022 1422   ALT 20 10/14/2022 1422   ALKPHOS 73 10/14/2022  1422   BILITOT 0.3 10/14/2022 1422   BILITOT 0.6 06/12/2022 1021      Component Value Date/Time   TSH 2.570 06/12/2022 1021   Nutritional Lab Results  Component Value Date   VD25OH 40.8 06/12/2022   VD25OH 45.9 12/17/2021   VD25OH 56.0 06/06/2021     Assessment and Plan    Obesity Recent weight gain of one pound over the past month. Following category one eating plan 75% of the time and swimming 60 minutes twice a week. Discussed importance of routine and non-food-related activities for emotional eating. Weight gain possibly due to water retention from high-sodium foods at social events. - Continue category one eating plan - Continue swimming for exercise - Encourage routine and non-food-related activities for emotional eating - Follow-up in January  Depression with Emotional Eating Behaviors Managed with Celexa 20 mg daily. Emotional eating behaviors noted. Discussed neurotransmitter boosts from food and alternative activities for emotional eating. - Continue Celexa 20 mg daily - Encourage enjoyable activities to help decrease emotional eating  Abdominal Pain from Constipation Intermittent pain due to constipation managed with MiraLAX. Significant improvement with rare occurrences. Discussed safety and ease of use of MiraLAX, noting overly soft stools as a side effect. - Continue MiraLAX as needed  General Health Maintenance Routine health maintenance discussed. Labs to be done in December. - Order labs in December - Follow-up in December.       She was informed of the importance of frequent follow up visits to maximize her success with intensive lifestyle modifications for her multiple health conditions.    Quillian Quince, MD

## 2022-12-25 ENCOUNTER — Encounter (INDEPENDENT_AMBULATORY_CARE_PROVIDER_SITE_OTHER): Payer: Self-pay | Admitting: Family Medicine

## 2022-12-25 ENCOUNTER — Ambulatory Visit (INDEPENDENT_AMBULATORY_CARE_PROVIDER_SITE_OTHER): Payer: Medicare Other | Admitting: Family Medicine

## 2022-12-25 VITALS — BP 117/64 | HR 70 | Temp 98.4°F | Ht 63.0 in | Wt 183.0 lb

## 2022-12-25 DIAGNOSIS — E785 Hyperlipidemia, unspecified: Secondary | ICD-10-CM | POA: Diagnosis not present

## 2022-12-25 DIAGNOSIS — K5900 Constipation, unspecified: Secondary | ICD-10-CM

## 2022-12-25 DIAGNOSIS — E669 Obesity, unspecified: Secondary | ICD-10-CM | POA: Diagnosis not present

## 2022-12-25 DIAGNOSIS — Z6832 Body mass index (BMI) 32.0-32.9, adult: Secondary | ICD-10-CM | POA: Diagnosis not present

## 2022-12-25 DIAGNOSIS — E559 Vitamin D deficiency, unspecified: Secondary | ICD-10-CM | POA: Diagnosis not present

## 2022-12-25 DIAGNOSIS — R739 Hyperglycemia, unspecified: Secondary | ICD-10-CM

## 2022-12-25 DIAGNOSIS — E782 Mixed hyperlipidemia: Secondary | ICD-10-CM

## 2022-12-25 DIAGNOSIS — K5909 Other constipation: Secondary | ICD-10-CM

## 2022-12-25 DIAGNOSIS — E039 Hypothyroidism, unspecified: Secondary | ICD-10-CM

## 2022-12-25 DIAGNOSIS — I10 Essential (primary) hypertension: Secondary | ICD-10-CM | POA: Diagnosis not present

## 2022-12-25 MED ORDER — POLYETHYLENE GLYCOL 3350 17 G PO PACK
17.0000 g | PACK | Freq: Every day | ORAL | 0 refills | Status: DC
Start: 2022-12-25 — End: 2023-04-30

## 2022-12-25 NOTE — Progress Notes (Signed)
.smr  Office: [phone]  /  Fax: [phone]  WEIGHT SUMMARY AND BIOMETRICS  Anthropometric Measurements Height: 5\' 3"  (1.6 m) Weight: 183 lb (83 kg) BMI (Calculated): 32.43 Weight at Last Visit: 185 lb Weight Lost Since Last Visit: 2 lb Weight Gained Since Last Visit: 0 Starting Weight: 208 lb Total Weight Loss (lbs): 25 lb (11.3 kg) Peak Weight: 250 lb   Body Composition  Body Fat %: 44.3 % Fat Mass (lbs): 81.4 lbs Muscle Mass (lbs): 97 lbs Total Body Water (lbs): 77.2 lbs Visceral Fat Rating : 13   Other Clinical Data Fasting: yes Labs: yes Today's Visit #: 31 Starting Date: 08/25/18    Chief Complaint: OBESITY   History of Present Illness   The patient presents today for a follow-up consultation regarding her ongoing management of constipation, hypertension, and obesity. She reports being on Miralax for constipation and Losartan for hypertension, with blood pressure well-controlled at 117/64. The patient is actively working on diet, exercise, and overall weight loss to manage both hypertension and obesity. She reports a weight loss of two pounds in the last month, despite the holiday season. She admits to adhering to her category one eating plan about 50% of the time and currently not engaging in any exercise regimen.  The patient also reports persistent abdominal pain, despite being on Miralax. She expresses concern about this ongoing discomfort and is looking forward to discussing it with her gastroenterologist in the upcoming appointment. She has been adhering to a diet that includes vegetables like broccoli and peas, but has noticed some irregularities in her eating schedule due to a busy lifestyle.  The patient also mentions a recent trip overseas, which involved a significant amount of walking and physical activity. Despite the physical exertion, she did not report any exacerbation of her hypertension or any other adverse health effects during the trip. She  expresses a desire to increase her physical activity and lose 20 pounds in the upcoming year. She also mentions a desire to improve her balance and joint health, possibly through a renewed commitment to a home exercise program.  The patient's medication regimen appears to be well-tolerated, with no reported side effects or issues with adherence. She is also on a generic form of Vitamin D and has recently added Zetia to her Pravachol for cholesterol management. She is due for blood work today to monitor her glucose, insulin, Vitamin D, B12, thyroid, and cholesterol levels. The patient does not request any medication refills at this time.          PHYSICAL EXAM:  Blood pressure 117/64, pulse 70, temperature 98.4 F (36.9 C), height 5\' 3"  (1.6 m), weight 183 lb (83 kg), SpO2 93%. Body mass index is 32.42 kg/m.  DIAGNOSTIC DATA REVIEWED:  BMET    Component Value Date/Time   NA 134 (L) 10/14/2022 1422   NA 138 06/12/2022 1021   K 4.3 10/14/2022 1422   CL 98 10/14/2022 1422   CO2 29 10/14/2022 1422   GLUCOSE 77 10/14/2022 1422   BUN 22 10/14/2022 1422   BUN 17 06/12/2022 1021   CREATININE 0.64 10/14/2022 1422   CREATININE 0.72 09/02/2015 1207   CALCIUM 9.7 10/14/2022 1422   GFRNONAA 84 01/17/2020 1106   GFRNONAA 89 09/02/2015 1207   GFRAA 96 01/17/2020 1106   GFRAA >89 09/02/2015 1207   Lab Results  Component Value Date   HGBA1C 5.5 06/12/2022   HGBA1C 5.9 03/13/2016   Lab Results  Component Value Date   INSULIN  8.0 06/12/2022   INSULIN 14.7 08/25/2018   Lab Results  Component Value Date   TSH 2.570 06/12/2022   CBC    Component Value Date/Time   WBC 5.8 10/14/2022 1422   RBC 4.21 10/14/2022 1422   HGB 12.9 10/14/2022 1422   HGB 13.6 06/12/2022 1021   HCT 38.4 10/14/2022 1422   HCT 40.5 06/12/2022 1021   PLT 316.0 10/14/2022 1422   PLT 314 06/12/2022 1021   MCV 91.1 10/14/2022 1422   MCV 90 06/12/2022 1021   MCH 30.4 06/12/2022 1021   MCH 31.4 07/21/2018 0304    MCHC 33.7 10/14/2022 1422   RDW 12.8 10/14/2022 1422   RDW 11.9 06/12/2022 1021   Iron Studies    Component Value Date/Time   IRON 110 11/01/2019 0853   TIBC 382 11/01/2019 0853   IRONPCTSAT 29 11/01/2019 0853   Lipid Panel     Component Value Date/Time   CHOL 237 (H) 06/12/2022 1021   TRIG 208 (H) 06/12/2022 1021   HDL 56 06/12/2022 1021   CHOLHDL 3.8 12/25/2020 1230   CHOLHDL 5 05/04/2018 0805   VLDL 78.4 (H) 05/04/2018 0805   LDLCALC 144 (H) 06/12/2022 1021   LDLDIRECT 96.0 05/04/2018 0805   Hepatic Function Panel     Component Value Date/Time   PROT 6.8 10/14/2022 1422   PROT 7.1 06/12/2022 1021   ALBUMIN 4.4 10/14/2022 1422   ALBUMIN 4.6 06/12/2022 1021   AST 20 10/14/2022 1422   ALT 20 10/14/2022 1422   ALKPHOS 73 10/14/2022 1422   BILITOT 0.3 10/14/2022 1422   BILITOT 0.6 06/12/2022 1021      Component Value Date/Time   TSH 2.570 06/12/2022 1021   Nutritional Lab Results  Component Value Date   VD25OH 40.8 06/12/2022   VD25OH 45.9 12/17/2021   VD25OH 56.0 06/06/2021     Assessment and Plan    Constipation Chronic constipation managed with Miralax (Purelax). Reports continued abdominal pain, possibly related to previous colonoscopy findings. Differential includes irritation from colonoscopy or diverticula. Discussed potential need to adjust fiber intake based on further evaluation. - Refill Miralax - Continue with half a capful of Purelax daily - Follow-up with Dr. Allena Katz on January 8th for further evaluation  Hypertension Hypertension well-controlled with losartan. Current blood pressure is 117/64 mmHg. No symptoms of hypotension reported. Discussed importance of monitoring for any symptoms of hypotension. - Continue losartan as prescribed - Monitor blood pressure regularly  Obesity Working on diet and exercise for weight loss. Lost 2 pounds in the last month. Following a category one eating plan 50% of the time. Plans to start [facility]  exercise program. Discussed benefits of weight loss for joint health, muscle strength, and balance. - Encourage continuation of diet plan - Initiate exercise regimen with [employer] program - Monitor weight and progress  Hypothyroid, HLD and vit D deficiency Routine blood work planned to monitor glucose, insulin, vitamin D, B12, and cholesterol levels. Fasting for blood work today. - Order blood work for glucose, insulin, vitamin D, B12, TSH and cholesterol - Monitor results and adjust treatment as necessary  Follow-up - Follow-up with Dr. Allena Katz on January 8th - Follow-up with Dr. Karel Jarvis on January 8th.        She was informed of the importance of frequent follow up visits to maximize her success with intensive lifestyle modifications for her multiple health conditions.    Quillian Quince, MD

## 2022-12-26 LAB — INSULIN, RANDOM: INSULIN: 9.1 u[IU]/mL (ref 2.6–24.9)

## 2022-12-26 LAB — HEMOGLOBIN A1C
Est. average glucose Bld gHb Est-mCnc: 114 mg/dL
Hgb A1c MFr Bld: 5.6 % (ref 4.8–5.6)

## 2022-12-26 LAB — CMP14+EGFR
ALT: 20 [IU]/L (ref 0–32)
AST: 20 [IU]/L (ref 0–40)
Albumin: 4.7 g/dL (ref 3.8–4.8)
Alkaline Phosphatase: 100 [IU]/L (ref 44–121)
BUN/Creatinine Ratio: 29 — ABNORMAL HIGH (ref 12–28)
BUN: 20 mg/dL (ref 8–27)
Bilirubin Total: 0.5 mg/dL (ref 0.0–1.2)
CO2: 20 mmol/L (ref 20–29)
Calcium: 10 mg/dL (ref 8.7–10.3)
Chloride: 98 mmol/L (ref 96–106)
Creatinine, Ser: 0.7 mg/dL (ref 0.57–1.00)
Globulin, Total: 2.7 g/dL (ref 1.5–4.5)
Glucose: 80 mg/dL (ref 70–99)
Potassium: 4.4 mmol/L (ref 3.5–5.2)
Sodium: 137 mmol/L (ref 134–144)
Total Protein: 7.4 g/dL (ref 6.0–8.5)
eGFR: 92 mL/min/{1.73_m2} (ref >59)

## 2022-12-26 LAB — LIPID PANEL WITH LDL/HDL RATIO
Cholesterol, Total: 169 mg/dL (ref 100–199)
HDL: 57 mg/dL (ref >39)
LDL Chol Calc (NIH): 88 mg/dL (ref 0–99)
LDL/HDL Ratio: 1.5 {ratio} (ref 0.0–3.2)
Triglycerides: 139 mg/dL (ref 0–149)
VLDL Cholesterol Cal: 24 mg/dL (ref 5–40)

## 2022-12-26 LAB — VITAMIN D 25 HYDROXY (VIT D DEFICIENCY, FRACTURES): Vit D, 25-Hydroxy: 39.5 ng/mL (ref 30.0–100.0)

## 2022-12-26 LAB — TSH: TSH: 2.41 u[IU]/mL (ref 0.450–4.500)

## 2023-01-15 ENCOUNTER — Encounter: Payer: Self-pay | Admitting: Neurology

## 2023-01-15 ENCOUNTER — Ambulatory Visit: Payer: Medicare Other | Admitting: Internal Medicine

## 2023-01-15 ENCOUNTER — Ambulatory Visit (INDEPENDENT_AMBULATORY_CARE_PROVIDER_SITE_OTHER): Payer: Medicare Other | Admitting: Neurology

## 2023-01-15 ENCOUNTER — Encounter: Payer: Self-pay | Admitting: Internal Medicine

## 2023-01-15 VITALS — BP 120/70 | HR 72 | Ht 62.25 in | Wt 191.2 lb

## 2023-01-15 VITALS — BP 121/55 | HR 72 | Ht 62.0 in | Wt 192.2 lb

## 2023-01-15 DIAGNOSIS — G40009 Localization-related (focal) (partial) idiopathic epilepsy and epileptic syndromes with seizures of localized onset, not intractable, without status epilepticus: Secondary | ICD-10-CM | POA: Diagnosis not present

## 2023-01-15 DIAGNOSIS — R1012 Left upper quadrant pain: Secondary | ICD-10-CM

## 2023-01-15 DIAGNOSIS — K5904 Chronic idiopathic constipation: Secondary | ICD-10-CM | POA: Diagnosis not present

## 2023-01-15 MED ORDER — LEVETIRACETAM 500 MG PO TABS: 500.0000 mg | ORAL_TABLET | Freq: Two times a day (BID) | ORAL | 4 refills | Status: DC
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
## 2023-01-15 NOTE — Progress Notes (Signed)
 Chief Complaint: LUQ pain Primary GI Doctor: Dr. Avram  HPI:  73 year old female patient with medical history of constipation, hypothyroidism, hypertension, and obesity who presents with main complaint of LUQ pain.  On 09/30/2022 Patient presented with complaints of LUQ abdominal pain. At that time it was thought to be possibly related to GERD therefore she was prescribed Prilosec twice daily and started on Carafate . Avoid NSAID's. Lower dose of Citalopram .  On 12/25/2022 patient seen by PCP and c/o persistent abd pain despite being on Miralax . She has been adhering to a diet that includes vegetables like broccoli and peas, but has noticed some irregularities in her eating schedule due to a busy lifestyle.   Interval History    Her main complaint is of LUQ abdominal that she describes it as a achy and cramping pain that will occur few times a month. Hurts more when sitting long periods of time, she does secretarial work. She reports she had bout of GERD during holidays due to eating certain things such as chocolate, however since then has been on a strict diet with high protein high fiber diet. Her GERD is controlled with dietary changes only. She was prescribed Prilosec 20mg  and Carafate  and took for a few weeks without improvement, so she discontinued. No dysphagia. Pain with or without eating.  Patient reports she has dry stools that is hard to pass. She drinks about 64 oz of water  per day along with Diet coke. She reports she has one bowel movement per day but reports it is small amount. She is currently taking OTC Miralax  1/2 cap full, not admits she has not be taking daily. When she did take it her stools were softer. She did confirm when she has more bowel movements the pain seems to improve. Denies alcohol  or tobacco use. Denies/Admits NSAID use. Patient's family history includes father had lung CA, paternal grandmother had stomach CA, maternal grandmother had kidney CA.  Past Medical  History:  Diagnosis Date   Allergy    Anxiety    Constipation    Depression    Dysrhythmia    FLB - ? PAC'S COMES AND GOES   Gallbladder problem    GERD (gastroesophageal reflux disease)    High cholesterol    Hx of adenomatous polyp of colon    2012 - [facility]   Hyperlipidemia    Hypertension    Hypothyroid    Joint pain    no current problems   Osteoarthritis    PLMD (periodic limb movement disorder) 04/24/2012   Prediabetes    Seizures (HCC) 08/29/2013   only 1 seizure episode   Sleep apnea    wears CPAP nightly   Vitamin D  deficiency     Past Surgical History:  Procedure Laterality Date   APPENDECTOMY     CESAREAN SECTION      x1   CHOLECYSTECTOMY  1978   COLONOSCOPY     DILATION AND CURETTAGE OF UTERUS     JOINT REPLACEMENT  1998   right knee   OSTEOTOMY PROXIMAL FEMORAL     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   Procedure: TOTAL HIP ARTHROPLASTY ANTERIOR APPROACH;  Surgeon: Dempsey Sensor, MD;  Location: [facility] OR;  Service: Orthopedics;  Laterality: Right;   TOTAL KNEE ARTHROPLASTY Left 07/20/2018   Procedure: Left Knee Arthroplasty;  Surgeon: Sensor Dempsey, MD;  Location: [facility];  Service: Orthopedics;  Laterality: Left;    Current Outpatient Medications  Medication Sig Dispense Refill   Biotin  1000  MCG tablet Take 1,000 mcg by mouth daily.     CALCIUM  CARBONATE PO Take 600 mg by mouth daily.      cetirizine (ZYRTEC) 10 MG tablet Take 10 mg by mouth every evening.      Cholecalciferol  (VITAMIN D ) 50 MCG (2000 UT) tablet Take 2,000 Units by mouth daily.     citalopram  (CELEXA ) 20 MG tablet TAKE 1 TABLET BY MOUTH EVERY DAY 90 tablet 2   ezetimibe  (ZETIA ) 10 MG tablet Take 1 tablet (10 mg total) by mouth daily. 90 tablet 3   hydroxypropyl methylcellulose / hypromellose (ISOPTO TEARS / GONIOVISC) 2.5 % ophthalmic solution Place 1 drop into both eyes 2 (two) times daily as needed for dry eyes.     levETIRAcetam  (KEPPRA ) 500 MG tablet Take 1 tablet by mouth twice daily  180 tablet 3   levothyroxine  (SYNTHROID ) 50 MCG tablet TAKE 1 TABLET BY MOUTH EVERY DAY 90 tablet 2   losartan  (COZAAR ) 25 MG tablet TAKE 2 TABLETS BY MOUTH EVERY DAY 180 tablet 1   Multiple Vitamins-Minerals (MULTIPLE VITAMINS/WOMENS PO) Take 1 tablet by mouth daily.      polyethylene glycol (MIRALAX ) 17 g packet Take 17 g by mouth daily. 14 each 0   pravastatin  (PRAVACHOL ) 80 MG tablet TAKE 1 TABLET BY MOUTH EVERY DAY 90 tablet 3   omeprazole  (PRILOSEC) 20 MG capsule Take 1 capsule (20 mg total) by mouth daily. (Patient not taking: Reported on 01/15/2023) 60 capsule 1   sucralfate  (CARAFATE ) 1 g tablet Take 1 tablet (1 g total) by mouth 4 (four) times daily -  with meals and at bedtime. (Patient not taking: Reported on 01/15/2023) 56 tablet 0   No current facility-administered medications for this visit.    Allergies as of 01/15/2023 - Review Complete 01/15/2023  Allergen Reaction Noted   Crestor [rosuvastatin] Other (See Comments) 08/29/2013   Neomycin-bacitracin zn-polymyx Swelling    Neomycin-bacitracin-polymyxin [bacitracin-neomycin-polymyxin] Swelling 08/29/2013   Codeine Rash 08/29/2013   Feldene [piroxicam] Rash 08/29/2013   Penicillins Rash 08/29/2013   Piroxicam Rash    Sulfa antibiotics Rash 08/29/2013   Sulfonamide derivatives Rash     Family History  Problem Relation Age of Onset   Heart disease Mother 72       Aortic valve disease   Diabetes Mother    Hypertension Mother    Thyroid  disease Mother    Cancer Father 15   Esophageal cancer Father    Alcoholism Father    Sleep apnea Sister    Sleep apnea Sister    Atrial fibrillation Brother    Sleep apnea Brother    Stomach cancer Maternal Grandmother    Colon cancer Neg Hx    Rectal cancer Neg Hx    Colon polyps Neg Hx     Review of Systems:    Constitutional: No weight loss, fever, chills, weakness or fatigue HEENT: Eyes: No change in vision               Ears, Nose, Throat:  No change in hearing or  congestion Skin: No rash or itching Cardiovascular: No chest pain, chest pressure or palpitations   Respiratory: No SOB or cough Gastrointestinal: See HPI and otherwise negative Genitourinary: No dysuria or change in urinary frequency Neurological: No headache, dizziness or syncope Musculoskeletal: No new muscle or joint pain Hematologic: No bleeding or bruising Psychiatric: No history of depression or anxiety   Physical Exam:  Vital signs: BP 120/70 (BP Location: Left Arm, Patient Position: Sitting, Cuff  Size: Large)   Pulse 72   Ht 5' 2.25 (1.581 m) Comment: height measured without shoes  Wt 191 lb 4 oz (86.8 kg)   LMP  (LMP Unknown)   BMI 34.70 kg/m   Constitutional:   Pleasant Caucasian female  appears to be in NAD, Well developed, Well nourished, alert and cooperative Neck:  Supple Throat: Oral cavity and pharynx without inflammation, swelling or lesion.  Respiratory: Respirations even and unlabored. Lungs clear to auscultation bilaterally.   No wheezes, crackles, or rhonchi.  Cardiovascular: Normal S1, S2. Regular rate and rhythm. No peripheral edema, cyanosis or pallor.  Gastrointestinal:  Soft, nondistended, nontender. No rebound or guarding. Normal bowel sounds. No appreciable masses or hepatomegaly. Rectal:  Not performed.  Skin:   Dry and intact without significant lesions or rashes. Psychiatric: Oriented to person, place and time. Demonstrates good judgement and reason without abnormal affect or behaviors.  RELEVANT LABS AND IMAGING: CBC    Latest Ref Rng & Units 10/14/2022    2:22 PM 09/23/2022    3:55 PM 06/12/2022   10:21 AM  CBC  WBC 4.0 - 10.5 K/uL 5.8  5.9  4.7   Hemoglobin 12.0 - 15.0 g/dL 87.0  86.5  86.3   Hematocrit 36.0 - 46.0 % 38.4  40.4  40.5   Platelets 150.0 - 400.0 K/uL 316.0  344.0  314     CMP     Latest Ref Rng & Units 12/25/2022   10:18 AM 10/14/2022    2:22 PM 09/30/2022    2:49 PM  CMP  Glucose 70 - 99 mg/dL 80  77  72   BUN 8 - 27  mg/dL 20  22  13    Creatinine 0.57 - 1.00 mg/dL 9.29  9.35  9.36   Sodium 134 - 144 mmol/L 137  134  135   Potassium 3.5 - 5.2 mmol/L 4.4  4.3  4.4   Chloride 96 - 106 mmol/L 98  98  99   CO2 20 - 29 mmol/L 20  29  29    Calcium  8.7 - 10.3 mg/dL 89.9  9.7  9.3   Total Protein 6.0 - 8.5 g/dL 7.4  6.8    Total Bilirubin 0.0 - 1.2 mg/dL 0.5  0.3    Alkaline Phos 44 - 121 IU/L 100  73    AST 0 - 40 IU/L 20  20    ALT 0 - 32 IU/L 20  20      Lab Results  Component Value Date   TSH 2.410 12/25/2022    Lab Results  Component Value Date   LIPASE 27.0 10/14/2022    10/16/2022 CT Abd/pelvis w/ contrast IMPRESSION: No acute findings within the abdomen or pelvis. Stable small uterine fibroids. Large stool burden noted; recommend clinical correlation for possible constipation.   06/17/2022 colonoscopy  Impression:  - Two diminutive polyps in the sigmoid colon and in the transverse colon, removed with a cold snare. Resected and retrieved.  - The examination was otherwise normal on direct and retroflexion views.  - Personal history of colonic polyps. 2004 single adenoma 2008 3 adenomas 2012  - small adenoma  - sigmoid 2013  - hyperplastic polyp 3/ 22/ 2016 incomplete prep 5/ 2/ 2016 no polyps 4/ 22/ 2021 5 diminutive polyps adenomas Path:  Diagnosis Surgical [P], colon, sigmoid and transverse, polyp (2) TUBULAR ADENOMA. HYPERPLASTIC POLYP. NEGATIVE FOR HIGH-GRADE DYSPLASIA.  Assessment: Encounter Diagnoses  Name Primary?   Abdominal pain, left upper quadrant Yes  Chronic idiopathic constipation     73 year old female patient with intermittent LUQ pain that has not improved with PPI therapy BID and Carafate . It is reassuring patient's lab work and imaging (CT scan abd/pelvis) did not show any abnormal findings. Normal lipase. Normal TSH. Normal LFT's. Colonoscopy 6/24. Patient does have history of CIC and admits when her bowels move more regularly the pain seems to improve. The daily  Miralax  softens her stools and works well so reinforced the importance of taking it daily along with high fiber diet and increasing her overall physical activity. Avoid sitting long periods of time at work. She works at [employer] she has access to walking track.   Plan: -Reinforce Miralax  po daily -Reinforce high fiber low fat diet -Recommend physical activity as tolerated. -Follow-up as needed  Jennifer Justen, FNP-C [facility] 01/15/2023, 9:16 AM  Cc: Levora Reyes SAUNDERS, MD

## 2023-01-15 NOTE — Progress Notes (Signed)
 I have also seen this patient today.  I agree with Ms. Mays evaluation.  Suspect left upper quadrant pain related to constipation.  Treat with MiraLAX.  Follow-up as needed.

## 2023-01-15 NOTE — Patient Instructions (Signed)
 Take a dose of Miralax  daily.   Follow a high fiber diet.   Exercise as tolerated.  _______________________________________________________  If your blood pressure at your visit was 140/90 or greater, please contact your primary care physician to follow up on this.  _______________________________________________________  If you are age 72 or older, your body mass index should be between 23-30. Your Body mass index is 34.7 kg/m. If this is out of the aforementioned range listed, please consider follow up with your Primary Care Provider.  If you are age 68 or younger, your body mass index should be between 19-25. Your Body mass index is 34.7 kg/m. If this is out of the aformentioned range listed, please consider follow up with your Primary Care Provider.   ________________________________________________________  [facility] providers would like to encourage you to use MYCHART to communicate with providers for non-urgent requests or questions.  Due to long hold times on the telephone, sending your provider a message by [facility] may be a faster and more efficient way to get a response.  Please allow 48 business hours for a response.  Please remember that this is for non-urgent requests.  _______________________________________________________  I appreciate the opportunity to care for you. Deanna May, NP-C Lupita Commander, MD, [facility]

## 2023-01-21 ENCOUNTER — Ambulatory Visit (INDEPENDENT_AMBULATORY_CARE_PROVIDER_SITE_OTHER): Payer: Medicare Other | Admitting: *Deleted

## 2023-01-21 DIAGNOSIS — Z0000 Encounter for general adult medical examination without abnormal findings: Secondary | ICD-10-CM | POA: Diagnosis not present

## 2023-01-21 NOTE — Patient Instructions (Signed)
 Jennifer [name] , Thank you for taking time to come for your Medicare Wellness Visit. I appreciate your ongoing commitment to your health goals. Please review the following plan we discussed and let me know if I can assist you in the future.   Screening recommendations/referrals: Colonoscopy: up to date Mammogram: up to date Bone Density: up to date Recommended yearly ophthalmology/optometry visit for glaucoma screening and checkup Recommended yearly dental visit for hygiene and checkup  Vaccinations: Influenza vaccine: up to date Pneumococcal vaccine: up to date Tdap vaccine:  Education provided Shingles vaccine: up to date      Preventive Care 65 Years and Older, Female Preventive care refers to lifestyle choices and visits with your health care provider that can promote health and wellness. What does preventive care include? A yearly physical exam. This is also called an annual well check. Dental exams once or twice a year. Routine eye exams. Ask your health care provider how often you should have your eyes checked. Personal lifestyle choices, including: Daily care of your teeth and gums. Regular physical activity. Eating a healthy diet. Avoiding tobacco and drug use. Limiting alcohol  use. Practicing safe sex. Taking low-dose aspirin  every day. Taking vitamin and mineral supplements as recommended by your health care provider. What happens during an annual well check? The services and screenings done by your health care provider during your annual well check will depend on your age, overall health, lifestyle risk factors, and family history of disease. Counseling  Your health care provider may ask you questions about your: Alcohol  use. Tobacco use. Drug use. Emotional well-being. Home and relationship well-being. Sexual activity. Eating habits. History of falls. Memory and ability to understand (cognition). Work and work astronomer. Reproductive health. Screening   You may have the following tests or measurements: Height, weight, and BMI. Blood pressure. Lipid and cholesterol levels. These may be checked every 5 years, or more frequently if you are over 50 years old. Skin check. Lung cancer screening. You may have this screening every year starting at age 17 if you have a 30-pack-year history of smoking and currently smoke or have quit within the past 15 years. Fecal occult blood test (FOBT) of the stool. You may have this test every year starting at age 69. Flexible sigmoidoscopy or colonoscopy. You may have a sigmoidoscopy every 5 years or a colonoscopy every 10 years starting at age 73. Hepatitis C blood test. Hepatitis B blood test. Sexually transmitted disease (STD) testing. Diabetes screening. This is done by checking your blood sugar (glucose) after you have not eaten for a while (fasting). You may have this done every 1-3 years. Bone density scan. This is done to screen for osteoporosis. You may have this done starting at age 29. Mammogram. This may be done every 1-2 years. Talk to your health care provider about how often you should have regular mammograms. Talk with your health care provider about your test results, treatment options, and if necessary, the need for more tests. Vaccines  Your health care provider may recommend certain vaccines, such as: Influenza vaccine. This is recommended every year. Tetanus, diphtheria, and acellular pertussis (Tdap, Td) vaccine. You may need a Td booster every 10 years. Zoster vaccine. You may need this after age 36. Pneumococcal 13-valent conjugate (PCV13) vaccine. One dose is recommended after age 84. Pneumococcal polysaccharide (PPSV23) vaccine. One dose is recommended after age 19. Talk to your health care provider about which screenings and vaccines you need and how often you need them. This  information is not intended to replace advice given to you by your health care provider. Make sure you discuss  any questions you have with your health care provider. Document Released: 01/20/2015 Document Revised: 09/13/2015 Document Reviewed: 10/25/2014 Elsevier Interactive Patient Education  2017 [employer].  Fall Prevention in the Home Falls can cause injuries. They can happen to people of all ages. There are many things you can do to make your home safe and to help prevent falls. What can I do on the outside of my home? Regularly fix the edges of walkways and driveways and fix any cracks. Remove anything that might make you trip as you walk through a door, such as a raised step or threshold. Trim any bushes or trees on the path to your home. Use bright outdoor lighting. Clear any walking paths of anything that might make someone trip, such as rocks or tools. Regularly check to see if handrails are loose or broken. Make sure that both sides of any steps have handrails. Any raised decks and porches should have guardrails on the edges. Have any leaves, snow, or ice cleared regularly. Use sand or salt on walking paths during winter. Clean up any spills in your garage right away. This includes oil or grease spills. What can I do in the bathroom? Use night lights. Install grab bars by the toilet and in the tub and shower. Do not use towel bars as grab bars. Use non-skid mats or decals in the tub or shower. If you need to sit down in the shower, use a plastic, non-slip stool. Keep the floor dry. Clean up any water  that spills on the floor as soon as it happens. Remove soap buildup in the tub or shower regularly. Attach bath mats securely with double-sided non-slip rug tape. Do not have throw rugs and other things on the floor that can make you trip. What can I do in the bedroom? Use night lights. Make sure that you have a light by your bed that is easy to reach. Do not use any sheets or blankets that are too big for your bed. They should not hang down onto the floor. Have a firm chair that has  side arms. You can use this for support while you get dressed. Do not have throw rugs and other things on the floor that can make you trip. What can I do in the kitchen? Clean up any spills right away. Avoid walking on wet floors. Keep items that you use a lot in easy-to-reach places. If you need to reach something above you, use a strong step stool that has a grab bar. Keep electrical cords out of the way. Do not use floor polish or wax that makes floors slippery. If you must use wax, use non-skid floor wax. Do not have throw rugs and other things on the floor that can make you trip. What can I do with my stairs? Do not leave any items on the stairs. Make sure that there are handrails on both sides of the stairs and use them. Fix handrails that are broken or loose. Make sure that handrails are as long as the stairways. Check any carpeting to make sure that it is firmly attached to the stairs. Fix any carpet that is loose or worn. Avoid having throw rugs at the top or bottom of the stairs. If you do have throw rugs, attach them to the floor with carpet tape. Make sure that you have a light switch at the top  of the stairs and the bottom of the stairs. If you do not have them, ask someone to add them for you. What else can I do to help prevent falls? Wear shoes that: Do not have high heels. Have rubber bottoms. Are comfortable and fit you well. Are closed at the toe. Do not wear sandals. If you use a stepladder: Make sure that it is fully opened. Do not climb a closed stepladder. Make sure that both sides of the stepladder are locked into place. Ask someone to hold it for you, if possible. Clearly mark and make sure that you can see: Any grab bars or handrails. First and last steps. Where the edge of each step is. Use tools that help you move around (mobility aids) if they are needed. These include: Canes. Walkers. Scooters. Crutches. Turn on the lights when you go into a dark area.  Replace any light bulbs as soon as they burn out. Set up your furniture so you have a clear path. Avoid moving your furniture around. If any of your floors are uneven, fix them. If there are any pets around you, be aware of where they are. Review your medicines with your doctor. Some medicines can make you feel dizzy. This can increase your chance of falling. Ask your doctor what other things that you can do to help prevent falls. This information is not intended to replace advice given to you by your health care provider. Make sure you discuss any questions you have with your health care provider. Document Released: 10/20/2008 Document Revised: 06/01/2015 Document Reviewed: 01/28/2014 Elsevier Interactive Patient Education  2017 [employer].

## 2023-01-21 NOTE — Progress Notes (Signed)
 Subjective:   Jennifer [name] is a 73 y.o. female who presents for Medicare Annual (Subsequent) preventive examination.  Visit Complete: Virtual I connected with  Latorie Montesano Commisso on 01/21/23 by a video and audio enabled telemedicine application and verified that I am speaking with the correct person using two identifiers.  Patient Location: Home  Provider Location: Home Office  I discussed the limitations of evaluation and management by telemedicine. The patient expressed understanding and agreed to proceed.  Vital Signs: Because this visit was a virtual/telehealth visit, some criteria may be missing or patient reported. Any vitals not documented were not able to be obtained and vitals that have been documented are patient reported.  Patient Medicare AWV questionnaire was completed by the patient on 01-11-2023; I have confirmed that all information answered by patient is correct and no changes since this date.  Cardiac Risk Factors include: advanced age (>11men, >88 women)     Objective:    There were no vitals filed for this visit. There is no height or weight on file to calculate BMI.     01/21/2023    4:03 PM 01/15/2023    2:57 PM 11/07/2021   11:38 AM 09/07/2021    2:57 PM 10/12/2020    9:38 AM 09/05/2020    2:49 PM 08/24/2019    3:08 PM  Advanced Directives  Does Patient Have a Medical Advance Directive? Yes Yes Yes Yes No Yes Yes  Type of Advance Directive Healthcare Power of Attorney Out of facility DNR (pink MOST or yellow form);Healthcare Power of [place] of [place];Living will   Healthcare Power of [place];Out of facility DNR (pink MOST or yellow form) Healthcare Power of [place];Living will;Out of facility DNR (pink MOST or yellow form)  Copy of Healthcare Power of Attorney in Chart? No - copy requested  No - copy requested      Would patient like information on creating a medical advance directive?     No - Patient declined      Current  Medications (verified) Outpatient Encounter Medications as of 01/21/2023  Medication Sig   Biotin  1000 MCG tablet Take 1,000 mcg by mouth daily.   CALCIUM  CARBONATE PO Take 600 mg by mouth daily.    cetirizine (ZYRTEC) 10 MG tablet Take 10 mg by mouth every evening.    Cholecalciferol  (VITAMIN D ) 50 MCG (2000 UT) tablet Take 2,000 Units by mouth daily.   citalopram  (CELEXA ) 20 MG tablet TAKE 1 TABLET BY MOUTH EVERY DAY   ezetimibe  (ZETIA ) 10 MG tablet Take 1 tablet (10 mg total) by mouth daily.   hydroxypropyl methylcellulose / hypromellose (ISOPTO TEARS / GONIOVISC) 2.5 % ophthalmic solution Place 1 drop into both eyes 2 (two) times daily as needed for dry eyes.   levETIRAcetam  (KEPPRA ) 500 MG tablet Take 1 tablet (500 mg total) by mouth 2 (two) times daily.   levothyroxine  (SYNTHROID ) 50 MCG tablet TAKE 1 TABLET BY MOUTH EVERY DAY   losartan  (COZAAR ) 25 MG tablet TAKE 2 TABLETS BY MOUTH EVERY DAY   Multiple Vitamins-Minerals (MULTIPLE VITAMINS/WOMENS PO) Take 1 tablet by mouth daily.    omeprazole  (PRILOSEC) 20 MG capsule Take 1 capsule (20 mg total) by mouth daily.   polyethylene glycol (MIRALAX ) 17 g packet Take 17 g by mouth daily.   pravastatin  (PRAVACHOL ) 80 MG tablet TAKE 1 TABLET BY MOUTH EVERY DAY   sucralfate  (CARAFATE ) 1 g tablet Take 1 tablet (1 g total) by mouth 4 (four) times daily -  with meals and at bedtime.   No facility-administered encounter medications on file as of 01/21/2023.    Allergies (verified) Crestor [rosuvastatin], Neomycin-bacitracin zn-polymyx, Neomycin-bacitracin-polymyxin [bacitracin-neomycin-polymyxin], Codeine, Feldene [piroxicam], Penicillins, Piroxicam, Sulfa antibiotics, and Sulfonamide derivatives   History: Past Medical History:  Diagnosis Date   Allergy    Anxiety    Constipation    Depression    Dysrhythmia    FLB - ? PAC'S COMES AND GOES   Gallbladder problem    GERD (gastroesophageal reflux disease)    High cholesterol    Hx of  adenomatous polyp of colon    2012 - [place]   Hyperlipidemia    Hypertension    Hypothyroid    Joint pain    no current problems   Osteoarthritis    PLMD (periodic limb movement disorder) 04/24/2012   Prediabetes    Seizures (HCC) 08/29/2013   only 1 seizure episode   Sleep apnea    wears CPAP nightly   Vitamin D  deficiency    Past Surgical History:  Procedure Laterality Date   APPENDECTOMY     CESAREAN SECTION      x1   CHOLECYSTECTOMY  1978   COLONOSCOPY     DILATION AND CURETTAGE OF UTERUS     JOINT REPLACEMENT  1998   right knee   OSTEOTOMY PROXIMAL FEMORAL     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   Procedure: TOTAL HIP ARTHROPLASTY ANTERIOR APPROACH;  Surgeon: Dempsey Sensor, MD;  Location: [facility];  Service: Orthopedics;  Laterality: Right;   TOTAL KNEE ARTHROPLASTY Left 07/20/2018   Procedure: Left Knee Arthroplasty;  Surgeon: Sensor Dempsey, MD;  Location: [facility];  Service: Orthopedics;  Laterality: Left;   Family History  Problem Relation Age of Onset   Heart disease Mother 33       Aortic valve disease   Diabetes Mother    Hypertension Mother    Thyroid  disease Mother    Cancer Father 52   Esophageal cancer Father    Alcoholism Father    Sleep apnea Sister    Sleep apnea Sister    Atrial fibrillation Brother    Sleep apnea Brother    Stomach cancer Maternal Grandmother    Colon cancer Neg Hx    Rectal cancer Neg Hx    Colon polyps Neg Hx    Social History   Socioeconomic History   Marital status: Divorced    Spouse name: Not on file   Number of children: 1   Years of education: Not on file   Highest education level: Associate degree: occupational, scientist, product/process development, or vocational program  Occupational History   Occupation: Retired    Associate Professor: [employer]  Tobacco Use   Smoking status: Never   Smokeless tobacco: Never  Vaping Use   Vaping status: Never Used  Substance and Sexual Activity   Alcohol  use: No    Alcohol /week: 0.0 standard drinks of  alcohol    Drug use: No   Sexual activity: Never    Birth control/protection: Post-menopausal  Other Topics Concern   Not on file  Social History Narrative   Lives with blind dog and cat.    Right handed    Social Drivers of Health   Financial Resource Strain: Low Risk  (01/21/2023)   Overall Financial Resource Strain (CARDIA)    Difficulty of Paying Living Expenses: Not hard at all  Food Insecurity: No Food Insecurity (01/21/2023)   Hunger Vital Sign    Worried About Programme Researcher, Broadcasting/film/video in  the Last Year: Never true    Ran Out of Food in the Last Year: Never true  Transportation Needs: No Transportation Needs (01/21/2023)   PRAPARE - Administrator, Civil Service (Medical): No    Lack of Transportation (Non-Medical): No  Physical Activity: Inactive (01/21/2023)   Exercise Vital Sign    Days of Exercise per Week: 0 days    Minutes of Exercise per Session: 0 min  Stress: No Stress Concern Present (01/21/2023)   [employer] of Occupational Health - Occupational Stress Questionnaire    Feeling of Stress : Only a little  Social Connections: Moderately Integrated (01/21/2023)   Social Connection and Isolation Panel [NHANES]    Frequency of Communication with Friends and Family: More than three times a week    Frequency of Social Gatherings with Friends and Family: Three times a week    Attends Religious Services: More than 4 times per year    Active Member of Clubs or Organizations: Yes    Attends Banker Meetings: More than 4 times per year    Marital Status: Divorced    Tobacco Counseling Counseling given: Not Answered   Clinical Intake:                        Activities of Daily Living    01/21/2023    4:06 PM  In your present state of health, do you have any difficulty performing the following activities:  Hearing? 0  Vision? 0  Difficulty concentrating or making decisions? 0  Walking or climbing stairs? 0  Dressing or bathing?  0  Doing errands, shopping? 0  Preparing Food and eating ? N  Using the Toilet? N  In the past six months, have you accidently leaked urine? Y  Do you have problems with loss of bowel control? N  Managing your Medications? N  Managing your Finances? N  Housekeeping or managing your Housekeeping? N    Patient Care Team: Levora Reyes SAUNDERS, MD as PCP - General (Family Medicine) Georjean Darice HERO, MD as Consulting Physician (Neurology) Buck Saucer, MD as Attending Physician (Neurology) Verdon Louann BIRCH, MD as Consulting Physician (Family Medicine)  Indicate any recent Medical Services you may have received from other than [facility] providers in the past year (date may be approximate).     Assessment:   This is a routine wellness examination for Breianna.  Hearing/Vision screen Hearing Screening - Comments:: No trouble hearing Vision Screening - Comments:: [facility] [place] Up to date   Goals Addressed             This Visit's Progress    Weight (lb) < 200 lb (90.7 kg)         Depression Screen    01/21/2023    4:04 PM 09/23/2022    3:19 PM 06/24/2022    1:05 PM 11/07/2021   11:37 AM 03/12/2021   11:39 AM 10/12/2020    9:36 AM 10/12/2020    9:24 AM  PHQ 2/9 Scores  PHQ - 2 Score 3 2 1  0 0 1 1  PHQ- 9 Score 3 5 8  1 3 3     Fall Risk    01/21/2023    4:15 PM 01/15/2023    2:57 PM 09/23/2022    3:18 PM 06/24/2022    1:05 PM 11/07/2021   11:35 AM  Fall Risk   Falls in the past year? 0 0 1 0 0  Number  falls in past yr: 0 0 1 0 0  Injury with Fall? 0 0 0 0 0  Risk for fall due to :   Other (Comment) No Fall Risks No Fall Risks  Follow up Falls evaluation completed;Education provided;Falls prevention discussed Falls evaluation completed Falls evaluation completed Falls evaluation completed Falls prevention discussed    MEDICARE RISK AT HOME: Medicare Risk at Home Any stairs in or around the home?: Yes If so, are there any without handrails?: No Home free of loose  throw rugs in walkways, pet beds, electrical cords, etc?: Yes Adequate lighting in your home to reduce risk of falls?: Yes Life alert?: No Use of a cane, walker or w/c?: No Grab bars in the bathroom?: No Shower chair or bench in shower?: No Elevated toilet seat or a handicapped toilet?: No  TIMED UP AND GO:  Was the test performed?  No    Cognitive Function:    04/19/2019   12:07 PM  MMSE - Mini Mental State Exam  Orientation to time 5  Orientation to Place 5  Registration 3  Attention/ Calculation 5  Recall 3  Language- repeat 1        01/21/2023    4:04 PM 11/07/2021   11:38 AM  6CIT Screen  What Year? 0 points 0 points  What month? 0 points 0 points  What time? 0 points 0 points  Count back from 20 0 points 0 points  Months in reverse 0 points 0 points  Repeat phrase 0 points 0 points  Total Score 0 points 0 points    Immunizations Immunization History  Administered Date(s) Administered   Fluad Quad(high Dose 65+) 10/29/2019, 09/07/2020   Influenza, High Dose Seasonal PF 10/08/2016, 09/17/2017, 09/08/2018   Influenza,inj,Quad PF,6+ Mos 09/02/2015   Influenza-Unspecified 11/06/2013   Moderna Sars-Covid-2 Vaccination 02/10/2019, 03/10/2019   PFIZER(Purple Top)SARS-COV-2 Vaccination 11/01/2019, 06/03/2020   Pfizer(Comirnaty)Fall Seasonal Vaccine 12 years and older 10/30/2022   Pneumococcal Conjugate-13 03/08/2016   Pneumococcal Polysaccharide-23 12/19/2019   Tdap 06/18/2011   Zoster Recombinant(Shingrix) 07/19/2019, 11/18/2019    TDAP status: Up to date  Flu Vaccine status: Up to date  Pneumococcal vaccine status: Up to date  Covid-19 vaccine status: Information provided on how to obtain vaccines.   Qualifies for Shingles Vaccine? No   Zostavax completed Yes   Shingrix Completed?: Yes  Screening Tests Health Maintenance  Topic Date Due   DTaP/Tdap/Td (2 - Td or Tdap) 01/21/2024 (Originally 06/17/2021)   COVID-19 Vaccine (6 - 2024-25 season)  07/13/2024 (Originally 12/25/2022)   MAMMOGRAM  09/06/2023   Medicare Annual Wellness (AWV)  01/21/2024   Colonoscopy  06/17/2027   Pneumonia Vaccine 63+ Years old  Completed   INFLUENZA VACCINE  Completed   DEXA SCAN  Completed   Hepatitis C Screening  Completed   Zoster Vaccines- Shingrix  Completed   HPV VACCINES  Aged Out    Health Maintenance  There are no preventive care reminders to display for this patient.   Colorectal cancer screening: Type of screening: Colonoscopy. Completed 2024. Repeat every 5 years  Mammogram status: Completed  . Repeat every year  Bone Density status: Completed 2023. Results reflect: Bone density results: NORMAL. Repeat every 0 years.  Lung Cancer Screening: (Low Dose CT Chest recommended if Age 65-80 years, 20 pack-year currently smoking OR have quit w/in 15years.) does not qualify.   Lung Cancer Screening Referral:   Additional Screening:  Hepatitis C Screening: does not qualify; Completed 2021  Vision Screening: Recommended annual  ophthalmology exams for early detection of glaucoma and other disorders of the eye. Is the patient up to date with their annual eye exam?  Yes  Who is the provider or what is the name of the office in which the patient attends annual eye exams? [facility] If pt is not established with a provider, would they like to be referred to a provider to establish care? No .   Dental Screening: Recommended annual dental exams for proper oral hygiene    Community Resource Referral / Chronic Care Management: CRR required this visit?  No   CCM required this visit?  No     Plan:     I have personally reviewed and noted the following in the patient's chart:   Medical and social history Use of alcohol , tobacco or illicit drugs  Current medications and supplements including opioid prescriptions. Patient is not currently taking opioid prescriptions. Functional ability and status Nutritional status Physical  activity Advanced directives List of other physicians Hospitalizations, surgeries, and ER visits in previous 12 months Vitals Screenings to include cognitive, depression, and falls Referrals and appointments  In addition, I have reviewed and discussed with patient certain preventive protocols, quality metrics, and best practice recommendations. A written personalized care plan for preventive services as well as general preventive health recommendations were provided to patient.     Mliss Graff, LPN   [id number]   After Visit Summary: (MyChart) Due to this being a telephonic visit, the after visit summary with patients personalized plan was offered to patient via MyChart   Nurse Notes:

## 2023-02-05 ENCOUNTER — Encounter (INDEPENDENT_AMBULATORY_CARE_PROVIDER_SITE_OTHER): Payer: Self-pay | Admitting: Family Medicine

## 2023-02-05 ENCOUNTER — Ambulatory Visit (INDEPENDENT_AMBULATORY_CARE_PROVIDER_SITE_OTHER): Payer: Medicare Other | Admitting: Family Medicine

## 2023-02-05 VITALS — BP 132/62 | HR 71 | Temp 98.2°F | Ht 62.0 in | Wt 188.0 lb

## 2023-02-05 DIAGNOSIS — Z6834 Body mass index (BMI) 34.0-34.9, adult: Secondary | ICD-10-CM

## 2023-02-05 DIAGNOSIS — K5900 Constipation, unspecified: Secondary | ICD-10-CM | POA: Diagnosis not present

## 2023-02-05 DIAGNOSIS — E559 Vitamin D deficiency, unspecified: Secondary | ICD-10-CM

## 2023-02-05 DIAGNOSIS — I10 Essential (primary) hypertension: Secondary | ICD-10-CM | POA: Diagnosis not present

## 2023-02-05 DIAGNOSIS — E669 Obesity, unspecified: Secondary | ICD-10-CM

## 2023-02-05 DIAGNOSIS — E785 Hyperlipidemia, unspecified: Secondary | ICD-10-CM

## 2023-02-05 DIAGNOSIS — R5383 Other fatigue: Secondary | ICD-10-CM

## 2023-02-05 DIAGNOSIS — R7303 Prediabetes: Secondary | ICD-10-CM

## 2023-02-05 DIAGNOSIS — K5909 Other constipation: Secondary | ICD-10-CM

## 2023-02-05 NOTE — Progress Notes (Signed)
.smr  Office: [phone]  /  Fax: [phone]  WEIGHT SUMMARY AND BIOMETRICS  Anthropometric Measurements Height: 5\' 2"  (1.575 m) Weight: 188 lb (85.3 kg) BMI (Calculated): 34.38 Weight at Last Visit: 183 lb Weight Lost Since Last Visit: 0 Weight Gained Since Last Visit: 5 lb Starting Weight: 208 lb Total Weight Loss (lbs): 20 lb (9.072 kg) Peak Weight: 250 lb   Body Composition  Body Fat %: 45.8 % Fat Mass (lbs): 86.2 lbs Muscle Mass (lbs): 97 lbs Total Body Water (lbs): 77 lbs Visceral Fat Rating : 14   Other Clinical Data Fasting: Yes Labs: No Today's Visit #: 32 Starting Date: 08/25/18    Chief Complaint: OBESITY   History of Present Illness   The patient presents with obesity.  She has gained five pounds over the last six weeks, coinciding with the Christmas and New Year's holidays. She has been following her category one plan only about thirty percent of the time and is not currently exercising. She describes a period of increased sugar consumption, stating it was 'probably one of the worst months' she has had in a long time. She has since stopped consuming sugar, with her last intake being three days ago. She is currently consuming frozen strawberries as a substitute for sweets.  She has a history of hypertension and is currently on losartan, with her blood pressure controlled at 132/62 mmHg. She is working on diet, exercise, and weight loss to improve her blood pressure.  She is experiencing constipation and is taking generic Miralax, half a cap daily. A CT scan was negative for any other issues. She reports bloating and discomfort, but Miralax does not affect her other medications.  She experiences fatigue, particularly after lunch, which consists of a sandwich, pickles, and a 100-calorie [place] bar She often falls asleep in the afternoon and struggles to stay awake while reading, especially nonfiction. She feels sluggish and is trying to get back into a  routine.  She mentions feeling bloated and attributes some of her abdominal discomfort to factors other than weight gain. Her diet includes two eggs, two toasts with sugar-free jelly for breakfast, and four ounces of Danish ham with cheese and low-calorie bread for lunch. She often eats a whole bag of frozen peas as her vegetable intake and is trying to incorporate more protein, such as chicken breast, into her dinners.          PHYSICAL EXAM:  Blood pressure 132/62, pulse 71, temperature 98.2 F (36.8 C), height 5\' 2"  (1.575 m), weight 188 lb (85.3 kg), SpO2 95%. Body mass index is 34.39 kg/m.  DIAGNOSTIC DATA REVIEWED:  BMET    Component Value Date/Time   NA 137 12/25/2022 1018   K 4.4 12/25/2022 1018   CL 98 12/25/2022 1018   CO2 20 12/25/2022 1018   GLUCOSE 80 12/25/2022 1018   GLUCOSE 77 10/14/2022 1422   BUN 20 12/25/2022 1018   CREATININE 0.70 12/25/2022 1018   CREATININE 0.72 09/02/2015 1207   CALCIUM 10.0 12/25/2022 1018   GFRNONAA 84 01/17/2020 1106   GFRNONAA 89 09/02/2015 1207   GFRAA 96 01/17/2020 1106   GFRAA >89 09/02/2015 1207   Lab Results  Component Value Date   HGBA1C 5.6 12/25/2022   HGBA1C 5.9 03/13/2016   Lab Results  Component Value Date   INSULIN 9.1 12/25/2022   INSULIN 14.7 08/25/2018   Lab Results  Component Value Date   TSH 2.410 12/25/2022   CBC    Component Value Date/Time  WBC 5.8 10/14/2022 1422   RBC 4.21 10/14/2022 1422   HGB 12.9 10/14/2022 1422   HGB 13.6 06/12/2022 1021   HCT 38.4 10/14/2022 1422   HCT 40.5 06/12/2022 1021   PLT 316.0 10/14/2022 1422   PLT 314 06/12/2022 1021   MCV 91.1 10/14/2022 1422   MCV 90 06/12/2022 1021   MCH 30.4 06/12/2022 1021   MCH 31.4 07/21/2018 0304   MCHC 33.7 10/14/2022 1422   RDW 12.8 10/14/2022 1422   RDW 11.9 06/12/2022 1021   Iron Studies    Component Value Date/Time   IRON 110 11/01/2019 0853   TIBC 382 11/01/2019 0853   IRONPCTSAT 29 11/01/2019 0853   Lipid Panel      Component Value Date/Time   CHOL 169 12/25/2022 1018   TRIG 139 12/25/2022 1018   HDL 57 12/25/2022 1018   CHOLHDL 3.8 12/25/2020 1230   CHOLHDL 5 05/04/2018 0805   VLDL 78.4 (H) 05/04/2018 0805   LDLCALC 88 12/25/2022 1018   LDLDIRECT 96.0 05/04/2018 0805   Hepatic Function Panel     Component Value Date/Time   PROT 7.4 12/25/2022 1018   ALBUMIN 4.7 12/25/2022 1018   AST 20 12/25/2022 1018   ALT 20 12/25/2022 1018   ALKPHOS 100 12/25/2022 1018   BILITOT 0.5 12/25/2022 1018      Component Value Date/Time   TSH 2.410 12/25/2022 1018   Nutritional Lab Results  Component Value Date   VD25OH 39.5 12/25/2022   VD25OH 40.8 06/12/2022   VD25OH 45.9 12/17/2021     Assessment and Plan    Obesity Gained five pounds in the last six weeks due to holiday eating and poor adherence to dietary plan. Following category one plan about 30% of the time and not exercising. Discussed impact of simple carbohydrates on insulin levels and cravings. Started reducing sugar intake and consuming healthier options like frozen strawberries. Discussed low-carb plan for 2-4 weeks to detox from cravings, with the hardest part being the first three days. Prefers [employer] for exercise. - Continue category one dietary plan - Follow a low-carb plan for 2-4 weeks to detox from cravings - Reassess energy levels and dietary adherence in two weeks - Delay starting exercise until feeling more energetic - Consider starting with [place] walking videos at home when ready  Pre-diabetes Hemoglobin A1c is 5.6, within the pre-diabetic range. This was measured around the holidays, which may have influenced the result. Insulin levels are below 10. - Monitor A1c and insulin levels - Encourage adherence to dietary plan to prevent progression to diabetes  Hypertension Currently managed with losartan. Blood pressure is controlled at 132/62. Working on diet, exercise, and weight loss to further  improve blood pressure. - Continue losartan - Monitor blood pressure regularly - Encourage adherence to dietary and weight loss plans  Hyperlipidemia Cholesterol levels have significantly improved with Zetia. Triglycerides and LDL levels are dramatically lower. - Continue Zetia - Monitor cholesterol levels regularly  Constipation Reports constipation and is taking generic Miralax. Negative CT scan and no other abnormalities noted by gastroenterologist. Eather Colas does not affect absorption of other medications and works by preventing the large intestine from pulling too much water out, thus preventing hard and painful bowel movements. - Continue taking Miralax as directed - Monitor bowel movements and adjust dosage if necessary  Vitamin D Deficiency Vitamin D levels are currently at 39, below the target of 50-60. Using over-the-counter vitamin D but has prescription vitamin D available. - Switch to  prescription vitamin D, especially during winter - Recheck vitamin D levels at next appointment  General Health Maintenance Overall lab results are good, with kidneys, liver, and electrolytes all within normal ranges. Thyroid function is on target. - Continue current health maintenance practices  Follow-up - Schedule next appointment in March - Send MyChart message if additional prescription vitamin D is needed.          She was informed of the importance of frequent follow up visits to maximize her success with intensive lifestyle modifications for her multiple health conditions.    Quillian Quince, MD

## 2023-02-06 ENCOUNTER — Ambulatory Visit: Payer: Medicare Other | Admitting: Family Medicine

## 2023-02-06 ENCOUNTER — Encounter (INDEPENDENT_AMBULATORY_CARE_PROVIDER_SITE_OTHER): Payer: Self-pay | Admitting: Family Medicine

## 2023-02-06 DIAGNOSIS — E559 Vitamin D deficiency, unspecified: Secondary | ICD-10-CM

## 2023-02-08 ENCOUNTER — Other Ambulatory Visit: Payer: Self-pay | Admitting: Family Medicine

## 2023-02-08 DIAGNOSIS — E039 Hypothyroidism, unspecified: Secondary | ICD-10-CM

## 2023-02-10 ENCOUNTER — Encounter: Payer: Self-pay | Admitting: Family Medicine

## 2023-02-10 DIAGNOSIS — E039 Hypothyroidism, unspecified: Secondary | ICD-10-CM

## 2023-02-10 MED ORDER — VITAMIN D (ERGOCALCIFEROL) 1.25 MG (50000 UNIT) PO CAPS
50000.0000 [IU] | ORAL_CAPSULE | ORAL | 0 refills | Status: DC
Start: 2023-02-10 — End: 2023-03-05

## 2023-02-10 MED ORDER — LEVOTHYROXINE SODIUM 50 MCG PO TABS: 50.0000 ug | ORAL_TABLET | Freq: Every day | ORAL | 2 refills | Status: DC

## 2023-02-10 NOTE — Telephone Encounter (Signed)
[place] to rf x 1

## 2023-03-03 ENCOUNTER — Other Ambulatory Visit (INDEPENDENT_AMBULATORY_CARE_PROVIDER_SITE_OTHER): Payer: Self-pay | Admitting: Family Medicine

## 2023-03-03 DIAGNOSIS — E559 Vitamin D deficiency, unspecified: Secondary | ICD-10-CM

## 2023-03-05 ENCOUNTER — Encounter (INDEPENDENT_AMBULATORY_CARE_PROVIDER_SITE_OTHER): Payer: Self-pay | Admitting: Family Medicine

## 2023-03-05 ENCOUNTER — Ambulatory Visit (INDEPENDENT_AMBULATORY_CARE_PROVIDER_SITE_OTHER): Payer: Medicare Other | Admitting: Family Medicine

## 2023-03-05 VITALS — BP 131/73 | HR 71 | Temp 98.1°F | Ht 62.0 in | Wt 186.0 lb

## 2023-03-05 DIAGNOSIS — F439 Reaction to severe stress, unspecified: Secondary | ICD-10-CM | POA: Diagnosis not present

## 2023-03-05 DIAGNOSIS — I10 Essential (primary) hypertension: Secondary | ICD-10-CM

## 2023-03-05 DIAGNOSIS — E669 Obesity, unspecified: Secondary | ICD-10-CM

## 2023-03-05 DIAGNOSIS — Z6834 Body mass index (BMI) 34.0-34.9, adult: Secondary | ICD-10-CM

## 2023-03-05 DIAGNOSIS — E559 Vitamin D deficiency, unspecified: Secondary | ICD-10-CM

## 2023-03-05 MED ORDER — VITAMIN D (ERGOCALCIFEROL) 1.25 MG (50000 UNIT) PO CAPS
50000.0000 [IU] | ORAL_CAPSULE | ORAL | 0 refills | Status: DC
Start: 2023-03-05 — End: 2023-04-30

## 2023-03-05 NOTE — Progress Notes (Signed)
 .smr  Office: [phone]  /  Fax: [phone]  WEIGHT SUMMARY AND BIOMETRICS  Anthropometric Measurements Height: 5\' 2"  (1.575 m) Weight: 186 lb (84.4 kg) BMI (Calculated): 34.01 Weight at Last Visit: 188 lb Weight Lost Since Last Visit: 2 lb Weight Gained Since Last Visit: 0 Starting Weight: 208 lb Total Weight Loss (lbs): 22 lb (9.979 kg) Peak Weight: 250 lb   Body Composition  Body Fat %: 44.6 % Fat Mass (lbs): 83 lbs Muscle Mass (lbs): 97.8 lbs Total Body Water (lbs): 74 lbs Visceral Fat Rating : 14   Other Clinical Data Fasting: Yes Labs: No Today's Visit #: 33    Chief Complaint: OBESITY    History of Present Illness   Jennifer [name] "Eunice Blase" is a 73 year old female with obesity and hypertension who presents for obesity management and monitoring.  She has lost two pounds in the last month since her last visit. She follows her category one eating plan about fifty percent of the time and is not currently exercising. She wants to improve her adherence to the plan and to start exercising by March.  She feels 'blah' and struggles with motivation, noting a lack of interest in activities such as her church small group. She attributes some of her feelings to the winter season and mentions watching a lot of news, which she finds distressing. She has been eating more carbohydrates, including wheat thins and candy, and recognizes the need to avoid these foods.  She has a history of hypertension and is currently taking losartan. She is working on weight loss to help improve her blood pressure.  She mentions a recent increase in visceral fat, noting that her visceral fat level has risen from 11 to 14 since July of the previous year, alongside a weight gain of approximately ten pounds. She is aware that her weight gain has been primarily central and is concerned about the impact on her health.  She has been taking vitamin D and requests a refill.    She plans to  continue her appointments and is determined to improve her health by adhering to her eating plan and incorporating exercise.          PHYSICAL EXAM:  Blood pressure 131/73, pulse 71, temperature 98.1 F (36.7 C), height 5\' 2"  (1.575 m), weight 186 lb (84.4 kg), SpO2 96%. Body mass index is 34.02 kg/m.  DIAGNOSTIC DATA REVIEWED:  BMET    Component Value Date/Time   NA 137 12/25/2022 1018   K 4.4 12/25/2022 1018   CL 98 12/25/2022 1018   CO2 20 12/25/2022 1018   GLUCOSE 80 12/25/2022 1018   GLUCOSE 77 10/14/2022 1422   BUN 20 12/25/2022 1018   CREATININE 0.70 12/25/2022 1018   CREATININE 0.72 09/02/2015 1207   CALCIUM 10.0 12/25/2022 1018   GFRNONAA 84 01/17/2020 1106   GFRNONAA 89 09/02/2015 1207   GFRAA 96 01/17/2020 1106   GFRAA >89 09/02/2015 1207   Lab Results  Component Value Date   HGBA1C 5.6 12/25/2022   HGBA1C 5.9 03/13/2016   Lab Results  Component Value Date   INSULIN 9.1 12/25/2022   INSULIN 14.7 08/25/2018   Lab Results  Component Value Date   TSH 2.410 12/25/2022   CBC    Component Value Date/Time   WBC 5.8 10/14/2022 1422   RBC 4.21 10/14/2022 1422   HGB 12.9 10/14/2022 1422   HGB 13.6 06/12/2022 1021   HCT 38.4 10/14/2022 1422   HCT 40.5 06/12/2022 1021  PLT 316.0 10/14/2022 1422   PLT 314 06/12/2022 1021   MCV 91.1 10/14/2022 1422   MCV 90 06/12/2022 1021   MCH 30.4 06/12/2022 1021   MCH 31.4 07/21/2018 0304   MCHC 33.7 10/14/2022 1422   RDW 12.8 10/14/2022 1422   RDW 11.9 06/12/2022 1021   Iron Studies    Component Value Date/Time   IRON 110 11/01/2019 0853   TIBC 382 11/01/2019 0853   IRONPCTSAT 29 11/01/2019 0853   Lipid Panel     Component Value Date/Time   CHOL 169 12/25/2022 1018   TRIG 139 12/25/2022 1018   HDL 57 12/25/2022 1018   CHOLHDL 3.8 12/25/2020 1230   CHOLHDL 5 05/04/2018 0805   VLDL 78.4 (H) 05/04/2018 0805   LDLCALC 88 12/25/2022 1018   LDLDIRECT 96.0 05/04/2018 0805   Hepatic Function Panel      Component Value Date/Time   PROT 7.4 12/25/2022 1018   ALBUMIN 4.7 12/25/2022 1018   AST 20 12/25/2022 1018   ALT 20 12/25/2022 1018   ALKPHOS 100 12/25/2022 1018   BILITOT 0.5 12/25/2022 1018      Component Value Date/Time   TSH 2.410 12/25/2022 1018   Nutritional Lab Results  Component Value Date   VD25OH 39.5 12/25/2022   VD25OH 40.8 06/12/2022   VD25OH 45.9 12/17/2021     Assessment and Plan    Obesity Obesity with recent 2-pound weight loss over the past month. Following category one eating plan 50% of the time, not currently exercising. Increased visceral fat (14, up from 11 in July). Stress and dietary habits contributing to central weight gain. Discussed reducing simple carbohydrates and stress-related eating, emphasizing protein, vegetables, and low-sugar fruits. Explained exercise's role in managing insulin levels, with nutrition being paramount. Highlighted stress and cortisol's impact on weight gain and the importance of stress management. - Encourage adherence to category one eating plan - Recommend starting with small, manageable exercise goals (e.g., 5 minutes of Leslie Sansone's walk) - Advise reducing intake of simple carbohydrates and stress-related eating - Encourage consumption of protein, vegetables, and low-sugar fruits - Recommend removing tempting foods from the house - Continue vitamin D supplementation until next blood work - Follow-up appointment in March  Hypertension Hypertension managed with losartan. Blood pressure today is 131/73 mmHg. Weight loss efforts aimed at improving blood pressure control. Discussed stress management's role in hypertension. - Continue losartan - Monitor blood pressure regularly - Encourage weight loss and stress management to aid in blood pressure control  Stress Discussed lifestyle modifications, including dietary changes and exercise. Emphasized regular physical activity, healthy eating habits, and stress management  techniques. - Encourage regular physical activity - Advise on healthy eating habits - Recommend stress management techniques - Continue monitoring weight and visceral fat levels  Follow-up - Ensure follow-up appointments in March and April are kept - Monitor progress on weight loss and adherence to dietary and exercise plans.         I have personally spent 40 minutes total time today in preparation, patient care, and documentation for this visit, including the following: review of clinical lab tests; review of medical tests/procedures/services.    She was informed of the importance of frequent follow up visits to maximize her success with intensive lifestyle modifications for her multiple health conditions.    Quillian Quince, MD

## 2023-03-08 ENCOUNTER — Ambulatory Visit
Admission: RE | Admit: 2023-03-08 | Discharge: 2023-03-08 | Disposition: A | Discharge status: Home / Self Care | Source: Ambulatory Visit | Attending: Internal Medicine | Admitting: Internal Medicine

## 2023-03-08 ENCOUNTER — Other Ambulatory Visit: Payer: Self-pay

## 2023-03-08 VITALS — BP 148/80 | HR 66 | Temp 98.3°F | Resp 16

## 2023-03-08 DIAGNOSIS — M7752 Other enthesopathy of left foot: Secondary | ICD-10-CM | POA: Diagnosis not present

## 2023-03-08 MED ORDER — PREDNISONE 20 MG PO TABS: 40.0000 mg | ORAL_TABLET | Freq: Every day | ORAL | 0 refills | Status: AC

## 2023-03-08 NOTE — ED Triage Notes (Signed)
 Pt c/o left bottom foot pain radiating to the top of her food that started yesterday morning. Pt denies any fall or injury.

## 2023-03-08 NOTE — Discharge Instructions (Signed)
 Take prednisone 40mg  once daily for the next 5 days with food to reduce inflammation to the left ankle/foot. Please schedule an appointment with [facility] for follow-up for further evaluation.  If you develop any new or worsening symptoms or if your symptoms do not start to improve, please return here or follow-up with your primary care provider. If your symptoms are severe, please go to the emergency room.

## 2023-03-08 NOTE — ED Provider Notes (Signed)
 [name]    CSN: [id number] Arrival date & time: 03/08/23  1106      History   Chief Complaint Chief Complaint  Patient presents with   Foot Pain    Foot/ankle pain, throbbing, no injury, woke up with it yesterday, taking Tylenol, using cane - Entered by patient    HPI Jennifer [name] is a 73 y.o. female.   Patient presents to urgent care for evaluation of pain to the left foot and ankle that started yesterday upon standing and with weight bearing activity. Pain initially started as a throbbing sensation to the plantar aspect of the left foot and is now a throbbing sensation to the left anterior lateral ankle. Pain improves with movement and throughout the day then returns at night when she rests. Denies recent trauma/injuries to the left foot/ankle, numbness/tingling distally to area of pain, redness/swelling to the left foot/ankle, and fevers/chills. She has been taking tylenol with minimal relief of symptoms.    Foot Pain    Past Medical History:  Diagnosis Date   Allergy    Anxiety    Constipation    Depression    Dysrhythmia    FLB - ? PAC'S COMES AND GOES   Gallbladder problem    GERD (gastroesophageal reflux disease)    High cholesterol    Hx of adenomatous polyp of colon    2012 - [facility]   Hyperlipidemia    Hypertension    Hypothyroid    Joint pain    no current problems   Osteoarthritis    PLMD (periodic limb movement disorder) 04/24/2012   Prediabetes    Seizures (HCC) 08/29/2013   only 1 seizure episode   Sleep apnea    wears CPAP nightly   Vitamin D deficiency     Patient Active Problem List   Diagnosis Date Noted   Other hyperlipidemia 06/12/2022   Stress 04/17/2022   BMI 32.0-32.9,adult 02/20/2022   Obesity, Beginning BMI 36.8 02/20/2022   Hypothyroidism 12/17/2021   Mouth abscess 12/17/2021   Hyperglycemia 12/17/2021   Pain of right hip 10/01/2021   Mixed hyperlipidemia 06/06/2021   Insulin resistance 06/06/2021    Vitamin D deficiency 05/07/2021   Elevated LFTs 11/01/2019   Abrasion of left buttock 08/05/2018   S/P total knee arthroplasty, left 07/20/2018   Degenerative arthritis of left knee 07/17/2018   Other constipation 12/20/2017   Class 2 severe obesity with serious comorbidity and body mass index (BMI) of 36.0 to 36.9 in adult [facility]) 12/20/2017   Primary osteoarthritis of right hip 10/20/2015   Arthritis of right hip 10/20/2015   Localization-related idiopathic epilepsy and epileptic syndromes with seizures of localized onset, not intractable, without status epilepticus (HCC) 09/09/2014   Depression with anxiety 07/19/2014   Hx of adenomatous polyp of colon 03/29/2014   Seizure after head injury (HCC) 08/29/2013   OSA (obstructive sleep apnea) 04/24/2012   PLMD (periodic limb movement disorder) 04/24/2012   Fibroids, submucosal 12/18/2010   HYPERLIPIDEMIA 07/04/2008   Essential hypertension 07/04/2008   BAKER'S CYST, LEFT KNEE 07/04/2008   Shortened PR interval 07/04/2008    Past Surgical History:  Procedure Laterality Date   APPENDECTOMY     CESAREAN SECTION      x1   CHOLECYSTECTOMY  1978   COLONOSCOPY     DILATION AND CURETTAGE OF UTERUS     JOINT REPLACEMENT  1998   right knee   OSTEOTOMY PROXIMAL FEMORAL     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  Procedure: TOTAL HIP ARTHROPLASTY ANTERIOR APPROACH;  Surgeon: Gean Birchwood, MD;  Location: [facility] OR;  Service: Orthopedics;  Laterality: Right;   TOTAL KNEE ARTHROPLASTY Left 07/20/2018   Procedure: Left Knee Arthroplasty;  Surgeon: Gean Birchwood, MD;  Location: [facility];  Service: Orthopedics;  Laterality: Left;    OB History     Gravida  3   Para      Term      Preterm      AB      Living         SAB      IAB      Ectopic      Multiple      Live Births               Home Medications    Prior to Admission medications   Medication Sig Start Date End Date Taking? Authorizing Provider  predniSONE  (DELTASONE) 20 MG tablet Take 2 tablets (40 mg total) by mouth daily with breakfast for 5 days. 03/08/23 03/13/23 Yes Carlisle Beers, FNP  Biotin 1000 MCG tablet Take 1,000 mcg by mouth daily.    [provider]  CALCIUM CARBONATE PO Take 600 mg by mouth daily.     [provider]  cetirizine (ZYRTEC) 10 MG tablet Take 10 mg by mouth every evening.     [provider]  Cholecalciferol (VITAMIN D) 50 MCG (2000 UT) tablet Take 2,000 Units by mouth daily.    [provider]  citalopram (CELEXA) 20 MG tablet TAKE 1 TABLET BY MOUTH EVERY DAY 10/23/22   Shade Flood, MD  ezetimibe (ZETIA) 10 MG tablet Take 1 tablet (10 mg total) by mouth daily. 06/24/22   Shade Flood, MD  hydroxypropyl methylcellulose / hypromellose (ISOPTO TEARS / GONIOVISC) 2.5 % ophthalmic solution Place 1 drop into both eyes 2 (two) times daily as needed for dry eyes.    [provider]  levETIRAcetam (KEPPRA) 500 MG tablet Take 1 tablet (500 mg total) by mouth 2 (two) times daily. 01/15/23   Van Clines, MD  levothyroxine (SYNTHROID) 50 MCG tablet Take 1 tablet (50 mcg total) by mouth daily. 02/10/23   Shade Flood, MD  losartan (COZAAR) 25 MG tablet TAKE 2 TABLETS BY MOUTH EVERY DAY 09/12/22   Shade Flood, MD  Multiple Vitamins-Minerals (MULTIPLE VITAMINS/WOMENS PO) Take 1 tablet by mouth daily.     [provider]  omeprazole (PRILOSEC) 20 MG capsule Take 1 capsule (20 mg total) by mouth daily. 09/30/22   Shade Flood, MD  polyethylene glycol (MIRALAX) 17 g packet Take 17 g by mouth daily. 12/25/22   Quillian Quince D, MD  pravastatin (PRAVACHOL) 80 MG tablet TAKE 1 TABLET BY MOUTH EVERY DAY 11/25/22   Shade Flood, MD  sucralfate (CARAFATE) 1 g tablet Take 1 tablet (1 g total) by mouth 4 (four) times daily -  with meals and at bedtime. 09/30/22   Shade Flood, MD  Vitamin D, Ergocalciferol, (DRISDOL) 1.25 MG (50000 UNIT) CAPS capsule Take 1  capsule (50,000 Units total) by mouth every 7 (seven) days. 03/05/23   Wilder Glade, MD    Family History Family History  Problem Relation Age of Onset   Heart disease Mother 9       Aortic valve disease   Diabetes Mother    Hypertension Mother    Thyroid disease Mother    Cancer Father 75   Esophageal cancer Father  Alcoholism Father    Sleep apnea Sister    Sleep apnea Sister    Atrial fibrillation Brother    Sleep apnea Brother    Stomach cancer Maternal Grandmother    Colon cancer Neg Hx    Rectal cancer Neg Hx    Colon polyps Neg Hx     Social History Social History   Tobacco Use   Smoking status: Never   Smokeless tobacco: Never  Vaping Use   Vaping status: Never Used  Substance Use Topics   Alcohol use: No    Alcohol/week: 0.0 standard drinks of alcohol   Drug use: No     Allergies   Crestor [rosuvastatin], Neomycin-bacitracin zn-polymyx, Neomycin-bacitracin-polymyxin [bacitracin-neomycin-polymyxin], Codeine, Feldene [piroxicam], Penicillins, Piroxicam, Sulfa antibiotics, and Sulfonamide derivatives   Review of Systems Review of Systems Per HPI  Physical Exam Triage Vital Signs ED Triage Vitals  Encounter Vitals Group     BP 03/08/23 1153 (!) 148/80     Systolic BP Percentile --      Diastolic BP Percentile --      Pulse Rate 03/08/23 1153 66     Resp 03/08/23 1153 16     Temp 03/08/23 1153 98.3 F (36.8 C)     Temp src --      SpO2 03/08/23 1153 94 %     Weight --      Height --      Head Circumference --      Peak Flow --      Pain Score 03/08/23 1154 8     Pain Loc --      Pain Education --      Exclude from Growth Chart --    No data found.  Updated Vital Signs BP (!) 148/80 (BP Location: Right Arm)   Pulse 66   Temp 98.3 F (36.8 C)   Resp 16   LMP  (LMP Unknown)   SpO2 94%   Visual Acuity Right Eye Distance:   Left Eye Distance:   Bilateral Distance:    Right Eye Near:   Left Eye Near:    Bilateral Near:      Physical Exam Vitals and nursing note reviewed.  Constitutional:      Appearance: She is not ill-appearing or toxic-appearing.  HENT:     Head: Normocephalic and atraumatic.     Right Ear: Hearing and external ear normal.     Left Ear: Hearing and external ear normal.     Nose: Nose normal.     Mouth/Throat:     Lips: Pink.  Eyes:     General: Lids are normal. Vision grossly intact. Gaze aligned appropriately.     Extraocular Movements: Extraocular movements intact.     Conjunctiva/sclera: Conjunctivae normal.  Cardiovascular:     Pulses:          Dorsalis pedis pulses are 2+ on the left side.  Pulmonary:     Effort: Pulmonary effort is normal.  Musculoskeletal:     Cervical back: Neck supple.       Feet:  Feet:     Left foot:     Skin integrity: Skin integrity normal.     Toenail Condition: Left toenails are normal.     Comments: No erythema, warmth, swelling, or signs of injury. Less than 3 cap refill to left distal lower extremity. Ambulatory with steady gait without limp or assistance. 5/5 strength against resistance with dorsiflexion and plantar flexion of the left lower extremity.  Skin:  General: Skin is warm and dry.     Capillary Refill: Capillary refill takes less than 2 seconds.     Findings: No rash.  Neurological:     General: No focal deficit present.     Mental Status: She is alert and oriented to person, place, and time. Mental status is at baseline.     Cranial Nerves: No dysarthria or facial asymmetry.  Psychiatric:        Mood and Affect: Mood normal.        Speech: Speech normal.        Behavior: Behavior normal.        Thought Content: Thought content normal.        Judgment: Judgment normal.      UC Treatments / Results  Labs (all labs ordered are listed, but only abnormal results are displayed) Labs Reviewed - No data to display  EKG   Radiology No results found.  Procedures Procedures (including critical care  time)  Medications Ordered in UC Medications - No data to display  Initial Impression / Assessment and Plan / UC Course  I have reviewed the triage vital signs and the nursing notes.  Pertinent labs & imaging results that were available during my care of the patient were reviewed by me and considered in my medical decision making (see chart for details).   1.  Tendinitis of left ankle Presentation suspicious for acute tendinitis of the left ankle. Patient likely has some arthritic changes (history of ulcerative areas) contributing to symptoms. She is not a candidate for NSAID use and symptoms have not responded well to Tylenol, therefore will initiate short burst of prednisone steroid. History of GERD that is well-controlled, however advised to take omeprazole, taking prednisone and take prednisone with food to avoid stomach upset.  Deferred imaging given atraumatic mechanism of injury/pain and low suspicion for acute bony abnormality.  Recommend follow-up with [facility] for ongoing evaluation.  Counseled patient on potential for adverse effects with medications prescribed/recommended today, strict ER and return-to-clinic precautions discussed, patient verbalized understanding.    Final Clinical Impressions(s) / UC Diagnoses   Final diagnoses:  Tendinitis of left ankle     Discharge Instructions      Take prednisone 40mg  once daily for the next 5 days with food to reduce inflammation to the left ankle/foot. Please schedule an appointment with [facility] for follow-up for further evaluation.  If you develop any new or worsening symptoms or if your symptoms do not start to improve, please return here or follow-up with your primary care provider. If your symptoms are severe, please go to the emergency room.    ED Prescriptions     Medication Sig Dispense Auth. Provider   predniSONE (DELTASONE) 20 MG tablet Take 2 tablets (40 mg total) by mouth  daily with breakfast for 5 days. 10 tablet Carlisle Beers, FNP      PDMP not reviewed this encounter.   Carlisle Beers, [place] 03/08/23 [phone]

## 2023-04-02 ENCOUNTER — Ambulatory Visit (INDEPENDENT_AMBULATORY_CARE_PROVIDER_SITE_OTHER): Payer: Medicare Other | Admitting: Family Medicine

## 2023-04-08 DIAGNOSIS — H35372 Puckering of macula, left eye: Secondary | ICD-10-CM | POA: Diagnosis not present

## 2023-04-08 DIAGNOSIS — H25813 Combined forms of age-related cataract, bilateral: Secondary | ICD-10-CM | POA: Diagnosis not present

## 2023-04-08 DIAGNOSIS — H04123 Dry eye syndrome of bilateral lacrimal glands: Secondary | ICD-10-CM | POA: Diagnosis not present

## 2023-04-08 DIAGNOSIS — H52223 Regular astigmatism, bilateral: Secondary | ICD-10-CM | POA: Diagnosis not present

## 2023-04-18 DIAGNOSIS — D2272 Melanocytic nevi of left lower limb, including hip: Secondary | ICD-10-CM | POA: Diagnosis not present

## 2023-04-18 DIAGNOSIS — D225 Melanocytic nevi of trunk: Secondary | ICD-10-CM | POA: Diagnosis not present

## 2023-04-18 DIAGNOSIS — L814 Other melanin hyperpigmentation: Secondary | ICD-10-CM | POA: Diagnosis not present

## 2023-04-18 DIAGNOSIS — L578 Other skin changes due to chronic exposure to nonionizing radiation: Secondary | ICD-10-CM | POA: Diagnosis not present

## 2023-04-18 DIAGNOSIS — D1801 Hemangioma of skin and subcutaneous tissue: Secondary | ICD-10-CM | POA: Diagnosis not present

## 2023-04-18 DIAGNOSIS — L821 Other seborrheic keratosis: Secondary | ICD-10-CM | POA: Diagnosis not present

## 2023-04-18 DIAGNOSIS — L820 Inflamed seborrheic keratosis: Secondary | ICD-10-CM | POA: Diagnosis not present

## 2023-04-30 ENCOUNTER — Ambulatory Visit (INDEPENDENT_AMBULATORY_CARE_PROVIDER_SITE_OTHER): Payer: Medicare Other | Admitting: Family Medicine

## 2023-04-30 ENCOUNTER — Encounter (INDEPENDENT_AMBULATORY_CARE_PROVIDER_SITE_OTHER): Payer: Self-pay | Admitting: Family Medicine

## 2023-04-30 VITALS — BP 130/69 | HR 62 | Temp 98.7°F | Ht 62.0 in | Wt 188.0 lb

## 2023-04-30 DIAGNOSIS — Z6834 Body mass index (BMI) 34.0-34.9, adult: Secondary | ICD-10-CM | POA: Diagnosis not present

## 2023-04-30 DIAGNOSIS — E559 Vitamin D deficiency, unspecified: Secondary | ICD-10-CM

## 2023-04-30 DIAGNOSIS — E669 Obesity, unspecified: Secondary | ICD-10-CM

## 2023-04-30 DIAGNOSIS — I10 Essential (primary) hypertension: Secondary | ICD-10-CM

## 2023-04-30 DIAGNOSIS — K5909 Other constipation: Secondary | ICD-10-CM

## 2023-04-30 DIAGNOSIS — R7303 Prediabetes: Secondary | ICD-10-CM | POA: Diagnosis not present

## 2023-04-30 MED ORDER — POLYETHYLENE GLYCOL 3350 17 G PO PACK: 17.0000 g | PACK | Freq: Every day | ORAL | 0 refills | Status: AC

## 2023-04-30 MED ORDER — VITAMIN D (ERGOCALCIFEROL) 1.25 MG (50000 UNIT) PO CAPS
50000.0000 [IU] | ORAL_CAPSULE | ORAL | 0 refills | Status: AC
Start: 2023-04-30 — End: ?

## 2023-04-30 NOTE — Progress Notes (Signed)
 Office: [phone]  /  Fax: [phone]  WEIGHT SUMMARY AND BIOMETRICS  Anthropometric Measurements Height: 5\' 2"  (1.575 m) Weight: 188 lb (85.3 kg) BMI (Calculated): 34.38 Weight at Last Visit: 186 lb Weight Lost Since Last Visit: 0 Weight Gained Since Last Visit: 2 lb Starting Weight: 208 lb Total Weight Loss (lbs): 20 lb (9.072 kg) Peak Weight: 250 lb   Body Composition  Body Fat %: 45.6 % Fat Mass (lbs): 85.8 lbs Muscle Mass (lbs): 97 lbs Total Body Water  (lbs): 78 lbs Visceral Fat Rating : 14   Other Clinical Data Fasting: no Labs: no Today's Visit #: 34 Starting Date: 08/25/18    Chief Complaint: OBESITY   History of Present Illness Jennifer [name] "Stephenie Einstein" is a 73 year old female with obesity and hypertension who presents for obesity treatment and progress assessment.  She follows her category one eating plan about fifty percent of the time and has not been exercising recently. She plans to start pool exercises next week. She has gained two pounds in the last two months. She describes herself as a 'mindless eater' and has been consuming too many sweets, particularly Easter candy, as well as overeating strawberries and peas.  She has a history of hypertension and is currently being treated with losartan . She is working on diet, exercise, and weight loss to manage her blood pressure. Her blood pressure today was initially 142/71 mmHg, but a repeat measurement stabilized at 130/69 mmHg.  She is concerned about her A1c levels, which were slightly elevated at 5.6. She acknowledges that her recent sugar intake may have contributed to this increase. She is motivated to incorporate more physical activity, including pool exercises twice a week.  She has a history of constipation, which is stable on Miralax , and vitamin D  deficiency, for which she is controlled on vitamin D  supplements.  She works part-time and has a supportive work Astronomer. She has a  sister and mentioned a family gathering in [place] . She is involved in church activities and has a small group at church.  No recent exercise but plans to start pool exercises next week. She has not used her CPAP for a week due to congestion from a sinus cold.      PHYSICAL EXAM:  Blood pressure 130/69, pulse 62, temperature 98.7 F (37.1 C), height 5\' 2"  (1.575 m), weight 188 lb (85.3 kg), SpO2 94%. Body mass index is 34.39 kg/m.  DIAGNOSTIC DATA REVIEWED:  BMET    Component Value Date/Time   NA 137 12/25/2022 1018   K 4.4 12/25/2022 1018   CL 98 12/25/2022 1018   CO2 20 12/25/2022 1018   GLUCOSE 80 12/25/2022 1018   GLUCOSE 77 10/14/2022 1422   BUN 20 12/25/2022 1018   CREATININE 0.70 12/25/2022 1018   CREATININE 0.72 09/02/2015 1207   CALCIUM  10.0 12/25/2022 1018   GFRNONAA 84 01/17/2020 1106   GFRNONAA 89 09/02/2015 1207   GFRAA 96 01/17/2020 1106   GFRAA >89 09/02/2015 1207   Lab Results  Component Value Date   HGBA1C 5.6 12/25/2022   HGBA1C 5.9 03/13/2016   Lab Results  Component Value Date   INSULIN  9.1 12/25/2022   INSULIN  14.7 08/25/2018   Lab Results  Component Value Date   TSH 2.410 12/25/2022   CBC    Component Value Date/Time   WBC 5.8 10/14/2022 1422   RBC 4.21 10/14/2022 1422   HGB 12.9 10/14/2022 1422   HGB 13.6 06/12/2022 1021   HCT 38.4 10/14/2022 1422  HCT 40.5 06/12/2022 1021   PLT 316.0 10/14/2022 1422   PLT 314 06/12/2022 1021   MCV 91.1 10/14/2022 1422   MCV 90 06/12/2022 1021   MCH 30.4 06/12/2022 1021   MCH 31.4 07/21/2018 0304   MCHC 33.7 10/14/2022 1422   RDW 12.8 10/14/2022 1422   RDW 11.9 06/12/2022 1021   Iron Studies    Component Value Date/Time   IRON 110 11/01/2019 0853   TIBC 382 11/01/2019 0853   IRONPCTSAT 29 11/01/2019 0853   Lipid Panel     Component Value Date/Time   CHOL 169 12/25/2022 1018   TRIG 139 12/25/2022 1018   HDL 57 12/25/2022 1018   CHOLHDL 3.8 12/25/2020 1230   CHOLHDL 5 05/04/2018  0805   VLDL 78.4 (H) 05/04/2018 0805   LDLCALC 88 12/25/2022 1018   LDLDIRECT 96.0 05/04/2018 0805   Hepatic Function Panel     Component Value Date/Time   PROT 7.4 12/25/2022 1018   ALBUMIN 4.7 12/25/2022 1018   AST 20 12/25/2022 1018   ALT 20 12/25/2022 1018   ALKPHOS 100 12/25/2022 1018   BILITOT 0.5 12/25/2022 1018      Component Value Date/Time   TSH 2.410 12/25/2022 1018   Nutritional Lab Results  Component Value Date   VD25OH 39.5 12/25/2022   VD25OH 40.8 06/12/2022   VD25OH 45.9 12/17/2021     Assessment and Plan Assessment & Plan Hypertension Blood pressure readings were 142/71 mmHg initially, stabilizing at 130/69 mmHg. She is currently on losartan . The goal is to manage hypertension through diet, exercise, and weight loss to potentially decrease medication use and prevent heart disease. - Continue losartan  - Encourage diet and exercise for blood pressure management  Prediabetes A1c has increased slightly to 5.6%. The goal is to prevent further increase through diet and exercise. She is aware of the need to manage sugar intake and increase physical activity. On days with increased activity, she can have extra strawberries and peas. - Encourage diet and exercise to manage blood sugar levels  Obesity She has gained 2 pounds in the last two months. She is following her eating plan about 50% of the time and plans to start pool exercises twice a week. The focus is on improving dietary habits and increasing physical activity to aid weight loss. She is aware of the need to manage portion sizes and avoid mindless eating, particularly with sweets and snacks. - Encourage adherence to the eating plan - Initiate pool exercises twice a week - Monitor weight and dietary habits  Vit D deficiency Stable -Refill vit D today     She was informed of the importance of frequent follow up visits to maximize her success with intensive lifestyle modifications for her multiple  health conditions.    Jasmine Mesi, MD

## 2023-05-19 ENCOUNTER — Encounter: Payer: Self-pay | Admitting: Internal Medicine

## 2023-05-19 ENCOUNTER — Other Ambulatory Visit: Payer: Self-pay | Admitting: Internal Medicine

## 2023-05-19 NOTE — Telephone Encounter (Signed)
 No I was having small bowel movements. The stool was small and sometimes hard to pass.

## 2023-05-19 NOTE — Telephone Encounter (Signed)
 Dr Willy Harvest would you like stool studies?

## 2023-05-21 ENCOUNTER — Encounter (INDEPENDENT_AMBULATORY_CARE_PROVIDER_SITE_OTHER): Payer: Self-pay | Admitting: Family Medicine

## 2023-05-28 ENCOUNTER — Telehealth (INDEPENDENT_AMBULATORY_CARE_PROVIDER_SITE_OTHER): Payer: Medicare Other | Admitting: Family Medicine

## 2023-05-28 ENCOUNTER — Encounter (INDEPENDENT_AMBULATORY_CARE_PROVIDER_SITE_OTHER): Payer: Self-pay | Admitting: Family Medicine

## 2023-05-28 VITALS — Ht 62.0 in | Wt 188.0 lb

## 2023-05-28 DIAGNOSIS — E669 Obesity, unspecified: Secondary | ICD-10-CM

## 2023-05-28 DIAGNOSIS — K5909 Other constipation: Secondary | ICD-10-CM

## 2023-05-28 DIAGNOSIS — Z6834 Body mass index (BMI) 34.0-34.9, adult: Secondary | ICD-10-CM

## 2023-05-28 DIAGNOSIS — E66812 Obesity, class 2: Secondary | ICD-10-CM

## 2023-05-28 NOTE — Progress Notes (Signed)
 Office: [phone]  /  Fax: [phone]  WEIGHT SUMMARY AND BIOMETRICS  Anthropometric Measurements Height: 5\' 2"  (1.575 m) Weight: 188 lb (85.3 kg) (last visit) BMI (Calculated): 34.38 Starting Weight: 208 lb Peak Weight: 250 lb   No data recorded Other Clinical Data Fasting: no Labs: no Today's Visit #: 35 Starting Date: 08/25/18 Comments: Video Visit    Chief Complaint: OBESITY   Discussed the use of AI scribe software for clinical note transcription with the patient, who gave verbal consent to proceed.  History of Present Illness   Jennifer [name] "Stephenie Einstein" is a 73 year old female who presents with gastrointestinal issues and obesity treatment. She consulted with Dr. Willy Harvest for her gastrointestinal issues.  She experiences alternating diarrhea and constipation. After consuming a smash burger, she developed watery, loose stools that persisted for a week, necessitating the use of Depends due to unpredictability. Despite following a regimen of Dulcolax and Miralax , she experienced no bowel movements for two days, followed by soft, formed stools. She continues to feel unwell, with a persistent ache in the upper left quadrant and a lack of appetite, eating minimally throughout the day.  She has not been exercising and feels fatigued, attributing some of this to poor sleep quality, as indicated by her CPAP machine. Her son advised her to reduce soda intake and increase water  consumption, which she has been attempting. She also tries to move around more, especially at work, which helps distract her from her symptoms.  She uses over-the-counter Miralax  and takes vitamin D  weekly. She is concerned about potential accidents if she increases her Miralax  intake. Her diet has been inconsistent due to her symptoms, with a focus on consuming eggs and sandwiches, and she has been incorporating strawberries into her diet. She avoids high-sugar fruits like bananas and grapes.           PHYSICAL EXAM:  Height 5\' 2"  (1.575 m), weight 188 lb (85.3 kg). Body mass index is 34.39 kg/m.  DIAGNOSTIC DATA REVIEWED:  BMET    Component Value Date/Time   NA 137 12/25/2022 1018   K 4.4 12/25/2022 1018   CL 98 12/25/2022 1018   CO2 20 12/25/2022 1018   GLUCOSE 80 12/25/2022 1018   GLUCOSE 77 10/14/2022 1422   BUN 20 12/25/2022 1018   CREATININE 0.70 12/25/2022 1018   CREATININE 0.72 09/02/2015 1207   CALCIUM  10.0 12/25/2022 1018   GFRNONAA 84 01/17/2020 1106   GFRNONAA 89 09/02/2015 1207   GFRAA 96 01/17/2020 1106   GFRAA >89 09/02/2015 1207   Lab Results  Component Value Date   HGBA1C 5.6 12/25/2022   HGBA1C 5.9 03/13/2016   Lab Results  Component Value Date   INSULIN  9.1 12/25/2022   INSULIN  14.7 08/25/2018   Lab Results  Component Value Date   TSH 2.410 12/25/2022   CBC    Component Value Date/Time   WBC 5.8 10/14/2022 1422   RBC 4.21 10/14/2022 1422   HGB 12.9 10/14/2022 1422   HGB 13.6 06/12/2022 1021   HCT 38.4 10/14/2022 1422   HCT 40.5 06/12/2022 1021   PLT 316.0 10/14/2022 1422   PLT 314 06/12/2022 1021   MCV 91.1 10/14/2022 1422   MCV 90 06/12/2022 1021   MCH 30.4 06/12/2022 1021   MCH 31.4 07/21/2018 0304   MCHC 33.7 10/14/2022 1422   RDW 12.8 10/14/2022 1422   RDW 11.9 06/12/2022 1021   Iron Studies    Component Value Date/Time   IRON 110 11/01/2019 0853  TIBC 382 11/01/2019 0853   IRONPCTSAT 29 11/01/2019 0853   Lipid Panel     Component Value Date/Time   CHOL 169 12/25/2022 1018   TRIG 139 12/25/2022 1018   HDL 57 12/25/2022 1018   CHOLHDL 3.8 12/25/2020 1230   CHOLHDL 5 05/04/2018 0805   VLDL 78.4 (H) 05/04/2018 0805   LDLCALC 88 12/25/2022 1018   LDLDIRECT 96.0 05/04/2018 0805   Hepatic Function Panel     Component Value Date/Time   PROT 7.4 12/25/2022 1018   ALBUMIN 4.7 12/25/2022 1018   AST 20 12/25/2022 1018   ALT 20 12/25/2022 1018   ALKPHOS 100 12/25/2022 1018   BILITOT 0.5 12/25/2022 1018       Component Value Date/Time   TSH 2.410 12/25/2022 1018   Nutritional Lab Results  Component Value Date   VD25OH 39.5 12/25/2022   VD25OH 40.8 06/12/2022   VD25OH 45.9 12/17/2021     Assessment and Plan    Chronic constipation Chronic constipation with alternating diarrhea and constipation, possibly due to a partial obstruction. Recent symptoms include watery loose stools followed by constipation. Miralax  is recommended to regulate bowel movements. Emphasized the importance of hydration and physical activity, particularly movements involving bending at the waist, to stimulate intestinal activity. Advised to avoid high sugar fruits like bananas, grapes, and dried fruits, which may irritate the gastrointestinal tract. Miralax  retains water  in the stool, making it softer, and is more effective at preventing constipation than treating it acutely. - Take Miralax  daily, starting with once a day for a week. - Increase water  intake. - Engage in physical activities that involve bending at the waist. - Avoid high sugar fruits such as bananas, grapes, and dried fruits. - Follow up with GI doctor after one week of Miralax  use.  Obesity Obesity management is ongoing. She reports maintaining her weight but has not consistently followed the category one eating plan due to gastrointestinal issues. She is not currently exercising. Plan to resume the category one eating plan and increase physical activity as gastrointestinal symptoms improve. - Resume category one eating plan. - Increase physical activity as gastrointestinal symptoms improve.         I have personally spent 25 minutes total time today in preparation, patient care, and documentation for this visit, including the following: review of clinical lab tests; review of medical history, and nutritional counseling   She was informed of the importance of frequent follow up visits to maximize her success with intensive lifestyle modifications for  her multiple health conditions.    Jasmine Mesi, MD

## 2023-06-15 ENCOUNTER — Other Ambulatory Visit: Payer: Self-pay | Admitting: Family Medicine

## 2023-06-15 DIAGNOSIS — E7849 Other hyperlipidemia: Secondary | ICD-10-CM

## 2023-06-15 DIAGNOSIS — F329 Major depressive disorder, single episode, unspecified: Secondary | ICD-10-CM

## 2023-06-25 ENCOUNTER — Encounter (INDEPENDENT_AMBULATORY_CARE_PROVIDER_SITE_OTHER): Payer: Self-pay | Admitting: Family Medicine

## 2023-06-25 ENCOUNTER — Ambulatory Visit (INDEPENDENT_AMBULATORY_CARE_PROVIDER_SITE_OTHER): Payer: Medicare Other | Admitting: Family Medicine

## 2023-06-25 VITALS — BP 131/77 | HR 65 | Temp 98.2°F | Ht 62.0 in | Wt 184.0 lb

## 2023-06-25 DIAGNOSIS — Z6834 Body mass index (BMI) 34.0-34.9, adult: Secondary | ICD-10-CM

## 2023-06-25 DIAGNOSIS — E66812 Obesity, class 2: Secondary | ICD-10-CM

## 2023-06-25 DIAGNOSIS — E782 Mixed hyperlipidemia: Secondary | ICD-10-CM

## 2023-06-25 DIAGNOSIS — E669 Obesity, unspecified: Secondary | ICD-10-CM | POA: Diagnosis not present

## 2023-06-25 DIAGNOSIS — E559 Vitamin D deficiency, unspecified: Secondary | ICD-10-CM | POA: Diagnosis not present

## 2023-06-25 DIAGNOSIS — E039 Hypothyroidism, unspecified: Secondary | ICD-10-CM

## 2023-06-25 DIAGNOSIS — K5900 Constipation, unspecified: Secondary | ICD-10-CM

## 2023-06-25 DIAGNOSIS — I10 Essential (primary) hypertension: Secondary | ICD-10-CM

## 2023-06-25 DIAGNOSIS — E88819 Insulin resistance, unspecified: Secondary | ICD-10-CM

## 2023-06-25 DIAGNOSIS — Z6833 Body mass index (BMI) 33.0-33.9, adult: Secondary | ICD-10-CM

## 2023-06-25 DIAGNOSIS — E785 Hyperlipidemia, unspecified: Secondary | ICD-10-CM

## 2023-06-25 DIAGNOSIS — K5909 Other constipation: Secondary | ICD-10-CM

## 2023-06-25 MED ORDER — VITAMIN D (ERGOCALCIFEROL) 1.25 MG (50000 UNIT) PO CAPS
50000.0000 [IU] | ORAL_CAPSULE | ORAL | 0 refills | Status: DC
Start: 2023-06-25 — End: 2023-09-22

## 2023-06-25 NOTE — Progress Notes (Signed)
 Office: [phone]  /  Fax: [phone]  WEIGHT SUMMARY AND BIOMETRICS  Anthropometric Measurements Height: 5' 2 (1.575 m) Weight: 184 lb (83.5 kg) BMI (Calculated): 33.65 Weight at Last Visit: 188 lb Weight Lost Since Last Visit: 4 lb Weight Gained Since Last Visit: 0 Starting Weight: 208 lb Total Weight Loss (lbs): 24 lb (10.9 kg) Peak Weight: 250 lb   Body Composition  Body Fat %: 38.7 % Fat Mass (lbs): 71.6 lbs Muscle Mass (lbs): 107.4 lbs Total Body Water  (lbs): 75.4 lbs Visceral Fat Rating : 12   Other Clinical Data Fasting: yes Labs: no Today's Visit #: 36 Starting Date: 08/25/18    Chief Complaint: OBESITY   History of Present Illness Jennifer [name] is a 73 year old female who presents for obesity treatment.  She has been following the category one eating plan about fifty percent of the time and has lost four pounds in the last month. She is not currently exercising but is focusing on diet, exercise, and weight loss to help control her blood pressure. She is on losartan  25 mg per day for blood pressure management.  She experiences constipation, describing it as infrequent with hard, painful bowel movements. She feels 'totally full' before having a bowel movement and uses generic Miralax  to manage this condition. Eating greasy foods during a recent visit with her sister seemed to help alleviate the constipation.  She has a history of hyperlipidemia and is on Zetia . Her last cholesterol levels were improved. She is also working on diet and exercise to manage this condition.  She has a history of insulin  resistance and is working on decreasing simple carbohydrates in her diet.  She has hypothyroidism and is currently taking 50 mcg of Synthroid .      PHYSICAL EXAM:  Blood pressure 131/77, pulse 65, temperature 98.2 F (36.8 C), height 5' 2 (1.575 m), weight 184 lb (83.5 kg), SpO2 93%. Body mass index is 33.65 kg/m.  DIAGNOSTIC DATA  REVIEWED:  BMET    Component Value Date/Time   NA 137 12/25/2022 1018   K 4.4 12/25/2022 1018   CL 98 12/25/2022 1018   CO2 20 12/25/2022 1018   GLUCOSE 80 12/25/2022 1018   GLUCOSE 77 10/14/2022 1422   BUN 20 12/25/2022 1018   CREATININE 0.70 12/25/2022 1018   CREATININE 0.72 09/02/2015 1207   CALCIUM  10.0 12/25/2022 1018   GFRNONAA 84 01/17/2020 1106   GFRNONAA 89 09/02/2015 1207   GFRAA 96 01/17/2020 1106   GFRAA >89 09/02/2015 1207   Lab Results  Component Value Date   HGBA1C 5.6 12/25/2022   HGBA1C 5.9 03/13/2016   Lab Results  Component Value Date   INSULIN  9.1 12/25/2022   INSULIN  14.7 08/25/2018   Lab Results  Component Value Date   TSH 2.410 12/25/2022   CBC    Component Value Date/Time   WBC 5.8 10/14/2022 1422   RBC 4.21 10/14/2022 1422   HGB 12.9 10/14/2022 1422   HGB 13.6 06/12/2022 1021   HCT 38.4 10/14/2022 1422   HCT 40.5 06/12/2022 1021   PLT 316.0 10/14/2022 1422   PLT 314 06/12/2022 1021   MCV 91.1 10/14/2022 1422   MCV 90 06/12/2022 1021   MCH 30.4 06/12/2022 1021   MCH 31.4 07/21/2018 0304   MCHC 33.7 10/14/2022 1422   RDW 12.8 10/14/2022 1422   RDW 11.9 06/12/2022 1021   Iron Studies    Component Value Date/Time   IRON 110 11/01/2019 0853   TIBC  382 11/01/2019 0853   IRONPCTSAT 29 11/01/2019 0853   Lipid Panel     Component Value Date/Time   CHOL 169 12/25/2022 1018   TRIG 139 12/25/2022 1018   HDL 57 12/25/2022 1018   CHOLHDL 3.8 12/25/2020 1230   CHOLHDL 5 05/04/2018 0805   VLDL 78.4 (H) 05/04/2018 0805   LDLCALC 88 12/25/2022 1018   LDLDIRECT 96.0 05/04/2018 0805   Hepatic Function Panel     Component Value Date/Time   PROT 7.4 12/25/2022 1018   ALBUMIN 4.7 12/25/2022 1018   AST 20 12/25/2022 1018   ALT 20 12/25/2022 1018   ALKPHOS 100 12/25/2022 1018   BILITOT 0.5 12/25/2022 1018      Component Value Date/Time   TSH 2.410 12/25/2022 1018   Nutritional Lab Results  Component Value Date   VD25OH 39.5  12/25/2022   VD25OH 40.8 06/12/2022   VD25OH 45.9 12/17/2021     Assessment and Plan Assessment & Plan Hypertension Blood pressure is well-controlled at 130/77 mmHg with losartan  25 mg daily. Emphasized the importance of lifestyle modifications, including diet, exercise, and weight loss, in conjunction with medication for effective management. - Continue losartan  25 mg daily - Continue diet, exercise and weight loss  Obesity Following the category one eating plan 50% of the time, resulting in a four-pound weight loss over the last month. Currently not exercising. Discussed the importance of regular exercise and dietary adherence to enhance weight loss and aid in blood pressure control. - Continue category one eating plan - Encourage regular exercise - Monitor weight loss progress  Hyperlipidemia On Zetia  with significantly improved cholesterol levels. Plans to monitor cholesterol levels through lab work to ensure continued management. - Order lab work to check cholesterol levels - Continue Zetia  - Continue diet, exercise and weight loss  Insulin  Resistance Working on reducing simple carbohydrates in her diet. Plans to monitor glucose control by checking hemoglobin A1c through lab work. - Order lab work to check hemoglobin A1c - Continue dietary modifications to reduce simple carbohydrates  Constipation Experiencing constipation with infrequent and hard bowel movements, possibly related to her eating plan and weight loss efforts. Explained the role of hydration and fiber intake in managing this condition and discussed the safety and non-addictive nature of Miralax . - Increase hydration - Incorporate high-fiber foods, especially beans, into diet - Continue using Miralax  as needed  Hypothyroidism On Synthroid  50 mcg. Plans to monitor thyroid  function through lab work to ensure well-controlled status. - Order lab work to check thyroid  function - Continue Synthroid  50 mcg  Vitamin  D Deficiency Plans to monitor vitamin D  levels through lab work to ensure adequate supplementation. - Order lab work to check vitamin D  levels - Refill vitamin D  prescription  Follow-up Scheduled for follow-up in four weeks to review lab results and assess progress on treatment plans. - Schedule follow-up appointment in four weeks     I have personally spent 42 minutes total time today in preparation, patient care, and documentation for this visit, including the following: review of clinical lab tests; review of medical history, and nutritional counseling   She was informed of the importance of frequent follow up visits to maximize her success with intensive lifestyle modifications for her multiple health conditions.    Jasmine Mesi, MD

## 2023-06-26 LAB — LIPID PANEL WITH LDL/HDL RATIO
Cholesterol, Total: 192 mg/dL (ref 100–199)
HDL: 51 mg/dL (ref >39)
LDL Chol Calc (NIH): 101 mg/dL — ABNORMAL HIGH (ref 0–99)
LDL/HDL Ratio: 2 ratio (ref 0.0–3.2)
Triglycerides: 235 mg/dL — ABNORMAL HIGH (ref 0–149)
VLDL Cholesterol Cal: 40 mg/dL (ref 5–40)

## 2023-06-26 LAB — CBC WITH DIFFERENTIAL/PLATELET
Basophils Absolute: 0 10*3/uL (ref 0.0–0.2)
Basos: 1 %
EOS (ABSOLUTE): 0.1 10*3/uL (ref 0.0–0.4)
Eos: 2 %
Hematocrit: 40.4 % (ref 34.0–46.6)
Hemoglobin: 13 g/dL (ref 11.1–15.9)
Immature Grans (Abs): 0 10*3/uL (ref 0.0–0.1)
Immature Granulocytes: 0 %
Lymphocytes Absolute: 0.9 10*3/uL (ref 0.7–3.1)
Lymphs: 16 %
MCH: 30 pg (ref 26.6–33.0)
MCHC: 32.2 g/dL (ref 31.5–35.7)
MCV: 93 fL (ref 79–97)
Monocytes Absolute: 0.4 10*3/uL (ref 0.1–0.9)
Monocytes: 8 %
Neutrophils Absolute: 4.1 10*3/uL (ref 1.4–7.0)
Neutrophils: 73 %
Platelets: 297 10*3/uL (ref 150–450)
RBC: 4.33 x10E6/uL (ref 3.77–5.28)
RDW: 12.2 % (ref 11.7–15.4)
WBC: 5.5 10*3/uL (ref 3.4–10.8)

## 2023-06-26 LAB — CMP14+EGFR
ALT: 17 IU/L (ref 0–32)
AST: 19 IU/L (ref 0–40)
Albumin: 4.6 g/dL (ref 3.8–4.8)
Alkaline Phosphatase: 97 IU/L (ref 44–121)
BUN/Creatinine Ratio: 34 — ABNORMAL HIGH (ref 12–28)
BUN: 22 mg/dL (ref 8–27)
Bilirubin Total: 0.5 mg/dL (ref 0.0–1.2)
CO2: 20 mmol/L (ref 20–29)
Calcium: 10.2 mg/dL (ref 8.7–10.3)
Chloride: 101 mmol/L (ref 96–106)
Creatinine, Ser: 0.65 mg/dL (ref 0.57–1.00)
Globulin, Total: 2.8 g/dL (ref 1.5–4.5)
Glucose: 78 mg/dL (ref 70–99)
Potassium: 4.4 mmol/L (ref 3.5–5.2)
Sodium: 139 mmol/L (ref 134–144)
Total Protein: 7.4 g/dL (ref 6.0–8.5)
eGFR: 93 mL/min/{1.73_m2} (ref >59)

## 2023-06-26 LAB — TSH: TSH: 3.02 u[IU]/mL (ref 0.450–4.500)

## 2023-06-26 LAB — INSULIN, RANDOM: INSULIN: 9.5 u[IU]/mL (ref 2.6–24.9)

## 2023-06-26 LAB — VITAMIN B12: Vitamin B-12: 769 pg/mL (ref 232–1245)

## 2023-06-26 LAB — VITAMIN D 25 HYDROXY (VIT D DEFICIENCY, FRACTURES): Vit D, 25-Hydroxy: 62.6 ng/mL (ref 30.0–100.0)

## 2023-07-23 ENCOUNTER — Ambulatory Visit (INDEPENDENT_AMBULATORY_CARE_PROVIDER_SITE_OTHER): Admitting: Family Medicine

## 2023-07-23 ENCOUNTER — Encounter (INDEPENDENT_AMBULATORY_CARE_PROVIDER_SITE_OTHER): Payer: Self-pay | Admitting: Family Medicine

## 2023-07-23 VITALS — BP 116/72 | HR 70 | Temp 98.3°F | Ht 62.0 in | Wt 186.0 lb

## 2023-07-23 DIAGNOSIS — Z6834 Body mass index (BMI) 34.0-34.9, adult: Secondary | ICD-10-CM | POA: Diagnosis not present

## 2023-07-23 DIAGNOSIS — R7303 Prediabetes: Secondary | ICD-10-CM | POA: Diagnosis not present

## 2023-07-23 DIAGNOSIS — E782 Mixed hyperlipidemia: Secondary | ICD-10-CM

## 2023-07-23 DIAGNOSIS — E66812 Obesity, class 2: Secondary | ICD-10-CM

## 2023-07-23 DIAGNOSIS — E88819 Insulin resistance, unspecified: Secondary | ICD-10-CM | POA: Diagnosis not present

## 2023-07-23 DIAGNOSIS — E669 Obesity, unspecified: Secondary | ICD-10-CM | POA: Diagnosis not present

## 2023-07-23 DIAGNOSIS — E559 Vitamin D deficiency, unspecified: Secondary | ICD-10-CM

## 2023-07-23 DIAGNOSIS — E860 Dehydration: Secondary | ICD-10-CM | POA: Diagnosis not present

## 2023-07-23 DIAGNOSIS — E781 Pure hyperglyceridemia: Secondary | ICD-10-CM | POA: Diagnosis not present

## 2023-07-23 DIAGNOSIS — R21 Rash and other nonspecific skin eruption: Secondary | ICD-10-CM | POA: Diagnosis not present

## 2023-07-23 NOTE — Progress Notes (Signed)
 Office: [phone]  /  Fax: [phone]  WEIGHT SUMMARY AND BIOMETRICS  Anthropometric Measurements Height: 5' 2 (1.575 m) Weight: 186 lb (84.4 kg) BMI (Calculated): 34.01 Weight at Last Visit: 184 lb Weight Lost Since Last Visit: 0 Weight Gained Since Last Visit: 2 lb Starting Weight: 208 lb Total Weight Loss (lbs): 22 lb (9.979 kg) Peak Weight: 250 lb   Body Composition  Body Fat %: 39 % Fat Mass (lbs): 72.6 lbs Muscle Mass (lbs): 108 lbs Total Body Water  (lbs): 77.8 lbs Visceral Fat Rating : 12   Other Clinical Data Fasting: yes Labs: no Today's Visit #: 37 Starting Date: 08/25/18 Comments: cat 1    Chief Complaint: OBESITY    History of Present Illness Jennifer [name] is a 73 year old female who presents for obesity treatment management.  She is following the category one eating plan about fifty percent of the time and has not been engaging in any exercise. Over the past month, she has gained two pounds, attributing part of her weight gain to a stressful month, including the disappearance of her indoor-outdoor cat with early renal disease and a trip to [place], where she indulged in food during the Fourth of July celebrations.  She is concerned about her recent blood test results, particularly her elevated triglycerides despite fasting and being on Zetia  and a statin. She questions the high triglyceride levels as she does not consume excessive amounts of steak or high-fat foods but acknowledges being a 'carb eater', which she wonders might be affecting her triglyceride levels.  She discusses her hydration status, noting a BUN level of 22, and inquires whether soda can count towards hydration. She inquires whether soda can count towards hydration.  She mentions a rash on her left side, which she associates with sun exposure. The rash does not itch and has changed shape since it appeared. She has a history of being 'a rashy person' with many  allergies.  Her social history includes a recent trip to [place] with her sister, during which she experienced some interpersonal stress. She describes her sister as having strong opinions and sometimes making hurtful comments, which she addressed during the trip. She has a history of being a 'diet [employer] freak' and a 'carb eater'. She has a dog and a cat, the latter of which has gone missing.  She reports feeling tired and having a stressful month. No itching associated with her rash.      PHYSICAL EXAM:  Blood pressure 116/72, pulse 70, temperature 98.3 F (36.8 C), height 5' 2 (1.575 m), weight 186 lb (84.4 kg), SpO2 94%. Body mass index is 34.02 kg/m.  DIAGNOSTIC DATA REVIEWED:  BMET    Component Value Date/Time   NA 139 06/25/2023 0945   K 4.4 06/25/2023 0945   CL 101 06/25/2023 0945   CO2 20 06/25/2023 0945   GLUCOSE 78 06/25/2023 0945   GLUCOSE 77 10/14/2022 1422   BUN 22 06/25/2023 0945   CREATININE 0.65 06/25/2023 0945   CREATININE 0.72 09/02/2015 1207   CALCIUM  10.2 06/25/2023 0945   GFRNONAA 84 01/17/2020 1106   GFRNONAA 89 09/02/2015 1207   GFRAA 96 01/17/2020 1106   GFRAA >89 09/02/2015 1207   Lab Results  Component Value Date   HGBA1C 5.6 12/25/2022   HGBA1C 5.9 03/13/2016   Lab Results  Component Value Date   INSULIN  9.5 06/25/2023   INSULIN  14.7 08/25/2018   Lab Results  Component Value Date   TSH 3.020 06/25/2023  CBC    Component Value Date/Time   WBC 5.5 06/25/2023 0945   WBC 5.8 10/14/2022 1422   RBC 4.33 06/25/2023 0945   RBC 4.21 10/14/2022 1422   HGB 13.0 06/25/2023 0945   HCT 40.4 06/25/2023 0945   PLT 297 06/25/2023 0945   MCV 93 06/25/2023 0945   MCH 30.0 06/25/2023 0945   MCH 31.4 07/21/2018 0304   MCHC 32.2 06/25/2023 0945   MCHC 33.7 10/14/2022 1422   RDW 12.2 06/25/2023 0945   Iron Studies    Component Value Date/Time   IRON 110 11/01/2019 0853   TIBC 382 11/01/2019 0853   IRONPCTSAT 29 11/01/2019 0853   Lipid  Panel     Component Value Date/Time   CHOL 192 06/25/2023 0945   TRIG 235 (H) 06/25/2023 0945   HDL 51 06/25/2023 0945   CHOLHDL 3.8 12/25/2020 1230   CHOLHDL 5 05/04/2018 0805   VLDL 78.4 (H) 05/04/2018 0805   LDLCALC 101 (H) 06/25/2023 0945   LDLDIRECT 96.0 05/04/2018 0805   Hepatic Function Panel     Component Value Date/Time   PROT 7.4 06/25/2023 0945   ALBUMIN 4.6 06/25/2023 0945   AST 19 06/25/2023 0945   ALT 17 06/25/2023 0945   ALKPHOS 97 06/25/2023 0945   BILITOT 0.5 06/25/2023 0945      Component Value Date/Time   TSH 3.020 06/25/2023 0945   Nutritional Lab Results  Component Value Date   VD25OH 62.6 06/25/2023   VD25OH 39.5 12/25/2022   VD25OH 40.8 06/12/2022     Assessment and Plan Assessment & Plan Rash (Hives) She presented with a rash on her left side, identified as hives, which has changed shape, suggesting an allergic reaction or exposure to an allergen. She has experienced similar episodes before. - Apply hydrocortisone or diphenhydramine  to the affected area and follow up with PCP if symptoms fail to resolve  Obesity She is following the category one eating plan about 50% of the time and is not currently exercising. She has gained two pounds in the last month. Emphasized the importance of adhering to the eating plan and discussed the challenges of incorporating more cooking into her diet. She prefers simple meals and acknowledged the need to follow the plan more closely. - Encourage adherence to the category one eating plan.  Hypertriglyceridemia Her triglycerides are elevated despite fasting and being on ezetimibe  and a statin. Explained that triglycerides are more affected by recent carbohydrate intake and alcohol  consumption. She acknowledged being a carbohydrate eater and was advised that her triglycerides might decrease with dietary changes. - Reinforce dietary modifications to reduce carbohydrate intake. - Continue current medications  (ezetimibe  and statin).  Dehydration Her BUN is slightly elevated at 22, indicating mild dehydration. Explained the importance of hydration and clarified that caffeinated beverages do not count towards hydration due to their diuretic effect. Recommended switching to caffeine-free beverages to improve hydration status. - Increase water  intake. - Consider switching to caffeine-free beverages.  Insulin  Resistance and Prediabetes She is due for an A1c test, which was not performed during her last blood work. - Order A1c test. -Continue working on diet, exercise and weight loss  Follow-up 4w     She was informed of the importance of frequent follow up visits to maximize her success with intensive lifestyle modifications for her multiple health conditions.    Louann Penton, MD

## 2023-07-24 LAB — HEMOGLOBIN A1C
Est. average glucose Bld gHb Est-mCnc: 105 mg/dL
Hgb A1c MFr Bld: 5.3 % (ref 4.8–5.6)

## 2023-08-04 ENCOUNTER — Encounter: Payer: Self-pay | Admitting: Family Medicine

## 2023-08-05 ENCOUNTER — Telehealth: Payer: Self-pay

## 2023-08-05 NOTE — Telephone Encounter (Signed)
 See MyChart message, I would like her to be seen in the next few days.  If I have any openings, that would be ideal or with Dr. Jerrell if needed.  Thank you.

## 2023-08-05 NOTE — Telephone Encounter (Signed)
 Department of transportation form is signed and has been placed at the front for pick up. Pt. VM was full. Mychart message sent.

## 2023-08-08 ENCOUNTER — Ambulatory Visit (INDEPENDENT_AMBULATORY_CARE_PROVIDER_SITE_OTHER): Admitting: Student in an Organized Health Care Education/Training Program

## 2023-08-08 ENCOUNTER — Encounter: Payer: Self-pay | Admitting: Student in an Organized Health Care Education/Training Program

## 2023-08-08 VITALS — BP 124/70 | HR 60 | Wt 186.0 lb

## 2023-08-08 DIAGNOSIS — L570 Actinic keratosis: Secondary | ICD-10-CM | POA: Insufficient documentation

## 2023-08-08 DIAGNOSIS — L814 Other melanin hyperpigmentation: Secondary | ICD-10-CM | POA: Insufficient documentation

## 2023-08-08 DIAGNOSIS — D2370 Other benign neoplasm of skin of unspecified lower limb, including hip: Secondary | ICD-10-CM | POA: Insufficient documentation

## 2023-08-08 DIAGNOSIS — D1801 Hemangioma of skin and subcutaneous tissue: Secondary | ICD-10-CM | POA: Insufficient documentation

## 2023-08-08 DIAGNOSIS — M7989 Other specified soft tissue disorders: Secondary | ICD-10-CM | POA: Diagnosis not present

## 2023-08-08 DIAGNOSIS — D225 Melanocytic nevi of trunk: Secondary | ICD-10-CM | POA: Insufficient documentation

## 2023-08-08 NOTE — Progress Notes (Signed)
   Acute Office Visit  Subjective:     Patient ID: CLOMA RAHRIG, female    DOB: [birth year], 73 y.o.   MRN: [id number]  Chief Complaint  Patient presents with   Foot Swelling    Ankle swelling started on July 14th and stayed swollen. On July 20th and patient was walking around still a little swelling all on right side. Rash that went from ankle to knee. Patient states at night the swelling would go down a little. Patients foot is no longer swelling.     HPI  Discussed the use of AI scribe software for clinical note transcription with the patient, who gave verbal consent to proceed.  History of Present Illness Shayma Pfefferle is a 73 year old female who presents with right ankle swelling.  She experienced right ankle swelling that began on July 14th after a trip to [place] with her sister, where she engaged in extensive walking in extremely hot weather. The swelling was significant upon returning to the hotel room but resolved spontaneously. On July 20th, during a trip to [place] for her great nephew's golf tournament, the swelling recurred, accompanied by blotchy, non-itchy hives on the right ankle, which she notes were not a sunburn. The swelling subsided over time, and her ankle appeared normal on the morning of the visit. No pain, itching, or other symptoms associated with the swelling.  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 She also has a total hip replacement. No history of blood clots, recent illnesses, surgeries, or hospitalizations. She has varicose veins. She is not currently using compression stockings.  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 No recent flights or travel that could be related to the swelling, with her last flight being in June, a month  before the swelling began.      Objective:    BP 124/70   Pulse 60   Wt 186 lb (84.4 kg)   LMP  (LMP Unknown)   BMI 34.02 kg/m    Physical Exam  Gen: Well-appearing Heart: Regular, no murmur Lungs: Unlabored, clear throughout, no wheezing or crackles Ext: Warm, no edema in either extremity.  She has well-healed surgical scars in both knees.  Surgical scar on her right leg is quite extensive.  Both legs have moderate varicose veins, a little worse in the right side. Skin: No rash or stasis dermatitis     Assessment & Plan:   Problem List Items Addressed This Visit       Unprioritized   Right leg swelling - Primary   Intermittent right lower extremity swelling, resolved today.  I think the etiology based on history and exam today is most likely from venous insufficiency and prior right knee replacement surgery.  I think the risk of a DVT in the right leg is a low.  She has no risk factors for DVT, and no history of hypercoagulable state.  We decided to do a D-dimer today, if it is elevated we will do a venous ultrasound.  We talked about treatment of venous insufficiency including compression stockings, ambulation on a daily basis, and elevation while resting.  No signs of arterial disease on exam.      Relevant Orders   D-Dimer, Quantitative    Cleatus Debby Specking, MD

## 2023-08-08 NOTE — Assessment & Plan Note (Signed)
 Intermittent right lower extremity swelling, resolved today.  I think the etiology based on history and exam today is most likely from venous insufficiency and prior right knee replacement surgery.  I think the risk of a DVT in the right leg is a low.  She has no risk factors for DVT, and no history of hypercoagulable state.  We decided to do a D-dimer today, if it is elevated we will do a venous ultrasound.  We talked about treatment of venous insufficiency including compression stockings, ambulation on a daily basis, and elevation while resting.  No signs of arterial disease on exam.

## 2023-08-08 NOTE — Patient Instructions (Signed)
  VISIT SUMMARY: Jennifer [name], a 73 year old female, visited due to right ankle swelling that began after extensive walking in hot weather. The swelling has been intermittent and resolves on its own. She has a history of knee and hip replacements and varicose veins but no history of blood clots or recent surgeries. She remains active and works part-time.  YOUR PLAN: -RIGHT LOWER EXTREMITY SWELLING: You have experienced intermittent swelling in your right ankle since July 14th, likely due to heat exposure and prolonged walking. This type of swelling is not uncommon and usually resolves on its own. To rule out any serious conditions like deep vein thrombosis (DVT), we will perform a D-dimer test. If the test results are elevated, we will arrange for an ultrasound of your right lower extremity.  -CHRONIC VENOUS INSUFFICIENCY OF LOWER EXTREMITIES: Chronic venous insufficiency means that the veins in your legs have trouble sending blood back to your heart, which can cause swelling. Your varicose veins and past knee surgery may make this condition worse. To help manage this, you should wear compression stockings when needed, especially during episodes of swelling. Regular walking and elevating your legs can also help reduce the swelling.  INSTRUCTIONS: Please follow up for the D-dimer test as discussed. If the results are elevated, we will schedule an ultrasound for your right lower extremity. Continue to wear compression stockings as needed and practice regular ambulation and leg elevation to manage the swelling.

## 2023-08-08 NOTE — Telephone Encounter (Signed)
 Patient being seen by Dr.Vincent today 08/08/2023

## 2023-08-08 NOTE — Telephone Encounter (Signed)
 Patient has an appointment today with Dr.Vincent.

## 2023-08-09 LAB — D-DIMER, QUANTITATIVE: D-Dimer, Quant: 0.73 ug{FEU}/mL — ABNORMAL HIGH (ref <0.50)

## 2023-08-11 ENCOUNTER — Ambulatory Visit (HOSPITAL_COMMUNITY)
Admission: RE | Admit: 2023-08-11 | Discharge: 2023-08-11 | Disposition: A | Discharge status: Home / Self Care | Source: Ambulatory Visit | Attending: Student in an Organized Health Care Education/Training Program | Admitting: Student in an Organized Health Care Education/Training Program

## 2023-08-11 ENCOUNTER — Ambulatory Visit: Payer: Self-pay | Admitting: Student in an Organized Health Care Education/Training Program

## 2023-08-11 ENCOUNTER — Telehealth: Payer: Self-pay

## 2023-08-11 ENCOUNTER — Telehealth: Payer: Self-pay | Admitting: Neurology

## 2023-08-11 DIAGNOSIS — M7989 Other specified soft tissue disorders: Secondary | ICD-10-CM | POA: Insufficient documentation

## 2023-08-11 DIAGNOSIS — Z0279 Encounter for issue of other medical certificate: Secondary | ICD-10-CM

## 2023-08-11 NOTE — Telephone Encounter (Signed)
 Patient is needing a PA  Copied from CRM [id number]. Topic: General - Other >> Aug 11, 2023  1:29 PM Rosina BIRCH wrote: Reason for CRM: amanda from [facility] called stating they were sent and order for a vascular test for lower extremity for the patient and she is scheduled for today at 2. The patient need pre authorization for visit CB [phone]

## 2023-08-11 NOTE — Telephone Encounter (Signed)
 Pt came in to office to drop off DMV forms to be filled out by Dr. Buck. Pt requesting a call to pick them up after they have been completed, paid $50 fee.

## 2023-08-11 NOTE — Telephone Encounter (Signed)
 Patient is messaging back about results and is also wondering if she is suppose to wear compression socks on both legs or just one?

## 2023-08-12 DIAGNOSIS — Z0289 Encounter for other administrative examinations: Secondary | ICD-10-CM

## 2023-08-18 NOTE — Telephone Encounter (Addendum)
 Form completed on respiratory part as previously done on DMV form.  Completed/ Signed.  To medical records.

## 2023-08-19 ENCOUNTER — Telehealth: Payer: Self-pay | Admitting: *Deleted

## 2023-08-19 NOTE — Telephone Encounter (Signed)
 I called pt lvm not able to reach pt. Pt dmv form @ the front desk.

## 2023-08-20 ENCOUNTER — Encounter (INDEPENDENT_AMBULATORY_CARE_PROVIDER_SITE_OTHER): Payer: Self-pay | Admitting: Family Medicine

## 2023-08-20 ENCOUNTER — Ambulatory Visit (INDEPENDENT_AMBULATORY_CARE_PROVIDER_SITE_OTHER): Admitting: Family Medicine

## 2023-08-20 VITALS — BP 115/72 | HR 69 | Temp 98.2°F | Ht 62.0 in | Wt 186.0 lb

## 2023-08-20 DIAGNOSIS — Z6834 Body mass index (BMI) 34.0-34.9, adult: Secondary | ICD-10-CM | POA: Diagnosis not present

## 2023-08-20 DIAGNOSIS — I10 Essential (primary) hypertension: Secondary | ICD-10-CM

## 2023-08-20 DIAGNOSIS — E66812 Obesity, class 2: Secondary | ICD-10-CM

## 2023-08-20 DIAGNOSIS — E669 Obesity, unspecified: Secondary | ICD-10-CM | POA: Diagnosis not present

## 2023-08-20 DIAGNOSIS — E785 Hyperlipidemia, unspecified: Secondary | ICD-10-CM

## 2023-08-20 DIAGNOSIS — E782 Mixed hyperlipidemia: Secondary | ICD-10-CM

## 2023-08-20 DIAGNOSIS — R5383 Other fatigue: Secondary | ICD-10-CM

## 2023-08-20 NOTE — Progress Notes (Signed)
 Office: [phone]  /  Fax: [phone]  WEIGHT SUMMARY AND BIOMETRICS  Anthropometric Measurements Height: 5' 2 (1.575 m) Weight: 186 lb (84.4 kg) BMI (Calculated): 34.01 Weight at Last Visit: 186 lb Weight Lost Since Last Visit: 0 Weight Gained Since Last Visit: 0 Starting Weight: 208 lb Total Weight Loss (lbs): 22 lb (9.979 kg) Peak Weight: 250 lb   Body Composition  Body Fat %: 44.6 % Fat Mass (lbs): 83.4 lbs Muscle Mass (lbs): 98.2 lbs Total Body Water  (lbs): 76.2 lbs Visceral Fat Rating : 14   Other Clinical Data Fasting: yes Labs: no Today's Visit #: 38 Starting Date: 08/25/18 Comments: cat 1    Chief Complaint: OBESITY   History of Present Illness Jennifer [name] is a 73 year old female who presents for obesity treatment and progress assessment.  She is adhering to the category one eating plan about fifty percent of the time and is working on incorporating more whole foods such as fruits and vegetables into her diet. She is attempting to meet her protein goals but struggles with adequate water  intake. She is not currently engaging in exercise and is not skipping meals.   For hyperlipidemia, she is on pravastatin  80 mg and Zetia  10 mg daily. Her most recent lab results from June show an LDL level of 101 mg/dL, which is slightly increased from last year.  She experienced a recent episode of leg rash and swelling after exposure to a hot environment. A D-dimer test was slightly abnormal, prompting an ultrasound that returned normal results. She wore compression hose temporarily, and the swelling has since resolved. The swelling was localized to one leg.  She feels mentally fatigued from work, which impacts her ability to exercise. She works part-time in a stressful environment, contributing to her fatigue, but enjoys the social interactions her job provides.  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 She is currently managing paperwork for her driver's license renewal, which requires documentation from her doctors regarding her seizure and CPAP use.      PHYSICAL EXAM:  Blood pressure 115/72, pulse 69, temperature 98.2 F (36.8 C), height 5' 2 (1.575 m), weight 186 lb (84.4 kg), SpO2 94%. Body mass index is 34.02 kg/m.  DIAGNOSTIC DATA REVIEWED:  BMET    Component Value Date/Time   NA 139 06/25/2023 0945   K 4.4 06/25/2023 0945   CL 101 06/25/2023 0945   CO2 20 06/25/2023 0945   GLUCOSE 78 06/25/2023 0945   GLUCOSE 77 10/14/2022 1422   BUN 22 06/25/2023 0945   CREATININE 0.65 06/25/2023 0945   CREATININE 0.72 09/02/2015 1207   CALCIUM  10.2 06/25/2023 0945   GFRNONAA 84 01/17/2020 1106   GFRNONAA 89 09/02/2015 1207   GFRAA 96 01/17/2020 1106   GFRAA >89 09/02/2015 1207   Lab Results  Component Value Date   HGBA1C 5.3 07/23/2023   HGBA1C 5.9 03/13/2016   Lab Results  Component Value Date   INSULIN  9.5 06/25/2023   INSULIN  14.7 08/25/2018   Lab Results  Component Value Date   TSH 3.020 06/25/2023   CBC    Component Value Date/Time   WBC 5.5 06/25/2023 0945   WBC 5.8 10/14/2022 1422   RBC 4.33 06/25/2023 0945   RBC 4.21 10/14/2022 1422   HGB 13.0 06/25/2023 0945   HCT 40.4 06/25/2023 0945   PLT 297 06/25/2023 0945   MCV 93 06/25/2023 0945  MCH 30.0 06/25/2023 0945   MCH 31.4 07/21/2018 0304   MCHC 32.2 06/25/2023 0945   MCHC 33.7 10/14/2022 1422   RDW 12.2 06/25/2023 0945   Iron Studies    Component Value Date/Time   IRON 110 11/01/2019 0853   TIBC 382 11/01/2019 0853   IRONPCTSAT 29 11/01/2019 0853   Lipid Panel     Component Value Date/Time   CHOL 192 06/25/2023 0945   TRIG 235 (H) 06/25/2023 0945   HDL 51 06/25/2023 0945   CHOLHDL 3.8 12/25/2020 1230   CHOLHDL 5 05/04/2018 0805   VLDL 78.4 (H) 05/04/2018 0805   LDLCALC 101 (H) 06/25/2023 0945   LDLDIRECT 96.0 05/04/2018 0805   Hepatic Function Panel      Component Value Date/Time   PROT 7.4 06/25/2023 0945   ALBUMIN 4.6 06/25/2023 0945   AST 19 06/25/2023 0945   ALT 17 06/25/2023 0945   ALKPHOS 97 06/25/2023 0945   BILITOT 0.5 06/25/2023 0945      Component Value Date/Time   TSH 3.020 06/25/2023 0945   Nutritional Lab Results  Component Value Date   VD25OH 62.6 06/25/2023   VD25OH 39.5 12/25/2022   VD25OH 40.8 06/12/2022     Assessment and Plan Assessment & Plan Obesity Obesity management is ongoing with a focus on dietary changes and exercise. Currently following the category one eating plan about 50% of the time. Not engaging in regular exercise. Struggles with hydration and consumes some carbohydrates. Weight is stable around 186 lbs. Visceral fat is at 14, with a goal of 12 or below. - Encourage adherence to the category one eating plan. - Increase physical activity, aiming for at least three sessions per week. - Consider walking at [facility] or using indoor exercise videos. - Purchase new walking shoes from [employer]. - Monitor weight and visceral fat.  Hypertension Hypertension is well-controlled with a blood pressure reading of 115/72 mmHg. Managed with losartan . - Continue diet, exercise and weight loss as discussed today as an important part of the treatment plan  Hyperlipidemia Hyperlipidemia is managed with pravastatin  80 mg and Zetia  10 mg daily. Recent labs show LDL at 101, slightly increased from last year. Discussed the impact of recent dietary habits on triglyceride levels and the importance of maintaining a consistent diet to manage lipid levels. - Continue diet, exercise and weight loss as discussed today as an important part of the treatment plan  Fatigue  Reports sleeping 7 hours per night on average and feeling mentally tired, likely due to work-related stress and lack of exercise. Discussed the importance of exercise in improving energy levels and overall well-being. Encouraged to find a balance  between work and personal life to reduce stress and improve sleep quality. - Encourage regular physical activity to improve energy levels. - Consider setting specific exercise goals, such as three sessions per week.     I have personally spent 42 minutes total time today in preparation, patient care, and documentation for this visit, including the following: review of clinical lab tests; review of medical history, review of behavioral modifications for all of her medical issues discussed today   She was informed of the importance of frequent follow up visits to maximize her success with intensive lifestyle modifications for her multiple health conditions.    Louann Penton, MD

## 2023-09-10 ENCOUNTER — Encounter: Payer: Self-pay | Admitting: Family Medicine

## 2023-09-10 NOTE — Telephone Encounter (Signed)
 Called patient as well to schedule appointment, Left Vm to return call and sent mychart message as well.

## 2023-09-10 NOTE — Telephone Encounter (Signed)
 Patient has an appointment scheduled with Dr.vincent on Friday for these new symptoms she is having regarding her urine. Patient is questioning if cranberry juice will help with her symptoms until she able to be seen on Friday?

## 2023-09-12 ENCOUNTER — Encounter: Payer: Self-pay | Admitting: Student in an Organized Health Care Education/Training Program

## 2023-09-12 ENCOUNTER — Ambulatory Visit (INDEPENDENT_AMBULATORY_CARE_PROVIDER_SITE_OTHER): Admitting: Student in an Organized Health Care Education/Training Program

## 2023-09-12 VITALS — BP 163/56 | HR 54 | Temp 97.0°F | Wt 193.0 lb

## 2023-09-12 DIAGNOSIS — R399 Unspecified symptoms and signs involving the genitourinary system: Secondary | ICD-10-CM

## 2023-09-12 DIAGNOSIS — R3129 Other microscopic hematuria: Secondary | ICD-10-CM | POA: Diagnosis not present

## 2023-09-12 DIAGNOSIS — Z1231 Encounter for screening mammogram for malignant neoplasm of breast: Secondary | ICD-10-CM | POA: Diagnosis not present

## 2023-09-12 LAB — URINALYSIS, ROUTINE W REFLEX MICROSCOPIC
Bilirubin Urine: NEGATIVE
Ketones, ur: NEGATIVE
Nitrite: NEGATIVE
Specific Gravity, Urine: 1.01 (ref 1.000–1.030)
Total Protein, Urine: NEGATIVE
Urine Glucose: NEGATIVE
Urobilinogen, UA: 0.2 (ref 0.0–1.0)
pH: 7 (ref 5.0–8.0)

## 2023-09-12 LAB — POCT URINALYSIS DIPSTICK
Bilirubin, UA: NEGATIVE
Blood, UA: 1
Glucose, UA: NEGATIVE
Ketones, UA: NEGATIVE
Nitrite, UA: NEGATIVE
Protein, UA: NEGATIVE
Spec Grav, UA: 1.01 (ref 1.010–1.025)
Urobilinogen, UA: 0.2 U/dL
pH, UA: 6.5 (ref 5.0–8.0)

## 2023-09-12 LAB — HM MAMMOGRAPHY

## 2023-09-12 MED ORDER — NITROFURANTOIN MONOHYD MACRO 100 MG PO CAPS
100.0000 mg | ORAL_CAPSULE | Freq: Two times a day (BID) | ORAL | 0 refills | Status: AC
Start: 2023-09-12 — End: 2023-09-17

## 2023-09-12 NOTE — Assessment & Plan Note (Signed)
 Acute issue over the last 3 days.  Her symptoms are really intermediate risk for acute cystitis.  Her urinalysis at point-of-care shows leukocytes and hematuria.  Based on her age I think there is a high chance she also has asymptomatic bacteriuria.  Her symptoms I think are significant enough and with the hematuria that I will treat her for an acute cystitis.  She has allergies to penicillin, no documented cephalosporin use, allergy to sulfa, so we will use Macrobid  100 mg twice daily.  Normal renal function.  Encouraged good hydration.  No systemic symptoms to suggest pyelonephritis.

## 2023-09-12 NOTE — Telephone Encounter (Signed)
 Patient is questioning if there are certain beverages she should stay away from like soda and what beverages would be good?

## 2023-09-12 NOTE — Patient Instructions (Signed)
  VISIT SUMMARY: Jennifer [name], you came in today with urinary symptoms including cloudy urine and a burning sensation. You also reported increased frequency and urgency of urination, especially at night. You have a history of a similar episode several years ago and have allergies to penicillin and sulfa drugs.  YOUR PLAN: -URINARY TRACT INFECTION WITH HEMATURIA: A urinary tract infection (UTI) is an infection in any part of your urinary system, and hematuria means there is blood in your urine. You have been prescribed a three-day course of antibiotics to treat the infection. We will send your urine for culture to identify the bacteria causing the infection. If the blood in your urine persists after treatment, we may need to do further tests, including a CT scan and a referral to a urologist.  -ALLERGY TO PENICILLINS AND SULFONAMIDES: You have allergies to penicillins and sulfa drugs, which cause rashes. We have selected an antibiotic that does not contain these substances to treat your current urinary tract infection.  INSTRUCTIONS: Please take the prescribed antibiotics as directed for three days. We will contact you with the results of your urine culture. If your symptoms do not improve or if the blood in your urine persists, please schedule a follow-up appointment. Further tests may be necessary if the hematuria continues.

## 2023-09-12 NOTE — Assessment & Plan Note (Signed)
 Microscopic hematuria on urinalysis today.  May be related to acute cystitis but she reports having a history of hematuria many years ago.  No tobacco use.  She reports a urology workup about 40 years ago and some kind of bladder procedure.  I do not see anything about this and her surgical history.  Will bring her back to the office in 2 weeks for repeat urinalysis.  If microscopic hematuria is persistent after treatment of cystitis, will recommend CT hematuria and urology referral for cystoscopy.

## 2023-09-12 NOTE — Progress Notes (Signed)
 Acute Office Visit  Subjective:     Patient ID: Jennifer [name], female    DOB: 03/04/1950, 73 y.o.   MRN: [id number]  Chief Complaint  Patient presents with   urine    Was fine Tuesday but then woke up Wednesday with cloudy urine and foul smelling urine. Burning sensation as well when urinating. Has been drinking fluids Patient does like to drink sodas.     HPI  Discussed the use of AI scribe software for clinical note transcription with the patient, who gave verbal consent to proceed.  History of Present Illness Jennifer [name] is a 73 year old female who presents with urinary symptoms including cloudy urine and burning sensation.  She woke up on Wednesday morning with urine that had a strong odor and appeared very cloudy. She increased her fluid intake, and by Thursday morning, her urine was clear. However, on Friday morning, the cloudiness returned. She experiences frequency, urgency, and a burning sensation on the outside, though she is unsure if it is vaginal burning. No vaginal discharge is noted.  She recalls a similar episode approximately five to six years ago while on vacation in [place] , where she was diagnosed with a urinary tract infection at an urgent care facility. She does not frequently experience UTIs.  She maintains that she stays well-hydrated, drinking water  and diet soda throughout the day. 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 She denies feeling dehydrated on the days her symptoms appeared.  No fever or chills are reported, and she generally feels well, though she notes feeling 'a little more blah than normal.' She mentions an increase in nocturia, getting up three times during the night, which is unusual for her. She also experiences urgency when getting up from a sedentary position.  She has a history of microscopic hematuria, for which she has undergone ultrasounds in the past, with  no significant findings. She denies any history of kidney stones. She has a family history of kidney cancer, with her mother and maternal grandmother having had the condition.  She has allergies to penicillin and sulfa drugs, which cause rashes. She does not recall the specific antibiotics used in her previous UTI treatment.      Objective:    BP (!) 163/56   Pulse (!) 54   Temp (!) 97 F (36.1 C) (Temporal)   Wt 193 lb (87.5 kg)   LMP  (LMP Unknown)   SpO2 99%   BMI 35.30 kg/m    Physical Exam  Gen: Well-appearing woman Abd: Soft, nontender, no discomfort with palpation over the bladder Ext: Warm, no edema Neuro: Alert, conversational, full strength upper and lower extremities, normal gait and balance  Results for orders placed or performed in visit on 09/12/23  POCT urinalysis dipstick  Result Value Ref Range   Color, UA light yellow    Clarity, UA cloudy    Glucose, UA Negative Negative   Bilirubin, UA negative    Ketones, UA negative    Spec Grav, UA 1.010 1.010 - 1.025   Blood, UA 1    pH, UA 6.5 5.0 - 8.0   Protein, UA Negative Negative   Urobilinogen, UA 0.2 0.2 or 1.0 E.U./dL   Nitrite, UA negative    Leukocytes, UA Large (3+) (A) Negative   Appearance     Odor         Assessment & Plan:    Problem List Items  Addressed This Visit       Unprioritized   Lower urinary tract symptoms - Primary   Acute issue over the last 3 days.  Her symptoms are really intermediate risk for acute cystitis.  Her urinalysis at point-of-care shows leukocytes and hematuria.  Based on her age I think there is a high chance she also has asymptomatic bacteriuria.  Her symptoms I think are significant enough and with the hematuria that I will treat her for an acute cystitis.  She has allergies to penicillin, no documented cephalosporin use, allergy to sulfa, so we will use Macrobid  100 mg twice daily.  Normal renal function.  Encouraged good hydration.  No systemic symptoms to  suggest pyelonephritis.      Relevant Medications   nitrofurantoin , macrocrystal-monohydrate, (MACROBID ) 100 MG capsule   Other Relevant Orders   POCT urinalysis dipstick (Completed)   Urinalysis, Routine w reflex microscopic   Urine Culture   Microscopic hematuria   Microscopic hematuria on urinalysis today.  May be related to acute cystitis but she reports having a history of hematuria many years ago.  No tobacco use.  She reports a urology workup about 40 years ago and some kind of bladder procedure.  I do not see anything about this and her surgical history.  Will bring her back to the office in 2 weeks for repeat urinalysis.  If microscopic hematuria is persistent after treatment of cystitis, will recommend CT hematuria and urology referral for cystoscopy.      Relevant Medications   nitrofurantoin , macrocrystal-monohydrate, (MACROBID ) 100 MG capsule    Meds ordered this encounter  Medications   nitrofurantoin , macrocrystal-monohydrate, (MACROBID ) 100 MG capsule    Sig: Take 1 capsule (100 mg total) by mouth 2 (two) times daily for 5 days.    Dispense:  10 capsule    Refill:  0    Return in about 2 weeks (around 09/26/2023) for repeat urinalysis.  Cleatus Debby Specking, MD

## 2023-09-13 LAB — URINE CULTURE
MICRO NUMBER:: 16929174
SPECIMEN QUALITY:: ADEQUATE

## 2023-09-15 ENCOUNTER — Ambulatory Visit: Payer: Self-pay | Admitting: Student in an Organized Health Care Education/Training Program

## 2023-09-15 DIAGNOSIS — R3129 Other microscopic hematuria: Secondary | ICD-10-CM

## 2023-09-22 ENCOUNTER — Ambulatory Visit (INDEPENDENT_AMBULATORY_CARE_PROVIDER_SITE_OTHER): Admitting: Family Medicine

## 2023-09-22 ENCOUNTER — Other Ambulatory Visit (INDEPENDENT_AMBULATORY_CARE_PROVIDER_SITE_OTHER): Payer: Self-pay | Admitting: Family Medicine

## 2023-09-22 ENCOUNTER — Other Ambulatory Visit: Payer: Self-pay | Admitting: Medical Genetics

## 2023-09-22 ENCOUNTER — Encounter (INDEPENDENT_AMBULATORY_CARE_PROVIDER_SITE_OTHER): Payer: Self-pay | Admitting: Family Medicine

## 2023-09-22 VITALS — BP 132/74 | HR 64 | Temp 98.7°F | Ht 62.0 in | Wt 187.0 lb

## 2023-09-22 DIAGNOSIS — R5383 Other fatigue: Secondary | ICD-10-CM

## 2023-09-22 DIAGNOSIS — E559 Vitamin D deficiency, unspecified: Secondary | ICD-10-CM | POA: Diagnosis not present

## 2023-09-22 DIAGNOSIS — Z6834 Body mass index (BMI) 34.0-34.9, adult: Secondary | ICD-10-CM | POA: Diagnosis not present

## 2023-09-22 DIAGNOSIS — E039 Hypothyroidism, unspecified: Secondary | ICD-10-CM

## 2023-09-22 DIAGNOSIS — G4733 Obstructive sleep apnea (adult) (pediatric): Secondary | ICD-10-CM | POA: Diagnosis not present

## 2023-09-22 DIAGNOSIS — F32A Depression, unspecified: Secondary | ICD-10-CM | POA: Diagnosis not present

## 2023-09-22 DIAGNOSIS — Z91199 Patient's noncompliance with other medical treatment and regimen due to unspecified reason: Secondary | ICD-10-CM

## 2023-09-22 DIAGNOSIS — E66812 Obesity, class 2: Secondary | ICD-10-CM

## 2023-09-22 MED ORDER — VITAMIN D (ERGOCALCIFEROL) 1.25 MG (50000 UNIT) PO CAPS
50000.0000 [IU] | ORAL_CAPSULE | ORAL | 0 refills | Status: DC
Start: 2023-09-22 — End: 2023-11-12

## 2023-09-22 MED ORDER — TIRZEPATIDE-WEIGHT MANAGEMENT 2.5 MG/0.5ML ~~LOC~~ SOLN
2.5000 mg | SUBCUTANEOUS | 0 refills | Status: DC
Start: 2023-09-22 — End: 2023-10-15

## 2023-09-22 NOTE — Progress Notes (Signed)
 Office: [phone]  /  Fax: [phone]  WEIGHT SUMMARY AND BIOMETRICS  Anthropometric Measurements Height: 5' 2 (1.575 m) Weight: 187 lb (84.8 kg) BMI (Calculated): 34.19 Weight at Last Visit: 186 lb Weight Lost Since Last Visit: 0 Weight Gained Since Last Visit: 1 lb Starting Weight: 208 lb Total Weight Loss (lbs): 21 lb (9.526 kg) Peak Weight: 250 lb   Body Composition  Body Fat %: 45.4 % Fat Mass (lbs): 85 lbs Muscle Mass (lbs): 97.2 lbs Total Body Water  (lbs): 75.8 lbs Visceral Fat Rating : 14   Other Clinical Data Fasting: yes Labs: no Today's Visit #: 39 Starting Date: 08/25/18 Comments: cat 1    Chief Complaint: OBESITY  History of Present Illness Jennifer [name] is a 73 year old female who presents for obesity treatment and progress assessment.  She has been prescribed the category one eating plan but adheres to it only about 30% of the time. She is not currently exercising and has gained one pound in the last month. She is working on meeting her protein goals but is skipping meals, not hydrating adequately, and not consuming enough fruits and vegetables.  She is being treated for vitamin D  deficiency with ergocalciferol , 50,000 IU every seven days and is requesting a refill.  She feels 'not herself', describing a hard summer with fatigue and experiencing 'the blobs'. She suspected a urinary tract infection due to cloudy urine with an odor, but no infection was found. She had microscopic hematuria and was found to have crystals.  She has a history of upper left quadrant pain, evaluated with a pelvic CT a year ago, showing no bile obstruction or pancreatic issues but revealing uterine fibroids. The pain persists, and she questions if it could be related to kidney issues.  She is on Celexa  20 mg, reduced from 40 mg about a year or two ago, and feels this change might be contributing to her current mood and energy levels. She feels tired and  fatigued, impacting her ability to exercise and adhere to her eating plan.  She has a history of sleep apnea and uses a CPAP machine regularly. Despite this, she feels fatigued and questions the effectiveness of her current treatment.  Her social history includes living alone and having a supportive family, though she misses her family who are currently traveling. She recently adopted a cat, which has been a source of stress as the cat is currently hiding under her house.      PHYSICAL EXAM:  Blood pressure 132/74, pulse 64, temperature 98.7 F (37.1 C), height 5' 2 (1.575 m), weight 187 lb (84.8 kg), SpO2 97%. Body mass index is 34.2 kg/m.  DIAGNOSTIC DATA REVIEWED:  BMET    Component Value Date/Time   NA 139 06/25/2023 0945   K 4.4 06/25/2023 0945   CL 101 06/25/2023 0945   CO2 20 06/25/2023 0945   GLUCOSE 78 06/25/2023 0945   GLUCOSE 77 10/14/2022 1422   BUN 22 06/25/2023 0945   CREATININE 0.65 06/25/2023 0945   CREATININE 0.72 09/02/2015 1207   CALCIUM  10.2 06/25/2023 0945   GFRNONAA 84 01/17/2020 1106   GFRNONAA 89 09/02/2015 1207   GFRAA 96 01/17/2020 1106   GFRAA >89 09/02/2015 1207   Lab Results  Component Value Date   HGBA1C 5.3 07/23/2023   HGBA1C 5.9 03/13/2016   Lab Results  Component Value Date   INSULIN  9.5 06/25/2023   INSULIN  14.7 08/25/2018   Lab Results  Component Value Date  TSH 3.020 06/25/2023   CBC    Component Value Date/Time   WBC 5.5 06/25/2023 0945   WBC 5.8 10/14/2022 1422   RBC 4.33 06/25/2023 0945   RBC 4.21 10/14/2022 1422   HGB 13.0 06/25/2023 0945   HCT 40.4 06/25/2023 0945   PLT 297 06/25/2023 0945   MCV 93 06/25/2023 0945   MCH 30.0 06/25/2023 0945   MCH 31.4 07/21/2018 0304   MCHC 32.2 06/25/2023 0945   MCHC 33.7 10/14/2022 1422   RDW 12.2 06/25/2023 0945   Iron Studies    Component Value Date/Time   IRON 110 11/01/2019 0853   TIBC 382 11/01/2019 0853   IRONPCTSAT 29 11/01/2019 0853   Lipid Panel      Component Value Date/Time   CHOL 192 06/25/2023 0945   TRIG 235 (H) 06/25/2023 0945   HDL 51 06/25/2023 0945   CHOLHDL 3.8 12/25/2020 1230   CHOLHDL 5 05/04/2018 0805   VLDL 78.4 (H) 05/04/2018 0805   LDLCALC 101 (H) 06/25/2023 0945   LDLDIRECT 96.0 05/04/2018 0805   Hepatic Function Panel     Component Value Date/Time   PROT 7.4 06/25/2023 0945   ALBUMIN 4.6 06/25/2023 0945   AST 19 06/25/2023 0945   ALT 17 06/25/2023 0945   ALKPHOS 97 06/25/2023 0945   BILITOT 0.5 06/25/2023 0945      Component Value Date/Time   TSH 3.020 06/25/2023 0945   Nutritional Lab Results  Component Value Date   VD25OH 62.6 06/25/2023   VD25OH 39.5 12/25/2022   VD25OH 40.8 06/12/2022     Assessment and Plan Assessment & Plan Obesity, class 2 with associated dietary nonadherence and weight gain Obesity class 2 with recent weight gain of 1 pound over the last month. Dietary nonadherence with only 30% adherence to the prescribed eating plan. No current exercise regimen. Expresses feelings of failure related to weight management. Discussed potential use of GLP-1 receptor agonist (Zepbound ) to aid in weight management, which may help with satiety and reduce emotional eating. Informed consent provided regarding Zepbound , including potential side effects such as nausea and the importance of maintaining adequate nutrition to avoid malnutrition. Positive effects on cardiology and kidney function noted. Qualifies for Zepbound  due to AHI of 32.2. - Initiate prior authorization for Zepbound . - Discuss potential initiation of Zepbound  if approved by insurance. - Encourage adherence to dietary plan and increase physical activity as tolerated.  Fatigue with multifactorial evaluation (including hypothyroidism, depression, sleep apnea, and possible medication effect) Persistent fatigue ongoing for several months. Possible contributing factors include suboptimal thyroid  function, depression, and sleep apnea.  Recent thyroid  function tests showed TSH on the higher end of normal. CPAP compliance reported, but potential need for re-evaluation of sleep apnea management discussed. - Discuss thyroid  function and potential adjustment of levothyroxine  with primary care provider. - Contact sleep specialist (Dr. Lurene) to discuss potential need for re-evaluation of sleep apnea management.  Hypothyroidism on levothyroxine  with possible suboptimal control Hypothyroidism managed with levothyroxine  50 mcg daily. Recent TSH levels on the higher end of normal, suggesting possible suboptimal control. Symptoms of fatigue and lack of energy persist. - Discuss potential adjustment of levothyroxine  dosage with primary care provider.  Depression with suboptimal response to current SSRI regimen Depression with suboptimal response to current Celexa  (citalopram ) 20 mg regimen. Symptoms include fatigue, lack of motivation, and feelings of not being oneself. Previous dose reduction from 40 mg to 20 mg by Dr. Landy approximately 1-2 years ago. Consideration of increasing SSRI dose or changing  medication discussed. - Discuss potential adjustment of Celexa  dosage with Dr. Landy or Dr. Jerrell at upcoming appointment.  Obstructive sleep apnea on CPAP therapy with consideration for re-evaluation Obstructive sleep apnea managed with CPAP therapy. Compliance with CPAP reported, but persistent fatigue suggests possible need for re-evaluation of current management. Previous AHI of 32.2 noted, qualifying for potential GLP-1 therapy. - Contact sleep specialist (Dr. Lurene) to discuss potential need for re-evaluation of sleep apnea management.  Vitamin D  deficiency on supplementation Vitamin D  deficiency currently managed with ergocalciferol  50,000 IU weekly. - Refill prescription for ergocalciferol  50,000 IU weekly.     I personally spent a total of 55 minutes in the care of the patient today including preparing to see the patient,  reviewing separately obtained history, performing a medically appropriate evaluation of current problems, placing orders in the EMR, documenting clinical information in the EMR, customized nutritional counseling for their specific health and social needs, explaining the pathophysiology of obesity and how it is significantly more complex than eat less and exercise more, and discussing eating out strategies and ways to minimize common nutritional pitfalls.    Laprecious was informed of the importance of frequent follow up visits to maximize her success with intensive lifestyle modifications for her obesity and obesity related health conditions as recommended by USPSTF and CMS guidelines   Louann Penton, MD

## 2023-09-23 ENCOUNTER — Other Ambulatory Visit: Payer: Self-pay | Admitting: Family Medicine

## 2023-09-23 DIAGNOSIS — I10 Essential (primary) hypertension: Secondary | ICD-10-CM

## 2023-09-23 DIAGNOSIS — E119 Type 2 diabetes mellitus without complications: Secondary | ICD-10-CM

## 2023-09-24 ENCOUNTER — Ambulatory Visit (HOSPITAL_COMMUNITY)
Admission: RE | Admit: 2023-09-24 | Discharge: 2023-09-24 | Disposition: A | Discharge status: Home / Self Care | Payer: PRIVATE HEALTH INSURANCE | Source: Ambulatory Visit | Attending: Student in an Organized Health Care Education/Training Program | Admitting: Student in an Organized Health Care Education/Training Program

## 2023-09-24 DIAGNOSIS — R3129 Other microscopic hematuria: Secondary | ICD-10-CM | POA: Diagnosis not present

## 2023-09-24 DIAGNOSIS — Z9049 Acquired absence of other specified parts of digestive tract: Secondary | ICD-10-CM | POA: Diagnosis not present

## 2023-09-24 DIAGNOSIS — D259 Leiomyoma of uterus, unspecified: Secondary | ICD-10-CM | POA: Diagnosis not present

## 2023-09-24 MED ORDER — IOHEXOL 300 MG/ML  SOLN
100.0000 mL | Freq: Once | INTRAMUSCULAR | Status: AC | PRN
  Administered 2023-09-24: 100 mL via INTRAVENOUS

## 2023-09-26 ENCOUNTER — Encounter: Payer: Self-pay | Admitting: Family Medicine

## 2023-09-26 ENCOUNTER — Ambulatory Visit: Payer: Self-pay | Admitting: Student in an Organized Health Care Education/Training Program

## 2023-09-26 ENCOUNTER — Ambulatory Visit: Payer: PRIVATE HEALTH INSURANCE | Admitting: Family Medicine

## 2023-09-26 VITALS — BP 136/80 | HR 64 | Temp 97.9°F | Ht 62.0 in | Wt 194.0 lb

## 2023-09-26 DIAGNOSIS — R5383 Other fatigue: Secondary | ICD-10-CM

## 2023-09-26 DIAGNOSIS — E782 Mixed hyperlipidemia: Secondary | ICD-10-CM

## 2023-09-26 DIAGNOSIS — I10 Essential (primary) hypertension: Secondary | ICD-10-CM

## 2023-09-26 DIAGNOSIS — E039 Hypothyroidism, unspecified: Secondary | ICD-10-CM

## 2023-09-26 DIAGNOSIS — F329 Major depressive disorder, single episode, unspecified: Secondary | ICD-10-CM

## 2023-09-26 DIAGNOSIS — R3129 Other microscopic hematuria: Secondary | ICD-10-CM | POA: Diagnosis not present

## 2023-09-26 LAB — POCT URINALYSIS DIP (MANUAL ENTRY)
Bilirubin, UA: NEGATIVE
Glucose, UA: NEGATIVE mg/dL
Ketones, POC UA: NEGATIVE mg/dL
Leukocytes, UA: NEGATIVE
Nitrite, UA: NEGATIVE
Protein Ur, POC: NEGATIVE mg/dL
Spec Grav, UA: <=1.005 — AB (ref 1.010–1.025)
Urobilinogen, UA: 0.2 U/dL
pH, UA: 6 (ref 5.0–8.0)

## 2023-09-26 LAB — URINALYSIS, MICROSCOPIC ONLY

## 2023-09-26 MED ORDER — CITALOPRAM HYDROBROMIDE 40 MG PO TABS: 40.0000 mg | ORAL_TABLET | Freq: Every day | ORAL | 3 refills | Status: DC

## 2023-09-26 NOTE — Patient Instructions (Addendum)
 Based on your last LDL I would consider switching back to Lipitor at some point but lets work on some of the other conditions for now.  Repeat labs in 3 months, and if LDL is still over 70 lets look back at Lipitor as an option.  Continue high-dose pravastatin  for now along with Zetia .  Let's try increasing citalopram  to 40mg  for now and recheck in 4-6 weeks. Can decide on other causes of fatigue if not improving at that time. It may also be helpful to meet with therapist through [facility].   Blood was noted on urine test again today.  CT scanning was reassuring but I think it would be worthwhile to meet with urology to decide if other testing needed.  I have placed that referral.  I will see you in a month but keep me posted if any concerns in the meantime.    Take care.

## 2023-09-26 NOTE — Progress Notes (Signed)
 Subjective:  Patient ID: Jennifer [name], female    DOB: 11-Mar-1950  Age: 73 y.o. MRN: [id number]  CC:  Chief Complaint  Patient presents with   Follow-up    2wk urinalysis repeat; been having extreme fatigue, might want thyroid  meds checked; Celexa  decreased from 40 to 20 and it has been very difficult this summer, been down and feeling lousy. Want to discuss CT scan from Wed, main concern of fibroid   HPI Jennifer [name] presents for multiple concerns as above.  Last visit in October 2024.  She has been seen by my colleague for acute issues recently. Followed by Dr. Verdon with [facility] for obesity.  Trying to get zepbound  covered. vitamin D  supplementation.  MiraLAX  for constipation. Followed by Dr. Buck with history of seizure, treated with Keppra .  UTI with hematuria Treated for UTI, microscopic hematuria noted on her September 5 visit with Dr. Jerrell.  Remote history of urology eval.  Treated with Macrobid  for UTI.  Urine culture indicated less than 10,000 CFU of single gram-negative organism.  Symptoms were borderline for cystitis.  On follow-up called Macrobid  was discontinued.  Calcium  oxalate crystals were noted on urinalysis, possible nephrolithiasis.  CT hematuria eval ordered with option of urology follow-up. CT hematuria workup on September 17.  No CT findings to explain hematuria, no evidence of urinary tract calculus mass or hydronephrosis, no urinary tract filling defect.  Moderate burden of dense stool stool balls throughout the colon and rectum, uterine fibroids were noted. Plan for repeat urinalysis today.  Option of cystoscopy or further management of fibroids per Dr. Jerrell, with question of possible abnormal uterine bleeding contributing to the hematuria.   Denies any gross hematuria, no dysuria/urgency/frequency.  No lower abdominal pain. No vaginal bleeding. Wears pad for any urinary leakage - no blood seen.   Hypertension: Losartan   50 mg daily.  Labs with Dr. Verdon in June. Home readings: 130's or lower. Stable at home.  No new side effects with meds.   BP Readings from Last 3 Encounters:  09/26/23 136/80  09/22/23 132/74  09/12/23 (!) 163/56   Lab Results  Component Value Date   CREATININE 0.65 06/25/2023   Hyperlipidemia: Pravastatin  80 mg daily, zetia .  Did not tolerate Crestor, prior prescription for Lipitor in 2014, 2015, but I do not see why that was discontinued and back on pravastatin . No new myalgias.  Aortic atherosclerosis noted on recent CT  Lab Results  Component Value Date   CHOL 192 06/25/2023   HDL 51 06/25/2023   LDLCALC 101 (H) 06/25/2023   LDLDIRECT 96.0 05/04/2018   TRIG 235 (H) 06/25/2023   CHOLHDL 3.8 12/25/2020   Lab Results  Component Value Date   ALT 17 06/25/2023   AST 19 06/25/2023   ALKPHOS 97 06/25/2023   BILITOT 0.5 06/25/2023   Hypothyroidism: Lab Results  Component Value Date   TSH 3.020 06/25/2023  Synthroid  50 mcg daily.  Has noticed fatigue, but recent TSH was in the normal range in June.  Treated for depression as below. Lab Results  Component Value Date   WBC 5.5 06/25/2023   HGB 13.0 06/25/2023   HCT 40.4 06/25/2023   MCV 93 06/25/2023   PLT 297 06/25/2023    Depression: Celexa  20 mg daily.  Has been on higher dosing in past. Has been feeling more down.  Fatigue.  Difficult this summer. Felt down. No energy. Feeling down, no energy. No SI/HI. Has not met with therapist in awhile.  Considering [facility] at [facility]. Some mood symptoms that are associated with her weight difficulties.      09/26/2023   11:05 AM 01/21/2023    4:04 PM 09/23/2022    3:19 PM 06/24/2022    1:05 PM 11/07/2021   11:37 AM  Depression screen PHQ 2/9  Decreased Interest 2 1 1 1  0  Down, Depressed, Hopeless 2 2 1  0 0  PHQ - 2 Score 4 3 2 1  0  Altered sleeping 2 0 1 1   Tired, decreased energy 2 0 1 3   Change in appetite 2 0 0 1   Feeling bad or failure about  yourself  1 0 1 1   Trouble concentrating 0 0 0 1   Moving slowly or fidgety/restless 0 0 0 0   Suicidal thoughts 0 0 0 0   PHQ-9 Score 11 3 5 8    Difficult doing work/chores Not difficult at all Not difficult at all Not difficult at all         History Patient Active Problem List   Diagnosis Date Noted   Lower urinary tract symptoms 09/12/2023   Microscopic hematuria 09/12/2023   Actinic keratosis 08/08/2023   Melanocytic nevus of trunk 08/08/2023   Lentigo 08/08/2023   Benign neoplasm of skin of lower extremity 08/08/2023   Angioma of skin 08/08/2023   Right leg swelling 08/08/2023   Other hyperlipidemia 06/12/2022   Stress 04/17/2022   BMI 34.0-34.9,adult 02/20/2022   Obesity, Beginning BMI 36.8 02/20/2022   Hypothyroidism 12/17/2021   Mouth abscess 12/17/2021   Hyperglycemia 12/17/2021   Pain of right hip 10/01/2021   Mixed hyperlipidemia 06/06/2021   Insulin  resistance 06/06/2021   Vitamin D  deficiency 05/07/2021   Elevated LFTs 11/01/2019   Abrasion of left buttock 08/05/2018   S/P total knee arthroplasty, left 07/20/2018   Degenerative arthritis of left knee 07/17/2018   Other constipation 12/20/2017   Class 2 severe obesity with serious comorbidity and body mass index (BMI) of 36.0 to 36.9 in adult [facility]) 12/20/2017   Primary osteoarthritis of right hip 10/20/2015   Arthritis of right hip 10/20/2015   Localization-related idiopathic epilepsy and epileptic syndromes with seizures of localized onset, not intractable, without status epilepticus (HCC) 09/09/2014   Depression with anxiety 07/19/2014   Hx of adenomatous polyp of colon 03/29/2014   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 PAC'S COMES AND GOES   Gallbladder problem    GERD (gastroesophageal reflux disease)    High cholesterol    Hx of adenomatous polyp of colon    2012 - [place]   Hyperlipidemia 1985   Hypertension 1985   seems like forever!   Hypothyroid    Joint pain    no current problems   Osteoarthritis    PLMD (periodic limb movement disorder) 04/24/2012   Prediabetes    Seizures (HCC) 08/29/2013   only 1 seizure episode   Sleep apnea 2014   wears CPAP nightly   Vitamin D  deficiency    Past Surgical History:  Procedure Laterality Date  APPENDECTOMY  1978   CESAREAN SECTION      x1   CHOLECYSTECTOMY  1978   COLONOSCOPY     DILATION AND CURETTAGE OF UTERUS     JOINT REPLACEMENT  1998, 2017,2020   right knee   OSTEOTOMY PROXIMAL FEMORAL     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   Procedure: TOTAL HIP ARTHROPLASTY ANTERIOR APPROACH;  Surgeon: Dempsey Sensor, MD;  Location: [facility] OR;  Service: Orthopedics;  Laterality: Right;   TOTAL KNEE ARTHROPLASTY Left 07/20/2018   Procedure: Left Knee Arthroplasty;  Surgeon: Sensor Dempsey, MD;  Location: [facility];  Service: Orthopedics;  Laterality: Left;   Allergies  Allergen Reactions   Crestor [Rosuvastatin] Other (See Comments)    Muscle aches   Neomycin-Bacitracin Zn-Polymyx Swelling    OPTHALMIC EXPOSURE ONLY - - SWELLING EYES    Neomycin-Bacitracin-Polymyxin [Bacitracin-Neomycin-Polymyxin] Swelling    Reaction to eye drops   Codeine Rash   Feldene [Piroxicam] Rash   Penicillins Rash    Has patient had a PCN reaction causing immediate rash, facial/tongue/throat swelling, SOB or lightheadedness with hypotension:Yes Has patient had a PCN reaction causing severe rash involving mucus membranes or skin necrosis: No Has patient had a PCN reaction that required hospitalization:No Has patient had a PCN reaction occurring  within the last 10 years:No If all of the above answers are NO, then may proceed with Cephalosporin use.    Piroxicam Rash   Sulfa Antibiotics Rash   Sulfonamide Derivatives Rash   Prior to Admission medications   Medication Sig Start Date End Date Taking? Authorizing Provider  Biotin  1000 MCG tablet Take 1,000 mcg by mouth daily.   Yes [provider]  CALCIUM  CARBONATE PO Take 600 mg by mouth daily.    Yes [provider]  cetirizine (ZYRTEC) 10 MG tablet Take 10 mg by mouth every evening.    Yes [provider]  citalopram  (CELEXA ) 20 MG tablet TAKE 1 TABLET BY MOUTH EVERY DAY 06/16/23  Yes Levora Reyes SAUNDERS, MD  ezetimibe  (ZETIA ) 10 MG tablet TAKE 1 TABLET BY MOUTH EVERY DAY 06/16/23  Yes Levora Reyes SAUNDERS, MD  hydroxypropyl methylcellulose / hypromellose (ISOPTO TEARS / GONIOVISC) 2.5 % ophthalmic solution Place 1 drop into both eyes 2 (two) times daily as needed for dry eyes.   Yes [provider]  levETIRAcetam  (KEPPRA ) 500 MG tablet Take 1 tablet (500 mg total) by mouth 2 (two) times daily. 01/15/23  Yes Georjean Darice HERO, MD  levothyroxine  (SYNTHROID ) 50 MCG tablet Take 1 tablet (50 mcg total) by mouth daily. 02/10/23  Yes Levora Reyes SAUNDERS, MD  losartan  (COZAAR ) 25 MG tablet TAKE 2 TABLETS BY MOUTH EVERY DAY 09/23/23  Yes Levora Reyes SAUNDERS, MD  Multiple Vitamins-Minerals (MULTIPLE VITAMINS/WOMENS PO) Take 1 tablet by mouth daily.    Yes [provider]  polyethylene glycol (MIRALAX ) 17 g packet Take 17 g by mouth daily. 04/30/23  Yes Beasley, Caren D, MD  pravastatin  (PRAVACHOL ) 80 MG tablet TAKE 1 TABLET BY MOUTH EVERY DAY 11/25/22  Yes Levora Reyes SAUNDERS, MD  tirzepatide  (ZEPBOUND ) 2.5 MG/0.5ML injection vial Inject 2.5 mg into the skin once a week. 09/22/23  Yes Beasley, Caren D, MD  Vitamin D , Ergocalciferol , (DRISDOL ) 1.25 MG (50000 UNIT) CAPS capsule Take 1 capsule (50,000 Units total) by mouth every 7 (seven) days. 09/22/23  Yes Verdon Louann BIRCH,  MD   Social History   Socioeconomic History   Marital status: Divorced    Spouse name: Not on file  Number of children: 1   Years of education: Not on file   Highest education level: Associate degree: occupational, Scientist, product/process development, or vocational program  Occupational History   Occupation: Retired    Associate Professor: [employer]  Tobacco Use   Smoking status: Never   Smokeless tobacco: Never  Vaping Use   Vaping status: Never Used  Substance and Sexual Activity   Alcohol  use: No    Alcohol /week: 0.0 standard drinks of alcohol    Drug use: No   Sexual activity: Never    Birth control/protection: Post-menopausal  Other Topics Concern   Not on file  Social History Narrative   Lives with blind dog and cat.    Right handed    Social Drivers of Health   Financial Resource Strain: Low Risk  (08/07/2023)   Overall Financial Resource Strain (CARDIA)    Difficulty of Paying Living Expenses: Not hard at all  Food Insecurity: No Food Insecurity (08/07/2023)   Hunger Vital Sign    Worried About Running Out of Food in the Last Year: Never true    Ran Out of Food in the Last Year: Never true  Transportation Needs: No Transportation Needs (08/07/2023)   PRAPARE - Administrator, Civil Service (Medical): No    Lack of Transportation (Non-Medical): No  Physical Activity: Inactive (08/07/2023)   Exercise Vital Sign    Days of Exercise per Week: 0 days    Minutes of Exercise per Session: Not on file  Stress: No Stress Concern Present (08/07/2023)   [employer] of Occupational Health - Occupational Stress Questionnaire    Feeling of Stress: Only a little  Social Connections: Moderately Integrated (08/07/2023)   Social Connection and Isolation Panel    Frequency of Communication with Friends and Family: More than three times a week    Frequency of Social Gatherings with Friends and Family: Twice a week    Attends Religious Services: More than 4 times per year    Active Member of [employer]  or Organizations: Yes    Attends Banker Meetings: More than 4 times per year    Marital Status: Divorced  Intimate Partner Violence: Not At Risk (01/21/2023)   Humiliation, Afraid, Rape, and Kick questionnaire    Fear of Current or Ex-Partner: No    Emotionally Abused: No    Physically Abused: No    Sexually Abused: No    Review of Systems Per HPI.   Objective:   Vitals:   09/26/23 1051  BP: 136/80  Pulse: 64  Temp: 97.9 F (36.6 C)  SpO2: 96%  Weight: 194 lb (88 kg)  Height: 5' 2 (1.575 m)     Physical Exam Vitals reviewed.  Constitutional:      Appearance: Normal appearance. She is well-developed.  HENT:     Head: Normocephalic and atraumatic.  Eyes:     Conjunctiva/sclera: Conjunctivae normal.     Pupils: Pupils are equal, round, and reactive to light.  Neck:     Vascular: No carotid bruit.  Cardiovascular:     Rate and Rhythm: Normal rate and regular rhythm.     Heart sounds: Normal heart sounds.  Pulmonary:     Effort: Pulmonary effort is normal.     Breath sounds: Normal breath sounds.  Abdominal:     General: Abdomen is flat. There is no distension.     Palpations: Abdomen is soft. There is no pulsatile mass.     Tenderness: There is no abdominal tenderness.  Musculoskeletal:  Right lower leg: No edema.     Left lower leg: No edema.  Skin:    General: Skin is warm and dry.  Neurological:     Mental Status: She is alert and oriented to person, place, and time.  Psychiatric:        Mood and Affect: Mood normal.        Behavior: Behavior normal.    Results for orders placed or performed in visit on 09/26/23  POCT urinalysis dipstick   Collection Time: 09/26/23 11:51 AM  Result Value Ref Range   Color, UA light yellow (A) yellow   Clarity, UA clear clear   Glucose, UA negative negative mg/dL   Bilirubin, UA negative negative   Ketones, POC UA negative negative mg/dL   Spec Grav, UA <=8.994 (A) 1.010 - 1.025   Blood, UA large  (A) negative   pH, UA 6.0 5.0 - 8.0   Protein Ur, POC negative negative mg/dL   Urobilinogen, UA 0.2 0.2 or 1.0 E.U./dL   Nitrite, UA Negative Negative   Leukocytes, UA Negative Negative      Assessment & Plan:  Jennifer [name] is a 73 y.o. female . Essential hypertension  - Stable with current regimen, no changes.  Microscopic hematuria - Plan: POCT urinalysis dipstick, Ambulatory referral to Urology, Urine Microscopic  - Recurrent microscopic hematuria.  She may have had evaluation in the past, but with recurrent symptoms, no sign of hematuria on CT, will have her meet with urology to decide if further testing indicated.  Urine micro with few RBCs, likely will have repeat testing through urology.  No changes for now.  Less likely uterine bleeding cause.  Mixed hyperlipidemia  - As above would consider stronger statin, and potentially could try Lipitor, unknown reason why this was discontinued previously.  Intolerant to Crestor.  But with other change in meds below we will change 1 med at a time, close follow-up, repeat labs next few months.  Fatigue, unspecified type - Plan: citalopram  (CELEXA ) 40 MG tablet  - Likely related to depression, change Celexa  to higher dose for now.  Close follow-up.  Acquired hypothyroidism Stable recent labs, no changes.  Current episode of major depressive disorder without prior episode, unspecified depression episode severity - Plan: citalopram  (CELEXA ) 40 MG tablet Decreased control, will try higher dose of Celexa  for now, close follow-up within 1 month.  RTC precautions if worsening symptoms or new symptoms.  Therapist through [facility] may also be helpful.  Meds ordered this encounter  Medications   citalopram  (CELEXA ) 40 MG tablet    Sig: Take 1 tablet (40 mg total) by mouth daily.    Dispense:  30 tablet    Refill:  3   Patient Instructions  Based on your last LDL I would consider switching back to Lipitor at some point  but lets work on some of the other conditions for now.  Repeat labs in 3 months, and if LDL is still over 70 lets look back at Lipitor as an option.  Continue high-dose pravastatin  for now along with Zetia .  Let's try increasing citalopram  to 40mg  for now and recheck in 4-6 weeks. Can decide on other causes of fatigue if not improving at that time. It may also be helpful to meet with therapist through [facility].   Blood was noted on urine test again today.  CT scanning was reassuring but I think it would be worthwhile to meet with urology to decide if  other testing needed.  I have placed that referral.  I will see you in a month but keep me posted if any concerns in the meantime.    Take care.     Signed,   Reyes Pines, MD [facility], [facility] 09/26/23 11:55 AM

## 2023-09-29 NOTE — Telephone Encounter (Signed)
 Insurance cards received today.  Prior shara has been submitted for OSA, Zepbound .  Awaiting determination.

## 2023-09-30 NOTE — Telephone Encounter (Signed)
 Dear Jennifer [name]: Jennifer [name] has approved a request from you or your doctor for ZEPBOUND  Soln Auto-inj  2.5MG /0.5ML. This approval is for 09/15/2023 until further notice. This drug has been approved under the Member's Medicare Part D benefit. Approved  quantity: 2 units per 28 day(s)  Patient notified via my chart.

## 2023-10-06 ENCOUNTER — Encounter (INDEPENDENT_AMBULATORY_CARE_PROVIDER_SITE_OTHER): Payer: Self-pay | Admitting: Family Medicine

## 2023-10-15 ENCOUNTER — Encounter (INDEPENDENT_AMBULATORY_CARE_PROVIDER_SITE_OTHER): Payer: Self-pay | Admitting: Family Medicine

## 2023-10-15 ENCOUNTER — Ambulatory Visit (INDEPENDENT_AMBULATORY_CARE_PROVIDER_SITE_OTHER): Admitting: Family Medicine

## 2023-10-15 VITALS — BP 118/73 | HR 69 | Temp 98.0°F | Ht 62.0 in | Wt 187.0 lb

## 2023-10-15 DIAGNOSIS — F418 Other specified anxiety disorders: Secondary | ICD-10-CM

## 2023-10-15 DIAGNOSIS — G4733 Obstructive sleep apnea (adult) (pediatric): Secondary | ICD-10-CM

## 2023-10-15 DIAGNOSIS — Z6834 Body mass index (BMI) 34.0-34.9, adult: Secondary | ICD-10-CM | POA: Diagnosis not present

## 2023-10-15 DIAGNOSIS — E559 Vitamin D deficiency, unspecified: Secondary | ICD-10-CM

## 2023-10-15 DIAGNOSIS — F5089 Other specified eating disorder: Secondary | ICD-10-CM

## 2023-10-15 DIAGNOSIS — E669 Obesity, unspecified: Secondary | ICD-10-CM | POA: Diagnosis not present

## 2023-10-15 DIAGNOSIS — Z6836 Body mass index (BMI) 36.0-36.9, adult: Secondary | ICD-10-CM

## 2023-10-15 NOTE — Progress Notes (Signed)
 Office: [phone]  /  Fax: [phone]  WEIGHT SUMMARY AND BIOMETRICS  Anthropometric Measurements Height: 5' 2 (1.575 m) Weight: 187 lb (84.8 kg) BMI (Calculated): 34.19 Weight at Last Visit: 187 lb Weight Lost Since Last Visit: 0 Weight Gained Since Last Visit: 0 Starting Weight: 208 lb Total Weight Loss (lbs): 21 lb (9.526 kg) Peak Weight: 250 lb   Body Composition  Body Fat %: 45.3 % Fat Mass (lbs): 85 lbs Muscle Mass (lbs): 97.6 lbs Total Body Water  (lbs): 75.6 lbs Visceral Fat Rating : 14   Other Clinical Data Fasting: yes Labs: no Today's Visit #: 40 Starting Date: 08/25/18 Comments: cat 1    Chief Complaint: OBESITY    History of Present Illness Jennifer [name] is a 73 year old female with obesity and obstructive sleep apnea who presents for obesity treatment and progress assessment.  She is adhering to the category one eating plan approximately sixty percent of the time, focusing on increasing her intake of fruits and vegetables and meeting her protein requirements. She is not skipping meals and is getting at least seven hours of sleep most nights. However, she is struggling with hydration and is mindful of avoiding mindless eating, especially during her upcoming trip to the beach.  Regarding her obstructive sleep apnea, she was previously prescribed Zepbound , but the cost was prohibitive at $700 per month. She continues to use her CPAP machine and reports high compliance with usage, with numbers in the high nineties.  In her social history, she mentions adopting a cat and considering adopting a 44 year old dog whose owner passed away. She has been busy with work and plans to visit [facility] with her sister and a friend.      PHYSICAL EXAM:  Blood pressure 118/73, pulse 69, temperature 98 F (36.7 C), height 5' 2 (1.575 m), weight 187 lb (84.8 kg), SpO2 94%. Body mass index is 34.2 kg/m.  DIAGNOSTIC DATA REVIEWED:  BMET     Component Value Date/Time   NA 139 06/25/2023 0945   K 4.4 06/25/2023 0945   CL 101 06/25/2023 0945   CO2 20 06/25/2023 0945   GLUCOSE 78 06/25/2023 0945   GLUCOSE 77 10/14/2022 1422   BUN 22 06/25/2023 0945   CREATININE 0.65 06/25/2023 0945   CREATININE 0.72 09/02/2015 1207   CALCIUM  10.2 06/25/2023 0945   GFRNONAA 84 01/17/2020 1106   GFRNONAA 89 09/02/2015 1207   GFRAA 96 01/17/2020 1106   GFRAA >89 09/02/2015 1207   Lab Results  Component Value Date   HGBA1C 5.3 07/23/2023   HGBA1C 5.9 03/13/2016   Lab Results  Component Value Date   INSULIN  9.5 06/25/2023   INSULIN  14.7 08/25/2018   Lab Results  Component Value Date   TSH 3.020 06/25/2023   CBC    Component Value Date/Time   WBC 5.5 06/25/2023 0945   WBC 5.8 10/14/2022 1422   RBC 4.33 06/25/2023 0945   RBC 4.21 10/14/2022 1422   HGB 13.0 06/25/2023 0945   HCT 40.4 06/25/2023 0945   PLT 297 06/25/2023 0945   MCV 93 06/25/2023 0945   MCH 30.0 06/25/2023 0945   MCH 31.4 07/21/2018 0304   MCHC 32.2 06/25/2023 0945   MCHC 33.7 10/14/2022 1422   RDW 12.2 06/25/2023 0945   Iron Studies    Component Value Date/Time   IRON 110 11/01/2019 0853   TIBC 382 11/01/2019 0853   IRONPCTSAT 29 11/01/2019 0853   Lipid Panel     Component  Value Date/Time   CHOL 192 06/25/2023 0945   TRIG 235 (H) 06/25/2023 0945   HDL 51 06/25/2023 0945   CHOLHDL 3.8 12/25/2020 1230   CHOLHDL 5 05/04/2018 0805   VLDL 78.4 (H) 05/04/2018 0805   LDLCALC 101 (H) 06/25/2023 0945   LDLDIRECT 96.0 05/04/2018 0805   Hepatic Function Panel     Component Value Date/Time   PROT 7.4 06/25/2023 0945   ALBUMIN 4.6 06/25/2023 0945   AST 19 06/25/2023 0945   ALT 17 06/25/2023 0945   ALKPHOS 97 06/25/2023 0945   BILITOT 0.5 06/25/2023 0945      Component Value Date/Time   TSH 3.020 06/25/2023 0945   Nutritional Lab Results  Component Value Date   VD25OH 62.6 06/25/2023   VD25OH 39.5 12/25/2022   VD25OH 40.8 06/12/2022      Assessment and Plan Assessment & Plan Obesity Following the category one eating plan approximately 60% of the time. Working on increasing intake of fruits and vegetables and meeting protein requirements. Ensuring regular meals and achieving at least seven hours of sleep most nights. Struggling with hydration. Aware of the need to avoid mindless eating, especially during her upcoming trip to the beach. - Increase adherence to the category one eating plan. - Enhance intake of fruits and vegetables. - Meet protein requirements. - Ensure regular meals and adequate sleep. - Improve hydration. - Practice mindful eating, particularly during travel.  Obstructive sleep apnea Zepbound  approved but cost prohibitive at $700 per month, potentially due to deductible issues. Considering changing supplement and drug plans to reduce costs. Good compliance with CPAP therapy, reporting usage in the high 90s. - Investigate deductible issues related to Zepbound  cost. - Discuss with insurance agent about changing supplement and drug plans. - Continue CPAP therapy.  Emotionally eating behaviors Increasing awareness of emotional eating behaviors and making efforts to be more mindful about food choices. Acknowledges tendency to eat mindlessly and is working on strategies to avoid this, such as savoring food and recognizing when she is no longer tasting it. Preparing for her trip to the beach by planning to have healthy snacks available and being mindful of eating habits. - Enhance mindfulness regarding food choices. - Implement strategies to avoid mindless eating, such as savoring food. - Plan for healthy snacks during travel.     Lovette was informed of the importance of frequent follow up visits to maximize her success with intensive lifestyle modifications for her obesity and obesity related health conditions as recommended by USPSTF and CMS guidelines   Louann Penton, MD

## 2023-10-19 ENCOUNTER — Other Ambulatory Visit: Payer: Self-pay | Admitting: Family Medicine

## 2023-10-19 DIAGNOSIS — R5383 Other fatigue: Secondary | ICD-10-CM

## 2023-10-19 DIAGNOSIS — F329 Major depressive disorder, single episode, unspecified: Secondary | ICD-10-CM

## 2023-10-27 ENCOUNTER — Ambulatory Visit: Payer: PRIVATE HEALTH INSURANCE | Admitting: Family Medicine

## 2023-11-01 ENCOUNTER — Other Ambulatory Visit: Payer: Self-pay | Admitting: Family Medicine

## 2023-11-01 DIAGNOSIS — E039 Hypothyroidism, unspecified: Secondary | ICD-10-CM

## 2023-11-01 DIAGNOSIS — E119 Type 2 diabetes mellitus without complications: Secondary | ICD-10-CM

## 2023-11-01 DIAGNOSIS — E782 Mixed hyperlipidemia: Secondary | ICD-10-CM

## 2023-11-12 ENCOUNTER — Encounter (INDEPENDENT_AMBULATORY_CARE_PROVIDER_SITE_OTHER): Payer: Self-pay | Admitting: Family Medicine

## 2023-11-12 ENCOUNTER — Ambulatory Visit (INDEPENDENT_AMBULATORY_CARE_PROVIDER_SITE_OTHER): Payer: Self-pay | Admitting: Family Medicine

## 2023-11-12 VITALS — BP 123/76 | HR 62 | Temp 98.2°F | Ht 62.0 in | Wt 189.0 lb

## 2023-11-12 DIAGNOSIS — Z6834 Body mass index (BMI) 34.0-34.9, adult: Secondary | ICD-10-CM

## 2023-11-12 DIAGNOSIS — E669 Obesity, unspecified: Secondary | ICD-10-CM | POA: Diagnosis not present

## 2023-11-12 DIAGNOSIS — E559 Vitamin D deficiency, unspecified: Secondary | ICD-10-CM | POA: Diagnosis not present

## 2023-11-12 DIAGNOSIS — R1012 Left upper quadrant pain: Secondary | ICD-10-CM | POA: Diagnosis not present

## 2023-11-12 MED ORDER — VITAMIN D (ERGOCALCIFEROL) 1.25 MG (50000 UNIT) PO CAPS: 50000.0000 [IU] | ORAL_CAPSULE | ORAL | 0 refills | Status: DC

## 2023-11-12 NOTE — Progress Notes (Signed)
 Office: [phone]  /  Fax: [phone]  WEIGHT SUMMARY AND BIOMETRICS  Anthropometric Measurements Height: 5' 2 (1.575 m) Weight: 189 lb (85.7 kg) BMI (Calculated): 34.56 Weight at Last Visit: 187 lb Weight Lost Since Last Visit: 0 Weight Gained Since Last Visit: 2 lb Starting Weight: 208 lb Total Weight Loss (lbs): 19 lb (8.618 kg) Peak Weight: 250 lb   Body Composition  Body Fat %: 45.7 % Fat Mass (lbs): 86.6 lbs Muscle Mass (lbs): 97.8 lbs Total Body Water  (lbs): 75.6 lbs Visceral Fat Rating : 14   Other Clinical Data Fasting: yes Labs: no Today's Visit #: 41 Starting Date: 08/25/18 Comments: cat 1    Chief Complaint: OBESITY   Discussed the use of AI scribe software for clinical note transcription with the patient, who gave verbal consent to proceed.  History of Present Illness Jennifer [name] is a 73 year old female who presents for obesity treatment and progress assessment.  She has been adhering to the category one eating plan about fifty percent of the time and has not been engaging in regular exercise. She has gained two pounds in the last month.  She experiences left upper quadrant pain, described as 'stabbing, cramping' and severe enough to double her over. This pain has persisted for a year. She has undergone two CT scans and a colonoscopy, all of which were normal. She has an upcoming appointment for microhematuria on November 21st and a follow-up with a urologist. She speculates whether the pain could be related to a fall she had a year ago, where she fell on her left side, or possibly due to scarring in her lungs noted on a CT scan.  She experiences heartburn and questions if she might have an ulcer. She has not had an EGD. The pain sometimes occurs after eating or sitting, and she is trying to identify any patterns related to her diet or posture.  She has a history of vitamin D  deficiency, which is currently managed with  Ergocalciferol  50,000 IU per week. She requests a refill of this medication.      PHYSICAL EXAM:  Blood pressure 123/76, pulse 62, temperature 98.2 F (36.8 C), height 5' 2 (1.575 m), weight 189 lb (85.7 kg), SpO2 94%. Body mass index is 34.57 kg/m.  DIAGNOSTIC DATA REVIEWED:  BMET    Component Value Date/Time   NA 139 06/25/2023 0945   K 4.4 06/25/2023 0945   CL 101 06/25/2023 0945   CO2 20 06/25/2023 0945   GLUCOSE 78 06/25/2023 0945   GLUCOSE 77 10/14/2022 1422   BUN 22 06/25/2023 0945   CREATININE 0.65 06/25/2023 0945   CREATININE 0.72 09/02/2015 1207   CALCIUM  10.2 06/25/2023 0945   GFRNONAA 84 01/17/2020 1106   GFRNONAA 89 09/02/2015 1207   GFRAA 96 01/17/2020 1106   GFRAA >89 09/02/2015 1207   Lab Results  Component Value Date   HGBA1C 5.3 07/23/2023   HGBA1C 5.9 03/13/2016   Lab Results  Component Value Date   INSULIN  9.5 06/25/2023   INSULIN  14.7 08/25/2018   Lab Results  Component Value Date   TSH 3.020 06/25/2023   CBC    Component Value Date/Time   WBC 5.5 06/25/2023 0945   WBC 5.8 10/14/2022 1422   RBC 4.33 06/25/2023 0945   RBC 4.21 10/14/2022 1422   HGB 13.0 06/25/2023 0945   HCT 40.4 06/25/2023 0945   PLT 297 06/25/2023 0945   MCV 93 06/25/2023 0945   MCH 30.0 06/25/2023  0945   MCH 31.4 07/21/2018 0304   MCHC 32.2 06/25/2023 0945   MCHC 33.7 10/14/2022 1422   RDW 12.2 06/25/2023 0945   Iron Studies    Component Value Date/Time   IRON 110 11/01/2019 0853   TIBC 382 11/01/2019 0853   IRONPCTSAT 29 11/01/2019 0853   Lipid Panel     Component Value Date/Time   CHOL 192 06/25/2023 0945   TRIG 235 (H) 06/25/2023 0945   HDL 51 06/25/2023 0945   CHOLHDL 3.8 12/25/2020 1230   CHOLHDL 5 05/04/2018 0805   VLDL 78.4 (H) 05/04/2018 0805   LDLCALC 101 (H) 06/25/2023 0945   LDLDIRECT 96.0 05/04/2018 0805   Hepatic Function Panel     Component Value Date/Time   PROT 7.4 06/25/2023 0945   ALBUMIN 4.6 06/25/2023 0945   AST 19  06/25/2023 0945   ALT 17 06/25/2023 0945   ALKPHOS 97 06/25/2023 0945   BILITOT 0.5 06/25/2023 0945      Component Value Date/Time   TSH 3.020 06/25/2023 0945   Nutritional Lab Results  Component Value Date   VD25OH 62.6 06/25/2023   VD25OH 39.5 12/25/2022   VD25OH 40.8 06/12/2022     Assessment and Plan Assessment & Plan Obesity  Obesity management is ongoing. She adheres to the category one eating plan about 50% of the time and has gained two pounds in the last month. She is not currently exercising and acknowledges the need to incorporate physical activity to maintain joint mobility and flexibility. - Encouraged adherence to the category one eating plan. - Encouraged initiation of a regular exercise routine, starting with short sessions like Jennifer [name] for 10 minutes.  Left upper quadrant abdominal pain Intermittent left upper quadrant abdominal pain, described as stabbing and cramping, occasionally severe enough to double her over. Previous CT scans and colonoscopy were performed. Differential diagnosis includes possible ulcer or scarring from a past fall. Pain may be related to diaphragm movement affecting lung scarring. She is scheduled for further evaluation with GI and urology. - Continue with scheduled appointments with GI and urology for further evaluation. - Consider dietary modifications such as avoiding carbonated beverages or reducing meal volume to identify potential triggers.  Vitamin D  deficiency, controlled on supplementation Vitamin D  deficiency is well-controlled with ergocalciferol  50,000 IU weekly. She requests a refill of her prescription. - Refilled ergocalciferol  50,000 IU weekly. - Will check vitamin D  levels in four weeks.     Jennifer [name] was counseled on the importance of maintaining healthy lifestyle habits, including balanced nutrition, regular physical activity, and behavioral modifications, while taking antiobesity medication.  Patient verbalized  understanding that medication is an adjunct to, not a replacement for, lifestyle changes and that the long-term success and weight maintenance depend on continued adherence to these strategies.   Jennifer [name] was informed of the importance of frequent follow up visits to maximize her success with intensive lifestyle modifications for her obesity and obesity related health conditions as recommended by USPSTF and CMS guidelines   Louann Penton, MD

## 2023-11-18 ENCOUNTER — Encounter: Payer: Self-pay | Admitting: Neurology

## 2023-11-28 DIAGNOSIS — R3914 Feeling of incomplete bladder emptying: Secondary | ICD-10-CM | POA: Diagnosis not present

## 2023-11-28 DIAGNOSIS — R3121 Asymptomatic microscopic hematuria: Secondary | ICD-10-CM | POA: Diagnosis not present

## 2023-12-02 ENCOUNTER — Telehealth: Payer: Medicare Other | Admitting: Adult Health

## 2023-12-06 DIAGNOSIS — Z23 Encounter for immunization: Secondary | ICD-10-CM | POA: Diagnosis not present

## 2023-12-08 ENCOUNTER — Ambulatory Visit: Payer: PRIVATE HEALTH INSURANCE | Admitting: Family Medicine

## 2023-12-10 ENCOUNTER — Ambulatory Visit (INDEPENDENT_AMBULATORY_CARE_PROVIDER_SITE_OTHER): Payer: Self-pay | Admitting: Family Medicine

## 2023-12-10 ENCOUNTER — Encounter (INDEPENDENT_AMBULATORY_CARE_PROVIDER_SITE_OTHER): Payer: Self-pay | Admitting: Family Medicine

## 2023-12-10 VITALS — BP 136/77 | HR 57 | Temp 97.9°F | Ht 62.0 in | Wt 188.0 lb

## 2023-12-10 DIAGNOSIS — G4733 Obstructive sleep apnea (adult) (pediatric): Secondary | ICD-10-CM

## 2023-12-10 DIAGNOSIS — E669 Obesity, unspecified: Secondary | ICD-10-CM | POA: Diagnosis not present

## 2023-12-10 DIAGNOSIS — Z6834 Body mass index (BMI) 34.0-34.9, adult: Secondary | ICD-10-CM | POA: Diagnosis not present

## 2023-12-10 DIAGNOSIS — E782 Mixed hyperlipidemia: Secondary | ICD-10-CM

## 2023-12-10 DIAGNOSIS — R7303 Prediabetes: Secondary | ICD-10-CM | POA: Diagnosis not present

## 2023-12-10 DIAGNOSIS — E559 Vitamin D deficiency, unspecified: Secondary | ICD-10-CM | POA: Diagnosis not present

## 2023-12-10 DIAGNOSIS — E039 Hypothyroidism, unspecified: Secondary | ICD-10-CM | POA: Diagnosis not present

## 2023-12-10 NOTE — Progress Notes (Signed)
 Office: [phone]  /  Fax: [phone]  WEIGHT SUMMARY AND BIOMETRICS  Anthropometric Measurements Height: 5' 2 (1.575 m) Weight: 188 lb (85.3 kg) BMI (Calculated): 34.38 Weight at Last Visit: 189 lb Weight Lost Since Last Visit: 1 lb Weight Gained Since Last Visit: 0 Starting Weight: 208 lb Total Weight Loss (lbs): 20 lb (9.072 kg) Peak Weight: 250 lb   Body Composition  Body Fat %: 45.2 % Fat Mass (lbs): 85.2 lbs Muscle Mass (lbs): 98.2 lbs Total Body Water  (lbs): 73.8 lbs Visceral Fat Rating : 14   Other Clinical Data Fasting: yes Labs: no Today's Visit #: 42 Starting Date: 08/25/18 Comments: cat 1    Chief Complaint: OBESITY    History of Present Illness Jennifer [name] is a 73 year old female who presents for obesity treatment follow-up and progress assessment.  She is adhering to the category one eating plan approximately 75% of the time, focusing on protein intake and increasing consumption of whole foods, such as fruits and vegetables. She is not skipping meals and generally sleeps seven to nine hours per night. She is not currently engaging in any exercise. Over the last month, she has lost one pound, which she considers significant given the Thanksgiving holiday.  She participates in a weight management program at the [facility] called 'maintain, don't gain,' where she weighs in every Monday to track her progress. She reports a weight loss of 0.4 pounds during this period.  She has recently received a flu shot and feels achy and tired, attributing these symptoms to the immunization. She typically does not experience reactions to vaccines but has felt these symptoms since receiving the shot on Saturday.  She has a history of microscopic hematuria. Previous tests have shown no blood in her urine, but she has experienced left-sided pain, initially thought to be a urinary tract infection. She has had microscopic hematuria for years, with prior  tests unable to identify a cause.  She uses a CPAP machine for sleep apnea and generally sleeps well. However, she occasionally removes the mask during the night, affecting her CPAP usage scores. She suspects that dry air in her home might be contributing to this issue.      PHYSICAL EXAM:  Blood pressure 136/77, pulse (!) 57, temperature 97.9 F (36.6 C), height 5' 2 (1.575 m), weight 188 lb (85.3 kg), SpO2 95%. Body mass index is 34.39 kg/m.  DIAGNOSTIC DATA REVIEWED:  BMET    Component Value Date/Time   NA 139 06/25/2023 0945   K 4.4 06/25/2023 0945   CL 101 06/25/2023 0945   CO2 20 06/25/2023 0945   GLUCOSE 78 06/25/2023 0945   GLUCOSE 77 10/14/2022 1422   BUN 22 06/25/2023 0945   CREATININE 0.65 06/25/2023 0945   CREATININE 0.72 09/02/2015 1207   CALCIUM  10.2 06/25/2023 0945   GFRNONAA 84 01/17/2020 1106   GFRNONAA 89 09/02/2015 1207   GFRAA 96 01/17/2020 1106   GFRAA >89 09/02/2015 1207   Lab Results  Component Value Date   HGBA1C 5.3 07/23/2023   HGBA1C 5.9 03/13/2016   Lab Results  Component Value Date   INSULIN  9.5 06/25/2023   INSULIN  14.7 08/25/2018   Lab Results  Component Value Date   TSH 3.020 06/25/2023   CBC    Component Value Date/Time   WBC 5.5 06/25/2023 0945   WBC 5.8 10/14/2022 1422   RBC 4.33 06/25/2023 0945   RBC 4.21 10/14/2022 1422   HGB 13.0 06/25/2023 0945  HCT 40.4 06/25/2023 0945   PLT 297 06/25/2023 0945   MCV 93 06/25/2023 0945   MCH 30.0 06/25/2023 0945   MCH 31.4 07/21/2018 0304   MCHC 32.2 06/25/2023 0945   MCHC 33.7 10/14/2022 1422   RDW 12.2 06/25/2023 0945   Iron Studies    Component Value Date/Time   IRON 110 11/01/2019 0853   TIBC 382 11/01/2019 0853   IRONPCTSAT 29 11/01/2019 0853   Lipid Panel     Component Value Date/Time   CHOL 192 06/25/2023 0945   TRIG 235 (H) 06/25/2023 0945   HDL 51 06/25/2023 0945   CHOLHDL 3.8 12/25/2020 1230   CHOLHDL 5 05/04/2018 0805   VLDL 78.4 (H) 05/04/2018 0805    LDLCALC 101 (H) 06/25/2023 0945   LDLDIRECT 96.0 05/04/2018 0805   Hepatic Function Panel     Component Value Date/Time   PROT 7.4 06/25/2023 0945   ALBUMIN 4.6 06/25/2023 0945   AST 19 06/25/2023 0945   ALT 17 06/25/2023 0945   ALKPHOS 97 06/25/2023 0945   BILITOT 0.5 06/25/2023 0945      Component Value Date/Time   TSH 3.020 06/25/2023 0945   Nutritional Lab Results  Component Value Date   VD25OH 62.6 06/25/2023   VD25OH 39.5 12/25/2022   VD25OH 40.8 06/12/2022     Assessment and Plan Assessment & Plan Obesity Management is ongoing with a focus on maintaining weight. She has lost one pound over the last month, which is notable given the Thanksgiving period. She is following the category one eating plan about 75% of the time, focusing on protein intake and whole foods. She is not currently exercising but [facility]. She is not skipping meals and generally gets 7-9 hours of sleep per night. - Continue category one eating plan - Encouraged participation in 'maintain, don't gain' program  Mixed hyperlipidemia Blood work will be conducted to assess cholesterol levels, including triglycerides and LDLs. - Ordered blood work to assess cholesterol levels  Prediabetes Blood work will be conducted to assess A1c and insulin  levels. - Ordered blood work to assess A1c and insulin  levels  Vitamin D  deficiency Blood work will be conducted to assess vitamin D  levels. - Ordered blood work to assess vitamin D  levels  Obstructive sleep apnea Managed with CPAP therapy. She reports occasional issues with CPAP adherence, possibly due to dry air in the house. She is considering setting up a humidifier to address dryness and improve CPAP adherence. - Encouraged use of CPAP therapy - Recommended setting up a humidifier in the bedroom  Hypothyroid On synthroid , due for labs -Check labs today -  Continue levothyroxine  50 mcg daily      Patients who are on anti-obesity medications are counseled on the importance of maintaining healthy lifestyle habits, including balanced nutrition, regular physical activity, and behavioral modifications,  Medication is an adjunct to, not a replacement for, lifestyle changes and that the long-term success and weight maintenance depend on continued adherence to these strategies.   Jennifer [name] was informed of the importance of frequent follow up visits to maximize her success with intensive lifestyle modifications for her obesity and obesity related health conditions as recommended by USPSTF and CMS guidelines   Louann Penton, MD

## 2023-12-11 LAB — CMP14+EGFR
ALT: 20 IU/L (ref 0–32)
AST: 16 IU/L (ref 0–40)
Albumin: 4.7 g/dL (ref 3.8–4.8)
Alkaline Phosphatase: 92 IU/L (ref 49–135)
BUN/Creatinine Ratio: 28 (ref 12–28)
BUN: 21 mg/dL (ref 8–27)
Bilirubin Total: 0.5 mg/dL (ref 0.0–1.2)
CO2: 21 mmol/L (ref 20–29)
Calcium: 10.2 mg/dL (ref 8.7–10.3)
Chloride: 99 mmol/L (ref 96–106)
Creatinine, Ser: 0.75 mg/dL (ref 0.57–1.00)
Globulin, Total: 2.7 g/dL (ref 1.5–4.5)
Glucose: 83 mg/dL (ref 70–99)
Potassium: 5 mmol/L (ref 3.5–5.2)
Sodium: 137 mmol/L (ref 134–144)
Total Protein: 7.4 g/dL (ref 6.0–8.5)
eGFR: 85 mL/min/1.73 (ref >59)

## 2023-12-11 LAB — LIPID PANEL WITH LDL/HDL RATIO
Cholesterol, Total: 185 mg/dL (ref 100–199)
HDL: 52 mg/dL (ref >39)
LDL Chol Calc (NIH): 103 mg/dL — ABNORMAL HIGH (ref 0–99)
LDL/HDL Ratio: 2 ratio (ref 0.0–3.2)
Triglycerides: 174 mg/dL — ABNORMAL HIGH (ref 0–149)
VLDL Cholesterol Cal: 30 mg/dL (ref 5–40)

## 2023-12-11 LAB — INSULIN, RANDOM: INSULIN: 14.3 u[IU]/mL (ref 2.6–24.9)

## 2023-12-11 LAB — TSH: TSH: 2.53 u[IU]/mL (ref 0.450–4.500)

## 2023-12-11 LAB — HEMOGLOBIN A1C
Est. average glucose Bld gHb Est-mCnc: 111 mg/dL
Hgb A1c MFr Bld: 5.5 % (ref 4.8–5.6)

## 2023-12-11 LAB — VITAMIN D 25 HYDROXY (VIT D DEFICIENCY, FRACTURES): Vit D, 25-Hydroxy: 64.7 ng/mL (ref 30.0–100.0)

## 2023-12-17 ENCOUNTER — Other Ambulatory Visit: Payer: PRIVATE HEALTH INSURANCE

## 2023-12-17 DIAGNOSIS — Z006 Encounter for examination for normal comparison and control in clinical research program: Secondary | ICD-10-CM

## 2023-12-19 DIAGNOSIS — R3121 Asymptomatic microscopic hematuria: Secondary | ICD-10-CM | POA: Diagnosis not present

## 2023-12-28 LAB — GENECONNECT MOLECULAR SCREEN

## 2024-01-12 ENCOUNTER — Encounter: Payer: Self-pay | Admitting: Family Medicine

## 2024-01-12 ENCOUNTER — Ambulatory Visit: Payer: Self-pay | Admitting: *Deleted

## 2024-01-12 NOTE — Telephone Encounter (Signed)
 Appt tomorrow with Dr. Jerrell - FYI

## 2024-01-12 NOTE — Telephone Encounter (Signed)
 FYI Only or Action Required?: FYI only for provider: appointment scheduled on 01/13/24.  Patient was last seen in primary care on 12/10/2023 by Verdon Louann BIRCH, MD.  Called Nurse Triage reporting Cough.  Symptoms began several days ago.  Interventions attempted: OTC medications: severe cold and flu relief.  Symptoms are: gradually worsening.  Triage Disposition: See Physician Within 24 Hours  Patient/caregiver understands and will follow disposition?: yes   Copied from CRM #[id number]. Topic: Clinical - Red Word Triage >> Jan 12, 2024  2:19 PM Deaijah H wrote: Red Word that prompted transfer to Nurse Triage: Cold, fever (100.4), loss of taste, runny nose, headache that causes balance issues   ----------------------------------------------------------------------- From previous Reason for Contact - Scheduling: Patient/patient representative is calling to schedule an appointment. Refer to attachments for appointment information. Reason for Disposition  [1] Continuous (nonstop) coughing interferes with work or school AND [2] no improvement using cough treatment per Care Advice  Answer Assessment - Initial Assessment Questions 1. ONSET: When did the cough begin?      Wednesday/Thursday 2. SEVERITY: How bad is the cough today?      spells 3. SPUTUM: Describe the color of your sputum (e.g., none, dry cough; clear, white, yellow, green)     No- clear, pale yellow 4. HEMOPTYSIS: Are you coughing up any blood? If Yes, ask: How much? (e.g., flecks, streaks, tablespoons, etc.)     no 5. DIFFICULTY BREATHING: Are you having difficulty breathing? If Yes, ask: How bad is it? (e.g., mild, moderate, severe)      no 6. FEVER: Do you have a fever? If Yes, ask: What is your temperature, how was it measured, and when did it start?     100.4- TH, low grade today oral 7. CARDIAC HISTORY: Do you have any history of heart disease? (e.g., heart attack, congestive heart failure)       no 8. LUNG HISTORY: Do you have any history of lung disease?  (e.g., pulmonary embolus, asthma, emphysema)     no 9. PE RISK FACTORS: Do you have a history of blood clots? (or: recent major surgery, recent prolonged travel, bedridden)     Recent travel 10. OTHER SYMPTOMS: Do you have any other symptoms? (e.g., runny nose, wheezing, chest pain)       Nasal congestion  12. TRAVEL: Have you traveled out of the country in the last month? (e.g., travel history, exposures)       Recent air travel  Protocols used: Cough - Acute Productive-A-AH

## 2024-01-13 ENCOUNTER — Ambulatory Visit (INDEPENDENT_AMBULATORY_CARE_PROVIDER_SITE_OTHER): Admitting: Student in an Organized Health Care Education/Training Program

## 2024-01-13 VITALS — BP 155/81 | HR 58 | Wt 192.0 lb

## 2024-01-13 DIAGNOSIS — J069 Acute upper respiratory infection, unspecified: Secondary | ICD-10-CM | POA: Diagnosis not present

## 2024-01-13 LAB — POCT INFLUENZA A/B
Influenza A, POC: NEGATIVE
Influenza B, POC: NEGATIVE

## 2024-01-13 LAB — POC COVID19 BINAXNOW: SARS Coronavirus 2 Ag: NEGATIVE

## 2024-01-13 NOTE — Patient Instructions (Signed)
" °  VISIT SUMMARY: Jennifer [name], a 74 year old female, visited today due to symptoms of a viral upper respiratory tract infection that began after a recent trip. She has been experiencing a dry cough, nasal congestion, fever, dizziness, and eye discomfort. Her symptoms are gradually improving.  YOUR PLAN: -ACUTE UPPER RESPIRATORY INFECTION: You have a viral upper respiratory infection, which is an infection of your nose and throat. It is likely caused by a virus such as parainfluenza or rhinovirus. Your symptoms are improving, and this condition usually resolves on its own. For relief, take acetaminophen  for muscle aches, stay hydrated, and get plenty of rest. Use intranasal fluticasone to help with congestion and perform sinus rinsing with Medipod or NeilMed. Keep monitoring your symptoms and contact us  if they worsen.  -DRY EYE SYNDROME: Dry eye syndrome occurs when your eyes do not produce enough tears or the right quality of tears. This can cause a gritty and painful feeling in your eyes. Stop using Clear Eyes drops and instead use artificial tears to keep your eyes moist. If needed, you can also use Pataday for allergic conjunctivitis.  INSTRUCTIONS: Please monitor your symptoms and contact us  if they worsen. Continue with the supportive care measures discussed, and follow up if you have any concerns or if your symptoms do not improve.    "

## 2024-01-13 NOTE — Assessment & Plan Note (Signed)
 The infection is likely viral, possibly parainfluenza or rhinovirus, most consistent with upper respiratory tract infection. Symptoms are improving and the condition is self-limiting.  No signs and exam of otitis media, sinusitis, or pneumonia.  No benefit to antibiotics at this point.  Supportive care includes acetaminophen  for myalgia, hydration, and rest. Use intranasal fluticasone for congestion and perform sinus rinsing with Medipod or NeilMed. Monitor symptoms and seek contact if she worsens. Her eyes feel gritty and painful, possibly due to nose blowing and CPAP use. Discontinue Clear Eyes drops. Use artificial tears and consider Pataday for allergic conjunctivitis if needed.

## 2024-01-13 NOTE — Progress Notes (Signed)
 "  Acute Office Visit  Patient ID: Jennifer [name], female    DOB: 1950/08/13, 73 y.o.   MRN: [id number]  PCP: Levora Reyes SAUNDERS, MD  Chief Complaint  Patient presents with   Cough     Cold, fever (100.4), loss of taste, runny nose, headache that causes balance issues    Subjective:     HPI  Discussed the use of AI scribe software for clinical note transcription with the patient, who gave verbal consent to proceed.  History of Present Illness Jennifer [name] is a 74 year old female who presents with symptoms of a viral upper respiratory tract infection.  She has been experiencing cough, congestion, and other symptoms since Thursday of last week, following a trip to [place]  and [place] . Initially, she felt fine upon returning home on Tuesday, but symptoms began on Wednesday night into Thursday.  She describes a cough that was initially deep and frequent but has since lessened. The cough is dry and particularly bothersome at night, causing her to wake up and have difficulty settling back down. No sputum is produced with the cough.  She has experienced a fever, last noted to be 100.80F yesterday, but she does not currently feel febrile. Significant nasal congestion is present, with frequent nose blowing leading to some blood in the nasal discharge. No significant sinus pain is reported, but she experiences dizziness and balance issues, which she attributes to sinus congestion.  She experienced left ear pain yesterday, which has since resolved. No shortness of breath, nausea, vomiting, or diarrhea is reported. She experienced a loss of taste and smell a few days into her illness, which has since improved as she was able to smell a candle recently.  She notes a lack of appetite, which she finds unusual, but she is able to maintain hydration. Eye discomfort is present, described as a gritty feeling, and she has been using over-the-counter eye drops for relief.       Objective:    BP (!) 155/81   Pulse (!) 58   Wt 192 lb (87.1 kg)   LMP  (LMP Unknown)   BMI 35.12 kg/m   Physical Exam  Gen: Well-appearing woman Ears: Clear canals, normal tympanic membranes with no erythema and normal light reflex Mouth: No posterior oropharynx lesions or erythema Neck: No tender cervical adenopathy Heart: Regular, 2 out of 6 early stock murmur best heard at the right upper sternal border Lungs: Unlabored, clear throughout without crackles or wheezing Ext: Warm, no edema  Results for orders placed or performed in visit on 01/13/24  POCT Influenza A/B  Result Value Ref Range   Influenza A, POC Negative Negative   Influenza B, POC Negative Negative  POC COVID-19  Result Value Ref Range   SARS Coronavirus 2 Ag Negative Negative       Assessment & Plan:   Problem List Items Addressed This Visit       Unprioritized   URTI (acute upper respiratory infection) - Primary   The infection is likely viral, possibly parainfluenza or rhinovirus, most consistent with upper respiratory tract infection. Symptoms are improving and the condition is self-limiting.  No signs and exam of otitis media, sinusitis, or pneumonia.  No benefit to antibiotics at this point.  Supportive care includes acetaminophen  for myalgia, hydration, and rest. Use intranasal fluticasone for congestion and perform sinus rinsing with Medipod or NeilMed. Monitor symptoms and seek contact if she worsens. Her eyes feel gritty and painful, possibly due to nose  blowing and CPAP use. Discontinue Clear Eyes drops. Use artificial tears and consider Pataday for allergic conjunctivitis if needed.      Relevant Orders   POCT Influenza A/B (Completed)   POC COVID-19 (Completed)    Return if symptoms worsen or fail to improve.  Cleatus Debby Specking, MD [facility]   "

## 2024-01-14 ENCOUNTER — Ambulatory Visit (INDEPENDENT_AMBULATORY_CARE_PROVIDER_SITE_OTHER): Payer: PRIVATE HEALTH INSURANCE | Admitting: Family Medicine
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
## 2024-01-16 ENCOUNTER — Ambulatory Visit (INDEPENDENT_AMBULATORY_CARE_PROVIDER_SITE_OTHER): Payer: Medicare Other | Admitting: Neurology

## 2024-01-16 ENCOUNTER — Encounter: Payer: Self-pay | Admitting: Neurology

## 2024-01-16 VITALS — BP 136/72 | HR 65 | Ht 62.0 in | Wt 193.0 lb

## 2024-01-16 DIAGNOSIS — G40009 Localization-related (focal) (partial) idiopathic epilepsy and epileptic syndromes with seizures of localized onset, not intractable, without status epilepticus: Secondary | ICD-10-CM | POA: Diagnosis not present

## 2024-01-16 MED ORDER — LEVETIRACETAM 500 MG PO TABS: 500.0000 mg | ORAL_TABLET | Freq: Two times a day (BID) | ORAL | 4 refills | Status: AC
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
## 2024-01-26 ENCOUNTER — Encounter: Payer: Self-pay | Admitting: Adult Health

## 2024-01-27 ENCOUNTER — Ambulatory Visit: Admitting: Adult Health

## 2024-01-28 NOTE — Telephone Encounter (Signed)
 Please review DL

## 2024-01-30 ENCOUNTER — Ambulatory Visit: Admitting: Adult Health

## 2024-02-02 ENCOUNTER — Ambulatory Visit: Admitting: Adult Health

## 2024-02-09 ENCOUNTER — Telehealth (INDEPENDENT_AMBULATORY_CARE_PROVIDER_SITE_OTHER): Payer: PRIVATE HEALTH INSURANCE | Admitting: Family Medicine

## 2024-02-09 ENCOUNTER — Encounter (INDEPENDENT_AMBULATORY_CARE_PROVIDER_SITE_OTHER): Payer: Self-pay | Admitting: Family Medicine

## 2024-02-09 VITALS — Ht 62.0 in | Wt 188.0 lb

## 2024-02-09 DIAGNOSIS — G4733 Obstructive sleep apnea (adult) (pediatric): Secondary | ICD-10-CM

## 2024-02-09 DIAGNOSIS — E559 Vitamin D deficiency, unspecified: Secondary | ICD-10-CM

## 2024-02-09 DIAGNOSIS — E669 Obesity, unspecified: Secondary | ICD-10-CM | POA: Diagnosis not present

## 2024-02-09 DIAGNOSIS — Z6834 Body mass index (BMI) 34.0-34.9, adult: Secondary | ICD-10-CM | POA: Diagnosis not present

## 2024-02-09 MED ORDER — VITAMIN D (ERGOCALCIFEROL) 1.25 MG (50000 UNIT) PO CAPS: 50000.0000 [IU] | ORAL_CAPSULE | ORAL | 0 refills | Status: AC

## 2024-02-09 MED ORDER — ZEPBOUND 2.5 MG/0.5ML ~~LOC~~ SOAJ: 2.5000 mg | SUBCUTANEOUS | 0 refills | Status: AC

## 2024-02-11 ENCOUNTER — Telehealth (INDEPENDENT_AMBULATORY_CARE_PROVIDER_SITE_OTHER): Payer: Self-pay

## 2024-02-11 NOTE — Telephone Encounter (Signed)
[employer] has not yet replied to your PA request. You may close this dialog, return to your dashboard, and perform other tasks.  To check for an update later, open this request again from your dashboard.  If [employer] has not replied to your request within 24 hours for expedited requests and 72 hours for standard requests please contact [employer] at [phone] .

## 2024-02-11 NOTE — Telephone Encounter (Signed)
 Prior shara has been submitted for Zepbound  2.5 mg for OSA.

## 2024-02-12 ENCOUNTER — Other Ambulatory Visit (HOSPITAL_COMMUNITY): Payer: Self-pay

## 2024-02-12 NOTE — Telephone Encounter (Signed)
 Prior authorization has been denied.  Patient has not tried and failed the formulary alternatives.

## 2024-03-17 ENCOUNTER — Ambulatory Visit (INDEPENDENT_AMBULATORY_CARE_PROVIDER_SITE_OTHER): Admitting: Family Medicine

## 2024-03-31 ENCOUNTER — Ambulatory Visit: Admitting: Adult Health

## 2025-01-17 ENCOUNTER — Ambulatory Visit: Payer: Self-pay | Admitting: Neurology
